# Patient Record
Sex: Female | Born: 1935 | Race: White | Hispanic: No | Smoking: Never smoker
Health system: Southern US, Community
[De-identification: ages and names within clinical notes are randomized; demographics above are authoritative.]

## PROBLEM LIST (undated history)

## (undated) DIAGNOSIS — M8580 Other specified disorders of bone density and structure, unspecified site: Secondary | ICD-10-CM

## (undated) DIAGNOSIS — R112 Nausea with vomiting, unspecified: Secondary | ICD-10-CM

## (undated) DIAGNOSIS — Z9889 Other specified postprocedural states: Secondary | ICD-10-CM

## (undated) DIAGNOSIS — E785 Hyperlipidemia, unspecified: Secondary | ICD-10-CM

## (undated) DIAGNOSIS — I10 Essential (primary) hypertension: Secondary | ICD-10-CM

## (undated) DIAGNOSIS — T8859XA Other complications of anesthesia, initial encounter: Secondary | ICD-10-CM

## (undated) DIAGNOSIS — R600 Localized edema: Secondary | ICD-10-CM

## (undated) DIAGNOSIS — M1990 Unspecified osteoarthritis, unspecified site: Secondary | ICD-10-CM

## (undated) DIAGNOSIS — R569 Unspecified convulsions: Secondary | ICD-10-CM

## (undated) DIAGNOSIS — K219 Gastro-esophageal reflux disease without esophagitis: Secondary | ICD-10-CM

## (undated) DIAGNOSIS — M722 Plantar fascial fibromatosis: Secondary | ICD-10-CM

## (undated) DIAGNOSIS — G8929 Other chronic pain: Secondary | ICD-10-CM

## (undated) DIAGNOSIS — M79673 Pain in unspecified foot: Secondary | ICD-10-CM

## (undated) DIAGNOSIS — N951 Menopausal and female climacteric states: Secondary | ICD-10-CM

## (undated) DIAGNOSIS — S82402A Unspecified fracture of shaft of left fibula, initial encounter for closed fracture: Secondary | ICD-10-CM

## (undated) DIAGNOSIS — T4145XA Adverse effect of unspecified anesthetic, initial encounter: Secondary | ICD-10-CM

## (undated) DIAGNOSIS — IMO0002 Reserved for concepts with insufficient information to code with codable children: Secondary | ICD-10-CM

## (undated) HISTORY — DX: Pain in unspecified foot: M79.673

## (undated) HISTORY — DX: Other chronic pain: G89.29

## (undated) HISTORY — PX: BACK SURGERY: SHX140

## (undated) HISTORY — DX: Other specified disorders of bone density and structure, unspecified site: M85.80

## (undated) HISTORY — DX: Hyperlipidemia, unspecified: E78.5

## (undated) HISTORY — PX: OTHER SURGICAL HISTORY: SHX169

## (undated) HISTORY — DX: Essential (primary) hypertension: I10

## (undated) HISTORY — PX: BLADDER SUSPENSION: SHX72

## (undated) HISTORY — DX: Reserved for concepts with insufficient information to code with codable children: IMO0002

## (undated) HISTORY — DX: Localized edema: R60.0

## (undated) HISTORY — DX: Unspecified convulsions: R56.9

## (undated) HISTORY — DX: Plantar fascial fibromatosis: M72.2

## (undated) HISTORY — DX: Unspecified fracture of shaft of left fibula, initial encounter for closed fracture: S82.402A

## (undated) HISTORY — DX: Menopausal and female climacteric states: N95.1

## (undated) HISTORY — DX: Unspecified osteoarthritis, unspecified site: M19.90

---

## 1968-05-31 HISTORY — PX: PARTIAL HYSTERECTOMY: SHX80

## 1992-05-31 DIAGNOSIS — E785 Hyperlipidemia, unspecified: Secondary | ICD-10-CM

## 1992-05-31 HISTORY — DX: Hyperlipidemia, unspecified: E78.5

## 1998-03-31 LAB — HM COLONOSCOPY: HM Colonoscopy: NEGATIVE

## 1999-02-10 ENCOUNTER — Other Ambulatory Visit: Admission: RE | Admit: 1999-02-10 | Discharge: 1999-02-10 | Payer: Self-pay | Admitting: Family Medicine

## 1999-06-01 DIAGNOSIS — IMO0002 Reserved for concepts with insufficient information to code with codable children: Secondary | ICD-10-CM

## 1999-06-01 HISTORY — DX: Reserved for concepts with insufficient information to code with codable children: IMO0002

## 1999-06-26 ENCOUNTER — Encounter: Payer: Self-pay | Admitting: Urology

## 1999-06-30 HISTORY — PX: OTHER SURGICAL HISTORY: SHX169

## 1999-07-01 ENCOUNTER — Inpatient Hospital Stay (HOSPITAL_COMMUNITY): Admission: RE | Admit: 1999-07-01 | Discharge: 1999-07-04 | Payer: Self-pay | Admitting: Urology

## 1999-07-01 ENCOUNTER — Encounter (INDEPENDENT_AMBULATORY_CARE_PROVIDER_SITE_OTHER): Payer: Self-pay | Admitting: Specialist

## 1999-08-06 ENCOUNTER — Encounter: Admission: RE | Admit: 1999-08-06 | Discharge: 1999-08-06 | Payer: Self-pay | Admitting: Family Medicine

## 1999-08-06 ENCOUNTER — Encounter: Payer: Self-pay | Admitting: Family Medicine

## 1999-09-20 ENCOUNTER — Emergency Department (HOSPITAL_COMMUNITY): Admission: EM | Admit: 1999-09-20 | Discharge: 1999-09-21 | Payer: Self-pay

## 1999-11-29 DIAGNOSIS — M899 Disorder of bone, unspecified: Secondary | ICD-10-CM | POA: Insufficient documentation

## 1999-11-29 DIAGNOSIS — M949 Disorder of cartilage, unspecified: Secondary | ICD-10-CM

## 1999-11-29 DIAGNOSIS — M8580 Other specified disorders of bone density and structure, unspecified site: Secondary | ICD-10-CM

## 1999-11-29 HISTORY — DX: Other specified disorders of bone density and structure, unspecified site: M85.80

## 2000-05-31 DIAGNOSIS — I10 Essential (primary) hypertension: Secondary | ICD-10-CM

## 2000-05-31 HISTORY — DX: Essential (primary) hypertension: I10

## 2000-08-09 ENCOUNTER — Encounter: Payer: Self-pay | Admitting: Family Medicine

## 2000-08-09 ENCOUNTER — Encounter: Admission: RE | Admit: 2000-08-09 | Discharge: 2000-08-09 | Payer: Self-pay | Admitting: Family Medicine

## 2001-08-01 ENCOUNTER — Encounter: Payer: Self-pay | Admitting: Family Medicine

## 2001-08-01 ENCOUNTER — Encounter: Admission: RE | Admit: 2001-08-01 | Discharge: 2001-08-01 | Payer: Self-pay | Admitting: Family Medicine

## 2002-08-21 ENCOUNTER — Encounter: Admission: RE | Admit: 2002-08-21 | Discharge: 2002-08-21 | Payer: Self-pay | Admitting: Family Medicine

## 2002-08-21 ENCOUNTER — Encounter: Payer: Self-pay | Admitting: Family Medicine

## 2003-08-26 ENCOUNTER — Encounter: Admission: RE | Admit: 2003-08-26 | Discharge: 2003-08-26 | Payer: Self-pay | Admitting: Family Medicine

## 2004-03-31 ENCOUNTER — Encounter: Payer: Self-pay | Admitting: Family Medicine

## 2004-04-03 ENCOUNTER — Ambulatory Visit: Payer: Self-pay | Admitting: Family Medicine

## 2004-04-06 ENCOUNTER — Ambulatory Visit: Payer: Self-pay | Admitting: Family Medicine

## 2004-04-06 ENCOUNTER — Other Ambulatory Visit: Admission: RE | Admit: 2004-04-06 | Discharge: 2004-04-06 | Payer: Self-pay | Admitting: Family Medicine

## 2004-04-22 ENCOUNTER — Ambulatory Visit: Payer: Self-pay | Admitting: Family Medicine

## 2004-05-04 ENCOUNTER — Ambulatory Visit: Payer: Self-pay | Admitting: Internal Medicine

## 2004-05-13 ENCOUNTER — Ambulatory Visit: Payer: Self-pay | Admitting: Family Medicine

## 2004-05-31 LAB — HM DEXA SCAN

## 2004-08-12 ENCOUNTER — Ambulatory Visit: Payer: Self-pay | Admitting: Family Medicine

## 2004-09-07 ENCOUNTER — Encounter: Admission: RE | Admit: 2004-09-07 | Discharge: 2004-09-07 | Payer: Self-pay | Admitting: Family Medicine

## 2004-09-08 ENCOUNTER — Ambulatory Visit: Payer: Self-pay | Admitting: Family Medicine

## 2004-09-18 ENCOUNTER — Encounter: Admission: RE | Admit: 2004-09-18 | Discharge: 2004-09-18 | Payer: Self-pay | Admitting: Family Medicine

## 2005-03-31 ENCOUNTER — Ambulatory Visit: Payer: Self-pay | Admitting: Family Medicine

## 2005-06-10 ENCOUNTER — Ambulatory Visit: Payer: Self-pay | Admitting: Family Medicine

## 2005-07-20 ENCOUNTER — Ambulatory Visit: Payer: Self-pay | Admitting: Family Medicine

## 2005-09-13 ENCOUNTER — Encounter: Admission: RE | Admit: 2005-09-13 | Discharge: 2005-09-13 | Payer: Self-pay | Admitting: Family Medicine

## 2005-09-17 ENCOUNTER — Ambulatory Visit: Payer: Self-pay | Admitting: Family Medicine

## 2005-09-27 ENCOUNTER — Ambulatory Visit: Payer: Self-pay | Admitting: Family Medicine

## 2005-10-05 ENCOUNTER — Ambulatory Visit: Payer: Self-pay | Admitting: Family Medicine

## 2005-10-11 ENCOUNTER — Encounter: Admission: RE | Admit: 2005-10-11 | Discharge: 2005-10-11 | Payer: Self-pay | Admitting: Family Medicine

## 2005-11-01 ENCOUNTER — Ambulatory Visit: Payer: Self-pay | Admitting: Family Medicine

## 2006-03-01 ENCOUNTER — Ambulatory Visit: Payer: Self-pay | Admitting: Family Medicine

## 2006-06-17 ENCOUNTER — Ambulatory Visit: Payer: Self-pay | Admitting: Family Medicine

## 2006-07-11 ENCOUNTER — Ambulatory Visit: Payer: Self-pay | Admitting: Family Medicine

## 2006-07-18 ENCOUNTER — Ambulatory Visit: Payer: Self-pay | Admitting: Family Medicine

## 2006-07-19 ENCOUNTER — Ambulatory Visit: Payer: Self-pay | Admitting: Internal Medicine

## 2006-07-20 ENCOUNTER — Ambulatory Visit: Payer: Self-pay | Admitting: Cardiovascular Disease

## 2006-08-01 ENCOUNTER — Other Ambulatory Visit: Admission: RE | Admit: 2006-08-01 | Discharge: 2006-08-01 | Payer: Self-pay | Admitting: Obstetrics and Gynecology

## 2006-08-31 ENCOUNTER — Ambulatory Visit: Payer: Self-pay | Admitting: Family Medicine

## 2006-09-09 ENCOUNTER — Encounter: Payer: Self-pay | Admitting: Family Medicine

## 2006-09-12 DIAGNOSIS — K589 Irritable bowel syndrome without diarrhea: Secondary | ICD-10-CM | POA: Insufficient documentation

## 2006-09-12 DIAGNOSIS — Z9079 Acquired absence of other genital organ(s): Secondary | ICD-10-CM | POA: Insufficient documentation

## 2006-09-12 DIAGNOSIS — M1990 Unspecified osteoarthritis, unspecified site: Secondary | ICD-10-CM | POA: Insufficient documentation

## 2006-09-12 DIAGNOSIS — Z8719 Personal history of other diseases of the digestive system: Secondary | ICD-10-CM | POA: Insufficient documentation

## 2006-09-12 DIAGNOSIS — N6019 Diffuse cystic mastopathy of unspecified breast: Secondary | ICD-10-CM | POA: Insufficient documentation

## 2006-09-12 DIAGNOSIS — J301 Allergic rhinitis due to pollen: Secondary | ICD-10-CM

## 2006-09-19 ENCOUNTER — Ambulatory Visit: Payer: Self-pay | Admitting: Family Medicine

## 2006-09-29 ENCOUNTER — Encounter: Admission: RE | Admit: 2006-09-29 | Discharge: 2006-09-29 | Payer: Self-pay | Admitting: Family Medicine

## 2006-09-30 ENCOUNTER — Encounter: Payer: Self-pay | Admitting: Family Medicine

## 2006-10-05 ENCOUNTER — Encounter: Payer: Self-pay | Admitting: Family Medicine

## 2006-10-17 ENCOUNTER — Encounter: Payer: Self-pay | Admitting: Family Medicine

## 2006-12-05 ENCOUNTER — Encounter: Payer: Self-pay | Admitting: Family Medicine

## 2007-01-06 ENCOUNTER — Telehealth: Payer: Self-pay | Admitting: Family Medicine

## 2007-02-21 ENCOUNTER — Encounter: Payer: Self-pay | Admitting: Family Medicine

## 2007-03-06 ENCOUNTER — Ambulatory Visit: Payer: Self-pay | Admitting: Family Medicine

## 2007-03-06 DIAGNOSIS — R4589 Other symptoms and signs involving emotional state: Secondary | ICD-10-CM

## 2007-03-06 DIAGNOSIS — N9489 Other specified conditions associated with female genital organs and menstrual cycle: Secondary | ICD-10-CM

## 2007-03-06 DIAGNOSIS — M79609 Pain in unspecified limb: Secondary | ICD-10-CM

## 2007-03-09 ENCOUNTER — Encounter: Payer: Self-pay | Admitting: Family Medicine

## 2007-03-29 ENCOUNTER — Ambulatory Visit: Payer: Self-pay | Admitting: Obstetrics & Gynecology

## 2007-04-03 ENCOUNTER — Ambulatory Visit (HOSPITAL_COMMUNITY): Admission: RE | Admit: 2007-04-03 | Discharge: 2007-04-03 | Payer: Self-pay | Admitting: Gynecology

## 2007-04-17 ENCOUNTER — Ambulatory Visit: Payer: Self-pay | Admitting: Gynecology

## 2007-04-18 ENCOUNTER — Ambulatory Visit: Payer: Self-pay | Admitting: Family Medicine

## 2007-05-15 ENCOUNTER — Telehealth: Payer: Self-pay | Admitting: Family Medicine

## 2007-05-19 ENCOUNTER — Ambulatory Visit: Admission: RE | Admit: 2007-05-19 | Discharge: 2007-05-19 | Payer: Self-pay | Admitting: Gynecology

## 2007-05-30 ENCOUNTER — Telehealth: Payer: Self-pay | Admitting: Family Medicine

## 2007-06-06 ENCOUNTER — Ambulatory Visit: Payer: Self-pay | Admitting: Family Medicine

## 2007-06-07 ENCOUNTER — Telehealth: Payer: Self-pay | Admitting: Family Medicine

## 2007-06-09 LAB — CONVERTED CEMR LAB
BUN: 21 mg/dL (ref 6–23)
CO2: 30 meq/L (ref 19–32)
Calcium: 9.5 mg/dL (ref 8.4–10.5)
Chloride: 99 meq/L (ref 96–112)
Cholesterol: 435 mg/dL (ref 0–200)
Direct LDL: 324.8 mg/dL
Eosinophils Absolute: 0 10*3/uL (ref 0.0–0.6)
GFR calc Af Amer: 79 mL/min
GFR calc non Af Amer: 65 mL/min
Glucose, Bld: 107 mg/dL — ABNORMAL HIGH (ref 70–99)
HDL: 60.8 mg/dL (ref >39.0)
Lymphocytes Relative: 21.4 % (ref 12.0–46.0)
MCV: 94.4 fL (ref 78.0–100.0)
Monocytes Absolute: 0.7 10*3/uL (ref 0.2–0.7)
Monocytes Relative: 6.4 % (ref 3.0–11.0)
Phosphorus: 3.6 mg/dL (ref 2.3–4.6)
Platelets: 346 10*3/uL (ref 150–400)
Potassium: 3.8 meq/L (ref 3.5–5.1)
RBC: 4.36 M/uL (ref 3.87–5.11)
RDW: 12.7 % (ref 11.5–14.6)
Sodium: 137 meq/L (ref 135–145)
Triglycerides: 196 mg/dL — ABNORMAL HIGH (ref 0–149)
VLDL: 39 mg/dL (ref 0–40)

## 2007-06-18 ENCOUNTER — Emergency Department (HOSPITAL_COMMUNITY): Admission: EM | Admit: 2007-06-18 | Discharge: 2007-06-18 | Payer: Self-pay | Admitting: Emergency Medicine

## 2007-06-21 ENCOUNTER — Ambulatory Visit: Payer: Self-pay | Admitting: Family Medicine

## 2007-06-21 DIAGNOSIS — F329 Major depressive disorder, single episode, unspecified: Secondary | ICD-10-CM

## 2007-06-21 DIAGNOSIS — F419 Anxiety disorder, unspecified: Secondary | ICD-10-CM

## 2007-06-22 LAB — CONVERTED CEMR LAB
Basophils Absolute: 0 10*3/uL (ref 0.0–0.1)
Basophils Relative: 0.3 % (ref 0.0–1.0)
Calcium: 9.1 mg/dL (ref 8.4–10.5)
Eosinophils Absolute: 0.1 10*3/uL (ref 0.0–0.6)
Eosinophils Relative: 1.4 % (ref 0.0–5.0)
GFR calc non Af Amer: 58 mL/min
Glucose, Bld: 97 mg/dL (ref 70–99)
Hemoglobin: 14.7 g/dL (ref 12.0–15.0)
Lymphocytes Relative: 26.7 % (ref 12.0–46.0)
MCHC: 35.2 g/dL (ref 30.0–36.0)
Monocytes Absolute: 0.6 10*3/uL (ref 0.2–0.7)
Neutro Abs: 4.4 10*3/uL (ref 1.4–7.7)
Neutrophils Relative %: 62.4 % (ref 43.0–77.0)
Potassium: 3.2 meq/L — ABNORMAL LOW (ref 3.5–5.1)
WBC: 7 10*3/uL (ref 4.5–10.5)

## 2007-07-04 ENCOUNTER — Ambulatory Visit: Payer: Self-pay | Admitting: Family Medicine

## 2007-07-19 ENCOUNTER — Ambulatory Visit: Payer: Self-pay | Admitting: Family Medicine

## 2007-07-19 DIAGNOSIS — R609 Edema, unspecified: Secondary | ICD-10-CM

## 2007-07-20 LAB — CONVERTED CEMR LAB
ALT: 18 units/L (ref 0–35)
Albumin: 3.6 g/dL (ref 3.5–5.2)
Alkaline Phosphatase: 48 units/L (ref 39–117)
BUN: 15 mg/dL (ref 6–23)
Bilirubin, Direct: 0.1 mg/dL (ref 0.0–0.3)
Calcium: 9.5 mg/dL (ref 8.4–10.5)
GFR calc Af Amer: 70 mL/min
Phosphorus: 3.4 mg/dL (ref 2.3–4.6)
Potassium: 3.6 meq/L (ref 3.5–5.1)
Total Bilirubin: 0.5 mg/dL (ref 0.3–1.2)
Total Protein: 6.7 g/dL (ref 6.0–8.3)

## 2007-08-09 ENCOUNTER — Telehealth: Payer: Self-pay | Admitting: Family Medicine

## 2007-08-24 ENCOUNTER — Ambulatory Visit: Payer: Self-pay | Admitting: Family Medicine

## 2007-08-30 ENCOUNTER — Encounter (INDEPENDENT_AMBULATORY_CARE_PROVIDER_SITE_OTHER): Payer: Self-pay | Admitting: *Deleted

## 2007-08-30 ENCOUNTER — Encounter: Payer: Self-pay | Admitting: Family Medicine

## 2007-08-30 LAB — CONVERTED CEMR LAB
AST: 21 units/L (ref 0–37)
Cholesterol: 249 mg/dL (ref 0–200)
Direct LDL: 167.9 mg/dL
HDL: 53.6 mg/dL (ref >39.0)
Potassium: 3.2 meq/L — ABNORMAL LOW (ref 3.5–5.1)

## 2007-09-06 ENCOUNTER — Telehealth: Payer: Self-pay | Admitting: Family Medicine

## 2007-09-14 ENCOUNTER — Ambulatory Visit: Payer: Self-pay | Admitting: Family Medicine

## 2007-09-18 ENCOUNTER — Encounter: Payer: Self-pay | Admitting: Family Medicine

## 2007-09-20 ENCOUNTER — Encounter: Payer: Self-pay | Admitting: Family Medicine

## 2007-09-27 ENCOUNTER — Ambulatory Visit: Payer: Self-pay | Admitting: Family Medicine

## 2007-10-02 ENCOUNTER — Encounter: Admission: RE | Admit: 2007-10-02 | Discharge: 2007-10-02 | Payer: Self-pay | Admitting: Family Medicine

## 2007-10-04 ENCOUNTER — Encounter (INDEPENDENT_AMBULATORY_CARE_PROVIDER_SITE_OTHER): Payer: Self-pay | Admitting: *Deleted

## 2007-10-09 ENCOUNTER — Telehealth: Payer: Self-pay | Admitting: Family Medicine

## 2007-10-11 ENCOUNTER — Encounter: Admission: RE | Admit: 2007-10-11 | Discharge: 2007-10-11 | Payer: Self-pay | Admitting: Family Medicine

## 2007-10-16 ENCOUNTER — Ambulatory Visit: Payer: Self-pay | Admitting: Family Medicine

## 2007-10-19 LAB — CONVERTED CEMR LAB
AST: 26 units/L (ref 0–37)
Cholesterol: 261 mg/dL (ref 0–200)
Direct LDL: 170.6 mg/dL
HDL: 57.1 mg/dL (ref >39.0)
Potassium: 3.6 meq/L (ref 3.5–5.1)
Total CHOL/HDL Ratio: 4.6
Triglycerides: 203 mg/dL (ref 0–149)

## 2007-10-26 ENCOUNTER — Ambulatory Visit: Payer: Self-pay | Admitting: Family Medicine

## 2008-01-22 ENCOUNTER — Ambulatory Visit: Payer: Self-pay | Admitting: Family Medicine

## 2008-01-22 DIAGNOSIS — N951 Menopausal and female climacteric states: Secondary | ICD-10-CM

## 2008-03-04 ENCOUNTER — Ambulatory Visit: Payer: Self-pay | Admitting: Family Medicine

## 2008-04-09 ENCOUNTER — Telehealth (INDEPENDENT_AMBULATORY_CARE_PROVIDER_SITE_OTHER): Payer: Self-pay | Admitting: *Deleted

## 2008-04-17 ENCOUNTER — Ambulatory Visit: Payer: Self-pay | Admitting: Family Medicine

## 2008-04-18 LAB — CONVERTED CEMR LAB
AST: 31 units/L (ref 0–37)
Cholesterol: 259 mg/dL (ref 0–200)
VLDL: 26 mg/dL (ref 0–40)

## 2008-04-29 ENCOUNTER — Telehealth (INDEPENDENT_AMBULATORY_CARE_PROVIDER_SITE_OTHER): Payer: Self-pay | Admitting: *Deleted

## 2008-05-28 ENCOUNTER — Telehealth: Payer: Self-pay | Admitting: Family Medicine

## 2008-07-02 ENCOUNTER — Ambulatory Visit: Payer: Self-pay | Admitting: Family Medicine

## 2008-10-07 ENCOUNTER — Encounter: Admission: RE | Admit: 2008-10-07 | Discharge: 2008-10-07 | Payer: Self-pay | Admitting: Family Medicine

## 2008-10-08 ENCOUNTER — Ambulatory Visit: Payer: Self-pay | Admitting: Family Medicine

## 2008-10-08 DIAGNOSIS — E876 Hypokalemia: Secondary | ICD-10-CM

## 2008-10-09 ENCOUNTER — Encounter: Payer: Self-pay | Admitting: Family Medicine

## 2008-10-12 ENCOUNTER — Encounter (INDEPENDENT_AMBULATORY_CARE_PROVIDER_SITE_OTHER): Payer: Self-pay | Admitting: *Deleted

## 2008-10-14 LAB — CONVERTED CEMR LAB
Albumin: 3.5 g/dL (ref 3.5–5.2)
BUN: 19 mg/dL (ref 6–23)
Calcium: 9.5 mg/dL (ref 8.4–10.5)
Phosphorus: 4.2 mg/dL (ref 2.3–4.6)
Sodium: 143 meq/L (ref 135–145)

## 2009-02-22 ENCOUNTER — Telehealth: Payer: Self-pay | Admitting: Family Medicine

## 2009-03-04 ENCOUNTER — Ambulatory Visit: Payer: Self-pay | Admitting: Family Medicine

## 2009-03-05 ENCOUNTER — Telehealth: Payer: Self-pay | Admitting: Family Medicine

## 2009-03-12 ENCOUNTER — Ambulatory Visit: Payer: Self-pay | Admitting: Family Medicine

## 2009-03-12 DIAGNOSIS — R5381 Other malaise: Secondary | ICD-10-CM | POA: Insufficient documentation

## 2009-03-12 DIAGNOSIS — R5383 Other fatigue: Secondary | ICD-10-CM

## 2009-03-12 LAB — CONVERTED CEMR LAB
Ketones, urine, test strip: NEGATIVE
Nitrite: NEGATIVE
Specific Gravity, Urine: 1.015
Urobilinogen, UA: 0.2
pH: 6

## 2009-03-13 ENCOUNTER — Encounter: Payer: Self-pay | Admitting: Family Medicine

## 2009-03-13 LAB — CONVERTED CEMR LAB
Basophils Relative: 0.3 % (ref 0.0–3.0)
Calcium: 9.4 mg/dL (ref 8.4–10.5)
Creatinine, Ser: 0.9 mg/dL (ref 0.4–1.2)
Eosinophils Relative: 6.7 % — ABNORMAL HIGH (ref 0.0–5.0)
GFR calc non Af Amer: 64.71 mL/min (ref >60)
Glucose, Bld: 96 mg/dL (ref 70–99)
Hemoglobin: 12.3 g/dL (ref 12.0–15.0)
Lymphocytes Relative: 28.1 % (ref 12.0–46.0)
Monocytes Relative: 4 % (ref 3.0–12.0)
Neutrophils Relative %: 60.9 % (ref 43.0–77.0)
Platelets: 366 10*3/uL (ref 150.0–400.0)
RBC: 3.89 M/uL (ref 3.87–5.11)
TSH: 1.8 microintl units/mL (ref 0.35–5.50)
WBC: 8.8 10*3/uL (ref 4.5–10.5)

## 2009-04-03 ENCOUNTER — Telehealth: Payer: Self-pay | Admitting: Family Medicine

## 2009-04-21 ENCOUNTER — Ambulatory Visit: Payer: Self-pay | Admitting: Family Medicine

## 2009-05-29 ENCOUNTER — Telehealth: Payer: Self-pay | Admitting: Family Medicine

## 2009-05-29 ENCOUNTER — Ambulatory Visit: Payer: Self-pay | Admitting: Family Medicine

## 2009-06-18 ENCOUNTER — Encounter: Payer: Self-pay | Admitting: Family Medicine

## 2009-06-18 ENCOUNTER — Ambulatory Visit: Payer: Self-pay | Admitting: Family Medicine

## 2009-06-18 DIAGNOSIS — K219 Gastro-esophageal reflux disease without esophagitis: Secondary | ICD-10-CM

## 2009-06-18 DIAGNOSIS — N3941 Urge incontinence: Secondary | ICD-10-CM

## 2009-06-18 LAB — CONVERTED CEMR LAB
Bilirubin Urine: NEGATIVE
Glucose, Urine, Semiquant: NEGATIVE
Ketones, urine, test strip: NEGATIVE
Protein, U semiquant: NEGATIVE
Specific Gravity, Urine: <1.005
Urobilinogen, UA: 0.2

## 2009-06-19 LAB — CONVERTED CEMR LAB
ALT: 17 units/L (ref 0–35)
AST: 19 units/L (ref 0–37)
Alkaline Phosphatase: 53 units/L (ref 39–117)
Basophils Absolute: 0 10*3/uL (ref 0.0–0.1)
Bilirubin, Direct: 0.1 mg/dL (ref 0.0–0.3)
Chloride: 100 meq/L (ref 96–112)
Creatinine, Ser: 1.1 mg/dL (ref 0.4–1.2)
Eosinophils Absolute: 0.3 10*3/uL (ref 0.0–0.7)
Eosinophils Relative: 2.7 % (ref 0.0–5.0)
HCT: 38.2 % (ref 36.0–46.0)
Hemoglobin: 12.7 g/dL (ref 12.0–15.0)
Lymphs Abs: 0.5 10*3/uL — ABNORMAL LOW (ref 0.7–4.0)
MCHC: 33.2 g/dL (ref 30.0–36.0)
Monocytes Absolute: 0.3 10*3/uL (ref 0.1–1.0)
Monocytes Relative: 2.9 % — ABNORMAL LOW (ref 3.0–12.0)
RBC: 3.97 M/uL (ref 3.87–5.11)
RDW: 13.3 % (ref 11.5–14.6)
TSH: 4.2 microintl units/mL (ref 0.35–5.50)
Total Bilirubin: 0.8 mg/dL (ref 0.3–1.2)
WBC: 11.8 10*3/uL — ABNORMAL HIGH (ref 4.5–10.5)

## 2009-07-09 ENCOUNTER — Ambulatory Visit: Payer: Self-pay | Admitting: Family Medicine

## 2009-08-22 ENCOUNTER — Ambulatory Visit: Payer: Self-pay | Admitting: Family Medicine

## 2009-08-25 ENCOUNTER — Telehealth: Payer: Self-pay | Admitting: Family Medicine

## 2009-08-27 ENCOUNTER — Encounter: Payer: Self-pay | Admitting: Family Medicine

## 2009-09-24 ENCOUNTER — Telehealth: Payer: Self-pay | Admitting: Family Medicine

## 2009-10-02 ENCOUNTER — Ambulatory Visit: Payer: Self-pay | Admitting: Family Medicine

## 2009-10-02 DIAGNOSIS — R61 Generalized hyperhidrosis: Secondary | ICD-10-CM

## 2009-10-02 DIAGNOSIS — R319 Hematuria, unspecified: Secondary | ICD-10-CM

## 2009-10-02 LAB — CONVERTED CEMR LAB
Specific Gravity, Urine: 1.01
Urobilinogen, UA: 0.2
WBC, UA: 1 cells/hpf

## 2009-10-03 ENCOUNTER — Encounter: Payer: Self-pay | Admitting: Family Medicine

## 2009-10-06 LAB — CONVERTED CEMR LAB
Albumin: 4.1 g/dL (ref 3.5–5.2)
BUN: 22 mg/dL (ref 6–23)
CO2: 35 meq/L — ABNORMAL HIGH (ref 19–32)
Calcium: 9.7 mg/dL (ref 8.4–10.5)
Chloride: 98 meq/L (ref 96–112)
Eosinophils Absolute: 0.2 10*3/uL (ref 0.0–0.7)
Eosinophils Relative: 3.4 % (ref 0.0–5.0)
GFR calc non Af Amer: 67.19 mL/min (ref >60)
HCT: 40.9 % (ref 36.0–46.0)
Lymphs Abs: 1.9 10*3/uL (ref 0.7–4.0)
Monocytes Absolute: 0.5 10*3/uL (ref 0.1–1.0)
Neutro Abs: 3.4 10*3/uL (ref 1.4–7.7)
Neutrophils Relative %: 56.2 % (ref 43.0–77.0)
Potassium: 4 meq/L (ref 3.5–5.1)
WBC: 6.1 10*3/uL (ref 4.5–10.5)

## 2009-10-10 ENCOUNTER — Ambulatory Visit: Payer: Self-pay | Admitting: Family Medicine

## 2009-10-13 ENCOUNTER — Encounter: Payer: Self-pay | Admitting: Family Medicine

## 2009-10-13 ENCOUNTER — Encounter: Admission: RE | Admit: 2009-10-13 | Discharge: 2009-10-13 | Payer: Self-pay | Admitting: Family Medicine

## 2009-10-21 ENCOUNTER — Encounter: Payer: Self-pay | Admitting: Family Medicine

## 2009-10-21 LAB — HM MAMMOGRAPHY: HM Mammogram: NORMAL

## 2009-10-30 ENCOUNTER — Encounter (INDEPENDENT_AMBULATORY_CARE_PROVIDER_SITE_OTHER): Payer: Self-pay | Admitting: *Deleted

## 2009-10-30 ENCOUNTER — Telehealth: Payer: Self-pay | Admitting: Family Medicine

## 2010-02-25 ENCOUNTER — Ambulatory Visit: Payer: Self-pay | Admitting: Family Medicine

## 2010-03-10 ENCOUNTER — Ambulatory Visit: Payer: Self-pay | Admitting: Family Medicine

## 2010-03-14 LAB — CONVERTED CEMR LAB
AST: 24 units/L (ref 0–37)
Albumin: 3.8 g/dL (ref 3.5–5.2)
Alkaline Phosphatase: 59 units/L (ref 39–117)
BUN: 20 mg/dL (ref 6–23)
Basophils Absolute: 0 10*3/uL (ref 0.0–0.1)
CO2: 33 meq/L — ABNORMAL HIGH (ref 19–32)
Chloride: 95 meq/L — ABNORMAL LOW (ref 96–112)
Eosinophils Relative: 1.7 % (ref 0.0–5.0)
Glucose, Bld: 107 mg/dL — ABNORMAL HIGH (ref 70–99)
HCT: 39.4 % (ref 36.0–46.0)
Lymphocytes Relative: 28.9 % (ref 12.0–46.0)
Platelets: 291 10*3/uL (ref 150.0–400.0)
Potassium: 4.1 meq/L (ref 3.5–5.1)
RBC: 4.18 M/uL (ref 3.87–5.11)
RDW: 13.6 % (ref 11.5–14.6)
Sodium: 135 meq/L (ref 135–145)
TSH: 1.5 microintl units/mL (ref 0.35–5.50)
WBC: 7.1 10*3/uL (ref 4.5–10.5)

## 2010-03-16 ENCOUNTER — Ambulatory Visit: Payer: Self-pay | Admitting: Family Medicine

## 2010-05-12 ENCOUNTER — Ambulatory Visit: Payer: Self-pay | Admitting: Family Medicine

## 2010-05-13 ENCOUNTER — Ambulatory Visit: Payer: Self-pay | Admitting: Family Medicine

## 2010-05-13 DIAGNOSIS — J019 Acute sinusitis, unspecified: Secondary | ICD-10-CM

## 2010-06-30 NOTE — Miscellaneous (Signed)
   Clinical Lists Changes  Observations: Added new observation of MAMMO DUE: 10/22/2010 (10/21/2009 9:44) Added new observation of MAMMOGRAM: Normal (10/21/2009 9:44)

## 2010-06-30 NOTE — Assessment & Plan Note (Signed)
Summary: VOMITING and DIATHREA / LFW   Vital Signs:  Patient profile:   77 year old female Height:      62 inches Weight:      146.25 pounds BMI:     26.85 Temp:     98.6 degrees F oral Pulse rate:   72 / minute Pulse rhythm:   regular BP sitting:   128 / 70  (left arm) Cuff size:   regular  Vitals Entered By: Lewanda Rife LPN (March 10, 2010 12:27 PM) CC: vomiting and diarrhea on 03/06/10 and 03/07/10. No vomiting or diarrhea today.   History of Present Illness: a lot of stress this summer her best friend died suddenly, and another loss too,  and her husband was  sick (he is better)  vomiting and diarrhea on 10/7 and 10/8 -- is better now  is eating banannas and applesauce  that is better   has not felt good all summer is  ? possibly depressed  does make herself get out   is still having hot flashes  is sweating and tired in general     Allergies: 1)  ! Sulfa 2)  ! * Antiinflamatory 3)  ! Zocor 4)  ! Mevacor 5)  ! Norvasc 6)  ! Zoloft 7)  ! * Latex  Past History:  Past Medical History: Last updated: 10/10/2009 Hyperlipidemia (05/31/1992) Hypertension (05/31/2000) Osteopenia (11/29/1999) Osteoarthritis pedal edema (worsened by norvasc) chronic foot pain, mortons neuroma  plantar fasciitis  menopausal vasomotor symptoms - severe   podiatry - [facility]  ortho- Dr Cleophas Dunker  Past Surgical History: Last updated: 09/27/2007 HYST., PARTIAL (ENDOMETRIOSIS) 1970 FOOT PROBLEMS (NEUROMAS) COLONOSCOPY NEG. 11/99, ?1980 CYSTOCELE NEG. SX 1.01 ANTERIOR REPAIR W/ SURG PROC. RSO, 1.31.01 HEMORRHOIDS DEXA OSTEOPENIA, MIXED 1.06 fx in L fibula-? 2003 CT- MASS RIGHT ADNEXA, [place] by gyn-observation  Family History: Last updated: 07/02/2008 brother DM times 2 3 sisters with DM brother - died of DM 1 son - anxiety - mental health problems   mother OP, DM, pacemaker father copd  mother -HTN aunt high chol  lots of copd in family  Social  History: Last updated: 01/22/2008 note, allergic to Td married non smoker does some volunteer visits to nsg homes   Risk Factors: Smoking Status: never (09/09/2006)  Review of Systems General:  Complains of fatigue, malaise, and sweats; denies loss of appetite and sleep disorder. Eyes:  Denies blurring and eye irritation. CV:  Denies chest pain or discomfort and palpitations. Resp:  Denies cough, shortness of breath, and wheezing. GI:  Denies abdominal pain, change in bowel habits, and indigestion. GU:  Denies dysuria and urinary frequency. Derm:  Denies itching, lesion(s), poor wound healing, and rash. Neuro:  Denies numbness and tingling. Endo:  Denies excessive thirst and excessive urination. Heme:  Denies abnormal bruising and bleeding.  Physical Exam  General:  fatigued but generally well appearing  Head:  normocephalic, atraumatic, and no abnormalities observed.   Eyes:  vision grossly intact, pupils equal, pupils round, and pupils reactive to light.  no conjunctival pallor, injection or icterus  Mouth:  pharynx pink and moist.   Neck:  supple with full rom and no masses or thyromegally, no JVD or carotid bruit  Lungs:  Normal respiratory effort, chest expands symmetrically. Lungs are clear to auscultation, no crackles or wheezes. Heart:  Normal rate and regular rhythm. S1 and S2 normal without gallop, murmur, click, rub or other extra sounds. Abdomen:  Bowel sounds positive,abdomen soft and non-tender without  masses, organomegaly or hernias noted. no renal bruits Msk:  No deformity or scoliosis noted of thoracic or lumbar spine.  no acute joint changes  Extremities:  No clubbing, cyanosis, edema, or deformity noted with normal full range of motion of all joints.   Neurologic:  sensation intact to light touch, gait normal, and DTRs symmetrical and normal.   Skin:  Intact without suspicious lesions or rashes Cervical Nodes:  No lymphadenopathy noted Inguinal Nodes:  No  significant adenopathy Psych:  normal affect, talkative and pleasant    Impression & Recommendations:  Problem # 1:  FATIGUE (ICD-780.79) Assessment New with many stressors and losses and caring for her husband  ? if poss depression or other lab today and f/u Orders: Venipuncture (63875) TLB-BMP (Basic Metabolic Panel-BMET) (80048-METABOL) TLB-CBC Platelet - w/Differential (85025-CBCD) TLB-Hepatic/Liver Function Pnl (80076-HEPATIC) TLB-TSH (Thyroid Stimulating Hormone) (84443-TSH)  Problem # 2:  GASTROENTERITIS (ICD-558.9) Assessment: New suspect viral - at this time resolved disc slow / gradual rehydration  Complete Medication List: 1)  Hydrochlorothiazide 25 Mg Tabs (Hydrochlorothiazide) .... 2 tabs by mouth every am 2)  Potassium Chloride Cr 10 Meq Tbcr (Potassium chloride) .... Take one by mouth daily 3)  Centrum Cardio  .... Take 1 tablet by mouth once a day 4)  Calcium + D 1000mg   .... Take 1 tablet by mouth once a day 5)  Flonase 50 Mcg/act Susp (Fluticasone propionate) .Marland Kitchen.. 1 spray each nostril daily 6)  Astelin 137 Mcg/spray Soln (Azelastine hcl) .Marland Kitchen.. 1 spray each nostril daily 7)  Allergy Shots  .... Every other week 8)  Fish Oil 1200 Mg Caps (Omega-3 fatty acids) .... 2 caps by mouth daily 9)  Aspirin 81 Mg Coated  .Marland Kitchen.. 1 by mouth once daily with food 10)  Fexofenadine Hcl 180 Mg Tabs (Fexofenadine hcl) .... Take 1 tablet by mouth once a day as needed 11)  Paroxetine Hcl 10 Mg Tabs (Paroxetine hcl) .... One by mouth daily 12)  Omeprazole 20 Mg Cpdr (Omeprazole) .... One by mouth daily 13)  Cetaphil Cream With Steroids  .... As directed as needed  Patient Instructions: 1)  labs today for fatigue 2)  follow up with me in about a week to disc labs and symptoms  3)  if vomiting or diarrhea return please let me know   Current Allergies (reviewed today): ! SULFA ! * ANTIINFLAMATORY ! ZOCOR ! MEVACOR ! NORVASC ! ZOLOFT ! * LATEX

## 2010-06-30 NOTE — Miscellaneous (Signed)
Summary: TB Skin test reading   Clinical Lists Changes  Observations: Added new observation of TB PPDRESULT: negative (10/13/2009 15:07) Added new observation of PPD RESULT: < 5mm (10/13/2009 15:07) Added new observation of TB-PPD RDDTE: 10/13/2009 (10/13/2009 15:07)      PPD Results    Date of reading: 10/13/2009    Results: < 5mm    Interpretation: negative  Appended Document: TB Skin test reading please ask pt if the estrogen patch is helping her sweats at all yet  Appended Document: TB Skin test reading Left message for patient to call back.   Appended Document: TB Skin test reading Pt said she thought it was a bit early for the patch to help. Pt will call back in a few weeks an update Dr Milinda Antis on how she is doing.

## 2010-06-30 NOTE — Assessment & Plan Note (Signed)
Summary: Multiple complaints, fatigue, dysuria   Vital Signs:  Patient profile:   77 year old female Height:      62 inches Weight:      148.13 pounds BMI:     27.19 Temp:     98 degrees F oral Pulse rate:   84 / minute Pulse rhythm:   regular BP sitting:   110 / 60  (left arm) Cuff size:   regular  Vitals Entered By: Delilah Shan CMA Duncan Dull) (June 18, 2009 10:15 AM) CC: multiple complaints   History of Present Illness: 77 yo here with multiple complaints.  Has had increased fatigue over last several months.  Feels like anxiety much better since starting paxil.  No shortness of breath or chest pain. She is having some nausea and burning in her stomach at night, sometimes has morning cough. Never actually vomits, no diarrhea. Does feel like her food "might come up" sometimes. No fever, sweats, changes in weight. Does feel a decrease in appetite over last couple of weeks.  Dysuria- has had issues with urge incontinence for years.  Now has suprapubic pressure when she urinates for past several days, incontinence also worse.  Denies any back pain, fevers.  Has had nausea (see above).  No hematuria.    Current Medications (verified): 1)  Hydrochlorothiazide 25 Mg Tabs (Hydrochlorothiazide) .... 2 Tabs By Mouth Every Am 2)  Potassium Chloride Cr 10 Meq  Tbcr (Potassium Chloride) .... Take One By Mouth Daily 3)  Centrum Cardio .... Take 1 Tablet By Mouth Once A Day 4)  Calcium + D 1000mg  .... Take 1 Tablet By Mouth Once A Day 5)  Flonase 50 Mcg/act  Susp (Fluticasone Propionate) .Marland Kitchen.. 1 Spray Each Nostril Daily 6)  Astelin 137 Mcg/spray  Soln (Azelastine Hcl) .Marland Kitchen.. 1 Spray Each Nostril Daily 7)  Allergy Shots .... Every Other Week 8)  Fish Oil 1200 Mg Caps (Omega-3 Fatty Acids) .... 2 Caps By Mouth Daily 9)  Aspirin 81 Mg Coated .Marland Kitchen.. 1 By Mouth Once Daily With Food 10)  Fexofenadine Hcl 180 Mg Tabs (Fexofenadine Hcl) .... Take 1 Tablet By Mouth Once A Day As Needed 11)  Paroxetine  Hcl 10 Mg Tabs (Paroxetine Hcl) .... One By Mouth Daily 12)  Lidex 0.05 % Crea (Fluocinonide) .... Apply To Area 2-4 Times A Day Until Clears Up 13)  Macrobid 100 Mg Caps (Nitrofurantoin Monohyd Macro) .Marland Kitchen.. 1 Tab By Mouth Two Times A Day X 7 Days  Allergies: 1)  ! Sulfa 2)  ! * Antiinflamatory 3)  ! Zocor 4)  ! Mevacor 5)  ! Norvasc 6)  ! Zoloft 7)  ! * Latex  Review of Systems      See HPI General:  Complains of fatigue, loss of appetite, and malaise; denies chills and fever. CV:  Denies chest pain or discomfort. Resp:  Denies cough and shortness of breath. GI:  Complains of nausea; denies abdominal pain, bloody stools, change in bowel habits, diarrhea, and vomiting. GU:  Complains of dysuria, incontinence, nocturia, and urinary frequency; denies discharge and hematuria. Psych:  Denies anxiety, depression, easily angered, and easily tearful.  Physical Exam  General:  Well-developed,well-nourished,in no acute distress; alert,appropriate and cooperative throughout examination. Mouth:  pharynx pink and moist, no erythema, and no exudates.   Lungs:  normal respiratory effort, no crackles, and no wheezes.   Heart:  Normal rate and regular rhythm. S1 and S2 normal without gallop, murmur, click, rub or other extra sounds. Abdomen:  Bowel  sounds positive,abdomen soft and non-tender without masses, organomegaly or hernias noted. NO CVA tenderness Psych:  normal affect, talkative and pleasant    Impression & Recommendations:  Problem # 1:  FATIGUE (ICD-780.79) Assessment Deteriorated Ongoing problem.  Likely multifactorial, recent URI, stress of the holiday.  Will do extensive lab work up to rule out reversible causes. Orders: Venipuncture (16109) TLB-Hepatic/Liver Function Pnl (80076-HEPATIC) TLB-BMP (Basic Metabolic Panel-BMET) (80048-METABOL) TLB-CBC Platelet - w/Differential (85025-CBCD) TLB-Renal Function Panel (80069-RENAL) TLB-TSH (Thyroid Stimulating Hormone)  (84443-TSH)  Problem # 2:  DYSURIA (ICD-788.1) Assessment: New UA showing microscopic hematuria.  Since she is symptomatic with worseing dysuria and incontinence, will treat for UTI.  Send urine for culture.  If no bacteria, will come in for repeat UA to make sure hematuria was transient.  If hematuria persists, will send to urology. Her updated medication list for this problem includes:    Macrobid 100 Mg Caps (Nitrofurantoin monohyd macro) .Marland Kitchen... 1 tab by mouth two times a day x 7 days  Orders: UA Dipstick w/o Micro (manual) (60454) T-Culture, Urine (09811-91478)  Problem # 3:  GERD (ICD-530.81) Assessment: New Reporting symptoms consistent with GERD.  Given samples of Nexium.  See patient instructions for details.  Problem # 4:  INCONTINENCE, URGE (ICD-788.31) Assessment: Deteriorated Given sample of Detrol 4 mg daily.  Advised to call me in a few weeks to let me know how her symptoms are.  May need urology referral.  Complete Medication List: 1)  Hydrochlorothiazide 25 Mg Tabs (Hydrochlorothiazide) .... 2 tabs by mouth every am 2)  Potassium Chloride Cr 10 Meq Tbcr (Potassium chloride) .... Take one by mouth daily 3)  Centrum Cardio  .... Take 1 tablet by mouth once a day 4)  Calcium + D 1000mg   .... Take 1 tablet by mouth once a day 5)  Flonase 50 Mcg/act Susp (Fluticasone propionate) .Marland Kitchen.. 1 spray each nostril daily 6)  Astelin 137 Mcg/spray Soln (Azelastine hcl) .Marland Kitchen.. 1 spray each nostril daily 7)  Allergy Shots  .... Every other week 8)  Fish Oil 1200 Mg Caps (Omega-3 fatty acids) .... 2 caps by mouth daily 9)  Aspirin 81 Mg Coated  .Marland Kitchen.. 1 by mouth once daily with food 10)  Fexofenadine Hcl 180 Mg Tabs (Fexofenadine hcl) .... Take 1 tablet by mouth once a day as needed 11)  Paroxetine Hcl 10 Mg Tabs (Paroxetine hcl) .... One by mouth daily 12)  Lidex 0.05 % Crea (Fluocinonide) .... Apply to area 2-4 times a day until clears up 13)  Macrobid 100 Mg Caps (Nitrofurantoin monohyd  macro) .Marland Kitchen.. 1 tab by mouth two times a day x 7 days Prescriptions: MACROBID 100 MG CAPS (NITROFURANTOIN MONOHYD MACRO) 1 tab by mouth two times a day x 7 days  #14 x 0   Entered and Authorized by:   Ruthe Mannan MD   Signed by:   Ruthe Mannan MD on 06/18/2009   Method used:   [facility] to        [facility]  [address] (retail)       [address]       [place], [place]  [address]       Ph: [phone] or [phone]       Fax: [phone]   RxID:   [id number]   Current Allergies (reviewed today): ! SULFA ! * ANTIINFLAMATORY ! ZOCOR ! MEVACOR ! NORVASC ! ZOLOFT ! * LATEX  Laboratory Results   Urine Tests   Date/Time Reported: June 18, 2009 10:36 AM  Routine Urinalysis   Color: yellow Appearance: Clear Glucose: negative   (Normal Range: Negative) Bilirubin: negative   (Normal Range: Negative) Ketone: negative   (Normal Range: Negative) Spec. Gravity: <1.005   (Normal Range: 1.003-1.035) Blood: moderate   (Normal Range: Negative) pH: 7.0   (Normal Range: 5.0-8.0) Protein: negative   (Normal Range: Negative) Urobilinogen: 0.2   (Normal Range: 0-1) Nitrite: negative   (Normal Range: Negative) Leukocyte Esterace: negative   (Normal Range: Negative)

## 2010-06-30 NOTE — Assessment & Plan Note (Signed)
Summary: sinuses/alc   Vital Signs:  Patient profile:   77 year old female Height:      62 inches Weight:      151.13 pounds BMI:     27.74 Temp:     98.4 degrees F oral Pulse rate:   84 / minute Pulse rhythm:   regular BP sitting:   128 / 68  (left arm) Cuff size:   regular  Vitals Entered By: Delilah Shan CMA Duncan Dull) (August 22, 2009 3:20 PM) CC: sinuses   History of Present Illness: 77 yo female here for sinus problems. She at first though it was her allergies but nasal congestion and sinus pressure has been worsening over past 2 weeks. Increased malaise as well, felt she had chills last night. No fevers. No cough, no ear pain, no wheezing or SOB.  Current Medications (verified): 1)  Hydrochlorothiazide 25 Mg Tabs (Hydrochlorothiazide) .... 2 Tabs By Mouth Every Am 2)  Potassium Chloride Cr 10 Meq  Tbcr (Potassium Chloride) .... Take One By Mouth Daily 3)  Centrum Cardio .... Take 1 Tablet By Mouth Once A Day 4)  Calcium + D 1000mg  .... Take 1 Tablet By Mouth Once A Day 5)  Flonase 50 Mcg/act  Susp (Fluticasone Propionate) .Marland Kitchen.. 1 Spray Each Nostril Daily 6)  Astelin 137 Mcg/spray  Soln (Azelastine Hcl) .Marland Kitchen.. 1 Spray Each Nostril Daily 7)  Allergy Shots .... Every Other Week 8)  Fish Oil 1200 Mg Caps (Omega-3 Fatty Acids) .... 2 Caps By Mouth Daily 9)  Aspirin 81 Mg Coated .Marland Kitchen.. 1 By Mouth Once Daily With Food 10)  Fexofenadine Hcl 180 Mg Tabs (Fexofenadine Hcl) .... Take 1 Tablet By Mouth Once A Day As Needed 11)  Paroxetine Hcl 10 Mg Tabs (Paroxetine Hcl) .... One By Mouth Daily 12)  Lidex 0.05 % Crea (Fluocinonide) .... Apply To Area 2-4 Times A Day Until Clears Up 13)  Omeprazole 20 Mg Cpdr (Omeprazole) .... One By Mouth Daily 14)  Azithromycin 250 Mg  Tabs (Azithromycin) .... 2 By  Mouth Today and Then 1 Daily For 4 Days  Allergies: 1)  ! Sulfa 2)  ! * Antiinflamatory 3)  ! Zocor 4)  ! Mevacor 5)  ! Norvasc 6)  ! Zoloft 7)  ! * Latex  Review of Systems      See  HPI General:  Complains of chills; denies fever. CV:  Denies chest pain or discomfort. Resp:  Denies cough, shortness of breath, and sputum productive.  Physical Exam  General:  Well-developed,well-nourished,in no acute distress; alert,appropriate and cooperative throughout examination. VS reviewed- afebrile, normotensive Non toxic. Ears:  R ear normal and L ear normal.   Nose:  nasal dischargemucosal pallor.   Frontal sinuses TTP Mouth:  pharyngeal erythema.   Lungs:  normal respiratory effort, no crackles, and no wheezes.   Heart:  Normal rate and regular rhythm. S1 and S2 normal without gallop, murmur, click, rub or other extra sounds. Extremities:  No clubbing, cyanosis, edema, or deformity noted with normal full range of motion of all joints.   Psych:  normal affect, talkative and pleasant    Impression & Recommendations:  Problem # 1:  ACUTE FRONTAL SINUSITIS (ICD-461.1) Assessment New Given duration and progression of symptoms, will treat with abx. See pt instructions for details. Her updated medication list for this problem includes:    Flonase 50 Mcg/act Susp (Fluticasone propionate) .Marland Kitchen... 1 spray each nostril daily    Astelin 137 Mcg/spray Soln (Azelastine hcl) .Marland KitchenMarland KitchenMarland KitchenMarland Kitchen 1  spray each nostril daily    Azithromycin 250 Mg Tabs (Azithromycin) .Marland Kitchen... 2 by  mouth today and then 1 daily for 4 days  Complete Medication List: 1)  Hydrochlorothiazide 25 Mg Tabs (Hydrochlorothiazide) .... 2 tabs by mouth every am 2)  Potassium Chloride Cr 10 Meq Tbcr (Potassium chloride) .... Take one by mouth daily 3)  Centrum Cardio  .... Take 1 tablet by mouth once a day 4)  Calcium + D 1000mg   .... Take 1 tablet by mouth once a day 5)  Flonase 50 Mcg/act Susp (Fluticasone propionate) .Marland Kitchen.. 1 spray each nostril daily 6)  Astelin 137 Mcg/spray Soln (Azelastine hcl) .Marland Kitchen.. 1 spray each nostril daily 7)  Allergy Shots  .... Every other week 8)  Fish Oil 1200 Mg Caps (Omega-3 fatty acids) .... 2 caps by  mouth daily 9)  Aspirin 81 Mg Coated  .Marland Kitchen.. 1 by mouth once daily with food 10)  Fexofenadine Hcl 180 Mg Tabs (Fexofenadine hcl) .... Take 1 tablet by mouth once a day as needed 11)  Paroxetine Hcl 10 Mg Tabs (Paroxetine hcl) .... One by mouth daily 12)  Lidex 0.05 % Crea (Fluocinonide) .... Apply to area 2-4 times a day until clears up 13)  Omeprazole 20 Mg Cpdr (Omeprazole) .... One by mouth daily 14)  Azithromycin 250 Mg Tabs (Azithromycin) .... 2 by  mouth today and then 1 daily for 4 days  Patient Instructions: 1)  Take Zpack as directed.  Drink lots of fluids.  Treat sympotmatically with Mucinex, nasal saline irrigation, and Tylenol/Ibuprofen.  You can use warm compresses.  . Call if not improving as expected in 5-7 days.  Prescriptions: AZITHROMYCIN 250 MG  TABS (AZITHROMYCIN) 2 by  mouth today and then 1 daily for 4 days  #6 x 0   Entered and Authorized by:   Ruthe Mannan MD   Signed by:   Ruthe Mannan MD on 08/22/2009   Method used:   [facility]        [facility]  [address] [facility]       [address]       [place], [place]  [address]       Ph: [phone] or [phone]       Fax: [phone]   RxID:   [id number]   Current Allergies (reviewed today): ! SULFA ! * ANTIINFLAMATORY ! ZOCOR ! MEVACOR ! NORVASC ! ZOLOFT ! * LATEX

## 2010-06-30 NOTE — Progress Notes (Signed)
Summary: Night sweats  Phone Note Call from Patient Call back at [phone] cell   Caller: Patient Call For: Judith Part MD Summary of Call: Patient says that for the last few weeks she wakes up with her bed wet, head, wet, and her gown wet like she has just gotten out of the shower.  No changes in medications.  Patient doesn't know what this could be coming from.  Please advise. Initial call taken by: Linde Gillis CMA Duncan Dull),  September 24, 2009 9:32 AM  Follow-up for Phone Call        needs follow up appt for first availible for eval and poss labs to try to find the source  Follow-up by: Judith Part MD,  September 24, 2009 1:31 PM  Additional Follow-up for Phone Call Additional follow up Details #1::        Patient notified as instructed by telephone. Pt scheduled appt with Dr Milinda Antis 10/02/09 at 8:45am and pt will come fasting in case needs lab work. If pt has more problem before then she will call back.Lewanda Rife LPN  September 24, 2009 2:42 PM

## 2010-06-30 NOTE — Assessment & Plan Note (Signed)
Summary: flu shot/alc   Nurse Visit   Allergies: 1)  ! Sulfa 2)  ! * Antiinflamatory 3)  ! Zocor 4)  ! Mevacor 5)  ! Norvasc 6)  ! Zoloft 7)  ! * Latex  Immunizations Administered:  Influenza Vaccine # 1:    Vaccine Type: Fluvax MCR    Site: left deltoid    Mfr: [employer]    Dose: 0.5 ml    Route: IM    Given by: Mervin Hack CMA (AAMA)    Exp. Date: 11/28/2010    Lot #: [id number]    VIS given: 12/23/09 version given February 25, 2010.  Flu Vaccine Consent Questions:    Do you have a history of severe allergic reactions to this vaccine? no    Any prior history of allergic reactions to egg and/or gelatin? no    Do you have a sensitivity to the preservative Thimersol? no    Do you have a past history of Guillan-Barre Syndrome? no    Do you currently have an acute febrile illness? no    Have you ever had a severe reaction to latex? no    Vaccine information given and explained to patient? yes    Are you currently pregnant? no  Orders Added: 1)  Influenza Vaccine MCR [00025]

## 2010-06-30 NOTE — Assessment & Plan Note (Signed)
Summary: cough,sore throat, runny nose   Vital Signs:  Patient profile:   77 year old female Height:      62 inches Weight:      147.50 pounds BMI:     27.08 Temp:     98 degrees F oral Pulse rate:   92 / minute Pulse rhythm:   regular BP sitting:   130 / 62  (left arm) Cuff size:   regular  Vitals Entered By: Delilah Shan CMA Duncan Dull) (July 09, 2009 9:12 AM) CC: Cough, ST   History of Present Illness: 77 yo female with 1 week of sinus pressure, nasal congestion, productive cough. Had some wheezing last night. Son and husband had similar symptoms last week. Has had subjective fevers and chills this this weekend. No shortness of breath. No n/v/d, no rashes. Taking Mucinex OTC with some relief of symptoms.  Current Medications (verified): 1)  Hydrochlorothiazide 25 Mg Tabs (Hydrochlorothiazide) .... 2 Tabs By Mouth Every Am 2)  Potassium Chloride Cr 10 Meq  Tbcr (Potassium Chloride) .... Take One By Mouth Daily 3)  Centrum Cardio .... Take 1 Tablet By Mouth Once A Day 4)  Calcium + D 1000mg  .... Take 1 Tablet By Mouth Once A Day 5)  Flonase 50 Mcg/act  Susp (Fluticasone Propionate) .Marland Kitchen.. 1 Spray Each Nostril Daily 6)  Astelin 137 Mcg/spray  Soln (Azelastine Hcl) .Marland Kitchen.. 1 Spray Each Nostril Daily 7)  Allergy Shots .... Every Other Week 8)  Fish Oil 1200 Mg Caps (Omega-3 Fatty Acids) .... 2 Caps By Mouth Daily 9)  Aspirin 81 Mg Coated .Marland Kitchen.. 1 By Mouth Once Daily With Food 10)  Fexofenadine Hcl 180 Mg Tabs (Fexofenadine Hcl) .... Take 1 Tablet By Mouth Once A Day As Needed 11)  Paroxetine Hcl 10 Mg Tabs (Paroxetine Hcl) .... One By Mouth Daily 12)  Lidex 0.05 % Crea (Fluocinonide) .... Apply To Area 2-4 Times A Day Until Clears Up 13)  Azithromycin 250 Mg  Tabs (Azithromycin) .... 2 By  Mouth Today and Then 1 Daily For 4 Days 14)  Omeprazole 20 Mg Cpdr (Omeprazole) .... One By Mouth Daily  Allergies: 1)  ! Sulfa 2)  ! * Antiinflamatory 3)  ! Zocor 4)  ! Mevacor 5)  !  Norvasc 6)  ! Zoloft 7)  ! * Latex  Review of Systems      See HPI General:  Complains of chills and fever. ENT:  Complains of earache, sinus pressure, and sore throat; denies ear discharge and nasal congestion. Resp:  Complains of cough, sputum productive, and wheezing; denies shortness of breath. GI:  Denies diarrhea, nausea, and vomiting.  Physical Exam  General:  Well-developed,well-nourished,in no acute distress; alert,appropriate and cooperative throughout examination. VS reviewed- afebrile, normotensive Non toxic. Nose:  nasal dischargemucosal pallor.   Maxillary sinuses TTP Mouth:  pharyngeal erythema.   Lungs:  normal respiratory effort, no crackles, and no wheezes.   Heart:  Normal rate and regular rhythm. S1 and S2 normal without gallop, murmur, click, rub or other extra sounds. Extremities:  No clubbing, cyanosis, edema, or deformity noted with normal full range of motion of all joints.   Psych:  normal affect, talkative and pleasant    Impression & Recommendations:  Problem # 1:  ACUTE MAXILLARY SINUSITIS (ICD-461.0) Assessment New Given duration of symptoms, will treat for bacterial sinusitis. See pt instructions for details. Her updated medication list for this problem includes:    Flonase 50 Mcg/act Susp (Fluticasone propionate) .Marland Kitchen... 1 spray  each nostril daily    Astelin 137 Mcg/spray Soln (Azelastine hcl) .Marland Kitchen... 1 spray each nostril daily    Azithromycin 250 Mg Tabs (Azithromycin) .Marland Kitchen... 2 by  mouth today and then 1 daily for 4 days  Complete Medication List: 1)  Hydrochlorothiazide 25 Mg Tabs (Hydrochlorothiazide) .... 2 tabs by mouth every am 2)  Potassium Chloride Cr 10 Meq Tbcr (Potassium chloride) .... Take one by mouth daily 3)  Centrum Cardio  .... Take 1 tablet by mouth once a day 4)  Calcium + D 1000mg   .... Take 1 tablet by mouth once a day 5)  Flonase 50 Mcg/act Susp (Fluticasone propionate) .Marland Kitchen.. 1 spray each nostril daily 6)  Astelin 137 Mcg/spray  Soln (Azelastine hcl) .Marland Kitchen.. 1 spray each nostril daily 7)  Allergy Shots  .... Every other week 8)  Fish Oil 1200 Mg Caps (Omega-3 fatty acids) .... 2 caps by mouth daily 9)  Aspirin 81 Mg Coated  .Marland Kitchen.. 1 by mouth once daily with food 10)  Fexofenadine Hcl 180 Mg Tabs (Fexofenadine hcl) .... Take 1 tablet by mouth once a day as needed 11)  Paroxetine Hcl 10 Mg Tabs (Paroxetine hcl) .... One by mouth daily 12)  Lidex 0.05 % Crea (Fluocinonide) .... Apply to area 2-4 times a day until clears up 13)  Azithromycin 250 Mg Tabs (Azithromycin) .... 2 by  mouth today and then 1 daily for 4 days 14)  Omeprazole 20 Mg Cpdr (Omeprazole) .... One by mouth daily  Patient Instructions: 1)  Take antibiotic as directed.  Drink lots of fluids.  Treat sympotmatically with Mucinex, nasal saline irrigation, and Tylenol/Ibuprofen. Also try claritin D or zyrtec D over the counter- two times a day as needed ( have to sign for them at pharmacy). You can use warm compresses.  Cough suppressant at night. Call if not improving as expected in 5-7 days.  Prescriptions: OMEPRAZOLE 20 MG CPDR (OMEPRAZOLE) one by mouth daily  #90 x 3   Entered and Authorized by:   Ruthe Mannan MD   Signed by:   Ruthe Mannan MD on 07/09/2009   Method used:   [facility]  [address] [id number]       [address]       [place], [place]  [address]       Ph: [phone] or [phone]       Fax: [phone]   RxID:   [id number] AZITHROMYCIN 250 MG  TABS (AZITHROMYCIN) 2 by  mouth today and then 1 daily for 4 days  #6 x 0   Entered and Authorized by:   Ruthe Mannan MD   Signed by:   Ruthe Mannan MD on 07/09/2009   Method used:   [facility]        [facility]  [address]retail)       [address]       [place], [place]  [address]       Ph: [phone] or [phone]       Fax: [phone]   RxID:   [id number]   Current Allergies (reviewed today): ! SULFA ! *  ANTIINFLAMATORY ! ZOCOR ! MEVACOR ! NORVASC ! ZOLOFT ! * LATEX

## 2010-06-30 NOTE — Assessment & Plan Note (Signed)
Summary: FOLLOW UP PER DR Jadrian Bulman/RI   Vital Signs:  Patient profile:   77 year old female Height:      62 inches Weight:      149.75 pounds BMI:     27.49 Temp:     98.5 degrees F oral Pulse rate:   76 / minute Pulse rhythm:   regular BP sitting:   140 / 82  (left arm) Cuff size:   regular  Vitals Entered By: Lewanda Rife LPN (Oct 02, 1608 8:54 AM) CC: night sweats   History of Present Illness: here for night sweats  really bad for 3-4 weeks  wakes up every am -- soaked  this in the past -- but nothing like this   no uri or cough or urinary symptoms at all   no exp to TB that she knows of  she does some volunteer work at [employer] -- and never had TB test  no tick bites   some excema -- and using cetaphil with steroid in it -- that is helping from derm   stress level is better  needs refil on her paxil   last virus and sickness was in fall and winter   wt is down today 2 lb bp is up a bit  past - has had trouble with menopause     Allergies: 1)  ! Sulfa 2)  ! * Antiinflamatory 3)  ! Zocor 4)  ! Mevacor 5)  ! Norvasc 6)  ! Zoloft 7)  ! * Latex  Past History:  Past Medical History: Last updated: 10/08/2008 Hyperlipidemia (05/31/1992) Hypertension (05/31/2000) Osteopenia (11/29/1999) Osteoarthritis pedal edema (worsened by norvasc) chronic foot pain, mortons neuroma  plantar fasciitis   podiatry - [facility]  ortho- Dr Cleophas Dunker  Past Surgical History: Last updated: 09/27/2007 HYST., PARTIAL (ENDOMETRIOSIS) 1970 FOOT PROBLEMS (NEUROMAS) COLONOSCOPY NEG. 11/99, ?1980 CYSTOCELE NEG. SX 1.01 ANTERIOR REPAIR W/ SURG PROC. RSO, 1.31.01 HEMORRHOIDS DEXA OSTEOPENIA, MIXED 1.06 fx in L fibula-? 2003 CT- MASS RIGHT ADNEXA, [place] by gyn-observation  Family History: Last updated: 07/02/2008 brother DM times 2 3 sisters with DM brother - died of DM 1 son - anxiety - mental health problems   mother OP, DM, pacemaker father copd  mother  -HTN aunt high chol  lots of copd in family  Social History: Last updated: 01/22/2008 note, allergic to Td married non smoker does some volunteer visits to [employer]   Risk Factors: Smoking Status: never (09/09/2006)  Review of Systems General:  Complains of fatigue; denies chills, fever, and malaise. Eyes:  Denies blurring and eye pain. CV:  Denies chest pain or discomfort, lightheadness, and palpitations. Resp:  Denies cough and wheezing. GI:  Denies abdominal pain, change in bowel habits, indigestion, and nausea. GU:  Denies abnormal vaginal bleeding, discharge, dysuria, hematuria, and urinary frequency. MS:  Complains of stiffness; denies joint redness and joint swelling. Derm:  Denies itching, lesion(s), poor wound healing, and rash. Neuro:  Denies numbness, tingling, and weakness. Psych:  Denies anxiety and depression. Endo:  Complains of heat intolerance; denies cold intolerance, excessive thirst, excessive urination, polyuria, and weight change. Heme:  Denies abnormal bruising, bleeding, enlarge lymph nodes, fevers, pallor, and skin discoloration.  Physical Exam  General:  Well-developed,well-nourished,in no acute distress; alert,appropriate and cooperative throughout examination Head:  normocephalic, atraumatic, and no abnormalities observed.  no sinus tenderness Eyes:  vision grossly intact, pupils equal, pupils round, pupils reactive to light, and no injection.   Ears:  R  ear normal and L ear normal.   Nose:  no nasal discharge.   Mouth:  pharyngeal erythema.   Neck:  supple with full rom and no masses or thyromegally, no JVD or carotid bruit  Chest Wall:  No deformities, masses, or tenderness noted. Lungs:  Normal respiratory effort, chest expands symmetrically. Lungs are clear to auscultation, no crackles or wheezes. Heart:  Normal rate and regular rhythm. S1 and S2 normal without gallop, murmur, click, rub or other extra sounds. Abdomen:  Bowel sounds  positive,abdomen soft and non-tender without masses, organomegaly or hernias noted. no renal bruits Msk:  no acute joint changes  Pulses:  R and L carotid,radial,femoral,dorsalis pedis and posterior tibial pulses are full and equal bilaterally Extremities:  No clubbing, cyanosis, edema, or deformity noted with normal full range of motion of all joints.   Neurologic:  sensation intact to light touch, gait normal, and DTRs symmetrical and normal.   Skin:  mild eczema on lower legs no new rash or insect bites seen  Cervical Nodes:  No lymphadenopathy noted Axillary Nodes:  No palpable lymphadenopathy Inguinal Nodes:  No significant adenopathy Psych:  normal affect, talkative and pleasant    Impression & Recommendations:  Problem # 1:  NIGHT SWEATS (ICD-780.8) Assessment New new and possibly menopausal but need to rule out other poss etiol like infx labs and ua today if all nl will do PPD  if all nl -- disc hormonal options  Orders: Venipuncture (40102) TLB-BMP (Basic Metabolic Panel-BMET) (80048-METABOL) TLB-CBC Platelet - w/Differential (85025-CBCD) TLB-Hepatic/Liver Function Pnl (80076-HEPATIC) TLB-TSH (Thyroid Stimulating Hormone) (84443-TSH) T-Culture, Urine ([address]36-64403) Specimen Handling (47425) UA Dipstick W/ Micro (manual) (81000)  Complete Medication List: 1)  Hydrochlorothiazide 25 Mg Tabs (Hydrochlorothiazide) .... 2 tabs by mouth every am 2)  Potassium Chloride Cr 10 Meq Tbcr (Potassium chloride) .... Take one by mouth daily 3)  Centrum Cardio  .... Take 1 tablet by mouth once a day 4)  Calcium + D 1000mg   .... Take 1 tablet by mouth once a day 5)  Flonase 50 Mcg/act Susp (Fluticasone propionate) .Marland Kitchen.. 1 spray each nostril daily 6)  Astelin 137 Mcg/spray Soln (Azelastine hcl) .Marland Kitchen.. 1 spray each nostril daily 7)  Allergy Shots  .... Every other week 8)  Fish Oil 1200 Mg Caps (Omega-3 fatty acids) .... 2 caps by mouth daily 9)  Aspirin 81 Mg Coated  .Marland Kitchen.. 1 by mouth once  daily with food 10)  Fexofenadine Hcl 180 Mg Tabs (Fexofenadine hcl) .... Take 1 tablet by mouth once a day as needed 11)  Paroxetine Hcl 10 Mg Tabs (Paroxetine hcl) .... One by mouth daily 12)  Omeprazole 20 Mg Cpdr (Omeprazole) .... One by mouth daily 13)  Cetaphil Cream With Steroids  .... As directed  Other Orders: Prescription Created Electronically [id number])  Patient Instructions: 1)  get a thermometer and start checking your temp  2)  labs today 3)  urinalysis today  4)  follow up with me in about 7-10 days to discuss results 5)  if fever or other symptoms , please let me know  Prescriptions: POTASSIUM CHLORIDE CR 10 MEQ  TBCR (POTASSIUM CHLORIDE) take one by mouth daily  #30 x 11   Entered and Authorized by:   Judith Part MD   Signed by:   Judith Part MD on 10/02/2009   Method used:   [facility]        [facility]  [address] [phone][facility] (retail[facility]       [phone]       [place], [place]  [address]       Ph: [phone] or [phone]       Fax: [phone]   RxID:   [id number] HYDROCHLOROTHIAZIDE 25 MG TABS (HYDROCHLOROTHIAZIDE) 2 tabs by mouth every am  #60 x 11   Entered and Authorized by:   Judith Part MD   Signed by:   Judith Part MD on 10/02/2009   Method used:   [facility]        [facility]  [address] [id number]       [address]       [place], [place]  [address]       Ph: [phone] or [phone]       Fax: [phone]   RxID:   [id number] PAROXETINE HCL 10 MG TABS (PAROXETINE HCL) one by mouth daily  #30 x 11   Entered and Authorized by:   Judith Part MD   Signed by:   Judith Part MD on 10/02/2009   Method used:   [facility]        [facility]  [address] [id number]       [address]       [place], [place]  [address]       Ph: [phone] or [phone]       Fax: [phone]   RxID:   [id number]   Current Allergies (reviewed today): !  SULFA ! * ANTIINFLAMATORY ! ZOCOR ! MEVACOR ! NORVASC ! ZOLOFT ! * LATEX  Laboratory Results   Urine Tests  Date/Time Received: Oct 02, 2009 10:02 AM  Date/Time Reported: Oct 02, 2009 10:02 AM   Routine Urinalysis   Color: yellow Appearance: Hazy Glucose: negative   (Normal Range: Negative) Bilirubin: negative   (Normal Range: Negative) Ketone: negative   (Normal Range: Negative) Spec. Gravity: 1.010   (Normal Range: 1.003-1.035) Blood: small   (Normal Range: Negative) pH: 6.5   (Normal Range: 5.0-8.0) Protein: trace   (Normal Range: Negative) Urobilinogen: 0.2   (Normal Range: 0-1) Nitrite: negative   (Normal Range: Negative) Leukocyte Esterace: negative   (Normal Range: Negative)  Urine Microscopic WBC/HPF: 1 RBC/HPF: 3-4 Bacteria/HPF: few Mucous/HPF: few Epithelial/HPF: 1-2 Crystals/HPF: 0 Casts/LPF: 0 Yeast/HPF: 0 Other: 0

## 2010-06-30 NOTE — Progress Notes (Signed)
Summary: Vivelle  Phone Note Call from Patient Call back at cell:  [phone]   Caller: Patient Call For: Judith Part MD Summary of Call: The patient says she was placed on Vivelle 0.1 and was given samples.  She has used the last one.  Does she need to continue and if so, she needs either more samples or [facility] [address]. Initial call taken by: Delilah Shan CMA Duncan Dull),  October 30, 2009 10:19 AM  Follow-up for Phone Call        if it has significantly helped swelling - we will continue at lowest dose possible for symptoms  if it has not helped -- will stop it let me know please Follow-up by: Judith Part MD,  October 30, 2009 11:36 AM  Additional Follow-up for Phone Call Additional follow up Details #1::        Left message on voicemail  to return call.   Lugene Fuquay CMA Duncan Dull)  October 30, 2009 12:39 PM   The patient says she doesn't know what we're talking about with the swelling.  Did you mean sweating?  If so, she says it is some better but not totally taken care of and she doesn't know if it takes a while to receive maximum benefit or not. Lugene Fuquay CMA Duncan Dull)  October 30, 2009 3:08 PM     Additional Follow-up for Phone Call Additional follow up Details #2::    I did mean sweating -sorry she should have had max benefit after just a week or 2 -- so it sounds like it was not effective enough to be worth it  stay off of it for the rest of the week and let me know how she is next week  I may want to refer her to an endocrinologist for the sweating Follow-up by: Judith Part MD,  October 30, 2009 3:20 PM  Additional Follow-up for Phone Call Additional follow up Details #3:: Details for Additional Follow-up Action Taken: Left message on voicemail  to return call. Delilah Shan [facility])   October 30, 2009 3:25 PM Left message on voicemail  to return call. Lugene Fuquay CMA (AAMA)  October 31, 2009 9:19 AM   Advised pt.            Lowella Petties CMA  October 31, 2009 9:37 AM

## 2010-06-30 NOTE — Assessment & Plan Note (Signed)
Summary: 1 WEEK FOLLOW UP DISCUSS LABS/SYMPTONS/RBH   Vital Signs:  Patient profile:   77 year old female Height:      62 inches Weight:      147 pounds BMI:     26.98 Temp:     98.2 degrees F oral Pulse rate:   68 / minute Pulse rhythm:   regular BP sitting:   126 / 74  (left arm) Cuff size:   regular  Vitals Entered By: Lewanda Rife LPN (March 16, 2010 10:20 AM) CC: one week f/u to discuss labs   History of Present Illness: here for f/u of fatigue and to rev labs   lab panel came out nl incl cbc and thyroid gluc 107- but that was non fasting   is overall feeling better than she was -- does not know why  has really recovered from GI virus  able to eat a little better  stays away from rich and fried foods   does not want further eval or tx of depression good support in her sisters  has a lot of loss and responsibilities at this time   is involved in a church-- support there too   is taking good care of herself her best therapy is getting out as much as possible  visits her aunt in [facility] home and volunteers at [employer]  tries to remain upbeat       Current Medications (verified): 1)  Hydrochlorothiazide 25 Mg Tabs (Hydrochlorothiazide) .... 2 Tabs By Mouth Every Am 2)  Potassium Chloride Cr 10 Meq  Tbcr (Potassium Chloride) .... Take One By Mouth Daily 3)  Centrum Cardio .... Take 1 Tablet By Mouth Once A Day 4)  Calcium + D 1000mg  .... Take 1 Tablet By Mouth Once A Day 5)  Flonase 50 Mcg/act  Susp (Fluticasone Propionate) .Marland Kitchen.. 1 Spray Each Nostril Daily 6)  Astelin 137 Mcg/spray  Soln (Azelastine Hcl) .Marland Kitchen.. 1 Spray Each Nostril Daily 7)  Allergy Shots .... Every Other Week 8)  Fish Oil 1200 Mg Caps (Omega-3 Fatty Acids) .... 2 Caps By Mouth Daily 9)  Aspirin 81 Mg Coated .Marland Kitchen.. 1 By Mouth Once Daily With Food 10)  Fexofenadine Hcl 180 Mg Tabs (Fexofenadine Hcl) .... Take 1 Tablet By Mouth Once A Day As Needed 11)  Paroxetine Hcl 10 Mg Tabs (Paroxetine Hcl) ....  One By Mouth Daily 12)  Omeprazole 20 Mg Cpdr (Omeprazole) .... One By Mouth Daily 13)  Cetaphil Cream With Steroids .... As Directed As Needed  Allergies: 1)  ! Sulfa 2)  ! * Antiinflamatory 3)  ! Zocor 4)  ! Mevacor 5)  ! Norvasc 6)  ! Zoloft 7)  ! * Latex  Past History:  Past Medical History: Last updated: 10/10/2009 Hyperlipidemia (05/31/1992) Hypertension (05/31/2000) Osteopenia (11/29/1999) Osteoarthritis pedal edema (worsened by norvasc) chronic foot pain, mortons neuroma  plantar fasciitis  menopausal vasomotor symptoms - severe   podiatry - [facility]  ortho- Dr Cleophas Dunker  Past Surgical History: Last updated: 09/27/2007 HYST., PARTIAL (ENDOMETRIOSIS) 1970 FOOT PROBLEMS (NEUROMAS) COLONOSCOPY NEG. 11/99, ?1980 CYSTOCELE NEG. SX 1.01 ANTERIOR REPAIR W/ SURG PROC. RSO, 1.31.01 HEMORRHOIDS DEXA OSTEOPENIA, MIXED 1.06 fx in L fibula-? 2003 CT- MASS RIGHT ADNEXA, [place] by gyn-observation  Family History: Last updated: 07/02/2008 brother DM times 2 3 sisters with DM brother - died of DM 1 son - anxiety - mental health problems   mother OP, DM, pacemaker father copd  mother -HTN aunt high chol  lots  of copd in family  Social History: Last updated: 01/22/2008 note, allergic to Td married non smoker does some volunteer visits to n[facility] homes   Risk Factors: Smoking Status: never (09/09/2006)  Review of Systems General:  Denies chills, fatigue, fever, and malaise. Eyes:  Denies blurring and eye pain. CV:  Denies chest pain or discomfort and palpitations. Resp:  Denies cough, shortness of breath, and wheezing. GI:  Complains of gas; denies diarrhea, indigestion, nausea, and vomiting. GU:  Denies dysuria and urinary frequency. MS:  Denies joint redness, joint swelling, muscle aches, and cramps. Derm:  Denies lesion(s), poor wound healing, and rash. Psych:  Denies easily tearful, panic attacks, sense of great danger, and suicidal  thoughts/plans. Endo:  Denies cold intolerance, excessive thirst, excessive urination, and heat intolerance. Heme:  Denies abnormal bruising and bleeding.  Physical Exam  General:  Well-developed,well-nourished,in no acute distress; alert,appropriate and cooperative throughout examination Head:  normocephalic, atraumatic, and no abnormalities observed.   Eyes:  vision grossly intact, pupils equal, pupils round, and pupils reactive to light.  no conjunctival pallor, injection or icterus  Mouth:  pharynx pink and moist.   Neck:  supple with full rom and no masses or thyromegally, no JVD or carotid bruit  Lungs:  Normal respiratory effort, chest expands symmetrically. Lungs are clear to auscultation, no crackles or wheezes. Heart:  Normal rate and regular rhythm. S1 and S2 normal without gallop, murmur, click, rub or other extra sounds. Abdomen:  Bowel sounds positive,abdomen soft and non-tender without masses, organomegaly or hernias noted. Extremities:  No clubbing, cyanosis, edema, or deformity noted with normal full range of motion of all joints.   Neurologic:  sensation intact to light touch, gait normal, and DTRs symmetrical and normal.   Skin:  Intact without suspicious lesions or rashes Cervical Nodes:  No lymphadenopathy noted Psych:  normal affect, talkative and pleasant  much brighter affect today   Impression & Recommendations:  Problem # 1:  FATIGUE (ICD-780.79) Assessment Improved multifactorial with no organic cause evident (except recent gastroenteritis)overall better  rev her labs in detail  disc depression/loss / situational stress in detail pt declines counseling or inc in med at this time  (no anhedonia or hopelessness) she will keep me updated  has good support  Complete Medication List: 1)  Hydrochlorothiazide 25 Mg Tabs (Hydrochlorothiazide) .... 2 tabs by mouth every am 2)  Potassium Chloride Cr 10 Meq Tbcr (Potassium chloride) .... Take one by mouth daily 3)   Centrum Cardio  .... Take 1 tablet by mouth once a day 4)  Calcium + D 1000mg   .... Take 1 tablet by mouth once a day 5)  Flonase 50 Mcg/act Susp (Fluticasone propionate) .Marland Kitchen.. 1 spray each nostril daily 6)  Astelin 137 Mcg/spray Soln (Azelastine hcl) .Marland Kitchen.. 1 spray each nostril daily 7)  Allergy Shots  .... Every other week 8)  Fish Oil 1200 Mg Caps (Omega-3 fatty acids) .... 2 caps by mouth daily 9)  Aspirin 81 Mg Coated  .Marland Kitchen.. 1 by mouth once daily with food 10)  Fexofenadine Hcl 180 Mg Tabs (Fexofenadine hcl) .... Take 1 tablet by mouth once a day as needed 11)  Paroxetine Hcl 10 Mg Tabs (Paroxetine hcl) .... One by mouth daily 12)  Omeprazole 20 Mg Cpdr (Omeprazole) .... One by mouth daily 13)  Cetaphil Cream With Steroids  .... As directed as needed  Patient Instructions: 1)  let me know if you feel depressed or more tired or if you want to talk  to a counselor 2)  talk to your pastor if needed  3)  labs look ok  4)  no change in medicine    Orders Added: 1)  Est. Patient Level III [[address]]    Current Allergies (reviewed today): ! SULFA ! * ANTIINFLAMATORY ! ZOCOR ! MEVACOR ! NORVASC ! ZOLOFT ! * LATEX

## 2010-06-30 NOTE — Letter (Signed)
Summary: [facility]  [facility]   Imported By: Lanelle Bal 11/03/2009 08:19:39  _____________________________________________________________________  External Attachment:    Type:   Image     Comment:   External Document

## 2010-06-30 NOTE — Consult Note (Signed)
Summary: [facility]  [facility]   Imported By: Maryln Gottron 09/08/2009 13:04:01  _____________________________________________________________________  External Attachment:    Type:   Image     Comment:   External Document

## 2010-06-30 NOTE — Progress Notes (Signed)
Summary: rash is no better  Phone Note Call from Patient Call back at Home Phone [phone]   Caller: Patient Call For: Judith Part MD Summary of Call: Patient says that the lidex cream is not helping the rash. She says that the rash is not better, it is very itchy and wants to know if she can have something else called in to [facility] [address]  Initial call taken by: Melody Comas,  August 25, 2009 11:47 AM  Follow-up for Phone Call        I want to go ahead and ref her to derm  will do ref and route to [facility]  Follow-up by: Judith Part MD,  August 25, 2009 12:32 PM  Additional Follow-up for Phone Call Additional follow up Details #1::        patient advised and agreeable Additional Follow-up by: Benny Lennert CMA Duncan Dull),  August 25, 2009 3:48 PM

## 2010-06-30 NOTE — Assessment & Plan Note (Signed)
Summary: ROA   Vital Signs:  Patient profile:   77 year old female Height:      62 inches Weight:      151 pounds BMI:     27.72 Temp:     97.9 degrees F oral Pulse rate:   80 / minute Pulse rhythm:   regular BP sitting:   130 / 80  (left arm) Cuff size:   regular  Vitals Entered By: Lewanda Rife LPN (Oct 10, 2009 3:29 PM) CC: ten day followup   History of Present Illness: here for 10 day f/u for night sweats   labs were ok with sugar of 102 ucx was neg   not any better with sweating  has started bruising easily -- from her aspirin 81-- ? does she really need to take  eyes were itchy on wed - scratched eye and arm--   had hyst in past   her symptoms are so bad she cannot function  she would like to try hrt again- despite its risks   R foot really hurts - noticed since monday  no trauma  no new walking or exercise or new shoes  is swollen and a little red ? what she did to it no new trauma      Allergies: 1)  ! Sulfa 2)  ! * Antiinflamatory 3)  ! Zocor 4)  ! Mevacor 5)  ! Norvasc 6)  ! Zoloft 7)  ! * Latex  Past History:  Past Surgical History: Last updated: 09/27/2007 HYST., PARTIAL (ENDOMETRIOSIS) 1970 FOOT PROBLEMS (NEUROMAS) COLONOSCOPY NEG. 11/99, ?1980 CYSTOCELE NEG. SX 1.01 ANTERIOR REPAIR W/ SURG PROC. RSO, 1.31.01 HEMORRHOIDS DEXA OSTEOPENIA, MIXED 1.06 fx in L fibula-? 2003 CT- MASS RIGHT ADNEXA, [place] by gyn-observation  Family History: Last updated: 07/02/2008 brother DM times 2 3 sisters with DM brother - died of DM 1 son - anxiety - mental health problems   mother OP, DM, pacemaker father copd  mother -HTN aunt high chol  lots of copd in family  Social History: Last updated: 01/22/2008 note, allergic to Td married non smoker does some volunteer visits to nsg homes   Risk Factors: Smoking Status: never (09/09/2006)  Past Medical History: Hyperlipidemia (05/31/1992) Hypertension (05/31/2000) Osteopenia  (11/29/1999) Osteoarthritis pedal edema (worsened by norvasc) chronic foot pain, mortons neuroma  plantar fasciitis  menopausal vasomotor symptoms - severe   podiatry - [facility]  ortho- Dr Cleophas Dunker  Review of Systems General:  Complains of fatigue; denies chills, fever, loss of appetite, and malaise. Eyes:  Denies blurring and eye irritation. CV:  Denies chest pain or discomfort, lightheadness, palpitations, and shortness of breath with exertion. Resp:  Denies cough and shortness of breath. GI:  Denies abdominal pain and change in bowel habits. GU:  Denies discharge, dysuria, and hematuria. MS:  Complains of joint pain, joint redness, joint swelling, and stiffness; denies cramps and muscle weakness. Derm:  Denies lesion(s), poor wound healing, and rash. Neuro:  Denies numbness and tingling. Psych:  Denies anxiety and depression. Endo:  Complains of heat intolerance; denies cold intolerance, excessive thirst, and excessive urination. Heme:  Denies abnormal bruising and bleeding.  Physical Exam  General:  Well-developed,well-nourished,in no acute distress; alert,appropriate and cooperative throughout examination Head:  normocephalic, atraumatic, and no abnormalities observed.  Eyes:  vision grossly intact, pupils equal, pupils round, pupils reactive to light, and no injection.   Mouth:  pharynx pink and moist.   Neck:  supple with full rom and no masses or  thyromegally, no JVD or carotid bruit  Lungs:  Normal respiratory effort, chest expands symmetrically. Lungs are clear to auscultation, no crackles or wheezes. Heart:  Normal rate and regular rhythm. S1 and S2 normal without gallop, murmur, click, rub or other extra sounds. Abdomen:  Bowel sounds positive,abdomen soft and non-tender without masses, organomegaly or hernias noted. Msk:  R foot- mild erythema and warmth / swelling and pt tenderness over 4th and 5th metatarsals nl rom - pain on dorsiflex nl perf and sens   no crepitice  Pulses:  R and L carotid,radial,femoral,dorsalis pedis and posterior tibial pulses are full and equal bilaterally Extremities:  No clubbing, cyanosis, edema, or deformity noted with normal full range of motion of all joints.   Neurologic:  sensation intact to light touch, gait normal, and DTRs symmetrical and normal.   Skin:  ecchymosis under R eye and on R hand  Cervical Nodes:  No lymphadenopathy noted Psych:  normal affect, talkative and pleasant    Impression & Recommendations:  Problem # 1:  FOOT PAIN, RIGHT (ICD-729.5) Assessment New new lateral R foot pain with some focal erythema and swelling worrisome for stress fx  sent for x ray-and will adv adv ice/ elevation and tylenol prn Orders: Radiology Referral (Radiology)  Problem # 2:  ECCHYMOSES, SPONTANEOUS (ICD-782.7) Assessment: Deteriorated easy bruising is likely due to asa  nl platelets on recent labs rev her risk factors and decided to stop it for now- will monitor   Problem # 3:  NIGHT SWEATS (ICD-780.8) Assessment: Deteriorated nl labs check PPD today disc poss of hormonal -- and disc risks of HRT in detail incl breast ca and cardiovasc she wishes to try low dose patch given samples of vivelle dot .1 -- to try -- and update after the weekend -- can dec dose as tol  inst given  if not imp consider endocrine w/u  Complete Medication List: 1)  Hydrochlorothiazide 25 Mg Tabs (Hydrochlorothiazide) .... 2 tabs by mouth every am 2)  Potassium Chloride Cr 10 Meq Tbcr (Potassium chloride) .... Take one by mouth daily 3)  Centrum Cardio  .... Take 1 tablet by mouth once a day 4)  Calcium + D 1000mg   .... Take 1 tablet by mouth once a day 5)  Flonase 50 Mcg/act Susp (Fluticasone propionate) .Marland Kitchen.. 1 spray each nostril daily 6)  Astelin 137 Mcg/spray Soln (Azelastine hcl) .Marland Kitchen.. 1 spray each nostril daily 7)  Allergy Shots  .... Every other week 8)  Fish Oil 1200 Mg Caps (Omega-3 fatty acids) .... 2 caps by  mouth daily 9)  Aspirin 81 Mg Coated  .Marland Kitchen.. 1 by mouth once daily with food 10)  Fexofenadine Hcl 180 Mg Tabs (Fexofenadine hcl) .... Take 1 tablet by mouth once a day as needed 11)  Paroxetine Hcl 10 Mg Tabs (Paroxetine hcl) .... One by mouth daily 12)  Omeprazole 20 Mg Cpdr (Omeprazole) .... One by mouth daily 13)  Cetaphil Cream With Steroids  .... As directed  Other Orders: TB Skin Test [id number]) Admin 1st Vaccine (10932)  Patient Instructions: 1)  go ahead and stop aspirin for bruising  2)  PPD today ( TB skin test)  3)  schedule follow up nurse visit monday to read the PPD  4)  try the patches - one patch twice weekly  5)  I will check in with you when PPD is read to see if it is starting to help  6)  we will schedule x ray of R foot  at check out  7)  elevate foot when you can  8)  use ice when you can for 10 minute intervals  9)  ES tylenol ok 2 up to every 4 hours for pain   Current Allergies (reviewed today): ! SULFA ! * ANTIINFLAMATORY ! ZOCOR ! MEVACOR ! NORVASC ! ZOLOFT ! * LATEX  Immunizations Administered:  PPD Skin Test:    Vaccine Type: PPD    Site: left forearm    Mfr: Sanofi Pasteur    Dose: 0.1 ml    Route: ID    Given by: Lewanda Rife LPN    Exp. Date: 03/13/2011    Lot #: [id number]

## 2010-07-02 NOTE — Assessment & Plan Note (Signed)
Summary: CONGESTION/JRR   Vital Signs:  Patient profile:   77 year old female Height:      62 inches Weight:      148.50 pounds BMI:     27.26 Temp:     97.6 degrees F oral Pulse rate:   68 / minute Pulse rhythm:   regular BP sitting:   140 / 80  (right arm) Cuff size:   regular  Vitals Entered By: Linde Gillis CMA Duncan Dull) (May 13, 2010 9:50 AM) CC: congestion   History of Present Illness: 77 yo female here for sinus problems and congestion. Started with runny nose and dry cough over 1 week ago. Now has sinus pressure. Increased malaise as well, felt she had chills last night. No fevers. No cough, no ear pain, no wheezing or SOB.  Taking Mucinex OTC but cannot seem to "break it up."  Current Medications (verified): 1)  Hydrochlorothiazide 25 Mg Tabs (Hydrochlorothiazide) .... 2 Tabs By Mouth Every Am 2)  Potassium Chloride Cr 10 Meq  Tbcr (Potassium Chloride) .... Take One By Mouth Daily 3)  Centrum Cardio .... Take 1 Tablet By Mouth Once A Day 4)  Calcium + D 1000mg  .... Take 1 Tablet By Mouth Once A Day 5)  Flonase 50 Mcg/act  Susp (Fluticasone Propionate) .Marland Kitchen.. 1 Spray Each Nostril Daily 6)  Astelin 137 Mcg/spray  Soln (Azelastine Hcl) .Marland Kitchen.. 1 Spray Each Nostril Daily 7)  Allergy Shots .... Every Other Week 8)  Fish Oil 1200 Mg Caps (Omega-3 Fatty Acids) .... 2 Caps By Mouth Daily 9)  Fexofenadine Hcl 180 Mg Tabs (Fexofenadine Hcl) .... Take 1 Tablet By Mouth Once A Day As Needed 10)  Paroxetine Hcl 10 Mg Tabs (Paroxetine Hcl) .... One By Mouth Daily 11)  Omeprazole 20 Mg Cpdr (Omeprazole) .... One By Mouth Daily 12)  Azithromycin 250 Mg  Tabs (Azithromycin) .... 2 By  Mouth Today and Then 1 Daily For 4 Days  Allergies: 1)  ! Sulfa 2)  ! * Antiinflamatory 3)  ! Zocor 4)  ! Mevacor 5)  ! Norvasc 6)  ! Zoloft 7)  ! * Latex  Past History:  Past Medical History: Last updated: 10/10/2009 Hyperlipidemia (05/31/1992) Hypertension (05/31/2000) Osteopenia  (11/29/1999) Osteoarthritis pedal edema (worsened by norvasc) chronic foot pain, mortons neuroma  plantar fasciitis  menopausal vasomotor symptoms - severe   podiatry - [facility]  ortho- Dr Cleophas Dunker  Past Surgical History: Last updated: 09/27/2007 HYST., PARTIAL (ENDOMETRIOSIS) 1970 FOOT PROBLEMS (NEUROMAS) COLONOSCOPY NEG. 11/99, ?1980 CYSTOCELE NEG. SX 1.01 ANTERIOR REPAIR W/ SURG PROC. RSO, 1.31.01 HEMORRHOIDS DEXA OSTEOPENIA, MIXED 1.06 fx in L fibula-? 2003 CT- MASS RIGHT ADNEXA, [place] by gyn-observation  Family History: Last updated: 07/02/2008 brother DM times 2 3 sisters with DM brother - died of DM 1 son - anxiety - mental health problems   mother OP, DM, pacemaker father copd  mother -HTN aunt high chol  lots of copd in family  Social History: Last updated: 01/22/2008 note, allergic to Td married non smoker does some volunteer visits to nsg homes   Risk Factors: Smoking Status: never (09/09/2006)  Review of Systems      See HPI General:  Complains of chills and malaise. ENT:  Complains of nasal congestion, sinus pressure, and sore throat. Resp:  Complains of cough and sputum productive; denies shortness of breath and wheezing.  Physical Exam  General:  Well-developed,well-nourished,in no acute distress; alert,appropriate and cooperative throughout examination VSS, non toxic appearing. Ears:  R  ear normal and L ear normal.   Nose:  Boggy turbinates, sinuses +/- TTP Mouth:  pharyngeal erythema.   Lungs:  Normal respiratory effort, chest expands symmetrically. Lungs are clear to auscultation, no crackles or wheezes. Heart:  Normal rate and regular rhythm. S1 and S2 normal without gallop, murmur, click, rub or other extra sounds. Psych:  normal affect, talkative and pleasant     Impression & Recommendations:  Problem # 1:  ACUTE SINUSITIS, UNSPECIFIED (ICD-461.9) Assessment New Given duration and progression of symptoms, will treat  for bacterial sinusitis with Zpack (multiple abx allergies). Supportive care as per pt instructions. Her updated medication list for this problem includes:    Flonase 50 Mcg/act Susp (Fluticasone propionate) .Marland Kitchen... 1 spray each nostril daily    Astelin 137 Mcg/spray Soln (Azelastine hcl) .Marland Kitchen... 1 spray each nostril daily    Azithromycin 250 Mg Tabs (Azithromycin) .Marland Kitchen... 2 by  mouth today and then 1 daily for 4 days  Complete Medication List: 1)  Hydrochlorothiazide 25 Mg Tabs (Hydrochlorothiazide) .... 2 tabs by mouth every am 2)  Potassium Chloride Cr 10 Meq Tbcr (Potassium chloride) .... Take one by mouth daily 3)  Centrum Cardio  .... Take 1 tablet by mouth once a day 4)  Calcium + D 1000mg   .... Take 1 tablet by mouth once a day 5)  Flonase 50 Mcg/act Susp (Fluticasone propionate) .Marland Kitchen.. 1 spray each nostril daily 6)  Astelin 137 Mcg/spray Soln (Azelastine hcl) .Marland Kitchen.. 1 spray each nostril daily 7)  Allergy Shots  .... Every other week 8)  Fish Oil 1200 Mg Caps (Omega-3 fatty acids) .... 2 caps by mouth daily 9)  Fexofenadine Hcl 180 Mg Tabs (Fexofenadine hcl) .... Take 1 tablet by mouth once a day as needed 10)  Paroxetine Hcl 10 Mg Tabs (Paroxetine hcl) .... One by mouth daily 11)  Omeprazole 20 Mg Cpdr (Omeprazole) .... One by mouth daily 12)  Azithromycin 250 Mg Tabs (Azithromycin) .... 2 by  mouth today and then 1 daily for 4 days  Patient Instructions: 1)  Take antibiotic as directed.  Drink lots of fluids.  Treat sympotmatically with Mucinex, nasal saline irrigation, and Tylenol/Ibuprofen.You can use warm compresses.  Cough suppressant at night. Call if not improving as expected in 5-7 days.  Prescriptions: AZITHROMYCIN 250 MG  TABS (AZITHROMYCIN) 2 by  mouth today and then 1 daily for 4 days  #6 x 0   Entered and Authorized by:   Ruthe Mannan MD   Signed by:   Ruthe Mannan MD on 05/13/2010   Method used:   [facility]  [address] [id number]       [address]       [place], [place]  [address]       Ph: [phone] or [phone]       Fax: [phone]   RxID:   [id number]    Orders Added: 1)  Est. Patient Level III [42595]    Current Allergies (reviewed today): ! SULFA ! * ANTIINFLAMATORY ! ZOCOR ! MEVACOR ! NORVASC ! ZOLOFT ! * LATEX

## 2010-09-28 ENCOUNTER — Ambulatory Visit (INDEPENDENT_AMBULATORY_CARE_PROVIDER_SITE_OTHER): Payer: Medicare Other | Admitting: Family Medicine

## 2010-09-28 VITALS — BP 120/80 | HR 80 | Temp 98.4°F | Wt 148.8 lb

## 2010-09-28 DIAGNOSIS — K5289 Other specified noninfective gastroenteritis and colitis: Secondary | ICD-10-CM

## 2010-09-28 DIAGNOSIS — K529 Noninfective gastroenteritis and colitis, unspecified: Secondary | ICD-10-CM

## 2010-09-28 MED ORDER — PROMETHAZINE HCL 25 MG PO TABS: 12.5000 mg | ORAL_TABLET | Freq: Four times a day (QID) | ORAL | Status: DC | PRN

## 2010-09-28 MED ORDER — PROMETHAZINE HCL 25 MG PO TABS: 12.5000 mg | ORAL_TABLET | Freq: Four times a day (QID) | ORAL | Status: AC | PRN

## 2010-09-28 NOTE — Patient Instructions (Signed)
Gastroenteritis Gastroenteritis is an illness of the intestines. It is sometimes called "stomach flu". It causes nausea, vomiting, stomach cramps, watery poop (diarrhea) and a slight fever. This illness often clears up in 2-3 days. However, it can be serious when people who lose too much fluid from throwing up (vomiting) and/or diarrhea. When too much fluid is lost and has not been replaced, it is called dehydration.   HOME CARE   Wash your hands a lot.   Rest.   Drink fluids slowly. Drinking too much or too fast can cause you to throw up.   Drink "oral rehydration fluids" (ORS) as directed. They can be bought in a grocery store or pharmacy. Ask your doctor or pharmacist how to take the ORS if you do not understand.   Like Gatorade  Stay away from really hot or cold liquids.   Avoid fruit, milk or other dairy products.   Do not eat too much at once.   Avoid tobacco, alcohol and drugs that upset your stomach.   When diarrhea stops, start eating rice, bananas, apples with no skin and dry toast if it sits well with you.  GET HELP RIGHT AWAY IF:  You become weak, dizzy, or faint.   You cannot keep fluids down.   You have a dry mouth, no tears and pee less. These are signs of dehydration.   Belly (abdominal) pain starts, feels worse, or stays in one place.   You or your child has a fever above 100.4 for more than 1 day.   Diarrhea has blood or mucus in it.   You become confused.   After 2 days, you are still throwing up and/or having diarrhea.  MAKE SURE YOU:   Understand these instructions.   Will watch your condition.   Will get help right away if you are not doing well or get worse.  Document Released: 11/03/2007 Document Re-Released: 03/14/2009 [facility] Patient Information 2011 [place], [place].

## 2010-09-28 NOTE — Progress Notes (Signed)
  Subjective:     Amber Rollins is a 77 y.o. female who presents for evaluation of nausea and vomiting. Onset of symptoms was yesterday. Patient describes nausea as moderate. Vomiting has occurred 3 times over the past 1 day. Vomitus is described as normal gastric contents. Symptoms have been associated with body aches. Patient denies fever and hematemesis. Symptoms have stabilized. Evaluation to date has been none. Treatment to date has been none.   Has not eaten anything unusual in the last two days. Currently no diarrhea but her stomach still feels "sour." Has not vomited yet today. Only drinking liquids, has not yet tried solids.  The PMH, PSH, Social History, Family History, Medications, and allergies have been reviewed in [facility], and have been updated if relevant.   Review of Systems Pertinent items are noted in HPI.   Objective:    BP 120/80  Pulse 80  Temp 98.4 F (36.9 C)  Wt 148 lb 12.8 oz (67.495 kg)  General Appearance:    Alert, cooperative, no distress, appears stated age  Head:    Normocephalic, without obvious abnormality, atraumatic  Eyes:    PERRL, conjunctiva/corneas clear, EOM's intact, fundi    benign, both eyes  Ears:    Normal TM's and external ear canals, both ears  Nose:   Nares normal, septum midline, mucosa normal, no drainage    or sinus tenderness  Throat:   Lips, mucosa, and tongue normal; teeth and gums normal  Neck:   Supple, symmetrical, trachea midline, no adenopathy;    thyroid:  no enlargement/tenderness/nodules; no carotid   bruit or JVD  Back:     Symmetric, no curvature, ROM normal, no CVA tenderness  Lungs:     Clear to auscultation bilaterally, respirations unlabored  Chest Wall:    No tenderness or deformity   Heart:    Regular rate and rhythm, S1 and S2 normal, no murmur, rub   or gallop  Abdomen:     Soft, non-tender, bowel sounds active all four quadrants,    no masses, no organomegaly  Extremities:   Extremities normal,  atraumatic, no cyanosis or edema  Pulses:   2+ and symmetric all extremities  Skin:   Skin color, texture, turgor normal, no rashes or lesions     Assessment:    Gastroenteritis   Plan:    Antiemetics per medication orders. Agricultural engineer distributed.

## 2010-09-30 ENCOUNTER — Telehealth: Payer: Self-pay | Admitting: *Deleted

## 2010-09-30 NOTE — Telephone Encounter (Signed)
Pt was seen yesterday for nausea and vomiting- by Dr. Dayton Martes.  Her temp today is 101 and she feels terrible, weak.  Taking 2 tylenol every 4 to 6 hrs.  No diarrhea or severe abd pain.  Advised her to continue taking frequent small sips of fluids, avoid solid foods.  Please advise on what else she can do.

## 2010-09-30 NOTE — Telephone Encounter (Signed)
Patient notified as instructed by telephone. Pt said mouth is dry but she does not feel like going to ER unless she has other symptoms and then she will go. Pt will call with update in AM.

## 2010-09-30 NOTE — Telephone Encounter (Signed)
If dizzy/ dry mouth/ rapid heartbeat- go to ER  , may need fluids Continue tylenol- and if fever increases or abd pain also go to Er Otherwise call in am to update [place] with how she is feeling

## 2010-10-01 ENCOUNTER — Emergency Department (HOSPITAL_COMMUNITY)
Admission: EM | Admit: 2010-10-01 | Discharge: 2010-10-01 | Disposition: A | Discharge status: Home / Self Care | Payer: Medicare Other | Attending: Emergency Medicine | Admitting: Emergency Medicine

## 2010-10-01 ENCOUNTER — Emergency Department (HOSPITAL_COMMUNITY): Payer: Medicare Other

## 2010-10-01 ENCOUNTER — Telehealth: Payer: Self-pay | Admitting: *Deleted

## 2010-10-01 DIAGNOSIS — R112 Nausea with vomiting, unspecified: Secondary | ICD-10-CM | POA: Insufficient documentation

## 2010-10-01 DIAGNOSIS — E876 Hypokalemia: Secondary | ICD-10-CM | POA: Insufficient documentation

## 2010-10-01 DIAGNOSIS — I10 Essential (primary) hypertension: Secondary | ICD-10-CM | POA: Insufficient documentation

## 2010-10-01 DIAGNOSIS — R197 Diarrhea, unspecified: Secondary | ICD-10-CM | POA: Insufficient documentation

## 2010-10-01 DIAGNOSIS — E860 Dehydration: Secondary | ICD-10-CM | POA: Insufficient documentation

## 2010-10-01 LAB — URINALYSIS, ROUTINE W REFLEX MICROSCOPIC
Bilirubin Urine: NEGATIVE
Leukocytes, UA: NEGATIVE
Nitrite: NEGATIVE
Protein, ur: NEGATIVE mg/dL

## 2010-10-01 LAB — POCT CARDIAC MARKERS
CKMB, poc: 2.8 ng/mL (ref 1.0–8.0)
Troponin i, poc: <0.05 ng/mL (ref 0.00–0.09)

## 2010-10-01 LAB — COMPREHENSIVE METABOLIC PANEL
ALT: 26 U/L (ref 0–35)
Alkaline Phosphatase: 82 U/L (ref 39–117)
CO2: 31 mEq/L (ref 19–32)
Chloride: 95 mEq/L — ABNORMAL LOW (ref 96–112)
GFR calc non Af Amer: 57 mL/min — ABNORMAL LOW (ref >60)
Glucose, Bld: 104 mg/dL — ABNORMAL HIGH (ref 70–99)
Potassium: 2.8 mEq/L — ABNORMAL LOW (ref 3.5–5.1)
Sodium: 135 mEq/L (ref 135–145)

## 2010-10-01 LAB — DIFFERENTIAL
Basophils Absolute: 0 10*3/uL (ref 0.0–0.1)
Lymphocytes Relative: 8 % — ABNORMAL LOW (ref 12–46)
Neutro Abs: 5.2 10*3/uL (ref 1.7–7.7)

## 2010-10-01 LAB — TROPONIN I: Troponin I: <0.3 ng/mL (ref <0.30)

## 2010-10-01 LAB — GLUCOSE, CAPILLARY: Glucose-Capillary: 97 mg/dL (ref 70–99)

## 2010-10-01 LAB — LIPASE, BLOOD: Lipase: 22 U/L (ref 11–59)

## 2010-10-01 LAB — CBC
HCT: 39.2 % (ref 36.0–46.0)
Hemoglobin: 13.3 g/dL (ref 12.0–15.0)
WBC: 6.4 10*3/uL (ref 4.0–10.5)

## 2010-10-01 LAB — URINE MICROSCOPIC-ADD ON

## 2010-10-01 MED ORDER — ONDANSETRON 4 MG PO TBDP: 4.0000 mg | ORAL_TABLET | Freq: Three times a day (TID) | ORAL | Status: AC | PRN

## 2010-10-01 NOTE — Telephone Encounter (Signed)
Patient was seen on Monday by Dr. Dayton Martes for nausea and vomiting, she then called back yesterday and was told to go the the ER for fluid because she could possibly be dehydrated. She went to [facility] and was diagnosed with dehydration, hypokalemia. She is still having some nausea. She says that the Hospital recommended dissolvable zofran so that she wouldn't throw it up, but they didn't give her rx for it. She is asking if she can get some called [facility].

## 2010-10-01 NOTE — Telephone Encounter (Signed)
rx sent. Patient aware. 

## 2010-10-05 ENCOUNTER — Telehealth: Payer: Self-pay | Admitting: *Deleted

## 2010-10-05 NOTE — Telephone Encounter (Signed)
Patient went to ER last week, her potssium was low and is to have a follow up to check her potassium with her PCP. She is asking if she can be added on for tomorrow. Please advise.

## 2010-10-05 NOTE — Telephone Encounter (Signed)
Please add on tomorrow afternoon after my schedule is opened up -thanks

## 2010-10-05 NOTE — Telephone Encounter (Signed)
Patient notified as instructed by telephone. Appt scheduled to see Dr Milinda Antis tomorrow at 3:15pm/

## 2010-10-06 ENCOUNTER — Ambulatory Visit (INDEPENDENT_AMBULATORY_CARE_PROVIDER_SITE_OTHER): Payer: Medicare Other | Admitting: Family Medicine

## 2010-10-06 ENCOUNTER — Encounter: Payer: Self-pay | Admitting: Family Medicine

## 2010-10-06 DIAGNOSIS — E876 Hypokalemia: Secondary | ICD-10-CM

## 2010-10-06 DIAGNOSIS — K529 Noninfective gastroenteritis and colitis, unspecified: Secondary | ICD-10-CM | POA: Insufficient documentation

## 2010-10-06 DIAGNOSIS — K5289 Other specified noninfective gastroenteritis and colitis: Secondary | ICD-10-CM

## 2010-10-06 MED ORDER — OMEPRAZOLE 20 MG PO CPDR: 20.0000 mg | DELAYED_RELEASE_CAPSULE | Freq: Every day | ORAL | Status: DC

## 2010-10-06 NOTE — Patient Instructions (Signed)
BRAT diet -- bananas/ rice/ applesauce and toast -- advance diet slowly when your appetite returns  Drink fluids liberally  Update me if symptoms return or do not continue to improve Labs for potassium today

## 2010-10-06 NOTE — Assessment & Plan Note (Signed)
From recent dehydration/ gastroenteritis with n/v/d  r echeck today after fluid rehydration and repl of K in ER

## 2010-10-06 NOTE — Assessment & Plan Note (Signed)
Suspect viral and now much improved with some residual fatigue and weakness Rev hosp records and studies in detail  Disc slow adv of diet and need for rehydration Re check lytes today Update if symptoms return or worsen

## 2010-10-06 NOTE — Progress Notes (Signed)
  Subjective:    Patient ID: Amber Rollins, female    DOB: 04-09-1931, 77 y.o.   MRN: [id number]  HPI Was seen on 5/3 in ER after illness with n/v/d -- and dehydration  Full w/u perf with ua and labs and cxr and cardiac enzymes  K found to be low at 2.8 and pt was hydrated/ k replaced   ? Know where she caught it  No more vomiting  Now and then a little cramp and bms are lighter in color -- firmer but not back to normal  Is drinking lots of fluids - water and gatorade  Not much of an appetite  No abd pain besides the cramping   Given 2 tabs K  In ER and iv K  Needs K checked  Leg cramps are much better    Here for follow up    Review of Systems ROS Review of Systems  Constitutional: Negative for fever, appetite change,  and unexpected weight change.  Eyes: Negative for pain and visual disturbance.  Respiratory: Negative for cough and shortness of breath.   Cardiovascular: Negative.   Gastrointestinal: Negative for n/v , some mild loose stool, neg for abd pain or blood in stool Genitourinary: Negative for urgency and frequency.  Skin: Negative for pallor.  Neurological: Negative for weakness, light-headedness, numbness and headaches.  Hematological: Negative for adenopathy. Does not bruise/bleed easily.  Psychiatric/Behavioral: Negative for dysphoric mood. The patient is not nervous/anxious.           Objective:   Physical Exam  Constitutional: She appears well-developed and well-nourished. No distress.  HENT:  Head: Normocephalic and atraumatic.  Mouth/Throat: Oropharynx is clear and moist.  Eyes: Conjunctivae and EOM are normal. Pupils are equal, round, and reactive to light.  Neck: Normal range of motion. Neck supple. No thyromegaly present.  Cardiovascular: Normal rate, regular rhythm and normal heart sounds.   Pulmonary/Chest: Effort normal and breath sounds normal. No respiratory distress. She has no wheezes.  Abdominal: Soft. Bowel sounds are normal. She  exhibits no distension and no mass. There is no tenderness. There is no rebound and no guarding.  Musculoskeletal: She exhibits no edema.  Lymphadenopathy:    She has no cervical adenopathy.  Neurological: She is alert. She has normal reflexes.  Skin: Skin is warm and dry. No rash noted. She is not diaphoretic. No erythema. No pallor.  Psychiatric: She has a normal mood and affect.          Assessment & Plan:

## 2010-10-07 LAB — BASIC METABOLIC PANEL
BUN: 10 mg/dL (ref 6–23)
GFR: 71.75 mL/min (ref >60.00)
Glucose, Bld: 90 mg/dL (ref 70–99)
Potassium: 3.5 mEq/L (ref 3.5–5.1)

## 2010-10-13 NOTE — Consult Note (Signed)
NAME:  Amber Rollins, Amber Rollins             ACCOUNT NO.:  [id number]   MEDICAL RECORD NO.:  [id number]         PATIENT TYPE:  LOUT   LOCATION:                                FACILITY:  GYN   PHYSICIAN:  De Blanch, M.D.DATE OF BIRTH:  08/27/1930   DATE OF CONSULTATION:  05/19/2007  DATE OF DISCHARGE:                                 CONSULTATION   CHIEF COMPLAINT:  Ovarian cyst.   HISTORY OF PRESENT ILLNESS:  A 77 year old white married female seen in  consultation at the request of Dr. Marice Potter regarding management of an  ovarian cyst and slightly elevated CA-125.   The patient initially had the cyst identified at the time of having a CT  scan of the abdomen and pelvis performed for evaluation of back pain on  July 20, 2006.  Subsequently, she has had ultrasounds in March,  June, and November, showing stability of the cyst.  The cyst is  described as measuring 4.2 x 2.9 x 2.7 in overall dimensions with  several smooth-walled cystic lesions.  No internal nodularity or  abnormal flow is identified.  In correlation with the original CT scan,  there has been no significant change.  The patient has had serial CA-  125s, the first being in March, 2008, which is 43 U/ml.  Subsequently,  the values have measured 35, 38, and now 41 U/ml.   The patient herself is entirely asymptomatic.  She specifically denies  any pelvic pain or any other GI or GU symptoms.  She does not have any  pelvic pressure.   PAST MEDICAL HISTORY/MEDICAL ILLNESSES:  Hypertension.   PAST SURGICAL HISTORY:  1. Vaginal hysterectomy in 1971 for menorrhagia.  2. Tonsils and adenoidectomy.  3. Appendectomy.  4. Resection of Morton's neuroma from her feet x4.  5. Bladder tack.   DRUG ALLERGIES:  SULFA.  PAIN MEDICATIONS cause nausea.  LATEX.   FAMILY HISTORY:  Negative for gynecologic, breast, or colon cancer.   SOCIAL HISTORY:  Patient is married for 55 years.  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  She does not smoke.   OBSTETRICAL HISTORY:  Gravida 2.   CURRENT MEDICATIONS:  Hydrochlorothiazide.  Nasonex.   REVIEW OF SYSTEMS:  A 10-point comprehensive review of systems is  negative, except as noted above.   PHYSICAL EXAMINATION:  Height 5 foot 3.  Weight 150 pounds.  Blood  pressure 158/88, pulse 90, respiratory rate 20.  GENERAL:  Patient is a slender, healthy white female in no acute  distress.  HEENT:  Negative.  NECK:  Supple without thyromegaly.  There is no supraclavicular or  inguinal adenopathy.  ABDOMEN:  Soft and nontender.  No mass, organomegaly, ascites, or  hernias are noted.  PELVIC:  EG/BUS, vagina, bladder, and urethra are normal.  The cervix  and uterus are surgically absent.  In the adnexa, there is an  approximately 3 cm palpable, cystic structure to the left of the vaginal  cuff.  Rectovaginal confirms.  Lower extremities are without edema and varicosities.   IMPRESSION:  Multi-loculated cystic structure in the pelvis,  which may  be an ovarian cyst or hydrosalpinx.  A slightly elevated CA-125.  The  characteristics of this cyst speak to a benign ovarian neoplasm or  hydrosalpinx and the stability over at least nine months is also  reassuring.  The minimally elevated CA-125 certainly can be from benign  conditions.  I think it is unlikely this is a malignancy, given the  relatively stable CA-125s that have been recorded.  Therefore, I would  recommend the continue on followup and would suggest she have repeat  ultrasound and CA-125 in three months.  She understands that we can  never be 100% certain as to her diagnosis but that based on our best  information, this is most likely a benign condition and given that it is  asymptomatic, we will continue to follow her.  She will return to the  care of Dr. Marice Potter for monitoring, but I would be happy to see her again,  should she have any change in her CA-125 or ultrasound characteristics.       De Blanch, M.D.  Electronically Signed     DC/MEDQ  D:  05/19/2007  T:  05/19/2007  Job:  [id number]   cc:   Telford Nab, R.N.  [address]  [place], [place] [address]   Idamae Schuller A. Tower, MD  [address] [place], [place] [address]   Allie Bossier, MD

## 2010-10-13 NOTE — Assessment & Plan Note (Signed)
[facility]                                 ON-CALL NOTE   Amber Rollins, Amber Rollins                      MRN:          [id number]  DATE:06/18/2007                            DOB:          1931-02-04    TIME:  June 18, 2007 at 3:30 in the morning.   PRIMARY CARE PHYSICIAN:  I think she is a patient of Dr. Milinda Antis.   PHONE NUMBER:  [phone]   The patient is 77 years old and has had onset of intractable vomiting  over more than the last 6 hours and she cannot keep anything down  including a pill.  She does not have significant abdominal pain or  diarrhea and has never been this sick.  She has no chest pain or  dizziness.  She wonders what she could do.  I discussed options with her  and it is probably viral, but she is unable to retain fluids.  I  recommended evaluation and treatment in the emergency room.     Neta Mends. Panosh, MD  Electronically Signed    WKP/MedQ  DD: 06/18/2007  DT: 06/18/2007  Job #: [id number]

## 2010-10-16 NOTE — Assessment & Plan Note (Signed)
[facility]                         GASTROENTEROLOGY OFFICE NOTE   Amber, Rollins                 MRN:          [id number]  DATE:07/19/2006                            DOB:          [birth year]    Amber Rollins is a very nice, 77 year old, white female who comes today  with a 2-week history of rectal pain. The pain started rather suddenly  one morning after she woke up. She was not aware of straining, being  constipated or lifting any heavy weights. There was no rectal bleeding.  There was some pain with bowel movements but there has been no change in  her bowel habits. She has 2 soft bowel movements daily without the use  of laxative. Since then her pain has improved to constant pain and it is  worse when she is standing. It was also radiating to her upper legs and  involved entire buttock area. It was worse sitting down. The pain is  about 50% better. We have seen Amber Rollins previously for colonoscopy in  1980's and again in 1999 when she had a mild diverticulosis of the left  colon and first grade hemorrhoids.   MEDICATIONS:  1. HCTZ 25 mg p.o. daily.  2. Premarin 0.625 mg p.o. daily.  3. Centrum.  4. Cartia.  5. Fish oil.  6. Oyster shell.  7. Calcium.  8. Vitamin D.  9. Astelin 1 p.o. daily.  10.Nasonex.  11.Allergy shots.  12.Flexeril 10 mg p.r.n.  13.Hemorrhoidal suppository 25 mg.   PAST MEDICAL HISTORY:  Significant for high blood pressure, high  cholesterol, allergies.   OPERATIONS:  Vaginal hysterectomy, breast surgery and appendectomy.   FAMILY HISTORY:  Negative for colon cancer, diabetes in brother, mother  and sisters. Thyroid problem in sister.   SOCIAL HISTORY:  Married with 2 children. She is a retired Catering manager.  The patient does not smoke or does not drink alcohol.   REVIEW OF SYSTEMS:  Positive for allergies.   PHYSICAL EXAMINATION:  VITAL SIGNS:  Blood pressure 124/72, pulse 60,  weight 153 pounds.  GENERAL:  She was alert and oriented in no distress.  SKIN:  Warm and dry.  NECK:  Supple. Sclera is nonicteric.  LUNGS:  Clear to auscultation.  COR:  Normal S1, normal S2.  ABDOMEN:  Soft, nontender with normal active bowel sounds, no  distention. Liver edge at costal margin. Two well-healed scars in right  lower quadrant and in the midline below the umbilicus.  RECTAL:  Anoscopic exam shows normal perianal area. There is mild  tenderness over the soft tissues around the anal canal. The rectal tone  itself is normal. There was minimal tenderness on digital exam and on  anoscopic exam there is some discomfort circumferentially through the  anal canal and rectal ampulla. Palpation by my finger around the ampulla  including the coccyx show mild tenderness. There was no point tenderness  or any lesion seen through a small amount of Hemoccult negative stool.   IMPRESSION:  Amber Rollins has rectal pain that is suggestive of  coccydynia either due to rectal spasm or to some inflammation either  originating in  the tail bone or in the pelvic tissues. I do not see any  rectal lesion that would be  causing the pain. She has small internal  hemorrhoids but these are not significant and certainly not inflamed.   PLAN:  1. We will proceed with CT scan of the pelvis to rule out space      occupying lesion or some extrinsic pressure on the rectum.  2. Continue Flexeril 10 mg p.o. b.i.d.  3. Motrin 200 mg 2 tablets twice a day for several days.   As long as she continues to improve, I would not anything else. She is  due for colonoscopy in December 2009 but if there is any specific  problems with her bowel habits, rectal bleeding or the rectal pain  becomes worse, I would consider a colonoscopy earlier.     Hedwig Morton. Juanda Chance, MD  Electronically Signed    DMB/MedQ  DD: 07/19/2006  DT: 07/19/2006  Job #: [id number]   cc:   Marne A. Milinda Antis, MD

## 2010-10-19 ENCOUNTER — Other Ambulatory Visit: Payer: Self-pay

## 2010-10-19 MED ORDER — POTASSIUM CHLORIDE ER 10 MEQ PO TBCR: 10.0000 meq | EXTENDED_RELEASE_TABLET | Freq: Every day | ORAL | Status: DC

## 2010-12-01 ENCOUNTER — Other Ambulatory Visit: Payer: Self-pay | Admitting: *Deleted

## 2010-12-01 MED ORDER — HYDROCHLOROTHIAZIDE 25 MG PO TABS: ORAL_TABLET | ORAL | Status: DC

## 2011-02-10 ENCOUNTER — Ambulatory Visit (INDEPENDENT_AMBULATORY_CARE_PROVIDER_SITE_OTHER): Payer: Medicare Other | Admitting: Family Medicine

## 2011-02-10 ENCOUNTER — Encounter: Payer: Self-pay | Admitting: Family Medicine

## 2011-02-10 VITALS — BP 138/72 | HR 68 | Temp 98.2°F | Ht 63.5 in | Wt 141.2 lb

## 2011-02-10 DIAGNOSIS — J069 Acute upper respiratory infection, unspecified: Secondary | ICD-10-CM

## 2011-02-10 NOTE — Progress Notes (Signed)
Subjective:    Patient ID: Amber Rollins, female    DOB: Oct 15, 1930, 77 y.o.   MRN: [id number]  HPI Started getting sick this weekend  Started with nasal congestion  Then a cough- non productive Gets hot a night -- and achey with chills in afternoon -- thinks she may run a fever Tylenol last night and takes an allergy pill   Suspects that it is a bad cold   Has a cough met otc- cannot take codiene   Patient Active Problem List  Diagnoses  . HYPERLIPIDEMIA  . HYPOKALEMIA  . ANXIETY STATE, UNSPECIFIED  . REACTION, ACUTE STRESS W/EMOTIONAL DSTURB  . HYPERTENSION  . ALLERGIC RHINITIS, SEASONAL  . GERD  . IRRITABLE BOWEL SYNDROME  . HEMATURIA UNSPECIFIED  . FIBROCYSTIC BREAST DISEASE  . OVARIAN MASS  . MENOPAUSAL SYNDROME  . OSTEOARTHRITIS  . FOOT PAIN, CHRONIC  . OSTEOPENIA  . FATIGUE  . NIGHT SWEATS  . EDEMA  . INCONTINENCE, URGE  . HEMORRHOIDS, HX OF  . HYSTERECTOMY, HX OF  . Gastroenteritis  . Hypokalemia  . URI, acute   Past Medical History  Diagnosis Date  . Hyperlipemia 05/31/1992  . Hypertension 05/31/2000  . Osteopenia 11/29/1999  . Osteoarthritis   . Pedal edema     worsened by Norvasc  . Chronic foot pain     mortons neuroma  . Plantar fasciitis   . Menopausal symptoms     vasomotor symptoms-severe  . Cystocele 01/01    neg. sx  . Hemorrhoids   . fracture fibula ? 2003    left   Past Surgical History  Procedure Date  . Partial hysterectomy 1970    endometriosis  . Foot problems     neuromas  . Anterior repari w//surg proc 06/30/99    RSO  . History of ct     mass right adnexa, U?S by GYN-observation   History  Substance Use Topics  . Smoking status: Never Smoker   . Smokeless tobacco: Not on file  . Alcohol Use: Not on file   Family History  Problem Relation Age of Onset  . Diabetes Mother   . Heart disease Mother     pacemaker  . Hypertension Mother   . COPD Father   . Diabetes Sister   . Diabetes Brother   . Mental  illness Son     anxiety-mental health problems  . Diabetes Brother   . Diabetes Sister   . Diabetes Sister   . Hypertension Other    Allergies  Allergen Reactions  . Tdap (Adacel) Swelling  . Amlodipine Besylate     REACTION: edema  . Latex   . Lovastatin     REACTION: leg pain  . Sertraline Hcl     REACTION: caused insomnia  . Simvastatin     REACTION: non tolerant- leg pain  . Sulfonamide Derivatives    Current Outpatient Prescriptions on File Prior to Visit  Medication Sig Dispense Refill  . azelastine (ASTELIN) 137 MCG/SPRAY nasal spray 1 spray by Nasal route daily.        . Calcium Carbonate-Vitamin D (CALCIUM-VITAMIN D) 500-200 MG-UNIT per tablet Take 1 tablet by mouth 2 (two) times daily with a meal.        . Cyanocobalamin 1000 MCG CAPS Take 1 capsule by mouth daily.        . fexofenadine (ALLEGRA) 180 MG tablet Take 180 mg by mouth daily as needed.        . fluticasone (FLONASE)  50 MCG/ACT nasal spray 1-2 sprays each nostril daily.      . hydrochlorothiazide 25 MG tablet Take 2 by mouth daily  60 tablet  2  . Multiple Vitamins-Minerals (CENTRUM CARDIO PO) Take by mouth daily.        . NON FORMULARY Allergy shots, every other week       . Omega-3 Fatty Acids (FISH OIL) 1200 MG CAPS Take 2 by mouth daily       . omeprazole (PRILOSEC) 20 MG capsule Take 1 capsule (20 mg total) by mouth daily.  90 capsule  3  . Calcium Carb-Cholecalciferol (CALCIUM 1000 + D PO) Take by mouth daily.        Marland Kitchen PARoxetine (PAXIL) 10 MG tablet Take 10 mg by mouth every morning.             Review of Systems Review of Systems  Constitutional: Negative for fever, appetite change, fatigue and unexpected weight change.  Eyes: Negative for pain and visual disturbance ENT pos for nasal congestion/ ST , neg for facial pain .  Respiratory: Negative for shortness of breath.  pos for cough , no wheeze Cardiovascular: Negative for cp or palpitations    Gastrointestinal: Negative for nausea,  diarrhea and constipation.  Genitourinary: Negative for urgency and frequency.  Skin: Negative for pallor or rash   Neurological: Negative for weakness, light-headedness, numbness and headaches.  Hematological: Negative for adenopathy. Does not bruise/bleed easily.  Psychiatric/Behavioral: Negative for dysphoric mood. The patient is not nervous/anxious.          Objective:   Physical Exam  Constitutional: She appears well-developed and well-nourished. No distress.  HENT:  Head: Normocephalic and atraumatic.  Right Ear: External ear normal.  Left Ear: External ear normal.  Mouth/Throat: Oropharynx is clear and moist.       Nares congested and injected bilaterally No sinus tenderness TMs are dull   Eyes: Conjunctivae and EOM are normal. Pupils are equal, round, and reactive to light. Right eye exhibits no discharge. Left eye exhibits no discharge.  Neck: Normal range of motion. Neck supple. No JVD present. No thyromegaly present.  Cardiovascular: Normal rate, regular rhythm and normal heart sounds.   Pulmonary/Chest: Effort normal and breath sounds normal. No respiratory distress. She has no wheezes. She has no rales. She exhibits no tenderness.  Abdominal: Soft. Bowel sounds are normal.  Lymphadenopathy:    She has no cervical adenopathy.  Skin: Skin is warm and dry. No rash noted. No erythema. No pallor.  Psychiatric: She has a normal mood and affect.          Assessment & Plan:

## 2011-02-10 NOTE — Patient Instructions (Signed)
I think you have a bad cold  Drink lots of water and rest  Continue nasal saline irrigations If you get worse in 10-14 days - especially with sinus pain/ worse cough or fever - please let me know  You may want to try mucinex over the counter to loosen phlegm in head or chest

## 2011-02-10 NOTE — Assessment & Plan Note (Signed)
Viral head and chest "cold" Explained that abx will not help but rev red flags for sinusitis/ pneumonia , etc Disc  sympt care- see inst Update if worse or not imp in 7-10 days

## 2011-02-11 ENCOUNTER — Other Ambulatory Visit: Payer: Self-pay | Admitting: *Deleted

## 2011-02-11 MED ORDER — POTASSIUM CHLORIDE ER 10 MEQ PO TBCR: 10.0000 meq | EXTENDED_RELEASE_TABLET | Freq: Every day | ORAL | Status: DC

## 2011-02-17 ENCOUNTER — Other Ambulatory Visit: Payer: Self-pay | Admitting: *Deleted

## 2011-02-17 NOTE — Telephone Encounter (Signed)
Duplicate refill request.

## 2011-02-19 LAB — DIFFERENTIAL
Basophils Absolute: 0
Basophils Relative: 0
Eosinophils Absolute: 0.1
Monocytes Relative: 2 — ABNORMAL LOW
Neutro Abs: 17.1 — ABNORMAL HIGH

## 2011-02-19 LAB — URINALYSIS, ROUTINE W REFLEX MICROSCOPIC
Bilirubin Urine: NEGATIVE
Glucose, UA: NEGATIVE
Hgb urine dipstick: NEGATIVE
Specific Gravity, Urine: 1.016
Urobilinogen, UA: 0.2
pH: 8

## 2011-02-19 LAB — CBC
HCT: 40.5
Hemoglobin: 14.2
WBC: 18.6 — ABNORMAL HIGH

## 2011-02-19 LAB — BASIC METABOLIC PANEL
GFR calc non Af Amer: >60
Potassium: 3.3 — ABNORMAL LOW
Sodium: 140

## 2011-02-23 ENCOUNTER — Ambulatory Visit (INDEPENDENT_AMBULATORY_CARE_PROVIDER_SITE_OTHER): Payer: Medicare Other

## 2011-02-23 DIAGNOSIS — Z23 Encounter for immunization: Secondary | ICD-10-CM

## 2011-03-03 ENCOUNTER — Other Ambulatory Visit: Payer: Self-pay | Admitting: *Deleted

## 2011-03-03 MED ORDER — HYDROCHLOROTHIAZIDE 25 MG PO TABS: ORAL_TABLET | ORAL | Status: DC

## 2011-04-12 ENCOUNTER — Ambulatory Visit (INDEPENDENT_AMBULATORY_CARE_PROVIDER_SITE_OTHER): Payer: Medicare Other | Admitting: Family Medicine

## 2011-04-12 ENCOUNTER — Encounter: Payer: Self-pay | Admitting: Family Medicine

## 2011-04-12 VITALS — BP 126/82 | HR 80 | Temp 98.2°F | Wt 144.4 lb

## 2011-04-12 DIAGNOSIS — J329 Chronic sinusitis, unspecified: Secondary | ICD-10-CM

## 2011-04-12 MED ORDER — AMOXICILLIN-POT CLAVULANATE 875-125 MG PO TABS: 1.0000 | ORAL_TABLET | Freq: Two times a day (BID) | ORAL | Status: DC

## 2011-04-12 NOTE — Patient Instructions (Signed)
You have a sinus infection. Take medicine as prescribed: augmentin twice daily for 10 days Push fluids and plenty of rest. Nasal saline irrigation or neti pot to help drain sinuses. May use simple mucinex with plenty of fluid to help mobilize mucous. Let us know if fever >101.5, trouble opening/closing mouth, difficulty swallowing, or worsening - you may need to be seen again. 

## 2011-04-12 NOTE — Progress Notes (Signed)
  Subjective:    Patient ID: Amber Rollins, female    DOB: 1930/10/25, 77 y.o.   MRN: [id number]  HPI CC: sinusitis?  1 wk h/o sinus sxs, worse yesterday and today.  Has had sinus congestion, chills.  Teeth hurting some.  This morning feeling nauseated.  Throat feels full.  Yellow mucous out of nose.  Coughing but unable to bring anything up.  Feels from throat up congestion.  Nothing in chest.  Sinus pressure HA in nose and eyes..  Taking ES tylenol, mucinex.  Also on flonase, astelin and saline irrigation bid.  No fevers/chills, abd pain, rashes, myalgia, arthralgia  No sick contacts at home.  No smokers at home.  No h/o asthma/copd.  Review of Systems Per HPI    Objective:   Physical Exam  Nursing note and vitals reviewed. Constitutional: She appears well-developed and well-nourished. No distress.  HENT:  Head: Normocephalic and atraumatic.  Right Ear: Hearing, tympanic membrane, external ear and ear canal normal.  Left Ear: Hearing, tympanic membrane, external ear and ear canal normal.  Nose: No mucosal edema or rhinorrhea. Right sinus exhibits maxillary sinus tenderness. Right sinus exhibits no frontal sinus tenderness. Left sinus exhibits maxillary sinus tenderness. Left sinus exhibits no frontal sinus tenderness.  Mouth/Throat: Uvula is midline, oropharynx is clear and moist and mucous membranes are normal. No oropharyngeal exudate, posterior oropharyngeal edema, posterior oropharyngeal erythema or tonsillar abscesses.  Eyes: Conjunctivae and EOM are normal. Pupils are equal, round, and reactive to light. No scleral icterus.  Neck: Normal range of motion. Neck supple.  Cardiovascular: Normal rate, regular rhythm, normal heart sounds and intact distal pulses.   No murmur heard. Pulmonary/Chest: Effort normal and breath sounds normal. No respiratory distress. She has no wheezes. She has no rales.  Lymphadenopathy:    She has no cervical adenopathy.  Skin: Skin is warm  and dry. No rash noted.  Psychiatric: She has a normal mood and affect.       Assessment & Plan:

## 2011-04-12 NOTE — Assessment & Plan Note (Signed)
Going on 8 days, acutely worse last 2. Anticipate sinusitis.  Will cover with augmentin as has worsened recently. Update if not improving as expected.

## 2011-04-19 ENCOUNTER — Telehealth: Payer: Self-pay | Admitting: *Deleted

## 2011-04-19 MED ORDER — AZITHROMYCIN 250 MG PO TABS: ORAL_TABLET | ORAL | Status: AC

## 2011-04-19 NOTE — Telephone Encounter (Signed)
Message left notifying patient of new Rx.

## 2011-04-19 NOTE — Telephone Encounter (Signed)
Sent in zpack.  plz notify pt.

## 2011-04-19 NOTE — Telephone Encounter (Signed)
Pt was seen last week for a sinus infection and given augmentin.  She stopped taking this on Saturday because it was causing terrible diarrhea, she will not take anymore.  She's asking if a z-pack can be called in for her instead to [employer].  Please let her know.

## 2011-06-17 ENCOUNTER — Telehealth: Payer: Self-pay | Admitting: Family Medicine

## 2011-06-17 ENCOUNTER — Other Ambulatory Visit: Payer: Self-pay | Admitting: *Deleted

## 2011-06-17 MED ORDER — POTASSIUM CHLORIDE ER 10 MEQ PO TBCR: 10.0000 meq | EXTENDED_RELEASE_TABLET | Freq: Every day | ORAL | Status: DC

## 2011-06-17 NOTE — Telephone Encounter (Signed)
Triage Record Num: [id number] Operator: Remonia Richter Patient Name: Amber Rollins Call Date & Time: 06/17/2011 4:02:12PM Patient Phone: [phone] PCP: Audrie Gallus. Tower Patient Gender: Female PCP Fax : Patient DOB: [birth year] Practice Name: [facility] Reason for Call: Caller: Amber Rollins/Patient is calling with a question about Potassium (not Received At [facility]). She was already taken care of with RN callback Protocol(s) Used: Information Only Call; No Symptom Triage (Adult) Recommended Outcome per Protocol: Provide Information or Advice Only Reason for Outcome: Health information question; no triage required. Information provided from approved references or clinical experience. Care Advice: ~

## 2011-06-18 ENCOUNTER — Telehealth: Payer: Self-pay | Admitting: Family Medicine

## 2011-06-18 NOTE — Telephone Encounter (Signed)
I checked with [facility] [address] because we got a faxed med request and K med was ready for pt pick up.

## 2011-06-18 NOTE — Telephone Encounter (Signed)
Triage Record Num: [id number] Operator: Remonia Richter Patient Name: Kama Cammarano Call Date & Time: 06/17/2011 4:02:12PM Patient Phone: [phone] PCP: Audrie Gallus. Tower Patient Gender: Female PCP Fax : Patient DOB: [birth year] Practice Name: [facility] Reason for Call: Caller: Fidelis/Patient is calling with a question about Potassium (not Received At [facility]). She was already taken care of with RN callback Protocol(s) Used: Information Only Call; No Symptom Triage (Adult) Recommended Outcome per Protocol: Provide Information or Advice Only Reason for Outcome: Health information question; no triage required. Information provided from approved references or clinical experience. Care Advice: ~ 06/17/2011 4:34:41PM Page 1 of 1 [email or web address]

## 2011-06-18 NOTE — Telephone Encounter (Signed)
Ok- let me know if any problems with that

## 2011-06-29 ENCOUNTER — Ambulatory Visit (INDEPENDENT_AMBULATORY_CARE_PROVIDER_SITE_OTHER): Payer: MEDICARE | Admitting: Family Medicine

## 2011-06-29 ENCOUNTER — Encounter: Payer: Self-pay | Admitting: Family Medicine

## 2011-06-29 VITALS — BP 144/82 | HR 76 | Temp 98.3°F | Ht 63.5 in | Wt 146.5 lb

## 2011-06-29 DIAGNOSIS — F411 Generalized anxiety disorder: Secondary | ICD-10-CM

## 2011-06-29 MED ORDER — PAROXETINE HCL 10 MG PO TABS: 10.0000 mg | ORAL_TABLET | Freq: Every day | ORAL | Status: DC

## 2011-06-29 NOTE — Assessment & Plan Note (Signed)
Pt has hx of waxing and waning anx in past due to stressors Now exac by dealing with son who has bipolar disorder  Rev symptoms / coping tech/ support sources/ tx options/ imp of counseling and side eff of med  Will re start low dose paxil that has worked well for her in the past  Rev poss side eff incl worse depression or suicidal thoughts- will call if any problems F/u 6-8 weeks Recommend calling if interested in counseling at any time  >25 min spent with face to face with patient, >50% counseling and/or coordinating care

## 2011-06-29 NOTE — Progress Notes (Signed)
Subjective:    Patient ID: Amber Rollins, female    DOB: Feb 06, 1931, 78 y.o.   MRN: [id number]  HPI Here for chronic medical problem to discuss/ with medication   Is stressed and very anxious  Just found out her 57 year old son is bipolar -- just 2 weeks ago  Not threatened or hurt in any way  He has been very reckless and spending lots of money (she had to help him a little financially)  He is demanding of everybody -- very irritable   She herself used to take paxil for anxiety  No problems with that  She is having trouble waking up in the middle of the night -- worrying and unable to sleep  Is nervous - says she has "nerve problems"  Appetite is fair -not as good as usual / not as hungry  Is talking to her family and friends - good support to her   Not tearful but feeling a bit down also   Patient Active Problem List  Diagnoses  . HYPERLIPIDEMIA  . HYPOKALEMIA  . ANXIETY STATE, UNSPECIFIED  . REACTION, ACUTE STRESS W/EMOTIONAL DSTURB  . HYPERTENSION  . ALLERGIC RHINITIS, SEASONAL  . GERD  . IRRITABLE BOWEL SYNDROME  . HEMATURIA UNSPECIFIED  . FIBROCYSTIC BREAST DISEASE  . OVARIAN MASS  . MENOPAUSAL SYNDROME  . OSTEOARTHRITIS  . FOOT PAIN, CHRONIC  . OSTEOPENIA  . FATIGUE  . NIGHT SWEATS  . EDEMA  . INCONTINENCE, URGE  . HEMORRHOIDS, HX OF  . HYSTERECTOMY, HX OF  . Gastroenteritis  . Hypokalemia  . URI, acute  . Sinusitis   Past Medical History  Diagnosis Date  . Hyperlipemia 05/31/1992  . Hypertension 05/31/2000  . Osteopenia 11/29/1999  . Osteoarthritis   . Pedal edema     worsened by Norvasc  . Chronic foot pain     mortons neuroma  . Plantar fasciitis   . Menopausal symptoms     vasomotor symptoms-severe  . Cystocele 01/01    neg. sx  . Hemorrhoids   . fracture fibula ? 2003    left   Past Surgical History  Procedure Date  . Partial hysterectomy 1970    endometriosis  . Foot problems     neuromas  . Anterior repari w//surg proc  06/30/99    RSO  . History of ct     mass right adnexa, U?S by GYN-observation   History  Substance Use Topics  . Smoking status: Never Smoker   . Smokeless tobacco: Not on file  . Alcohol Use: Not on file   Family History  Problem Relation Age of Onset  . Diabetes Mother   . Heart disease Mother     pacemaker  . Hypertension Mother   . COPD Father   . Diabetes Sister   . Diabetes Brother   . Mental illness Son     anxiety-mental health problems  . Diabetes Brother   . Diabetes Sister   . Diabetes Sister   . Hypertension Other    Allergies  Allergen Reactions  . Tdap (Adacel) Swelling  . Amlodipine Besylate     REACTION: edema  . Codeine     headache  . Latex   . Lovastatin     REACTION: leg pain  . Sertraline Hcl     REACTION: caused insomnia  . Simvastatin     REACTION: non tolerant- leg pain  . Sulfonamide Derivatives    Current Outpatient Prescriptions on File Prior to Visit  Medication Sig Dispense Refill  . azelastine (ASTELIN) 137 MCG/SPRAY nasal spray 1 spray by Nasal route daily.        . Calcium Carb-Cholecalciferol (CALCIUM 1000 + D PO) Take 1 capsule by mouth daily.       . hydrochlorothiazide (HYDRODIURIL) 25 MG tablet Take 2 by mouth daily  60 tablet  5  . Multiple Vitamins-Minerals (CENTRUM CARDIO PO) Take by mouth daily.        . NON FORMULARY Allergy shots, every other week       . Omega-3 Fatty Acids (FISH OIL) 1200 MG CAPS Take 2 by mouth daily       . omeprazole (PRILOSEC) 20 MG capsule Take 1 capsule (20 mg total) by mouth daily.  90 capsule  3  . potassium chloride (K-DUR) 10 MEQ tablet Take 1 tablet (10 mEq total) by mouth daily.  30 tablet  3  . Cyanocobalamin 1000 MCG CAPS Take 1 capsule by mouth daily.        . fexofenadine (ALLEGRA) 180 MG tablet Take 180 mg by mouth daily as needed.        . fluticasone (FLONASE) 50 MCG/ACT nasal spray 1-2 sprays each nostril daily.            Review of Systems Review of Systems    Constitutional: Negative for fever, appetite change, fatigue and unexpected weight change.  Eyes: Negative for pain and visual disturbance.  Respiratory: Negative for cough and shortness of breath.   Cardiovascular: Negative for cp or palpitations    Gastrointestinal: Negative for nausea, diarrhea and constipation.  Genitourinary: Negative for urgency and frequency.  Skin: Negative for pallor or rash   Neurological: Negative for weakness, light-headedness, numbness and headaches.  Hematological: Negative for adenopathy. Does not bruise/bleed easily.  Psychiatric/Behavioral:pos for dysphoric mood and anxiety/ worry- no SI         Objective:   Physical Exam  Constitutional: She appears well-developed and well-nourished. No distress.  HENT:  Head: Normocephalic.  Mouth/Throat: Oropharynx is clear and moist.  Eyes: Conjunctivae and EOM are normal. Pupils are equal, round, and reactive to light.  Neck: Normal range of motion. Neck supple. No thyromegaly present.  Cardiovascular: Normal rate, regular rhythm, normal heart sounds and intact distal pulses.   Pulmonary/Chest: Effort normal and breath sounds normal. No respiratory distress. She has no wheezes.  Abdominal: Soft. Bowel sounds are normal.  Lymphadenopathy:    She has no cervical adenopathy.  Neurological: She is alert. She has normal reflexes. She displays no tremor. She exhibits normal muscle tone. Coordination normal.  Skin: Skin is warm and dry. No rash noted. No erythema. No pallor.  Psychiatric: Her speech is normal and behavior is normal. Judgment and thought content normal. Her mood appears anxious. Her affect is not angry, not blunt and not labile. Cognition and memory are normal. She exhibits a depressed mood. She expresses no suicidal plans.       Good eye contact and communication skills She is attentive.          Assessment & Plan:

## 2011-06-29 NOTE — Patient Instructions (Addendum)
For anxiety - start regular exercise indoors or out-this really helps  Keep talking to friends and family - and if you are interested in counseling in the future - please let me know and I will do a referral  Start paxil 10 mg daily -- update me if severe side effects or if you get worse  Follow up with me in 6-8 weeks

## 2011-07-26 ENCOUNTER — Ambulatory Visit (INDEPENDENT_AMBULATORY_CARE_PROVIDER_SITE_OTHER): Payer: Medicare Other | Admitting: Family Medicine

## 2011-07-26 ENCOUNTER — Encounter: Payer: Self-pay | Admitting: Family Medicine

## 2011-07-26 VITALS — BP 138/72 | HR 80 | Temp 98.3°F | Ht 63.5 in | Wt 142.2 lb

## 2011-07-26 DIAGNOSIS — B9689 Other specified bacterial agents as the cause of diseases classified elsewhere: Secondary | ICD-10-CM

## 2011-07-26 DIAGNOSIS — J329 Chronic sinusitis, unspecified: Secondary | ICD-10-CM

## 2011-07-26 DIAGNOSIS — A499 Bacterial infection, unspecified: Secondary | ICD-10-CM

## 2011-07-26 DIAGNOSIS — F411 Generalized anxiety disorder: Secondary | ICD-10-CM

## 2011-07-26 MED ORDER — ALPRAZOLAM 0.5 MG PO TABS: 0.5000 mg | ORAL_TABLET | Freq: Every day | ORAL | Status: AC | PRN

## 2011-07-26 MED ORDER — AZITHROMYCIN 250 MG PO TABS: ORAL_TABLET | ORAL | Status: AC

## 2011-07-26 NOTE — Patient Instructions (Addendum)
Take the zpak for sinus infection The cold you have will last probably a few more weeks Drink lots of fluids and rest  Use nasal saline spray if it helps Stay on your allergy medicines Update if not starting to improve in a week or if worsening   Use xanax sparingly - just when really needed

## 2011-07-26 NOTE — Assessment & Plan Note (Signed)
Pt states she is doing better and does not need daily paxil  Will refil xanax .5 for emergency use -that gives her peace of mind Disc stressors and coping tech Adv to f/u if anxiety worsens Glad she is doing better

## 2011-07-26 NOTE — Progress Notes (Signed)
Subjective:    Patient ID: Amber Rollins, female    DOB: 20-Aug-1930, 78 y.o.   MRN: [id number]  HPI Sick for over a week  Blowing out yellow mucous - and thinks she has a sinus infx  Post nasal drip creates nausea Fever for few nights with chills and aches  Taking mucinex D  Also regular allergy medicines  Some cough-is mild overall - used otc cough med   A lot of sinus pain around eyes   For anxiety pt no longer takes paxil - does not need daily med Has xanax to take prn infrequently and needs px  Overall is feeling better Glad she does not need daily med at this time Stress has settled down a bit Still a lot of demands on her   Patient Active Problem List  Diagnoses  . HYPERLIPIDEMIA  . HYPOKALEMIA  . ANXIETY STATE, UNSPECIFIED  . REACTION, ACUTE STRESS W/EMOTIONAL DSTURB  . HYPERTENSION  . ALLERGIC RHINITIS, SEASONAL  . GERD  . IRRITABLE BOWEL SYNDROME  . HEMATURIA UNSPECIFIED  . FIBROCYSTIC BREAST DISEASE  . OVARIAN MASS  . MENOPAUSAL SYNDROME  . OSTEOARTHRITIS  . FOOT PAIN, CHRONIC  . OSTEOPENIA  . FATIGUE  . NIGHT SWEATS  . EDEMA  . INCONTINENCE, URGE  . HEMORRHOIDS, HX OF  . HYSTERECTOMY, HX OF  . Gastroenteritis  . Hypokalemia  . Sinusitis, bacterial   Past Medical History  Diagnosis Date  . Hyperlipemia 05/31/1992  . Hypertension 05/31/2000  . Osteopenia 11/29/1999  . Osteoarthritis   . Pedal edema     worsened by Norvasc  . Chronic foot pain     mortons neuroma  . Plantar fasciitis   . Menopausal symptoms     vasomotor symptoms-severe  . Cystocele 01/01    neg. sx  . Hemorrhoids   . fracture fibula ? 2003    left   Past Surgical History  Procedure Date  . Partial hysterectomy 1970    endometriosis  . Foot problems     neuromas  . Anterior repari w//surg proc 06/30/99    RSO  . History of ct     mass right adnexa, U?S by GYN-observation   History  Substance Use Topics  . Smoking status: Never Smoker   . Smokeless  tobacco: Not on file  . Alcohol Use: Not on file   Family History  Problem Relation Age of Onset  . Diabetes Mother   . Heart disease Mother     pacemaker  . Hypertension Mother   . COPD Father   . Diabetes Sister   . Diabetes Brother   . Mental illness Son     anxiety-mental health problems  . Diabetes Brother   . Diabetes Sister   . Diabetes Sister   . Hypertension Other    Allergies  Allergen Reactions  . Tdap (Adacel) Swelling  . Amlodipine Besylate     REACTION: edema  . Codeine     headache  . Latex   . Lovastatin     REACTION: leg pain  . Penicillins     ? What reaction   . Sertraline Hcl     REACTION: caused insomnia  . Simvastatin     REACTION: non tolerant- leg pain  . Sulfonamide Derivatives    Current Outpatient Prescriptions on File Prior to Visit  Medication Sig Dispense Refill  . azelastine (ASTELIN) 137 MCG/SPRAY nasal spray 1 spray by Nasal route daily.        Marland Kitchen  Calcium Carb-Cholecalciferol (CALCIUM 1000 + D PO) Take 1 capsule by mouth daily.       . fexofenadine (ALLEGRA) 180 MG tablet Take 180 mg by mouth daily as needed.        . fluticasone (FLONASE) 50 MCG/ACT nasal spray 1-2 sprays each nostril daily.      . hydrochlorothiazide (HYDRODIURIL) 25 MG tablet Take 2 by mouth daily  60 tablet  5  . Multiple Vitamins-Minerals (CENTRUM CARDIO PO) Take by mouth daily.        . NON FORMULARY Allergy shots, every other week       . Omega-3 Fatty Acids (FISH OIL) 1200 MG CAPS Take 2 by mouth daily       . omeprazole (PRILOSEC) 20 MG capsule Take 1 capsule (20 mg total) by mouth daily.  90 capsule  3  . potassium chloride (K-DUR) 10 MEQ tablet Take 1 tablet (10 mEq total) by mouth daily.  30 tablet  3  . Cyanocobalamin 1000 MCG CAPS Take 1 capsule by mouth daily.             Review of Systems Review of Systems  Constitutional: Negative for , appetite change, and unexpected weight change.  Eyes: Negative for pain and visual disturbance.  ENt pos for  sinus pain and cong / neg for st or ear pain  Respiratory: Negative for sob or wheeze.   Cardiovascular: Negative for cp or palpitations    Gastrointestinal: Negative for nausea, diarrhea and constipation.  Genitourinary: Negative for urgency and frequency.  Skin: Negative for pallor or rash   Neurological: Negative for weakness, light-headedness, numbness and headaches.  Hematological: Negative for adenopathy. Does not bruise/bleed easily.  Psychiatric/Behavioral: Negative for dysphoric mood. The patient is not nervous/anxious.          Objective:   Physical Exam  Constitutional: She appears well-developed and well-nourished. No distress.       Well appearing elderly female  HENT:  Head: Normocephalic and atraumatic.  Right Ear: External ear normal.  Left Ear: External ear normal.  Mouth/Throat: Oropharynx is clear and moist. No oropharyngeal exudate.       Nares are injected and congested   Marked sinus tenderness frontal and maxillary bilateral TMs dull but clear Throat- post nasal drip  Eyes: Conjunctivae and EOM are normal. Pupils are equal, round, and reactive to light. Right eye exhibits no discharge. Left eye exhibits no discharge.  Neck: Normal range of motion. Neck supple. No JVD present. Carotid bruit is not present. No thyromegaly present.  Cardiovascular: Normal rate, regular rhythm and normal heart sounds.   No murmur heard. Pulmonary/Chest: Effort normal and breath sounds normal. No respiratory distress. She has no wheezes. She has no rales.       Good air exch  Abdominal: Soft. Bowel sounds are normal. She exhibits no distension and no mass. There is no tenderness.  Musculoskeletal: Normal range of motion. She exhibits no edema and no tenderness.  Lymphadenopathy:    She has no cervical adenopathy.  Neurological: She is alert. She has normal reflexes. No cranial nerve deficit. She exhibits normal muscle tone. Coordination normal.  Skin: Skin is warm and dry. No  rash noted. No erythema. No pallor.  Psychiatric: Her speech is normal and behavior is normal. Thought content normal. Her mood appears not anxious. Her affect is not blunt and not labile. She does not exhibit a depressed mood. She expresses no suicidal plans.       Seems fatigued/ not  anxious today Talkative           Assessment & Plan:

## 2011-07-26 NOTE — Assessment & Plan Note (Signed)
After uri with purulent nasal drainage/facial pain/ fever tx with zpak (? All to pcn) Disc symptomatic care - see instructions on AVS Update if not starting to improve in a week or if worsening

## 2011-08-17 ENCOUNTER — Ambulatory Visit: Payer: Medicare Other | Admitting: Family Medicine

## 2011-08-23 ENCOUNTER — Other Ambulatory Visit: Payer: Self-pay | Admitting: *Deleted

## 2011-08-23 MED ORDER — HYDROCHLOROTHIAZIDE 25 MG PO TABS: ORAL_TABLET | ORAL | Status: DC

## 2011-08-23 NOTE — Telephone Encounter (Signed)
Will refill electronically  

## 2011-08-23 NOTE — Telephone Encounter (Signed)
Received faxed refill request from pharmacy. See allergy/contraindication. Is it okay to refill medication? 

## 2011-09-27 ENCOUNTER — Other Ambulatory Visit: Payer: Self-pay | Admitting: *Deleted

## 2011-09-27 MED ORDER — OMEPRAZOLE 20 MG PO CPDR: 20.0000 mg | DELAYED_RELEASE_CAPSULE | Freq: Every day | ORAL | Status: DC

## 2011-10-12 ENCOUNTER — Other Ambulatory Visit: Payer: Self-pay | Admitting: Family Medicine

## 2011-11-04 ENCOUNTER — Encounter: Payer: Self-pay | Admitting: Family Medicine

## 2011-12-24 ENCOUNTER — Ambulatory Visit (INDEPENDENT_AMBULATORY_CARE_PROVIDER_SITE_OTHER): Payer: Medicare Other | Admitting: Family Medicine

## 2011-12-24 ENCOUNTER — Encounter: Payer: Self-pay | Admitting: Family Medicine

## 2011-12-24 VITALS — BP 130/82 | HR 69 | Temp 97.8°F | Ht 63.5 in | Wt 142.4 lb

## 2011-12-24 DIAGNOSIS — R319 Hematuria, unspecified: Secondary | ICD-10-CM

## 2011-12-24 DIAGNOSIS — M545 Low back pain, unspecified: Secondary | ICD-10-CM

## 2011-12-24 DIAGNOSIS — K649 Unspecified hemorrhoids: Secondary | ICD-10-CM

## 2011-12-24 DIAGNOSIS — R300 Dysuria: Secondary | ICD-10-CM

## 2011-12-24 LAB — POCT URINALYSIS DIPSTICK
Bilirubin, UA: NEGATIVE
Ketones, UA: NEGATIVE
Leukocytes, UA: NEGATIVE
Spec Grav, UA: 1.025
pH, UA: 6

## 2011-12-24 NOTE — Patient Instructions (Addendum)
Good to see you. There was some blood in your urine- we will call you next week with your urine cx.   Please go to ER if your symptoms worsen over the weekend.

## 2011-12-24 NOTE — Progress Notes (Signed)
Subjective:    Patient ID: Amber Rollins, female    DOB: 11-17-1930, 78 y.o.   MRN: [id number]  HPI 78 yo pt of Dr. Milinda Antis here ?hemorrhoid pain. Chief complaint states that it is severe pain but pt states it is "her normal hemorroid pain." No blood from stool. No pain with defecation. Using anusol. No itching.  Points to her lower back when asked where she is hurting. She does endorse some dysuria.  Has had muscle aches for  about a week.  Does have a h/o hypokalemia (during GI virus) and she is concerned that her potassium may be low again.  No h/o kidney stones.    Patient Active Problem List  Diagnosis  . HYPERLIPIDEMIA  . HYPOKALEMIA  . ANXIETY STATE, UNSPECIFIED  . REACTION, ACUTE STRESS W/EMOTIONAL DSTURB  . HYPERTENSION  . ALLERGIC RHINITIS, SEASONAL  . GERD  . IRRITABLE BOWEL SYNDROME  . HEMATURIA UNSPECIFIED  . FIBROCYSTIC BREAST DISEASE  . OVARIAN MASS  . MENOPAUSAL SYNDROME  . OSTEOARTHRITIS  . FOOT PAIN, CHRONIC  . OSTEOPENIA  . FATIGUE  . NIGHT SWEATS  . EDEMA  . INCONTINENCE, URGE  . HEMORRHOIDS, HX OF  . HYSTERECTOMY, HX OF  . Gastroenteritis  . Hypokalemia  . Sinusitis, bacterial  . Hemorrhoid   Past Medical History  Diagnosis Date  . Hyperlipemia 05/31/1992  . Hypertension 05/31/2000  . Osteopenia 11/29/1999  . Osteoarthritis   . Pedal edema     worsened by Norvasc  . Chronic foot pain     mortons neuroma  . Plantar fasciitis   . Menopausal symptoms     vasomotor symptoms-severe  . Cystocele 01/01    neg. sx  . Hemorrhoids   . fracture fibula ? 2003    left   Past Surgical History  Procedure Date  . Partial hysterectomy 1970    endometriosis  . Foot problems     neuromas  . Anterior repari w//surg proc 06/30/99    RSO  . History of ct     mass right adnexa, U?S by GYN-observation   History  Substance Use Topics  . Smoking status: Never Smoker   . Smokeless tobacco: Not on file  . Alcohol Use: Not on file    Family History  Problem Relation Age of Onset  . Diabetes Mother   . Heart disease Mother     pacemaker  . Hypertension Mother   . COPD Father   . Diabetes Sister   . Diabetes Brother   . Mental illness Son     anxiety-mental health problems  . Diabetes Brother   . Diabetes Sister   . Diabetes Sister   . Hypertension Other    Allergies  Allergen Reactions  . Tdap (Diphth-Acell Pertussis-Tetanus) Swelling  . Amlodipine Besylate     REACTION: edema  . Codeine     headache  . Latex   . Lovastatin     REACTION: leg pain  . Penicillins     ? What reaction   . Sertraline Hcl     REACTION: caused insomnia  . Simvastatin     REACTION: non tolerant- leg pain  . Sulfonamide Derivatives    Current Outpatient Prescriptions on File Prior to Visit  Medication Sig Dispense Refill  . azelastine (ASTELIN) 137 MCG/SPRAY nasal spray 1 spray by Nasal route daily.        . Calcium Carb-Cholecalciferol (CALCIUM 1000 + D PO) Take 1 capsule by mouth daily.       Marland Kitchen  Cyanocobalamin 1000 MCG CAPS Take 1 capsule by mouth daily.        . fexofenadine (ALLEGRA) 180 MG tablet Take 180 mg by mouth daily as needed.        . fluticasone (FLONASE) 50 MCG/ACT nasal spray 1-2 sprays each nostril daily.      . hydrochlorothiazide (HYDRODIURIL) 25 MG tablet Take 2 by mouth daily  60 tablet  5  . KLOR-CON M10 10 MEQ tablet TAKE 1 TABLET (10 MEQ TOTAL) BY MOUTH DAILY.  30 tablet  6  . Multiple Vitamins-Minerals (CENTRUM CARDIO PO) Take by mouth daily.        . NON FORMULARY Allergy shots, every other week       . Omega-3 Fatty Acids (FISH OIL) 1200 MG CAPS Take 2 by mouth daily       . omeprazole (PRILOSEC) 20 MG capsule Take 1 capsule (20 mg total) by mouth daily.  90 capsule  3  . pseudoephedrine-guaifenesin (MUCINEX D) 60-600 MG per tablet Take 1 tablet by mouth every 12 (twelve) hours.      Marland Kitchen DISCONTD: potassium chloride (K-DUR) 10 MEQ tablet Take 1 tablet (10 mEq total) by mouth daily.  30 tablet  3    The PMH, PSH, Social History, Family History, Medications, and allergies have been reviewed in [facility], and have been updated if relevant.    Review of Systems See HPI No fevers. No n/v/d.    Objective:   Physical Exam BP 130/82  Pulse 69  Temp 97.8 F (36.6 C) (Oral)  Ht 5' 3.5" (1.613 m)  Wt 142 lb 6.4 oz (64.592 kg)  BMI 24.83 kg/m2  SpO2 97% Gen:  Alert, pleasant, NAD Abd:  Soft, NT No CVA tenderness Ext:  No edema Psych:  Anxious but otherwise normal affect and speech. Rectal:  Small external hemorroid, non thrombosed, otherwise unremarkable. MSK:  paraspinous NTTP, SLR neg bilaterally.    Assessment & Plan:   1. Hemorrhoid  Small, non thrombosed.  Continue supportive care with sitz baths and anusol. The patient indicates understanding of these issues and agrees with the plan.   2. Dysuria  UA positive for blood only. ? Possible kidney stone- advised CT of abdomen and pelvis- pt declined. Will send urine for cx. If symptoms worsen over weekend- advised to go to ER. The patient indicates understanding of these issues and agrees with the plan.   3. Low back pain  MSK vs kidney stone.  See above.

## 2011-12-24 NOTE — Addendum Note (Signed)
Addended by: Consuello Masse on: 12/24/2011 03:32 PM   Modules accepted: Orders

## 2011-12-24 NOTE — Addendum Note (Signed)
Addended by: Alvina Chou on: 12/24/2011 03:12 PM   Modules accepted: Orders

## 2011-12-25 LAB — COMPREHENSIVE METABOLIC PANEL
ALT: 16 U/L (ref 0–35)
AST: 17 U/L (ref 0–37)
Albumin: 4 g/dL (ref 3.5–5.2)
Alkaline Phosphatase: 50 U/L (ref 39–117)
BUN: 20 mg/dL (ref 6–23)
Calcium: 9.7 mg/dL (ref 8.4–10.5)
Chloride: 98 mEq/L (ref 96–112)
Potassium: 3.5 mEq/L (ref 3.5–5.3)

## 2012-01-24 ENCOUNTER — Encounter: Payer: Self-pay | Admitting: Family Medicine

## 2012-01-24 ENCOUNTER — Ambulatory Visit (INDEPENDENT_AMBULATORY_CARE_PROVIDER_SITE_OTHER): Payer: Medicare Other | Admitting: Family Medicine

## 2012-01-24 VITALS — BP 118/62 | HR 74 | Temp 98.2°F | Ht 63.0 in | Wt 140.8 lb

## 2012-01-24 DIAGNOSIS — K219 Gastro-esophageal reflux disease without esophagitis: Secondary | ICD-10-CM

## 2012-01-24 DIAGNOSIS — R319 Hematuria, unspecified: Secondary | ICD-10-CM

## 2012-01-24 LAB — POCT URINALYSIS DIPSTICK
Bilirubin, UA: NEGATIVE
Glucose, UA: NEGATIVE
Spec Grav, UA: 1.01
pH, UA: 7

## 2012-01-24 LAB — POCT UA - MICROSCOPIC ONLY

## 2012-01-24 NOTE — Patient Instructions (Addendum)
Increase your prilosec to twice daily (am and pm) for 4-6 weeks and then follow up with me  Avoid foods that aggrivate you - spicy/ acidic/ fatty and do not eat late at night We will refer you to urology at check out for the microscopic blood in urine and bladder discomfort

## 2012-01-24 NOTE — Assessment & Plan Note (Signed)
More indigestion lately- will increase her omeprazole to bid  Will update Disc diet Pt wants to avoid another colonosc due to prep -- has last one 1999- very difficult time with that

## 2012-01-24 NOTE — Progress Notes (Signed)
Subjective:    Patient ID: Amber Rollins, female    DOB: [birth year], 78 y.o.   MRN: [id number]  HPI Here for re check of urine Saw Dr Dayton Martes last visit -primary for hemorroids Also had back pain and ua with blood- her cx came back neg She declined CT scan at that point  Still having a little suprapubic pressure Also in low back/ buttocks Indigestion and a lot of burping   Takes prilosec every day  Still gets a little heartburn  A little nausea off and on  No vomiting  No dark stool or epigastric pain   Gets hem problems when she has loose stools  occ loose stools    Patient Active Problem List  Diagnosis  . HYPERLIPIDEMIA  . HYPOKALEMIA  . ANXIETY STATE, UNSPECIFIED  . REACTION, ACUTE STRESS W/EMOTIONAL DSTURB  . HYPERTENSION  . ALLERGIC RHINITIS, SEASONAL  . GERD  . IRRITABLE BOWEL SYNDROME  . HEMATURIA UNSPECIFIED  . FIBROCYSTIC BREAST DISEASE  . OVARIAN MASS  . MENOPAUSAL SYNDROME  . OSTEOARTHRITIS  . FOOT PAIN, CHRONIC  . OSTEOPENIA  . FATIGUE  . NIGHT SWEATS  . EDEMA  . INCONTINENCE, URGE  . HEMORRHOIDS, HX OF  . HYSTERECTOMY, HX OF  . Gastroenteritis  . Hypokalemia  . Sinusitis, bacterial  . Hemorrhoid  . Dysuria  . Low back pain  . Hematuria   Past Medical History  Diagnosis Date  . Hyperlipemia 05/31/1992  . Hypertension 05/31/2000  . Osteopenia 11/29/1999  . Osteoarthritis   . Pedal edema     worsened by Norvasc  . Chronic foot pain     mortons neuroma  . Plantar fasciitis   . Menopausal symptoms     vasomotor symptoms-severe  . Cystocele 01/01    neg. sx  . Hemorrhoids   . fracture fibula ? 2003    left   Past Surgical History  Procedure Date  . Partial hysterectomy 1970    endometriosis  . Foot problems     neuromas  . Anterior repari w//surg proc 06/30/99    RSO  . History of ct     mass right adnexa, U?S by GYN-observation   History  Substance Use Topics  . Smoking status: Never Smoker   . Smokeless tobacco:  Not on file  . Alcohol Use: Not on file   Family History  Problem Relation Age of Onset  . Diabetes Mother   . Heart disease Mother     pacemaker  . Hypertension Mother   . COPD Father   . Diabetes Sister   . Diabetes Brother   . Mental illness Son     anxiety-mental health problems  . Diabetes Brother   . Diabetes Sister   . Diabetes Sister   . Hypertension Other    Allergies  Allergen Reactions  . Tdap (Diphth-Acell Pertussis-Tetanus) Swelling  . Amlodipine Besylate     REACTION: edema  . Codeine     headache  . Latex   . Lovastatin     REACTION: leg pain  . Penicillins     ? What reaction   . Sertraline Hcl     REACTION: caused insomnia  . Simvastatin     REACTION: non tolerant- leg pain  . Sulfonamide Derivatives    Current Outpatient Prescriptions on File Prior to Visit  Medication Sig Dispense Refill  . Calcium Carb-Cholecalciferol (CALCIUM 1000 + D PO) Take 1 capsule by mouth daily.       . Cyanocobalamin  1000 MCG CAPS Take 1 capsule by mouth daily.        . fexofenadine (ALLEGRA) 180 MG tablet Take 180 mg by mouth daily as needed.        . fluticasone (FLONASE) 50 MCG/ACT nasal spray 1-2 sprays each nostril daily.      . hydrochlorothiazide (HYDRODIURIL) 25 MG tablet Take 2 by mouth daily  60 tablet  5  . KLOR-CON M10 10 MEQ tablet TAKE 1 TABLET (10 MEQ TOTAL) BY MOUTH DAILY.  30 tablet  6  . Multiple Vitamins-Minerals (CENTRUM CARDIO PO) Take by mouth daily.        . NON FORMULARY Allergy shots, every other week       . Omega-3 Fatty Acids (FISH OIL) 1200 MG CAPS Take 2 by mouth daily       . omeprazole (PRILOSEC) 20 MG capsule Take 1 capsule (20 mg total) by mouth daily.  90 capsule  3  . azelastine (ASTELIN) 137 MCG/SPRAY nasal spray 1 spray by Nasal route daily.        . pseudoephedrine-guaifenesin (MUCINEX D) 60-600 MG per tablet Take 1 tablet by mouth every 12 (twelve) hours.      Marland Kitchen DISCONTD: potassium chloride (K-DUR) 10 MEQ tablet Take 1 tablet (10  mEq total) by mouth daily.  30 tablet  3        Review of Systems Review of Systems  Constitutional: Negative for fever, appetite change, fatigue and unexpected weight change.  Eyes: Negative for pain and visual disturbance.  Respiratory: Negative for cough and shortness of breath.   Cardiovascular: Negative for cp or palpitations    Gastrointestinal: Negative for nausea, diarrhea and constipation. pos for acid reflux/ heartburn Genitourinary: Negative for urgency and frequency.pos for bladder discomfort   Skin: Negative for pallor or rash   Neurological: Negative for weakness, light-headedness, numbness and headaches.  Hematological: Negative for adenopathy. Does not bruise/bleed easily.  Psychiatric/Behavioral: Negative for dysphoric mood. The patient is not nervous/anxious.         Objective:   Physical Exam  Constitutional: She appears well-developed and well-nourished. No distress.  HENT:  Head: Normocephalic and atraumatic.  Mouth/Throat: Oropharynx is clear and moist.  Eyes: Conjunctivae and EOM are normal. Pupils are equal, round, and reactive to light. No scleral icterus.  Neck: Normal range of motion. Neck supple. No JVD present. Carotid bruit is not present. No thyromegaly present.  Cardiovascular: Normal rate, regular rhythm, normal heart sounds and intact distal pulses.  Exam reveals no gallop.   Pulmonary/Chest: Effort normal and breath sounds normal. No respiratory distress. She has no wheezes.  Abdominal: Soft. Bowel sounds are normal. She exhibits no distension and no mass. There is no tenderness. There is no rebound and no guarding.  Musculoskeletal: She exhibits no edema.  Lymphadenopathy:    She has no cervical adenopathy.  Neurological: She is alert. She has normal reflexes. No cranial nerve deficit. She exhibits normal muscle tone. Coordination normal.  Skin: Skin is warm and dry. No erythema. No pallor.  Psychiatric: She has a normal mood and affect.           Assessment & Plan:

## 2012-01-24 NOTE — Assessment & Plan Note (Addendum)
Discovered at last visit - rev that note/ labs with pt  Her ucx was neg Still has microscopic blood in urine  Still has bladder discomfort Neg ua today other than blood Ref to urology

## 2012-02-02 ENCOUNTER — Telehealth: Payer: Self-pay

## 2012-02-02 NOTE — Telephone Encounter (Signed)
Pt wants to know what will be done at urology appt 02/03/12; advised pt usually does exam and then discusses options of how to proceed. Pt will call urologist to see if needs to take someone with her to drive.

## 2012-02-22 ENCOUNTER — Ambulatory Visit (INDEPENDENT_AMBULATORY_CARE_PROVIDER_SITE_OTHER): Payer: Medicare Other

## 2012-02-22 ENCOUNTER — Other Ambulatory Visit: Payer: Self-pay | Admitting: Family Medicine

## 2012-02-22 DIAGNOSIS — Z23 Encounter for immunization: Secondary | ICD-10-CM

## 2012-03-08 ENCOUNTER — Other Ambulatory Visit: Payer: Self-pay | Admitting: *Deleted

## 2012-03-08 MED ORDER — OMEPRAZOLE 20 MG PO CPDR: 20.0000 mg | DELAYED_RELEASE_CAPSULE | Freq: Two times a day (BID) | ORAL | Status: DC

## 2012-04-24 ENCOUNTER — Ambulatory Visit (INDEPENDENT_AMBULATORY_CARE_PROVIDER_SITE_OTHER): Payer: Medicare Other | Admitting: Family Medicine

## 2012-04-24 ENCOUNTER — Encounter: Payer: Self-pay | Admitting: Family Medicine

## 2012-04-24 VITALS — BP 152/74 | HR 65 | Temp 98.0°F | Ht 63.0 in | Wt 144.0 lb

## 2012-04-24 DIAGNOSIS — B9689 Other specified bacterial agents as the cause of diseases classified elsewhere: Secondary | ICD-10-CM

## 2012-04-24 DIAGNOSIS — J329 Chronic sinusitis, unspecified: Secondary | ICD-10-CM

## 2012-04-24 DIAGNOSIS — A499 Bacterial infection, unspecified: Secondary | ICD-10-CM

## 2012-04-24 MED ORDER — AZITHROMYCIN 250 MG PO TABS: ORAL_TABLET | ORAL | Status: DC

## 2012-04-24 NOTE — Progress Notes (Signed)
Subjective:    Patient ID: Amber Rollins, female    DOB: 03/25/1931, 78 y.o.   MRN: [id number]  HPI Here with uri symptoms Very congested since last week  Taking delsym cough syrup and mucinex  Is nauseated from post nasal drip   ? If more than a cold - thinks this is much more than a cold Some sweats and chills- thinks fever  Face is hurting - over and under eyes - like a sinus infection Is coughing some - is dry    Husband is sick- chemo and radiation This is difficult on her - caring for him  Does not want him to get sick She had her flu shot  Patient Active Problem List  Diagnosis  . HYPERLIPIDEMIA  . HYPOKALEMIA  . ANXIETY STATE, UNSPECIFIED  . REACTION, ACUTE STRESS W/EMOTIONAL DSTURB  . HYPERTENSION  . ALLERGIC RHINITIS, SEASONAL  . GERD  . IRRITABLE BOWEL SYNDROME  . HEMATURIA UNSPECIFIED  . FIBROCYSTIC BREAST DISEASE  . OVARIAN MASS  . MENOPAUSAL SYNDROME  . OSTEOARTHRITIS  . FOOT PAIN, CHRONIC  . OSTEOPENIA  . FATIGUE  . NIGHT SWEATS  . EDEMA  . INCONTINENCE, URGE  . HEMORRHOIDS, HX OF  . HYSTERECTOMY, HX OF  . Gastroenteritis  . Hypokalemia  . Sinusitis, bacterial  . Hemorrhoid  . Dysuria  . Low back pain  . Hematuria   Past Medical History  Diagnosis Date  . Hyperlipemia 05/31/1992  . Hypertension 05/31/2000  . Osteopenia 11/29/1999  . Osteoarthritis   . Pedal edema     worsened by Norvasc  . Chronic foot pain     mortons neuroma  . Plantar fasciitis   . Menopausal symptoms     vasomotor symptoms-severe  . Cystocele 01/01    neg. sx  . Hemorrhoids   . fracture fibula ? 2003    left   Past Surgical History  Procedure Date  . Partial hysterectomy 1970    endometriosis  . Foot problems     neuromas  . Anterior repari w//surg proc 06/30/99    RSO  . History of ct     mass right adnexa, U?S by GYN-observation   History  Substance Use Topics  . Smoking status: Never Smoker   . Smokeless tobacco: Not on file  . Alcohol  Use: No   Family History  Problem Relation Age of Onset  . Diabetes Mother   . Heart disease Mother     pacemaker  . Hypertension Mother   . COPD Father   . Diabetes Sister   . Diabetes Brother   . Mental illness Son     anxiety-mental health problems  . Diabetes Brother   . Diabetes Sister   . Diabetes Sister   . Hypertension Other    Allergies  Allergen Reactions  . Tdap (Diphth-Acell Pertussis-Tetanus) Swelling  . Amlodipine Besylate     REACTION: edema  . Codeine     headache  . Latex   . Lovastatin     REACTION: leg pain  . Penicillins     ? What reaction   . Sertraline Hcl     REACTION: caused insomnia  . Simvastatin     REACTION: non tolerant- leg pain  . Sulfonamide Derivatives    Current Outpatient Prescriptions on File Prior to Visit  Medication Sig Dispense Refill  . Calcium Carb-Cholecalciferol (CALCIUM 1000 + D PO) Take 1 capsule by mouth daily.       . fexofenadine (ALLEGRA) 180  MG tablet Take 180 mg by mouth daily as needed.        . fluticasone (FLONASE) 50 MCG/ACT nasal spray 1-2 sprays each nostril daily.      . hydrochlorothiazide (HYDRODIURIL) 25 MG tablet TAKE 2 TABLETS EVERY DAY  60 tablet  3  . KLOR-CON M10 10 MEQ tablet TAKE 1 TABLET (10 MEQ TOTAL) BY MOUTH DAILY.  30 tablet  6  . Multiple Vitamins-Minerals (CENTRUM CARDIO PO) Take by mouth daily.        . NON FORMULARY Allergy shots, every other week       . Omega-3 Fatty Acids (FISH OIL) 1200 MG CAPS Take 2 by mouth daily       . omeprazole (PRILOSEC) 20 MG capsule Take 1 capsule (20 mg total) by mouth 2 (two) times daily.  180 capsule  3  . PARoxetine (PAXIL) 10 MG tablet Take 10 mg by mouth daily.      . pseudoephedrine-guaifenesin (MUCINEX D) 60-600 MG per tablet Take 1 tablet by mouth every 12 (twelve) hours.      . [DISCONTINUED] potassium chloride (K-DUR) 10 MEQ tablet Take 1 tablet (10 mEq total) by mouth daily.  30 tablet  3         Review of Systems Review of Systems    Constitutional: Negative for fever, appetite change,  and unexpected weight change.  Eyes: Negative for pain and visual disturbance.  ENt pos for congestion and facial pain and purulent nasal drainage Respiratory: Negative for wheeze and shortness of breath.   Cardiovascular: Negative for cp or palpitations    Gastrointestinal: Negative for nausea, diarrhea and constipation.  Genitourinary: Negative for urgency and frequency.  Skin: Negative for pallor or rash   Neurological: Negative for weakness, light-headedness, numbness and headaches.  Hematological: Negative for adenopathy. Does not bruise/bleed easily.  Psychiatric/Behavioral: Negative for dysphoric mood. The patient is not nervous/anxious.         Objective:   Physical Exam  Constitutional: She appears well-developed and well-nourished. No distress.  HENT:  Head: Normocephalic and atraumatic.  Right Ear: External ear normal.  Left Ear: External ear normal.  Mouth/Throat: Oropharynx is clear and moist. No oropharyngeal exudate.       Nares are injected and congested  bilat maxillary and frontal sinus tenderness   Eyes: Conjunctivae normal and EOM are normal. Pupils are equal, round, and reactive to light. Right eye exhibits no discharge. Left eye exhibits no discharge.  Neck: Normal range of motion. Neck supple. No thyromegaly present.  Cardiovascular: Normal rate, regular rhythm, normal heart sounds and intact distal pulses.  Exam reveals no gallop.   Pulmonary/Chest: Effort normal and breath sounds normal. No respiratory distress. She has no wheezes. She has no rales. She exhibits no tenderness.  Lymphadenopathy:    She has no cervical adenopathy.  Neurological: She is alert. No cranial nerve deficit.  Skin: Skin is warm and dry. No rash noted. No erythema. No pallor.  Psychiatric: She has a normal mood and affect.          Assessment & Plan:

## 2012-04-24 NOTE — Assessment & Plan Note (Signed)
After a uri viral  tx with zithromax (pt is pcn allergic) - and this usually works well for her Disc symptomatic care - see instructions on AVS  Pt has immunocomp husband - disc ways to prevent spread of infx Update if not starting to improve in a week or if worsening

## 2012-04-24 NOTE — Patient Instructions (Addendum)
I think you have a cold that is turning into a sinus infection  Take the zpak as directed  mucinex for congestion Drink lots of fluids Wash hands/ wipe down surfaces and wear a mask to protect your husband from catching this

## 2012-05-12 ENCOUNTER — Other Ambulatory Visit: Payer: Self-pay | Admitting: Family Medicine

## 2012-06-12 ENCOUNTER — Other Ambulatory Visit: Payer: Self-pay | Admitting: Family Medicine

## 2012-07-07 ENCOUNTER — Other Ambulatory Visit: Payer: Self-pay | Admitting: Family Medicine

## 2012-07-10 NOTE — Telephone Encounter (Signed)
done

## 2012-07-10 NOTE — Telephone Encounter (Signed)
Ok to refill 

## 2012-07-10 NOTE — Telephone Encounter (Signed)
She can have 1 year of refils, thanks

## 2012-08-08 ENCOUNTER — Emergency Department (HOSPITAL_COMMUNITY): Payer: Medicare Other

## 2012-08-08 ENCOUNTER — Encounter (HOSPITAL_COMMUNITY): Payer: Self-pay | Admitting: Emergency Medicine

## 2012-08-08 ENCOUNTER — Inpatient Hospital Stay (HOSPITAL_COMMUNITY)
Admission: EM | Admit: 2012-08-08 | Discharge: 2012-08-14 | DRG: 184 | Disposition: A | Discharge status: Skilled Nursing Facility | Payer: Medicare Other | Attending: General Surgery | Admitting: General Surgery

## 2012-08-08 DIAGNOSIS — S2239XA Fracture of one rib, unspecified side, initial encounter for closed fracture: Secondary | ICD-10-CM

## 2012-08-08 DIAGNOSIS — K219 Gastro-esophageal reflux disease without esophagitis: Secondary | ICD-10-CM | POA: Diagnosis present

## 2012-08-08 DIAGNOSIS — S2220XA Unspecified fracture of sternum, initial encounter for closed fracture: Principal | ICD-10-CM | POA: Diagnosis present

## 2012-08-08 DIAGNOSIS — Z836 Family history of other diseases of the respiratory system: Secondary | ICD-10-CM

## 2012-08-08 DIAGNOSIS — S22009A Unspecified fracture of unspecified thoracic vertebra, initial encounter for closed fracture: Secondary | ICD-10-CM

## 2012-08-08 DIAGNOSIS — S91009A Unspecified open wound, unspecified ankle, initial encounter: Secondary | ICD-10-CM | POA: Diagnosis present

## 2012-08-08 DIAGNOSIS — S81009A Unspecified open wound, unspecified knee, initial encounter: Secondary | ICD-10-CM | POA: Diagnosis present

## 2012-08-08 DIAGNOSIS — Z818 Family history of other mental and behavioral disorders: Secondary | ICD-10-CM

## 2012-08-08 DIAGNOSIS — K589 Irritable bowel syndrome without diarrhea: Secondary | ICD-10-CM | POA: Diagnosis present

## 2012-08-08 DIAGNOSIS — Y93K9 Activity, other involving animal care: Secondary | ICD-10-CM

## 2012-08-08 DIAGNOSIS — Z8249 Family history of ischemic heart disease and other diseases of the circulatory system: Secondary | ICD-10-CM

## 2012-08-08 DIAGNOSIS — Z91040 Latex allergy status: Secondary | ICD-10-CM

## 2012-08-08 DIAGNOSIS — Z888 Allergy status to other drugs, medicaments and biological substances status: Secondary | ICD-10-CM

## 2012-08-08 DIAGNOSIS — Z79899 Other long term (current) drug therapy: Secondary | ICD-10-CM

## 2012-08-08 DIAGNOSIS — Z833 Family history of diabetes mellitus: Secondary | ICD-10-CM

## 2012-08-08 DIAGNOSIS — Z887 Allergy status to serum and vaccine status: Secondary | ICD-10-CM

## 2012-08-08 DIAGNOSIS — J309 Allergic rhinitis, unspecified: Secondary | ICD-10-CM | POA: Diagnosis present

## 2012-08-08 DIAGNOSIS — Z880 Allergy status to penicillin: Secondary | ICD-10-CM

## 2012-08-08 DIAGNOSIS — F411 Generalized anxiety disorder: Secondary | ICD-10-CM | POA: Diagnosis present

## 2012-08-08 DIAGNOSIS — E785 Hyperlipidemia, unspecified: Secondary | ICD-10-CM | POA: Diagnosis present

## 2012-08-08 DIAGNOSIS — D62 Acute posthemorrhagic anemia: Secondary | ICD-10-CM | POA: Diagnosis present

## 2012-08-08 DIAGNOSIS — Z882 Allergy status to sulfonamides status: Secondary | ICD-10-CM

## 2012-08-08 DIAGNOSIS — N6019 Diffuse cystic mastopathy of unspecified breast: Secondary | ICD-10-CM | POA: Diagnosis present

## 2012-08-08 DIAGNOSIS — I10 Essential (primary) hypertension: Secondary | ICD-10-CM

## 2012-08-08 DIAGNOSIS — R109 Unspecified abdominal pain: Secondary | ICD-10-CM

## 2012-08-08 DIAGNOSIS — Y92009 Unspecified place in unspecified non-institutional (private) residence as the place of occurrence of the external cause: Secondary | ICD-10-CM

## 2012-08-08 DIAGNOSIS — W1789XA Other fall from one level to another, initial encounter: Secondary | ICD-10-CM | POA: Diagnosis present

## 2012-08-08 DIAGNOSIS — N390 Urinary tract infection, site not specified: Secondary | ICD-10-CM | POA: Diagnosis present

## 2012-08-08 DIAGNOSIS — S81819A Laceration without foreign body, unspecified lower leg, initial encounter: Secondary | ICD-10-CM

## 2012-08-08 LAB — BASIC METABOLIC PANEL
BUN: 21 mg/dL (ref 6–23)
CO2: 27 mEq/L (ref 19–32)
Calcium: 9.3 mg/dL (ref 8.4–10.5)
Creatinine, Ser: 0.86 mg/dL (ref 0.50–1.10)
Glucose, Bld: 197 mg/dL — ABNORMAL HIGH (ref 70–99)
Sodium: 135 mEq/L (ref 135–145)

## 2012-08-08 LAB — CBC WITH DIFFERENTIAL/PLATELET
Basophils Relative: 0 % (ref 0–1)
Eosinophils Relative: 0 % (ref 0–5)
HCT: 38.6 % (ref 36.0–46.0)
Hemoglobin: 13.2 g/dL (ref 12.0–15.0)
Lymphs Abs: 2.3 10*3/uL (ref 0.7–4.0)
MCH: 31 pg (ref 26.0–34.0)
MCV: 90.6 fL (ref 78.0–100.0)
Monocytes Absolute: 1.3 10*3/uL — ABNORMAL HIGH (ref 0.1–1.0)
Neutro Abs: 22.3 10*3/uL — ABNORMAL HIGH (ref 1.7–7.7)
RBC: 4.26 MIL/uL (ref 3.87–5.11)

## 2012-08-08 MED ORDER — FENTANYL CITRATE 0.05 MG/ML IJ SOLN
50.0000 ug | Freq: Once | INTRAMUSCULAR | Status: AC
  Administered 2012-08-08: 50 ug via INTRAVENOUS
  Filled 2012-08-08: qty 2

## 2012-08-08 MED ORDER — ONDANSETRON HCL 4 MG/2ML IJ SOLN: 4.0000 mg | Freq: Four times a day (QID) | INTRAMUSCULAR | Status: DC | PRN

## 2012-08-08 MED ORDER — HEPARIN SODIUM (PORCINE) 5000 UNIT/ML IJ SOLN
5000.0000 [IU] | Freq: Three times a day (TID) | INTRAMUSCULAR | Status: DC
  Administered 2012-08-09 – 2012-08-10 (×3): 5000 [IU] via SUBCUTANEOUS
  Filled 2012-08-08 (×10): qty 1

## 2012-08-08 MED ORDER — ONDANSETRON HCL 4 MG/2ML IJ SOLN
4.0000 mg | Freq: Once | INTRAMUSCULAR | Status: AC
  Administered 2012-08-08: 4 mg via INTRAVENOUS
  Filled 2012-08-08: qty 2

## 2012-08-08 MED ORDER — IOHEXOL 350 MG/ML SOLN
100.0000 mL | Freq: Once | INTRAVENOUS | Status: AC | PRN
  Administered 2012-08-08: 100 mL via INTRAVENOUS

## 2012-08-08 MED ORDER — FENTANYL CITRATE 0.05 MG/ML IJ SOLN: 50.0000 ug | Freq: Once | INTRAMUSCULAR | Status: DC

## 2012-08-08 MED ORDER — HYDROCHLOROTHIAZIDE 25 MG PO TABS
25.0000 mg | ORAL_TABLET | Freq: Every day | ORAL | Status: DC
  Administered 2012-08-09 – 2012-08-14 (×6): 25 mg via ORAL
  Filled 2012-08-08 (×7): qty 1

## 2012-08-08 MED ORDER — FLUTICASONE PROPIONATE 50 MCG/ACT NA SUSP
2.0000 | Freq: Every day | NASAL | Status: DC
  Administered 2012-08-09 – 2012-08-14 (×6): 2 via NASAL
  Filled 2012-08-08 (×2): qty 16

## 2012-08-08 MED ORDER — LABETALOL HCL 5 MG/ML IV SOLN
10.0000 mg | INTRAVENOUS | Status: DC | PRN
  Administered 2012-08-09: 10 mg via INTRAVENOUS
  Filled 2012-08-08 (×2): qty 4

## 2012-08-08 MED ORDER — POTASSIUM CHLORIDE CRYS ER 20 MEQ PO TBCR
20.0000 meq | EXTENDED_RELEASE_TABLET | Freq: Every day | ORAL | Status: DC
  Filled 2012-08-08: qty 1

## 2012-08-08 MED ORDER — POTASSIUM CHLORIDE IN NACL 20-0.9 MEQ/L-% IV SOLN
INTRAVENOUS | Status: DC
  Administered 2012-08-09 (×3): via INTRAVENOUS
  Filled 2012-08-08 (×7): qty 1000

## 2012-08-08 MED ORDER — FENTANYL CITRATE 0.05 MG/ML IJ SOLN
25.0000 ug | INTRAMUSCULAR | Status: DC | PRN
  Administered 2012-08-09 – 2012-08-10 (×11): 25 ug via INTRAVENOUS
  Filled 2012-08-08 (×13): qty 2

## 2012-08-08 MED ORDER — PANTOPRAZOLE SODIUM 40 MG PO TBEC
40.0000 mg | DELAYED_RELEASE_TABLET | Freq: Every day | ORAL | Status: DC
  Administered 2012-08-09 – 2012-08-14 (×6): 40 mg via ORAL
  Filled 2012-08-08 (×6): qty 1

## 2012-08-08 MED ORDER — SODIUM CHLORIDE 0.9 % IV SOLN
INTRAVENOUS | Status: DC
  Administered 2012-08-08: 19:00:00 via INTRAVENOUS

## 2012-08-08 NOTE — ED Notes (Signed)
Pt is allergic to latex. Waiting on latex free foley to arrive.

## 2012-08-08 NOTE — ED Provider Notes (Signed)
History     CSN: [id number]  Arrival date & time 08/08/12  1736   First MD Initiated Contact with Patient 08/08/12 1818      Chief Complaint  Patient presents with  . Fall    (Consider location/radiation/quality/duration/timing/severity/associated sxs/prior treatment) Patient is a 79 y.o. female presenting with fall. The history is provided by the patient and a relative.  Fall   patient fell through a ceiling at home today prior to arrival. She was walking in her attic and then her foot came through the ceiling and she slowly fell through. According to her son, patient's head flexed forward and she fell onto the floor which is carpeted with her fist striking her abdomen. No loss of consciousness. Complains of severe diffuse abdominal pain. Some nausea without vomiting. Denies any hip or back pain. Some right-sided rib pain without a pleuritic component to it. No numbness or tingling in her arms or legs. Some neck soreness without headache. Family called EMS and patient transported here  Past Medical History  Diagnosis Date  . Hyperlipemia 05/31/1992  . Hypertension 05/31/2000  . Osteopenia 11/29/1999  . Osteoarthritis   . Pedal edema     worsened by Norvasc  . Chronic foot pain     mortons neuroma  . Plantar fasciitis   . Menopausal symptoms     vasomotor symptoms-severe  . Cystocele 01/01    neg. sx  . Hemorrhoids   . fracture fibula ? 2003    left    Past Surgical History  Procedure Laterality Date  . Partial hysterectomy  1970    endometriosis  . Foot problems      neuromas  . Anterior repari w//surg proc  06/30/99    RSO  . History of ct      mass right adnexa, U?S by GYN-observation    Family History  Problem Relation Age of Onset  . Diabetes Mother   . Heart disease Mother     pacemaker  . Hypertension Mother   . COPD Father   . Diabetes Sister   . Diabetes Brother   . Mental illness Son     anxiety-mental health problems  . Diabetes Brother   .  Diabetes Sister   . Diabetes Sister   . Hypertension Other     History  Substance Use Topics  . Smoking status: Never Smoker   . Smokeless tobacco: Not on file  . Alcohol Use: No    OB History   Grav Para Term Preterm Abortions TAB SAB Ect Mult Living                  Review of Systems  All other systems reviewed and are negative.    Allergies  Tdap; Amlodipine besylate; Codeine; Latex; Lovastatin; Morphine and related; Penicillins; Sertraline hcl; Simvastatin; and Sulfonamide derivatives  Home Medications   Current Outpatient Rx  Name  Route  Sig  Dispense  Refill  . fluticasone (FLONASE) 50 MCG/ACT nasal spray      1-2 sprays each nostril daily.         . hydrochlorothiazide (HYDRODIURIL) 25 MG tablet      TAKE 2 TABLETS EVERY DAY   60 tablet   5   . KLOR-CON M10 10 MEQ tablet      TAKE 1 TABLET (10 MEQ TOTAL) BY MOUTH DAILY.   30 tablet   3   . omeprazole (PRILOSEC) 20 MG capsule   Oral   Take 1 capsule (20 mg  total) by mouth 2 (two) times daily.   180 capsule   3   . Calcium Carb-Cholecalciferol (CALCIUM 1000 + D PO)   Oral   Take 1 capsule by mouth daily.          . fexofenadine (ALLEGRA) 180 MG tablet   Oral   Take 180 mg by mouth daily as needed.           . Multiple Vitamins-Minerals (CENTRUM CARDIO PO)   Oral   Take by mouth daily.           . NON FORMULARY      Allergy shots, every other week          . Omega-3 Fatty Acids (FISH OIL) 1200 MG CAPS      Take 2 by mouth daily          . PARoxetine (PAXIL) 10 MG tablet      TAKE 1 TABLET (10 MG TOTAL) BY MOUTH DAILY.   30 tablet   11   . pseudoephedrine-guaifenesin (MUCINEX D) 60-600 MG per tablet   Oral   Take 1 tablet by mouth every 12 (twelve) hours.           BP 178/76  Pulse 68  Resp 24  SpO2 95%  Physical Exam  Nursing note and vitals reviewed. Constitutional: She is oriented to person, place, and time. She appears well-developed and well-nourished.   Non-toxic appearance. No distress.  HENT:  Head: Normocephalic and atraumatic.  Eyes: Conjunctivae, EOM and lids are normal. Pupils are equal, round, and reactive to light.  Neck: Normal range of motion. Neck supple. No tracheal deviation present. No mass present.  Cardiovascular: Normal rate, regular rhythm and normal heart sounds.  Exam reveals no gallop.   No murmur heard. Pulmonary/Chest: Effort normal and breath sounds normal. No stridor. No respiratory distress. She has no decreased breath sounds. She has no wheezes. She has no rhonchi. She has no rales. She exhibits tenderness. She exhibits no crepitus.    Abdominal: Soft. Normal appearance and bowel sounds are normal. She exhibits no distension. There is generalized tenderness. There is no rebound and no CVA tenderness.  Musculoskeletal: Normal range of motion. She exhibits no edema and no tenderness.       Legs: Neurological: She is alert and oriented to person, place, and time. She has normal strength. No cranial nerve deficit or sensory deficit. GCS eye subscore is 4. GCS verbal subscore is 5. GCS motor subscore is 6.  Skin: Skin is warm and dry. No abrasion and no rash noted.  Psychiatric: She has a normal mood and affect. Her speech is normal and behavior is normal.    ED Course  Procedures (including critical care time)  Labs Reviewed  CBC WITH DIFFERENTIAL  BASIC METABOLIC PANEL   No results found.   No diagnosis found.    MDM  Patient given fentanyl x2 for pain. C-spine x-rays are negative. Thoracic spine showed a T8 fracture. Spoke with general surgery here they will see the patient in transfer her to [facility]. Spoke with the neurosurgeon and they will see her in consultation at [facility]        [name], MD 08/08/12 2131

## 2012-08-08 NOTE — ED Notes (Signed)
Pt transferred to CT.

## 2012-08-08 NOTE — H&P (Signed)
Reason for Consult:trauma Referring Physician: Dr. Thea Silversmith [place] Amber Rollins is an 79 y.o. female.  HPI: I was asked to see this patient prior to transfer to [place]  For admission to the trauma service. Shewas entered at earlier today is that when she fell through the roof landing on her head causing flexion injury of of the neck and spine. She did not lose any consciousness and was brought to [facility] long hospital for evaluation. She was stable on arrival and by the time  I have seen her, her C. Collar had been removed. Her only complaint is left-sided abdominal pain.  Past Medical History  Diagnosis Date  . Hyperlipemia 05/31/1992  . Hypertension 05/31/2000  . Osteopenia 11/29/1999  . Osteoarthritis   . Pedal edema     worsened by Norvasc  . Chronic foot pain     mortons neuroma  . Plantar fasciitis   . Menopausal symptoms     vasomotor symptoms-severe  . Cystocele 01/01    neg. sx  . Hemorrhoids   . fracture fibula ? 2003    left    Past Surgical History  Procedure Laterality Date  . Partial hysterectomy  1970    endometriosis  . Foot problems      neuromas  . Anterior repari w//surg proc  06/30/99    RSO  . History of ct      mass right adnexa, U?S by GYN-observation    Family History  Problem Relation Age of Onset  . Diabetes Mother   . Heart disease Mother     pacemaker  . Hypertension Mother   . COPD Father   . Diabetes Sister   . Diabetes Brother   . Mental illness Son     anxiety-mental health problems  . Diabetes Brother   . Diabetes Sister   . Diabetes Sister   . Hypertension Other     Social History:  reports that she has never smoked. She does not have any smokeless tobacco history on file. She reports that she does not drink alcohol or use illicit drugs.  Allergies:  Allergies  Allergen Reactions  . Tdap (Diphth-Acell Pertussis-Tetanus) Swelling  . Amlodipine Besylate     REACTION: edema  . Codeine     headache  . Latex   .  Lovastatin     REACTION: leg pain  . Morphine And Related   . Penicillins     ? What reaction   . Sertraline Hcl     REACTION: caused insomnia  . Simvastatin     REACTION: non tolerant- leg pain  . Sulfonamide Derivatives     Medications: I have reviewed the patient's current medications.  Results for orders placed during the hospital encounter of 08/08/12 (from the past 48 hour(s))  CBC WITH DIFFERENTIAL     Status: Abnormal   Collection Time    08/08/12  6:50 PM      Result Value Range   WBC 25.9 (*) 4.0 - 10.5 K/uL   RBC 4.26  3.87 - 5.11 MIL/uL   Hemoglobin 13.2  12.0 - 15.0 g/dL   HCT 16.1  09.6 - 04.5 %   MCV 90.6  78.0 - 100.0 fL   MCH 31.0  26.0 - 34.0 pg   MCHC 34.2  30.0 - 36.0 g/dL   RDW 40.9  81.1 - 91.4 %   Platelets 238  150 - 400 K/uL   Neutrophils Relative 86 (*) 43 - 77 %   Lymphocytes  Relative 9 (*) 12 - 46 %   Monocytes Relative 5  3 - 12 %   Eosinophils Relative 0  0 - 5 %   Basophils Relative 0  0 - 1 %   Neutro Abs 22.3 (*) 1.7 - 7.7 K/uL   Lymphs Abs 2.3  0.7 - 4.0 K/uL   Monocytes Absolute 1.3 (*) 0.1 - 1.0 K/uL   Eosinophils Absolute 0.0  0.0 - 0.7 K/uL   Basophils Absolute 0.0  0.0 - 0.1 K/uL   WBC Morphology WHITE COUNT CONFIRMED ON SMEAR     Comment: MILD LEFT SHIFT (1-5% METAS, OCC MYELO, OCC BANDS)  BASIC METABOLIC PANEL     Status: Abnormal   Collection Time    08/08/12  6:50 PM      Result Value Range   Sodium 135  135 - 145 mEq/L   Potassium 2.9 (*) 3.5 - 5.1 mEq/L   Chloride 95 (*) 96 - 112 mEq/L   CO2 27  19 - 32 mEq/L   Glucose, Bld 197 (*) 70 - 99 mg/dL   BUN 21  6 - 23 mg/dL   Creatinine, Ser 1.47  0.50 - 1.10 mg/dL   Calcium 9.3  8.4 - 82.9 mg/dL   GFR calc non Af Amer 63 (*) >90 mL/min   GFR calc Af Amer 73 (*) >90 mL/min   Comment:            The eGFR has been calculated     using the CKD EPI equation.     This calculation has not been     validated in all clinical     situations.     eGFR's persistently     <90  mL/min signify     possible Chronic Kidney Disease.    Dg Chest 2 View  08/08/2012  *RADIOLOGY REPORT*  Clinical Data: Fall, chest pain  CHEST - 2 VIEW  Comparison: 10/01/2010  Findings: Lung volumes are low with crowding of the bronchovascular markings.  Cardiac leads obscure detail.  Heart size is normal. Linear right greater than left lower lobe scarring or atelectasis. Heart size is normal. Aorta is ectatic and unfolded.  There is apparent prominence of the superior vascular pedicle.  Interval development of approximately 10-20% wedge compression deformity at a lower thoracic vertebral body is noted.  IMPRESSION: Apparent prominence of the superior vascular pedicle, more than expected the given the degree of hypoaeration and technique.  Given the history of hypertension, this raises the question of aortic aneurysm or other abnormality.  Consider chest CT for further evaluation, with contrast, if symptoms could be referable to this etiology.  Interval development of a lower thoracic vertebral body 10-20% compression fracture deformity.   Original Report Authenticated By: Christiana Pellant, M.D.    Ct Angio Chest W/cm &/or Wo Cm  08/08/2012  *RADIOLOGY REPORT*  Clinical Data:  Fall  CT ANGIOGRAPHY CHEST WITH CONTRAST  Technique:  Multidetector CT imaging of the chest was performed using the standard protocol during bolus administration of intravenous contrast.  Multiplanar CT image reconstructions including MIPs were obtained to evaluate the vascular anatomy.  Contrast: , OMNIPAQUE IOHEXOL 350 MG/ML SOLN  Comparison:   None.  Findings:  There are extensive atherosclerotic changes involving the aortic arch with plaque and calcification.  No evidence of pseudoaneurysm or intimal injury.  No abrupt caliber change.  Mediastinal hemorrhage is present.  There is a small amount of hemorrhage just posterior to the sternum associated with a minimally  displaced fracture through the manubrium.  There is also  hemorrhage in the posterior mediastinum surrounding the distal aorta but primarily associated with the vertebral body fracture at T8.  The fracture extends primarily horizontally through T8.  The fracture line traverses into the right pedicle and transverse process as well as the spinous process.  The left pedicle as preserved.  There is slight retropulsion of the left superior endplate.  The heart is enlarged.  Coronary artery calcifications involves the left main, left anterior descending.  Aortic valve calcifications are noted.  No evidence of pneumothorax. Consolidation is present in the dependent portions of both lower lobes.  Displaced lateral left sixth rib fracture is present.  Small bilateral pleural effusions.  Visualized shoulder regions are intact.  Review of the MIP images confirms the above findings.  IMPRESSION: Manubrial fracture as described.  There is some associated mediastinal hemorrhage.  Significant fracture of T8 extending into the right pedicle and transverse process as well as the spinous process.    This may represent an unstable injury.  Bilateral pleural effusions.  Displaced left sixth rib fracture without pneumothorax.  No evidence of aortic injury.  Extensive atherosclerotic changes are noted.  Bibasilar airspace disease.  CT ABDOMEN AND PELVIS WITH CONTRAST  Technique:  Multidetector CT imaging of the abdomen and pelvis was performed using the standard protocol following bolus administration of intravenous contrast.  Findings:  Solitary small gallstone without wall thickening.  Diffuse hepatic steatosis without evidence of liver injury.  Spleen, adrenal glands are within normal limits.  Pancreas is atrophic without evidence of injury.  Moderate cortical atrophy of the kidneys without evidence of injury to the kidneys.  T12 compression fracture is present with proximally 10% loss of height anteriorly.  The fracture line is primarily horizontal.  5 mm retropulsion of the superior end  plate is present.  Lumbar vertebral bodies are intact.  No free fluid.  No hemoperitoneum.  Cystic lesion in the right hemi pelvis is stable compared with 2008.  Unremarkable bladder.  Moderate stool in the rectosigmoid.  Atherosclerotic changes of the visceral vasculature and aorta are noted.  IMPRESSION: T12 compression fracture as described.  There is some retropulsion of the superior endplate.  Cholelithiasis.  No evidence of organ injury.   Original Report Authenticated By: Jolaine Click, M.D.    Ct Cervical Spine Wo Contrast  08/08/2012  *RADIOLOGY REPORT*  Clinical Data: Larey Seat through attic onto floor prone on neck and face, abdominal pain, nausea  CT CERVICAL SPINE WITHOUT CONTRAST  Technique:  Multidetector CT imaging of the cervical spine was performed. Multiplanar CT image reconstructions were also generated.  Comparison: None  Findings: Visualized skull base intact. Mild osseous demineralization. Minimal biapical lung scarring. Minimal scattered facet degenerative changes. Disc space narrowing with minimal endplate spur formation C4-C5 through C6-C7. Questionable tiny superior endplate compression fracture at anterior T1 versus artifact, seen only on coronal images. No additional fracture, dislocation or bone destruction. Visualized intracranial contents unremarkable. Scattered atherosclerotic calcifications.  IMPRESSION: Questionable tiny superior endplate compression fracture anteriorly at T1 versus artifact as discussed above. Mild scattered degenerative disc and facet disease changes of the cervical spine.  Findings called to Dr. Freida Busman on 08/08/2012 at 2008 hours.   Original Report Authenticated By: Ulyses Southward, M.D.    Ct Abdomen Pelvis W Contrast  08/08/2012  *RADIOLOGY REPORT*  Clinical Data:  Fall  CT ANGIOGRAPHY CHEST WITH CONTRAST  Technique:  Multidetector CT imaging of the chest was performed using the standard protocol  during bolus administration of intravenous contrast.  Multiplanar CT  image reconstructions including MIPs were obtained to evaluate the vascular anatomy.  Contrast: , OMNIPAQUE IOHEXOL 350 MG/ML SOLN  Comparison:   None.  Findings:  There are extensive atherosclerotic changes involving the aortic arch with plaque and calcification.  No evidence of pseudoaneurysm or intimal injury.  No abrupt caliber change.  Mediastinal hemorrhage is present.  There is a small amount of hemorrhage just posterior to the sternum associated with a minimally displaced fracture through the manubrium.  There is also hemorrhage in the posterior mediastinum surrounding the distal aorta but primarily associated with the vertebral body fracture at T8.  The fracture extends primarily horizontally through T8.  The fracture line traverses into the right pedicle and transverse process as well as the spinous process.  The left pedicle as preserved.  There is slight retropulsion of the left superior endplate.  The heart is enlarged.  Coronary artery calcifications involves the left main, left anterior descending.  Aortic valve calcifications are noted.  No evidence of pneumothorax. Consolidation is present in the dependent portions of both lower lobes.  Displaced lateral left sixth rib fracture is present.  Small bilateral pleural effusions.  Visualized shoulder regions are intact.  Review of the MIP images confirms the above findings.  IMPRESSION: Manubrial fracture as described.  There is some associated mediastinal hemorrhage.  Significant fracture of T8 extending into the right pedicle and transverse process as well as the spinous process.    This may represent an unstable injury.  Bilateral pleural effusions.  Displaced left sixth rib fracture without pneumothorax.  No evidence of aortic injury.  Extensive atherosclerotic changes are noted.  Bibasilar airspace disease.  CT ABDOMEN AND PELVIS WITH CONTRAST  Technique:  Multidetector CT imaging of the abdomen and pelvis was performed using the standard  protocol following bolus administration of intravenous contrast.  Findings:  Solitary small gallstone without wall thickening.  Diffuse hepatic steatosis without evidence of liver injury.  Spleen, adrenal glands are within normal limits.  Pancreas is atrophic without evidence of injury.  Moderate cortical atrophy of the kidneys without evidence of injury to the kidneys.  T12 compression fracture is present with proximally 10% loss of height anteriorly.  The fracture line is primarily horizontal.  5 mm retropulsion of the superior end plate is present.  Lumbar vertebral bodies are intact.  No free fluid.  No hemoperitoneum.  Cystic lesion in the right hemi pelvis is stable compared with 2008.  Unremarkable bladder.  Moderate stool in the rectosigmoid.  Atherosclerotic changes of the visceral vasculature and aorta are noted.  IMPRESSION: T12 compression fracture as described.  There is some retropulsion of the superior endplate.  Cholelithiasis.  No evidence of organ injury.   Original Report Authenticated By: Jolaine Click, M.D.     All other review of systems negative or noncontributory except as stated in the HPI   Blood pressure 160/63, pulse 68, resp. rate 20, SpO2 96.00%. General appearance: alert, cooperative and no distress Head: Normocephalic, without obvious abnormality, atraumatic Eyes: negative, conjunctivae/corneas clear. PERRL, EOM's intact. Fundi benign. Ears: normal TM's and external ear canals both ears Nose: Nares normal. Septum midline. Mucosa normal. No drainage or sinus tenderness. Throat: lips, mucosa, and tongue normal; teeth and gums normal Neck: no JVD, supple, symmetrical, trachea midline and no boney tenderness, c collar off Back: some scoliosis, nontender to exam, no step offs, no obvious fx on exam Resp: clear to auscultation bilaterally Chest wall: no  tenderness, some bruising at the sternum Cardio: normal rate, regular GI: soft, mild left sided tenderness, ND, no  peritoneal signs Pelvic: pelvis is stable Extremities: small 3cm V shaped laceration on medial left ankle region, she has some bruising around her knees but nontender, no evidence of any fractures Pulses: 2+ and symmetric Skin: Skin color, texture, turgor normal. No rashes or lesions or bruising and lacerations as above Neurologic: Grossly normal, no evidence of any neurologic compromise  Assessment/Plan: Polytrauma including manubrial fracture, T8 fracture, left sixth rib fracture, T12 compression fracture, abdominal pain without any obvious intra-abdominal injury, skin laceration She has injuries as described above. Most of these are spinal fractures and Dr. Phoebe Perch is aware the patient per his discussion with Dr. Freida Busman the ER provider.  He has agreed to consult on the patient upon arrival to [facility] hospital. We will plan on admission to to the trauma service for pain control and blood pressure control and for repeat abdominal exams to rule out intra-abdominal injury. Her disposition will likely depend upon neurosurgery recommendations and this will likely be her limiting factor. Dr. Luisa Hart will accept the patient to the trauma service at [facility] in transfer after her laceration is repaired.   Lodema Pilot DAVID 08/08/2012, 10:33 PM

## 2012-08-08 NOTE — ED Notes (Signed)
[id number]<GN> Expected date:<BR> Expected time:<BR> Means of arrival:<BR> Comments:<BR> 79 y/o F, fall through roof

## 2012-08-08 NOTE — ED Notes (Signed)
Pt fell though sheet rock of attic onto floor. Pt fell on neck and face, prone.  Pt complaining of abd pain and nausea.  Pt has 1" bruise to sternum.  EMS reports o2 sat 93% RA.   Lungs clear, pedal and radial pulse strong.  Sensation intact, pt moves all extremities.  Pt has R hip bruising.  Lower back pain. Denies neck pain, LOC.  L ankle laceration, bleeding control, upper lip bleeding.

## 2012-08-09 ENCOUNTER — Encounter (HOSPITAL_COMMUNITY): Payer: Self-pay | Admitting: Orthopedic Surgery

## 2012-08-09 DIAGNOSIS — W19XXXA Unspecified fall, initial encounter: Secondary | ICD-10-CM

## 2012-08-09 DIAGNOSIS — S2232XA Fracture of one rib, left side, initial encounter for closed fracture: Secondary | ICD-10-CM

## 2012-08-09 DIAGNOSIS — S81819A Laceration without foreign body, unspecified lower leg, initial encounter: Secondary | ICD-10-CM

## 2012-08-09 DIAGNOSIS — S2220XA Unspecified fracture of sternum, initial encounter for closed fracture: Secondary | ICD-10-CM

## 2012-08-09 DIAGNOSIS — S22080A Wedge compression fracture of T11-T12 vertebra, initial encounter for closed fracture: Secondary | ICD-10-CM

## 2012-08-09 DIAGNOSIS — S22069A Unspecified fracture of T7-T8 vertebra, initial encounter for closed fracture: Secondary | ICD-10-CM

## 2012-08-09 MED ORDER — FENTANYL CITRATE 0.05 MG/ML IJ SOLN: 25.0000 ug | INTRAMUSCULAR | Status: DC | PRN

## 2012-08-09 MED ORDER — WHITE PETROLATUM GEL
Status: AC
  Filled 2012-08-09: qty 5

## 2012-08-09 MED ORDER — NALOXONE HCL 0.4 MG/ML IJ SOLN: 0.4000 mg | INTRAMUSCULAR | Status: DC | PRN

## 2012-08-09 MED ORDER — DIPHENHYDRAMINE HCL 12.5 MG/5ML PO ELIX: 12.5000 mg | ORAL_SOLUTION | Freq: Four times a day (QID) | ORAL | Status: DC | PRN

## 2012-08-09 MED ORDER — METHOCARBAMOL 100 MG/ML IJ SOLN
500.0000 mg | Freq: Four times a day (QID) | INTRAMUSCULAR | Status: DC | PRN
  Administered 2012-08-09 – 2012-08-13 (×5): 500 mg via INTRAVENOUS
  Filled 2012-08-09 (×5): qty 5

## 2012-08-09 MED ORDER — POTASSIUM CHLORIDE 20 MEQ/15ML (10%) PO LIQD
20.0000 meq | Freq: Every day | ORAL | Status: DC
  Administered 2012-08-09 – 2012-08-14 (×6): 20 meq via ORAL
  Filled 2012-08-09 (×6): qty 15

## 2012-08-09 MED ORDER — DIPHENHYDRAMINE HCL 50 MG/ML IJ SOLN: 12.5000 mg | Freq: Four times a day (QID) | INTRAMUSCULAR | Status: DC | PRN

## 2012-08-09 MED ORDER — ONDANSETRON HCL 4 MG/2ML IJ SOLN: 4.0000 mg | Freq: Four times a day (QID) | INTRAMUSCULAR | Status: DC | PRN

## 2012-08-09 MED ORDER — FENTANYL CITRATE 0.05 MG/ML IJ SOLN
25.0000 ug | Freq: Once | INTRAMUSCULAR | Status: AC
  Administered 2012-08-09: 25 ug via INTRAVENOUS

## 2012-08-09 MED ORDER — FENTANYL 10 MCG/ML IV SOLN
INTRAVENOUS | Status: DC
  Administered 2012-08-09: 16:00:00 via INTRAVENOUS
  Filled 2012-08-09: qty 50

## 2012-08-09 MED ORDER — SODIUM CHLORIDE 0.9 % IJ SOLN: 9.0000 mL | INTRAMUSCULAR | Status: DC | PRN

## 2012-08-09 NOTE — Progress Notes (Signed)
Patient 02 sats were staying between 87-89% while using Fentanyl PCA causing it to alarm constantly. Patient could no longer tolerate alarms so Dr. Gwinda Passe was notified. Orders were given to discontinue PCA and re order Fentanyl q1 hr prn as needed for pain. Nsg will continue to monitor.

## 2012-08-09 NOTE — ED Provider Notes (Signed)
LACERATION REPAIR Performed by: Raymon Mutton Authorized by: Raymon Mutton Consent: Verbal consent obtained. Risks and benefits: risks, benefits and alternatives were discussed Consent given by: patient Patient identity confirmed: provided demographic data Prepped and Draped in normal sterile fashion Wound explored  Laceration Location: left lower leg, just above left ankle   Laceration Length: 1.5-2cm  No Foreign Bodies seen or palpated  Anesthesia: local infiltration  Local anesthetic: lidocaine 2% without epinephrine  Anesthetic total: 3 ml  Irrigation method: syringe Amount of cleaning: standard  Skin closure: 3-0 Prolene  Number of sutures: 4 superficial sutures using 3-0 Prolene  Technique: single interrupted   Patient tolerance: Patient tolerated the procedure well with no immediate complications.  Patient identified verbally and verifying armband. Discussed with patient and relatives the procedure and all parties agreed, verbal consent. Clean wound with irrigation. Wound was approximately 1.5-2cm. Placed four superficial sutures using 3-0 Prolene to lower portion of left leg, just above left ankle. Sterile technique performed, performed with Latex-free gloves. Placed bacitracin ointment and steristrips onto site. Covered with guaze.   Raymon Mutton, PA-C 08/09/12 0020

## 2012-08-09 NOTE — Progress Notes (Signed)
Orthopedic Tech Progress Note Patient Details:  Amber Rollins October 29, 1930 [id number]  Patient ID: Amber Rollins, female   DOB: Dec 06, 1930, 79 y.o.   MRN: [id number]   Amber Rollins 08/09/2012, 8:43 AM CALLED [employer] FOR TLSO BRACE.

## 2012-08-09 NOTE — Progress Notes (Signed)
Orthopedic Tech Progress Note Patient Details:  Amber Rollins January 31, 1931 [id number]  Patient ID: [name], female   DOB: [birth year], 79 y.o.   MRN: [id number]   [name] 08/09/2012, 2:28 PM  TLSO COMPLETED BY [employer].

## 2012-08-09 NOTE — Progress Notes (Signed)
Patient ID: Amber Rollins, female   DOB: 04/24/1931, 79 y.o.   MRN: [id number]  LOS: 1 day   Subjective: Patient complaining of lots of abdominal pain.  No peritonitis on examination.  Says it feels like spasm  Objective: Vital signs in last 24 hours: Temp:  [98.4 F (36.9 C)-98.7 F (37.1 C)] 98.4 F (36.9 C) (03/12 0949) Pulse Rate:  [68-90] 80 (03/12 0949) Resp:  [18-24] 18 (03/12 0700) BP: (121-191)/(50-102) 121/50 mmHg (03/12 0949) SpO2:  [90 %-100 %] 100 % (03/12 0949) Weight:  [148 lb 8 oz (67.359 kg)] 148 lb 8 oz (67.359 kg) (03/12 0555) Last BM Date: 08/08/12   Physical Exam General appearance: alert, cooperative, appears stated age and moderate distress Resp: clear to auscultation bilaterally and shallow respirations Chest wall: left sided chest wall tenderness, No crepitus GI: abnormal findings:  guarding and normal bowel sounds, no peritonitis.  Soft, most of her tenderness appears to be in the wall. Extremities: extremities normal, atraumatic, no cyanosis or edema and Homans sign is negative, no sign of DVT Neurologic: Alert and oriented X 3, normal strength and tone. Normal symmetric reflexes. Normal coordination and gait   Assessment/Plan: Fall Sternal fx Left rib fx T8/T12 fxs -- TLSO LLE laceration -- Local care Multiple medical problems -- Home meds  Patient may need some anti-spasmodic for her abdominal pain.  Still too sore to get OOB with brace.  Does not need brace while in bed. Pain control    08/09/2012

## 2012-08-09 NOTE — Clinical Social Work Note (Signed)
Clinical Social Work Department BRIEF PSYCHOSOCIAL ASSESSMENT 08/09/2012  Patient:  Amber Rollins, Amber Rollins     Account Number:  [id number]     Admit date:  08/08/2012  Clinical Social Worker:  Verl Blalock  Date/Time:  08/09/2012 02:30 PM  Referred by:  Physician  Date Referred:  08/09/2012 Referred for  Psychosocial assessment   Other Referral:   Interview type:  Patient Other interview type:   Patient daughter and niece at bedside    PSYCHOSOCIAL DATA Living Status:  HUSBAND Admitted from facility:   Level of care:   Primary support name:  Amber Rollins   [phone] Primary support relationship to patient:  CHILD, ADULT Degree of support available:   Strong    CURRENT CONCERNS Current Concerns  Post-Acute Placement   Other Concerns:   Patient husband needs    SOCIAL WORK ASSESSMENT / PLAN Clinical Social Worker met with patient and patient family at bedside to offer support and discuss possible discharge needs.  Patient must lay flat in bed and slowly progress to getting up with TLSO brace.  Patient is in a great deal of pain with limited engagement in conversation.  Patient states that she is the primary caregiver for her husband who has cancer.  Patient daughter states that there is plenty of family in the area to assist with patient husband while patient is hospitalized.  Patient unable to participate with therapies yet - CSW to discuss possible rehab needs once patient is able to work with therapies. CSW remains available for emotional support and to facilitate appropriate discharge needs once patient is medically stable.   Assessment/plan status:  Psychosocial Support/Ongoing Assessment of Needs Other assessment/ plan:   Information/referral to community resources:   Patient is in a great deal of pain but aware of social work role and will request resources as needed.  Patient daughter provided with CSW contact information in the event of questions.     PATIENT'S/FAMILY'S RESPONSE TO PLAN OF CARE: Patient alert and oriented x3 laying flat in bed.  Patient states that she is in so much pain and the current medication regimin is not working - CSW notified PA who will follow up with patient.  Patient hopeful to return home but due to pain and the inability to work with therapies we will determine discharge plans at a later time.  Patient with good family support and assistance as needed.    Macario Golds, [place] [phone]

## 2012-08-09 NOTE — ED Notes (Signed)
Pt log rolled, bed linens changed, neuro remains intact

## 2012-08-09 NOTE — Progress Notes (Addendum)
Spoke with patient, pt's sister at bedside, daughter had just left room. Pt was living at home taking care of her spouse who is immobile.  She has been his primary caregiver during his cancer treatments.  Await end of bedrest, TLSO brace and PT/OT to determine what needs pt will have at d/c.    UR of chart completed.

## 2012-08-09 NOTE — Consult Note (Signed)
Reason for Consult:T8 and T12 fx Referring Physician:Anthony Deanna Artis, MD   Amber Rollins is an 79 y.o. female.  HPI: patient fell through a ceiling at home today prior to arrival. She was walking in her attic and then her foot came through the ceiling and she slowly fell through. According to her son, patient's head flexed forward and she fell onto the floor which is carpeted with her first striking her abdomen. No loss of consciousness. Complained of severe diffuse abdominal pain on arrival to ER.  Pt found to have T8 and T12 fx  - was transferred to [facility] and admitted to trauma service and we were consulted.  Pt with mild abdominal pain now  - no numbness, tingling, weakness   PMH:  Past Medical History  Diagnosis Date  . Hyperlipemia 05/31/1992  . Hypertension 05/31/2000  . Osteopenia 11/29/1999  . Osteoarthritis   . Pedal edema     worsened by Norvasc  . Chronic foot pain     mortons neuroma  . Plantar fasciitis   . Menopausal symptoms     vasomotor symptoms-severe  . Cystocele 01/01    neg. sx  . Hemorrhoids   . fracture fibula ? 2003    left    Past Surgical History  Procedure Laterality Date  . Partial hysterectomy  1970    endometriosis  . Foot problems      neuromas  . Anterior repari w//surg proc  06/30/99    RSO  . History of ct      mass right adnexa, U?S by GYN-observation    Family History:  Family History  Problem Relation Age of Onset  . Diabetes Mother   . Heart disease Mother     pacemaker  . Hypertension Mother   . COPD Father   . Diabetes Sister   . Diabetes Brother   . Mental illness Son     anxiety-mental health problems  . Diabetes Brother   . Diabetes Sister   . Diabetes Sister   . Hypertension Other     Social History:  reports that she has never smoked. She does not have any smokeless tobacco history on file. She reports that she does not drink alcohol or use illicit drugs.  Allergies:  Allergies  Allergen Reactions  .  Tdap (Diphth-Acell Pertussis-Tetanus) Swelling  . Amlodipine Besylate     REACTION: edema  . Codeine     headache  . Latex   . Lovastatin     REACTION: leg pain  . Morphine And Related   . Penicillins     ? What reaction   . Sertraline Hcl     REACTION: caused insomnia  . Simvastatin     REACTION: non tolerant- leg pain  . Sulfonamide Derivatives     Medications:  Prior to Admission medications   Medication Sig Start Date End Date Taking? Authorizing Provider  Calcium Carb-Cholecalciferol (CALCIUM 1000 + D PO) Take 1 capsule by mouth daily.    Yes Historical Provider, MD  fexofenadine (ALLEGRA) 180 MG tablet Take 180 mg by mouth daily as needed.     Yes Historical Provider, MD  fluticasone (FLONASE) 50 MCG/ACT nasal spray 1-2 sprays each nostril daily.   Yes Historical Provider, MD  hydrochlorothiazide (HYDRODIURIL) 25 MG tablet TAKE 2 TABLETS EVERY DAY 06/12/12  Yes Marne A Tower, MD  KLOR-CON M10 10 MEQ tablet TAKE 1 TABLET (10 MEQ TOTAL) BY MOUTH DAILY. 05/12/12  Yes [name], MD  Multiple  Vitamins-Minerals (CENTRUM CARDIO PO) Take by mouth daily.     Yes Historical Provider, MD  NON FORMULARY Allergy shots, every other week    Yes Historical Provider, MD  Omega-3 Fatty Acids (FISH OIL) 1200 MG CAPS Take 2 by mouth daily    Yes Historical Provider, MD  omeprazole (PRILOSEC) 20 MG capsule Take 1 capsule (20 mg total) by mouth 2 (two) times daily. 03/08/12  Yes Marne A Tower, MD  PARoxetine (PAXIL) 10 MG tablet TAKE 1 TABLET (10 MG TOTAL) BY MOUTH DAILY. 07/07/12  Yes [name], MD    Results for orders placed during the hospital encounter of 08/08/12 (from the past 48 hour(s))  CBC WITH DIFFERENTIAL     Status: Abnormal   Collection Time    08/08/12  6:50 PM      Result Value Range   WBC 25.9 (*) 4.0 - 10.5 K/uL   RBC 4.26  3.87 - 5.11 MIL/uL   Hemoglobin 13.2  12.0 - 15.0 g/dL   HCT 16.1  09.6 - 04.5 %   MCV 90.6  78.0 - 100.0 fL   MCH 31.0  26.0 - 34.0 pg   MCHC  34.2  30.0 - 36.0 g/dL   RDW 40.9  81.1 - 91.4 %   Platelets 238  150 - 400 K/uL   Neutrophils Relative 86 (*) 43 - 77 %   Lymphocytes Relative 9 (*) 12 - 46 %   Monocytes Relative 5  3 - 12 %   Eosinophils Relative 0  0 - 5 %   Basophils Relative 0  0 - 1 %   Neutro Abs 22.3 (*) 1.7 - 7.7 K/uL   Lymphs Abs 2.3  0.7 - 4.0 K/uL   Monocytes Absolute 1.3 (*) 0.1 - 1.0 K/uL   Eosinophils Absolute 0.0  0.0 - 0.7 K/uL   Basophils Absolute 0.0  0.0 - 0.1 K/uL   WBC Morphology WHITE COUNT CONFIRMED ON SMEAR     Comment: MILD LEFT SHIFT (1-5% METAS, OCC MYELO, OCC BANDS)  BASIC METABOLIC PANEL     Status: Abnormal   Collection Time    08/08/12  6:50 PM      Result Value Range   Sodium 135  135 - 145 mEq/L   Potassium 2.9 (*) 3.5 - 5.1 mEq/L   Chloride 95 (*) 96 - 112 mEq/L   CO2 27  19 - 32 mEq/L   Glucose, Bld 197 (*) 70 - 99 mg/dL   BUN 21  6 - 23 mg/dL   Creatinine, Ser 7.82  0.50 - 1.10 mg/dL   Calcium 9.3  8.4 - 95.6 mg/dL   GFR calc non Af Amer 63 (*) >90 mL/min   GFR calc Af Amer 73 (*) >90 mL/min   Comment:            The eGFR has been calculated     using the CKD EPI equation.     This calculation has not been     validated in all clinical     situations.     eGFR's persistently     <90 mL/min signify     possible Chronic Kidney Disease.    Dg Chest 2 View  08/08/2012  *RADIOLOGY REPORT*  Clinical Data: Fall, chest pain  CHEST - 2 VIEW  Comparison: 10/01/2010  Findings: Lung volumes are low with crowding of the bronchovascular markings.  Cardiac leads obscure detail.  Heart size is normal. Linear right greater than left lower  lobe scarring or atelectasis. Heart size is normal. Aorta is ectatic and unfolded.  There is apparent prominence of the superior vascular pedicle.  Interval development of approximately 10-20% wedge compression deformity at a lower thoracic vertebral body is noted.  IMPRESSION: Apparent prominence of the superior vascular pedicle, more than expected the  given the degree of hypoaeration and technique.  Given the history of hypertension, this raises the question of aortic aneurysm or other abnormality.  Consider chest CT for further evaluation, with contrast, if symptoms could be referable to this etiology.  Interval development of a lower thoracic vertebral body 10-20% compression fracture deformity.   Original Report Authenticated By: Christiana Pellant, M.D.    Ct Angio Chest W/cm &/or Wo Cm  08/08/2012  *RADIOLOGY REPORT*  Clinical Data:  Fall  CT ANGIOGRAPHY CHEST WITH CONTRAST  Technique:  Multidetector CT imaging of the chest was performed using the standard protocol during bolus administration of intravenous contrast.  Multiplanar CT image reconstructions including MIPs were obtained to evaluate the vascular anatomy.  Contrast: , OMNIPAQUE IOHEXOL 350 MG/ML SOLN  Comparison:   None.  Findings:  There are extensive atherosclerotic changes involving the aortic arch with plaque and calcification.  No evidence of pseudoaneurysm or intimal injury.  No abrupt caliber change.  Mediastinal hemorrhage is present.  There is a small amount of hemorrhage just posterior to the sternum associated with a minimally displaced fracture through the manubrium.  There is also hemorrhage in the posterior mediastinum surrounding the distal aorta but primarily associated with the vertebral body fracture at T8.  The fracture extends primarily horizontally through T8.  The fracture line traverses into the right pedicle and transverse process as well as the spinous process.  The left pedicle as preserved.  There is slight retropulsion of the left superior endplate.  The heart is enlarged.  Coronary artery calcifications involves the left main, left anterior descending.  Aortic valve calcifications are noted.  No evidence of pneumothorax. Consolidation is present in the dependent portions of both lower lobes.  Displaced lateral left sixth rib fracture is present.  Small bilateral  pleural effusions.  Visualized shoulder regions are intact.  Review of the MIP images confirms the above findings.  IMPRESSION: Manubrial fracture as described.  There is some associated mediastinal hemorrhage.  Significant fracture of T8 extending into the right pedicle and transverse process as well as the spinous process.    This may represent an unstable injury.  Bilateral pleural effusions.  Displaced left sixth rib fracture without pneumothorax.  No evidence of aortic injury.  Extensive atherosclerotic changes are noted.  Bibasilar airspace disease.  CT ABDOMEN AND PELVIS WITH CONTRAST  Technique:  Multidetector CT imaging of the abdomen and pelvis was performed using the standard protocol following bolus administration of intravenous contrast.  Findings:  Solitary small gallstone without wall thickening.  Diffuse hepatic steatosis without evidence of liver injury.  Spleen, adrenal glands are within normal limits.  Pancreas is atrophic without evidence of injury.  Moderate cortical atrophy of the kidneys without evidence of injury to the kidneys.  T12 compression fracture is present with proximally 10% loss of height anteriorly.  The fracture line is primarily horizontal.  5 mm retropulsion of the superior end plate is present.  Lumbar vertebral bodies are intact.  No free fluid.  No hemoperitoneum.  Cystic lesion in the right hemi pelvis is stable compared with 2008.  Unremarkable bladder.  Moderate stool in the rectosigmoid.  Atherosclerotic changes of the visceral vasculature  and aorta are noted.  IMPRESSION: T12 compression fracture as described.  There is some retropulsion of the superior endplate.  Cholelithiasis.  No evidence of organ injury.   Original Report Authenticated By: Jolaine Click, M.D.    Ct Cervical Spine Wo Contrast  08/08/2012  *RADIOLOGY REPORT*  Clinical Data: Larey Seat through attic onto floor prone on neck and face, abdominal pain, nausea  CT CERVICAL SPINE WITHOUT CONTRAST  Technique:   Multidetector CT imaging of the cervical spine was performed. Multiplanar CT image reconstructions were also generated.  Comparison: None  Findings: Visualized skull base intact. Mild osseous demineralization. Minimal biapical lung scarring. Minimal scattered facet degenerative changes. Disc space narrowing with minimal endplate spur formation C4-C5 through C6-C7. Questionable tiny superior endplate compression fracture at anterior T1 versus artifact, seen only on coronal images. No additional fracture, dislocation or bone destruction. Visualized intracranial contents unremarkable. Scattered atherosclerotic calcifications.  IMPRESSION: Questionable tiny superior endplate compression fracture anteriorly at T1 versus artifact as discussed above. Mild scattered degenerative disc and facet disease changes of the cervical spine.  Findings called to Dr. Freida Busman on 08/08/2012 at 2008 hours.   Original Report Authenticated By: Ulyses Southward, M.D.    Ct Abdomen Pelvis W Contrast  08/08/2012  *RADIOLOGY REPORT*  Clinical Data:  Fall  CT ANGIOGRAPHY CHEST WITH CONTRAST  Technique:  Multidetector CT imaging of the chest was performed using the standard protocol during bolus administration of intravenous contrast.  Multiplanar CT image reconstructions including MIPs were obtained to evaluate the vascular anatomy.  Contrast: , OMNIPAQUE IOHEXOL 350 MG/ML SOLN  Comparison:   None.  Findings:  There are extensive atherosclerotic changes involving the aortic arch with plaque and calcification.  No evidence of pseudoaneurysm or intimal injury.  No abrupt caliber change.  Mediastinal hemorrhage is present.  There is a small amount of hemorrhage just posterior to the sternum associated with a minimally displaced fracture through the manubrium.  There is also hemorrhage in the posterior mediastinum surrounding the distal aorta but primarily associated with the vertebral body fracture at T8.  The fracture extends primarily  horizontally through T8.  The fracture line traverses into the right pedicle and transverse process as well as the spinous process.  The left pedicle as preserved.  There is slight retropulsion of the left superior endplate.  The heart is enlarged.  Coronary artery calcifications involves the left main, left anterior descending.  Aortic valve calcifications are noted.  No evidence of pneumothorax. Consolidation is present in the dependent portions of both lower lobes.  Displaced lateral left sixth rib fracture is present.  Small bilateral pleural effusions.  Visualized shoulder regions are intact.  Review of the MIP images confirms the above findings.  IMPRESSION: Manubrial fracture as described.  There is some associated mediastinal hemorrhage.  Significant fracture of T8 extending into the right pedicle and transverse process as well as the spinous process.    This may represent an unstable injury.  Bilateral pleural effusions.  Displaced left sixth rib fracture without pneumothorax.  No evidence of aortic injury.  Extensive atherosclerotic changes are noted.  Bibasilar airspace disease.  CT ABDOMEN AND PELVIS WITH CONTRAST  Technique:  Multidetector CT imaging of the abdomen and pelvis was performed using the standard protocol following bolus administration of intravenous contrast.  Findings:  Solitary small gallstone without wall thickening.  Diffuse hepatic steatosis without evidence of liver injury.  Spleen, adrenal glands are within normal limits.  Pancreas is atrophic without evidence of injury.  Moderate cortical  atrophy of the kidneys without evidence of injury to the kidneys.  T12 compression fracture is present with proximally 10% loss of height anteriorly.  The fracture line is primarily horizontal.  5 mm retropulsion of the superior end plate is present.  Lumbar vertebral bodies are intact.  No free fluid.  No hemoperitoneum.  Cystic lesion in the right hemi pelvis is stable compared with 2008.   Unremarkable bladder.  Moderate stool in the rectosigmoid.  Atherosclerotic changes of the visceral vasculature and aorta are noted.  IMPRESSION: T12 compression fracture as described.  There is some retropulsion of the superior endplate.  Cholelithiasis.  No evidence of organ injury.   Original Report Authenticated By: Jolaine Click, M.D.     ROS o/w -  PE Blood pressure 150/102, pulse 90, temperature 98.7 F (37.1 C), resp. rate 18, height 5' 3.5" (1.613 m), weight 67.359 kg (148 lb 8 oz), SpO2 98.00%. AAOx3 - CN II-XII intact -  Neck supple NT ,  T/Lspine NT ,   Motor - 5/5 all motor groups, sensation  Intact - no clonus , DTR1/4 all , no babinski  Assessment/Plan: T8 burst Fx and T12 compression Fx -  Neuro intact -  Treat with flat bedrest  ,  Once pain subsides , will need vertalign TLSO brace  - and then can star tincreasing HOB and activity with brace on.   Will follow  Clydene Fake, MD 08/09/2012, 7:40 AM

## 2012-08-10 ENCOUNTER — Inpatient Hospital Stay (HOSPITAL_COMMUNITY): Payer: Medicare Other

## 2012-08-10 DIAGNOSIS — D62 Acute posthemorrhagic anemia: Secondary | ICD-10-CM | POA: Diagnosis not present

## 2012-08-10 DIAGNOSIS — E876 Hypokalemia: Secondary | ICD-10-CM

## 2012-08-10 LAB — BASIC METABOLIC PANEL
CO2: 30 mEq/L (ref 19–32)
GFR calc non Af Amer: 81 mL/min — ABNORMAL LOW (ref >90)
Glucose, Bld: 115 mg/dL — ABNORMAL HIGH (ref 70–99)
Potassium: 3.5 mEq/L (ref 3.5–5.1)
Sodium: 137 mEq/L (ref 135–145)

## 2012-08-10 LAB — CBC
Hemoglobin: 11 g/dL — ABNORMAL LOW (ref 12.0–15.0)
RBC: 3.56 MIL/uL — ABNORMAL LOW (ref 3.87–5.11)
WBC: 11.8 10*3/uL — ABNORMAL HIGH (ref 4.0–10.5)

## 2012-08-10 MED ORDER — HYDROCODONE-ACETAMINOPHEN 10-325 MG PO TABS
0.5000 | ORAL_TABLET | ORAL | Status: DC | PRN
  Administered 2012-08-10 (×3): 1 via ORAL
  Administered 2012-08-11: 2 via ORAL
  Administered 2012-08-11 – 2012-08-12 (×5): 1 via ORAL
  Filled 2012-08-10 (×3): qty 1
  Filled 2012-08-10: qty 2
  Filled 2012-08-10 (×5): qty 1

## 2012-08-10 MED ORDER — ENOXAPARIN SODIUM 40 MG/0.4ML ~~LOC~~ SOLN
40.0000 mg | Freq: Every day | SUBCUTANEOUS | Status: DC
  Administered 2012-08-10 – 2012-08-14 (×5): 40 mg via SUBCUTANEOUS
  Filled 2012-08-10 (×5): qty 0.4

## 2012-08-10 MED ORDER — BACITRACIN ZINC 500 UNIT/GM EX OINT
TOPICAL_OINTMENT | Freq: Two times a day (BID) | CUTANEOUS | Status: DC
  Administered 2012-08-10 – 2012-08-14 (×8): via TOPICAL
  Filled 2012-08-10: qty 15

## 2012-08-10 MED ORDER — LORATADINE 10 MG PO TABS
10.0000 mg | ORAL_TABLET | Freq: Every day | ORAL | Status: DC
  Administered 2012-08-10 – 2012-08-14 (×5): 10 mg via ORAL
  Filled 2012-08-10 (×5): qty 1

## 2012-08-10 MED ORDER — ONDANSETRON HCL 4 MG/2ML IJ SOLN
4.0000 mg | INTRAMUSCULAR | Status: DC | PRN
  Administered 2012-08-10 – 2012-08-11 (×2): 4 mg via INTRAVENOUS
  Filled 2012-08-10 (×2): qty 2

## 2012-08-10 MED ORDER — PAROXETINE HCL 10 MG PO TABS
10.0000 mg | ORAL_TABLET | Freq: Every day | ORAL | Status: DC
  Administered 2012-08-10 – 2012-08-14 (×5): 10 mg via ORAL
  Filled 2012-08-10 (×5): qty 1

## 2012-08-10 MED ORDER — TRAMADOL HCL 50 MG PO TABS
100.0000 mg | ORAL_TABLET | Freq: Four times a day (QID) | ORAL | Status: DC
  Administered 2012-08-10 – 2012-08-14 (×15): 100 mg via ORAL
  Filled 2012-08-10 (×16): qty 2

## 2012-08-10 MED ORDER — NAPROXEN 500 MG PO TABS
500.0000 mg | ORAL_TABLET | Freq: Two times a day (BID) | ORAL | Status: DC
  Administered 2012-08-10 – 2012-08-14 (×8): 500 mg via ORAL
  Filled 2012-08-10 (×11): qty 1

## 2012-08-10 NOTE — Progress Notes (Signed)
Patient ID: Amber Rollins, female   DOB: 02-Sep-1930, 79 y.o.   MRN: [id number]   LOS: 2 days   Subjective: Noted events of last night, too much respiratory depression with fentanyl PCA. Pain is tolerable this morning as long as she's still. Denies N/V.  Objective: Vital signs in last 24 hours: Temp:  [97.7 F (36.5 C)-98.6 F (37 C)] 98.6 F (37 C) (03/12 2317) Pulse Rate:  [74-78] 74 (03/12 2317) Resp:  [18-20] 18 (03/12 2317) BP: (121-124)/(55-56) 124/56 mmHg (03/12 2317) SpO2:  [90 %-97 %] 90 % (03/12 2317) Last BM Date: 08/08/12   Physical Exam General appearance: alert and no distress Resp: clear to auscultation bilaterally, suspect SQE on left Cardio: regular rate and rhythm GI: normal findings: bowel sounds normal and soft, non-tender Extremities: NVI   Assessment/Plan: Fall Sternal fx -- Pain control Left rib fx -- Needs IS, check CXR with SQE T8/T12 fxs -- TLSO Leg lac -- Local care Multiple medical problems -- Home meds FEN -- Will try to get pain controlled with oral medications, try tramadol + NSAID scheduled with narc prn given age and respiratory depression. Continue foley for now until pain with log-rolling improved. Recheck BMET for renal fxn, hypokalemia. Recheck CBC for anemia. VTE -- SCD's, Lovenox Dispo -- Will order PT/OT   Freeman Caldron, PA-C Pager: [phone] General Trauma PA Pager: [phone]   08/10/2012

## 2012-08-10 NOTE — Progress Notes (Signed)
PT Cancellation Note  Patient Details Name: Amber Rollins MRN: [id number] DOB: 1931/03/14   Cancelled Treatment:    Reason Eval/Treat Not Completed: Pain limiting ability to participate (pt states pain 10/10 and that Dr.Hirsch instructed her not to get up today but wait til tomorrow. Pt aware of need to have TLSO on in supine prior to progressing mobility and denied attempting rolling to don. Will attempt next date ) Trauma has cleared mobility. Neurosurgery please advise if further restrictions against mobility with brace on.   Delaney Meigs, PT [phone]

## 2012-08-10 NOTE — Progress Notes (Signed)
Mild nausea, no flatus.  She is at risk for ileus with spine FXs. Pain seems better controlled.  Therapies P. Patient examined and I agree with the assessment and plan  Violeta Gelinas, MD, MPH, FACS Pager: [phone]  08/10/2012 1:01 PM

## 2012-08-10 NOTE — Progress Notes (Signed)
C/o abdominal pain with movement  Temp:  [98.4 F (36.9 C)-98.6 F (37 C)] 98.4 F (36.9 C) (03/13 1400) Pulse Rate:  [74-88] 88 (03/13 1400) Resp:  [16-18] 16 (03/13 1400) BP: (124-148)/(56-63) 148/63 mmHg (03/13 1400) SpO2:  [90 %-91 %] 91 % (03/13 1400) Good strength and sensation  Plan: CPM

## 2012-08-11 NOTE — Evaluation (Signed)
Occupational Therapy Evaluation Patient Details Name: Amber Rollins MRN: [id number] DOB: [birth year] Today's Date: 08/11/2012 Time: 1020-1103 OT Time Calculation (min): 43 min  OT Assessment / Plan / Recommendation Clinical Impression  Pt is a 79 yr old female admitted after fall resulting in L rib fxs, T8 burst fx, T12 comp fx, and Sternal fx.  Currently needs total assist +2 (pt 30%) for mobility, LB selfcare, and transfers.  Will benefit from OT in acute care setting to help increase overall independence.  Feel pt will likely need SNF for follow-up secondary to not having 24 hour physical assist at discharge.    OT Assessment  Patient needs continued OT Services    Follow Up Recommendations  SNF    Barriers to Discharge Decreased caregiver support    Equipment Recommendations  None recommended by OT       Frequency  Min 2X/week    Precautions / Restrictions Precautions Precautions: Back Precaution Comments: pt with Sternal fx.  No Precautions ordered.   Required Braces or Orthoses: Spinal Brace Spinal Brace: Thoracolumbosacral orthotic;Applied in supine position Restrictions Weight Bearing Restrictions: No   Pertinent Vitals/Pain Pain 4/10 on faces scale in her ribs and back     ADL  Eating/Feeding: Simulated;Independent Where Assessed - Eating/Feeding: Chair Grooming: Performed;Wash/dry face;Set up Where Assessed - Grooming: Supported sitting Upper Body Bathing: Simulated;Set up Where Assessed - Upper Body Bathing: Supine, head of bed flat Lower Body Bathing: Simulated;+2 Total assistance Lower Body Bathing: Patient Percentage: 40% Where Assessed - Lower Body Bathing: Supported sit to stand Upper Body Dressing: Performed;+2 Total assistance;Other (comment) (To donn TLSO in supine) Upper Body Dressing: Patient Percentage: 20% Where Assessed - Upper Body Dressing: Rolling right and/or left;Supine, head of bed flat Lower Body Dressing: Simulated;+2 Total  assistance Lower Body Dressing: Patient Percentage: 30% Where Assessed - Lower Body Dressing: Supported sit to stand Toilet Transfer: Simulated;+2 Total assistance Toilet Transfer: Patient Percentage: 30% Toilet Transfer Method: Surveyor, minerals: Bedside commode;Other (comment) (simulated to bedside chair) Toileting - Clothing Manipulation and Hygiene: Simulated;+2 Total assistance Toileting - Clothing Manipulation and Hygiene: Patient Percentage: 30% Where Assessed - Toileting Clothing Manipulation and Hygiene: Sit to stand from 3-in-1 or toilet Tub/Shower Transfer Method: Not assessed Equipment Used: Back brace;Rolling walker Transfers/Ambulation Related to ADLs: Pt required total assist +2 (pt 30%) for stand pivot transfer to bedside chair. ADL Comments: Pt with decreased ability to maintain sitting EOB without therapist support.  Frequent lean and LOB posteriorly.    OT Diagnosis: Generalized weakness;Acute pain  OT Problem List: Decreased strength;Decreased activity tolerance;Impaired balance (sitting and/or standing);Decreased knowledge of use of DME or AE;Decreased knowledge of precautions;Pain OT Treatment Interventions: Self-care/ADL training;Therapeutic activities;Patient/family education;DME and/or AE instruction;Balance training   OT Goals Acute Rehab OT Goals OT Goal Formulation: With patient Time For Goal Achievement: 08/25/12 ADL Goals Pt Will Perform Grooming: with min assist;Standing at sink ADL Goal: Grooming - Progress: Goal set today Pt Will Perform Lower Body Bathing: with min assist;with adaptive equipment;Sit to stand from bed ADL Goal: Lower Body Bathing - Progress: Goal set today Pt Will Perform Lower Body Dressing: with min assist;Sit to stand from bed;with adaptive equipment ADL Goal: Lower Body Dressing - Progress: Goal set today Pt Will Transfer to Toilet: with min assist;with DME;3-in-1;Maintaining back safety precautions ADL Goal:  Toilet Transfer - Progress: Goal set today Pt Will Perform Toileting - Clothing Manipulation: with min assist;Standing ADL Goal: Toileting - Clothing Manipulation - Progress: Goal set today Pt Will  Perform Toileting - Hygiene: with min assist;Sit to stand from 3-in-1/toilet ADL Goal: Toileting - Hygiene - Progress: Goal set today  Visit Information  Last OT Received On: 08/11/12 Assistance Needed: +2    Subjective Data  Subjective: I was trying to get my cat down from the attic, he climbs up there. Patient Stated Goal: Pt did not state.   Prior Functioning     Home Living Lives With: Spouse (spouse has cancer and cannot physically assist.) Available Help at Discharge: Family;Available PRN/intermittently Type of Home: House Home Access: Ramped entrance Home Layout: One level (one step up to den and to kitchen inside of the house) Bathroom Shower/Tub: Health visitor: Standard Bathroom Accessibility: Yes Home Adaptive Equipment: Bedside commode/3-in-1;Shower chair with back;Walker - rolling Prior Function Level of Independence: Independent Able to Take Stairs?: Yes Driving: Yes Vocation: Retired Musician: No difficulties Dominant Hand: Right         Vision/Perception Vision - History Baseline Vision: No visual deficits Patient Visual Report: No change from baseline Vision - Assessment Vision Assessment: Vision not tested Perception Perception: Within Functional Limits Praxis Praxis: Intact   Cognition  Cognition Overall Cognitive Status: Appears within functional limits for tasks assessed/performed Arousal/Alertness: Awake/alert Orientation Level: Appears intact for tasks assessed Behavior During Session: [facility] for tasks performed    Extremity/Trunk Assessment Right Upper Extremity Assessment RUE ROM/Strength/Tone: WFL for tasks assessed RUE Sensation: WFL - Light Touch RUE Coordination: WFL - gross motor Left Upper  Extremity Assessment LUE ROM/Strength/Tone: WFL for tasks assessed LUE Sensation: WFL - Light Touch LUE Coordination: WFL - gross/fine motor Right Lower Extremity Assessment RLE ROM/Strength/Tone: WFL for tasks assessed RLE Sensation: WFL - Light Touch Left Lower Extremity Assessment LLE ROM/Strength/Tone: WFL for tasks assessed LLE Sensation: WFL - Light Touch Trunk Assessment Trunk Assessment: Normal     Mobility Bed Mobility Bed Mobility: Rolling Right;Rolling Left;Right Sidelying to Sit;Sitting - Scoot to [place] of Bed Rolling Right: 3: Mod assist;With rail Rolling Left: 3: Mod assist;With rail Right Sidelying to Sit: 1: +2 Total assist;With rails Right Sidelying to Sit: Patient Percentage: 40% Sitting - Scoot to Edge of Bed: 2: Max assist Details for Bed Mobility Assistance: cues for sequencing, log roll, safe technique.   Transfers Sit to Stand: 1: +2 Total assist;With upper extremity assist;From bed Sit to Stand: Patient Percentage: 30% Stand to Sit: 1: +2 Total assist;With upper extremity assist;To chair/3-in-1;With armrests Stand to Sit: Patient Percentage: 30% Details for Transfer Assistance: cues for UEs to knees to minimize strain to sternum, step-by-step through SPT.          Balance Balance Balance Assessed: Yes Static Sitting Balance Static Sitting - Balance Support: Right upper extremity supported;Left upper extremity supported;Feet supported Static Sitting - Level of Assistance: 3: Mod assist   End of Session OT - End of Session Equipment Utilized During Treatment: Gait belt;Back brace Activity Tolerance: Patient limited by pain Patient left: in chair;with call bell/phone within reach Nurse Communication: Mobility status      MCGUIRE,JAMES OTR/L Pager number [phone] 08/11/2012, 1:43 PM

## 2012-08-11 NOTE — Progress Notes (Signed)
Needs aggressive PT and then possibly CIR.  PT has not put recommendation into the chart yet.  Patient slow to go right now.  This patient has been seen and I agree with the findings and treatment plan.  Marta Lamas. Gae Bon, MD, FACS [phone] (pager) [phone] (direct pager) Trauma Surgeon

## 2012-08-11 NOTE — Evaluation (Signed)
Physical Therapy Evaluation Patient Details Name: Amber Rollins MRN: [id number] DOB: 1931-02-27 Today's Date: 08/11/2012 Time: 7829-5621 PT Time Calculation (min): 45 min  PT Assessment / Plan / Recommendation Clinical Impression  pt presents after falling through ceiling and sustaining L rib fxs, T8 burst fx, T12 comp fx, and Sternal fx.  pt very painful, but motivated.  pt's O2 sats 88-92% on 3L O2 during mobility.  pt will need SNF at D/C to maximize independence prior to return to home with husband who is ill.      PT Assessment  Patient needs continued PT services    Follow Up Recommendations  SNF    Does the patient have the potential to tolerate intense rehabilitation      Barriers to Discharge None      Equipment Recommendations  None recommended by PT    Recommendations for Other Services     Frequency Min 5X/week    Precautions / Restrictions Precautions Precautions: Back Precaution Comments: pt with Sternal fx.  No Precautions ordered.   Required Braces or Orthoses: Spinal Brace Spinal Brace: Thoracolumbosacral orthotic;Applied in supine position Restrictions Weight Bearing Restrictions: No   Pertinent Vitals/Pain Pt did not rate, but indicates most pain in sternum.  Premedicated prior to mobility.        Mobility  Bed Mobility Bed Mobility: Rolling Right;Rolling Left;Right Sidelying to Sit;Sitting - Scoot to [place] of Bed Rolling Right: 3: Mod assist;With rail Rolling Left: 3: Mod assist;With rail Right Sidelying to Sit: 1: +2 Total assist;With rails Right Sidelying to Sit: Patient Percentage: 40% Sitting - Scoot to Edge of Bed: 2: Max assist Details for Bed Mobility Assistance: cues for sequencing, log roll, safe technique.   Transfers Transfers: Sit to Stand;Stand to Sit;Stand Pivot Transfers Sit to Stand: 1: +2 Total assist;With upper extremity assist;From bed Sit to Stand: Patient Percentage: 30% Stand to Sit: 1: +2 Total assist;With upper  extremity assist;To chair/3-in-1;With armrests Stand to Sit: Patient Percentage: 40% Stand Pivot Transfers: 1: +2 Total assist Stand Pivot Transfers: Patient Percentage: 40% Details for Transfer Assistance: cues for UEs to knees to minimize strain to sternum, step-by-step through SPT.   Ambulation/Gait Ambulation/Gait Assistance: Not tested (comment) Stairs: No Wheelchair Mobility Wheelchair Mobility: No    Exercises     PT Diagnosis: Difficulty walking;Acute pain  PT Problem List: Decreased strength;Decreased activity tolerance;Decreased balance;Decreased mobility;Decreased knowledge of use of DME;Decreased knowledge of precautions;Other (comment);Cardiopulmonary status limiting activity PT Treatment Interventions: DME instruction;Gait training;Functional mobility training;Therapeutic activities;Therapeutic exercise;Balance training;Patient/family education   PT Goals Acute Rehab PT Goals PT Goal Formulation: With patient Time For Goal Achievement: 08/25/12 Potential to Achieve Goals: Good Pt will Roll Supine to Right Side: with min assist PT Goal: Rolling Supine to Right Side - Progress: Goal set today Pt will Roll Supine to Left Side: with min assist PT Goal: Rolling Supine to Left Side - Progress: Goal set today Pt will go Supine/Side to Sit: with min assist PT Goal: Supine/Side to Sit - Progress: Goal set today Pt will go Sit to Supine/Side: with min assist PT Goal: Sit to Supine/Side - Progress: Goal set today Pt will go Sit to Stand: with min assist PT Goal: Sit to Stand - Progress: Goal set today Pt will go Stand to Sit: with min assist PT Goal: Stand to Sit - Progress: Goal set today Pt will Ambulate: 51 - 150 feet;with min assist;with rolling walker PT Goal: Ambulate - Progress: Goal set today  Visit Information  Last PT Received  On: 08/11/12 Assistance Needed: +2 PT/OT Co-Evaluation/Treatment: Yes    Subjective Data  Subjective: I just can't believe I did this.    Patient Stated Goal: Home with Husband   Prior Functioning  Home Living Lives With: Spouse (spouse has cancer and cannot physically assist.) Available Help at Discharge: Family;Available PRN/intermittently Type of Home: House Home Access: Ramped entrance Home Layout: One level (one step up to den and to kitchen inside of the house) Bathroom Shower/Tub: Health visitor: Standard Bathroom Accessibility: Yes Home Adaptive Equipment: Bedside commode/3-in-1;Shower chair with back;Walker - rolling Prior Function Level of Independence: Independent Able to Take Stairs?: Yes Driving: Yes Vocation: Retired Musician: No difficulties Dominant Hand: Right    Cognition  Cognition Overall Cognitive Status: Appears within functional limits for tasks assessed/performed Arousal/Alertness: Awake/alert Orientation Level: Appears intact for tasks assessed Behavior During Session: [facility] for tasks performed    Extremity/Trunk Assessment Right Lower Extremity Assessment RLE ROM/Strength/Tone: WFL for tasks assessed RLE Sensation: WFL - Light Touch Left Lower Extremity Assessment LLE ROM/Strength/Tone: WFL for tasks assessed LLE Sensation: WFL - Light Touch Trunk Assessment Trunk Assessment: Normal   Balance Balance Balance Assessed: No  End of Session PT - End of Session Equipment Utilized During Treatment: Gait belt;Back brace Activity Tolerance: Patient limited by pain Patient left: in chair;with call bell/phone within reach Nurse Communication: Mobility status  GP     RitenourAlison Rollins, [place] [phone] 08/11/2012, 1:26 PM

## 2012-08-11 NOTE — Progress Notes (Signed)
Doing well this am , less pain   -has not been up yet  Temp:  [97.3 F (36.3 C)-98.4 F (36.9 C)] 97.3 F (36.3 C) (03/14 0502) Pulse Rate:  [74-88] 77 (03/14 0502) Resp:  [16] 16 (03/14 0502) BP: (114-148)/(52-63) 115/52 mmHg (03/14 0502) SpO2:  [90 %-93 %] 93 % (03/14 0502) Good strength and sensation   Plan: Flat bedrest, log roll only without brace  -     With brace may increase HOB and sit and get OOB as tolerates

## 2012-08-11 NOTE — Progress Notes (Signed)
Patient evaluated for long-term disease management services with [facility] as a benefit of her [employer]. Appears therapy recommendation is SNF. Patient states she lives with husband who has cancer. Reports she is normally her husband's caregiver. If patient should discharge home she  will receive a post discharge transition of care call. Will continue to follow for disposition. Left [facility] brochure and contact information at bedside.   Hal Morales, [facility], [phone]

## 2012-08-11 NOTE — Clinical Social Work Note (Signed)
Clinical Social Worker continuing to follow patient for support and discharge planning needs.  Per PT/OT, patient recommendations are for SNF.  CSW spoke with patient at bedside who states she is agreeable with SNF search in [facility] with preference to [employer] and [employer].  CSW will initiate fax out and follow up with patient regarding bed offers.  Patient clearly states no current alcohol use.  SBIRT complete and no resources needed.  Macario Golds, [place] [phone]

## 2012-08-11 NOTE — Progress Notes (Signed)
LOS: 3 days   Subjective: No new complaints. Patient denies N/V. Pain is better controlled since yesterday.   Objective: Vital signs in last 24 hours: Temp:  [97.3 F (36.3 C)-98.4 F (36.9 C)] 97.3 F (36.3 C) (03/14 0502) Pulse Rate:  [74-88] 77 (03/14 0502) Resp:  [16] 16 (03/14 0502) BP: (114-148)/(52-63) 115/52 mmHg (03/14 0502) SpO2:  [90 %-93 %] 93 % (03/14 0502) Last BM Date: 08/11/12   Laboratory  CBC  Recent Labs  08/08/12 1850 08/10/12 1114  WBC 25.9* 11.8*  HGB 13.2 11.0*  HCT 38.6 32.9*  PLT 238 165   BMET  Recent Labs  08/08/12 1850 08/10/12 1114  NA 135 137  K 2.9* 3.5  CL 95* 99  CO2 27 30  GLUCOSE 197* 115*  BUN 21 14  CREATININE 0.86 0.68  CALCIUM 9.3 8.5     Physical Exam General appearance: alert, cooperative and no distress Resp: clear to auscultation bilaterally Cardio: regular rate and rhythm, S1, S2 normal, no murmur, click, rub or gallop GI: soft, non-tender; bowel sounds normal; no masses,  no organomegaly   Assessment/Plan: Fall  Sternal fx -- Pain control  Left rib fx -- Needs IS, check CXR with SQE  T8/T12 fxs -- TLSO  Leg lac -- Local care  Multiple medical problems -- Home meds  FEN -- D/C foley VTE -- SCD's, Lovenox  Dispo -- awaiting PT/OT evals   Ralene Muskrat, PA-S   08/11/2012

## 2012-08-12 DIAGNOSIS — N390 Urinary tract infection, site not specified: Secondary | ICD-10-CM | POA: Diagnosis not present

## 2012-08-12 LAB — URINALYSIS, ROUTINE W REFLEX MICROSCOPIC
Glucose, UA: NEGATIVE mg/dL
Specific Gravity, Urine: 1.021 (ref 1.005–1.030)
Urobilinogen, UA: 1 mg/dL (ref 0.0–1.0)

## 2012-08-12 LAB — URINE MICROSCOPIC-ADD ON

## 2012-08-12 MED ORDER — CIPROFLOXACIN HCL 500 MG PO TABS
500.0000 mg | ORAL_TABLET | Freq: Two times a day (BID) | ORAL | Status: DC
  Administered 2012-08-12 – 2012-08-14 (×4): 500 mg via ORAL
  Filled 2012-08-12 (×6): qty 1

## 2012-08-12 NOTE — Progress Notes (Signed)
Patient ID: Amber Rollins, female   DOB: 16-Jun-1930, 79 y.o.   MRN: [id number]   LOS: 4 days   Subjective: C/o urinary frequency, odor. Pain controlled.  Objective: Vital signs in last 24 hours: Temp:  [97.8 F (36.6 C)-98 F (36.7 C)] 97.8 F (36.6 C) (03/15 0537) Pulse Rate:  [76-99] 99 (03/15 0537) Resp:  [18] 18 (03/15 0537) BP: (116-128)/(55-57) 116/56 mmHg (03/15 0537) SpO2:  [72 %-97 %] 93 % (03/15 0900) Last BM Date: 08/11/12   IS:   Physical Exam General appearance: alert and no distress Resp: clear to auscultation bilaterally Cardio: Mild tachycardia   Assessment/Plan: Fall  Sternal fx -- Pain control  Left rib fx -- Encouraged pulmonary toilet T8/T12 fxs -- TLSO  Leg lac -- Local care  Multiple medical problems -- Home meds  FEN -- Check urine with frequency, odor VTE -- SCD's, Lovenox  Dispo -- SNF this week    Freeman Caldron, PA-C Pager: [phone] General Trauma PA Pager: [phone]   08/12/2012

## 2012-08-12 NOTE — ED Provider Notes (Signed)
Medical screening examination/treatment/procedure(s) were conducted as a shared visit with non-physician practitioner(s) and myself.  I personally evaluated the patient during the encounter  [name], MD 08/12/12 [phone]

## 2012-08-12 NOTE — Progress Notes (Signed)
Pt OOB in brace x 2 today for a total of 5 hours.  Pt tolerated OOB fairly well.  Pain increases with increased time in chair.  At the request of pt and daughter, she would like to attempt to manage symptoms without narcotics.  Pt feel as though they are making her "act differently." Will continue to monitor and offer non pharmacological pain management interventions.  Rusell Meneely, [facility]

## 2012-08-12 NOTE — Progress Notes (Signed)
Physical Therapy Treatment Patient Details Name: Amber Rollins MRN: [id number] DOB: [birth year] Today's Date: 08/12/2012 Time: 4098-1191 PT Time Calculation (min): 24 min  PT Assessment / Plan / Recommendation Comments on Treatment Session  Pt states she is discouraged.  Required motivational techniques to get pt to progress mobility.  02 sats 94% after ambulating on 4 liters.    Follow Up Recommendations        Does the patient have the potential to tolerate intense rehabilitation     Barriers to Discharge        Equipment Recommendations  None recommended by PT    Recommendations for Other Services    Frequency Min 5X/week   Plan Discharge plan remains appropriate    Precautions / Restrictions Precautions Precautions: Back Precaution Comments: pt with Sternal fx.  No Precautions ordered.   Required Braces or Orthoses: Spinal Brace Spinal Brace: Thoracolumbosacral orthotic;Applied in supine position Restrictions Weight Bearing Restrictions: No Other Position/Activity Restrictions:  (HOB 10 degrees or less without brace on)   Pertinent Vitals/Pain 8/10 in back and ribs.  Had pain meds about 1 hour prior to session.      Mobility  Bed Mobility Bed Mobility: Not assessed Transfers Transfers: Sit to Stand;Stand to Sit Sit to Stand: 3: Mod assist;With upper extremity assist;With armrests;From chair/3-in-1 Stand to Sit: 3: Mod assist;With upper extremity assist;To chair/3-in-1 Details for Transfer Assistance:  (cues for safest and easiest technique.  Pt practiced twice) Ambulation/Gait Ambulation/Gait Assistance: 4: Min assist Ambulation Distance (Feet): 12 Feet Assistive device: Rolling walker Ambulation/Gait Assistance Details: assist to push RW forward Gait Pattern: Step-to pattern Gait velocity: decreased Stairs: No    Exercises General Exercises - Lower Extremity Ankle Circles/Pumps: AROM;Both;5 reps;Seated Long Arc Quad: AROM;Both;5 reps;Seated   PT  Diagnosis:    PT Problem List:   PT Treatment Interventions:     PT Goals Acute Rehab PT Goals Time For Goal Achievement: 08/25/12 Pt will Roll Supine to Right Side: with min assist PT Goal: Rolling Supine to Right Side - Progress: Other (comment) (NT) Pt will Roll Supine to Left Side: with min assist PT Goal: Rolling Supine to Left Side - Progress: Other (comment) (NT) Pt will go Supine/Side to Sit: with min assist PT Goal: Supine/Side to Sit - Progress: Other (comment) (NT) Pt will go Sit to Supine/Side: with min assist PT Goal: Sit to Supine/Side - Progress: Other (comment) (NT) Pt will go Sit to Stand: with min assist PT Goal: Sit to Stand - Progress: Progressing toward goal Pt will go Stand to Sit: with min assist PT Goal: Stand to Sit - Progress: Progressing toward goal Pt will Ambulate: 51 - 150 feet;with min assist;with rolling walker PT Goal: Ambulate - Progress: Progressing toward goal  Visit Information  Last PT Received On: 08/12/12 Assistance Needed: +2    Subjective Data  Subjective: I'm useless   Cognition  Cognition Overall Cognitive Status: Appears within functional limits for tasks assessed/performed Arousal/Alertness: Awake/alert Orientation Level: Appears intact for tasks assessed Behavior During Session: [facility] for tasks performed    Balance     End of Session PT - End of Session Equipment Utilized During Treatment: Gait belt;Back brace Activity Tolerance: Patient limited by fatigue;Patient limited by pain Patient left: in chair;with call bell/phone within reach Nurse Communication: Mobility status   GP     Donnella Sham 08/12/2012, 11:05 AM Lavona Mound, PT  [phone] 08/12/2012

## 2012-08-12 NOTE — Progress Notes (Signed)
Patient ID: Amber Rollins, female   DOB: 02/09/1931, 79 y.o.   MRN: [id number] oob with brace. Stable.

## 2012-08-12 NOTE — Progress Notes (Signed)
I have seen and examined the pt and agree with PA-Jeffery's note. 

## 2012-08-13 MED ORDER — HYDROCODONE-ACETAMINOPHEN 5-325 MG PO TABS
0.5000 | ORAL_TABLET | ORAL | Status: DC | PRN
  Administered 2012-08-13 – 2012-08-14 (×5): 1 via ORAL
  Filled 2012-08-13: qty 1
  Filled 2012-08-13: qty 2
  Filled 2012-08-13 (×3): qty 1

## 2012-08-13 NOTE — Progress Notes (Signed)
This patient has been seen and I agree with the findings and treatment plan.  Liyah Higham O. Stillman Buenger, III, MD, FACS [phone] (pager) [phone] (direct pager) Trauma Surgeon  

## 2012-08-13 NOTE — Progress Notes (Signed)
Physical Therapy Treatment Patient Details Name: Amber Rollins MRN: [id number] DOB: [birth year] Today's Date: 08/13/2012 Time: 1122-1201 PT Time Calculation (min): 39 min  PT Assessment / Plan / Recommendation Comments on Treatment Session  Required encouragement to participate with therapy today. Pt with 3 episodes of urine incontinence with session today. RN aware. Increased time for pericare/cleaning. Pt requires cues for safety as she can demo impulsivity at times with mobiltiy.    Follow Up Recommendations  SNF           Equipment Recommendations  None recommended by PT       Frequency Min 5X/week   Plan Discharge plan remains appropriate;Frequency remains appropriate    Precautions / Restrictions Precautions Precautions: Back Precaution Comments: pt with Sternal fx.  No Precautions ordered.   Required Braces or Orthoses: Spinal Brace Spinal Brace: Thoracolumbosacral orthotic;Applied in supine position Restrictions Weight Bearing Restrictions: No       Mobility  Bed Mobility Rolling Right: 4: Min assist;With rail Rolling Left: 4: Min assist;With rail Right Sidelying to Sit: 3: Mod assist;With rails;HOB flat Right Sidelying to Sit: Patient Percentage: 60% Sitting - Scoot to Edge of Bed: 4: Min assist Details for Bed Mobility Assistance: cues for sequencing, log roll, safe technique.  pad under pt used to facilitate full roll onto side. rolled to left x2 and right x3 for pericare/bedpan use and to don brace. Transfers Sit to Stand: 3: Mod assist;With upper extremity assist;From bed;From chair/3-in-1;With armrests Sit to Stand: Patient Percentage: 60% Stand to Sit: 3: Mod assist;To chair/3-in-1;With upper extremity assist;With armrests Stand to Sit: Patient Percentage: 60% Stand Pivot Transfers: 3: Mod assist Details for Transfer Assistance: cues for hand placement with each sit<>stand transfer, for safety with stand<>pivot transfer (pt reaching for BSC vs  holding on to walker for transfer). cues to back/pivot all the way to the surface before sitting down, total assist to ensure safe sit into recliner due to pt attempting to sit prematureley. Ambulation/Gait Ambulation/Gait Assistance: Not tested (comment) (pt too tired from multiple bathroom trips today )     PT Goals Acute Rehab PT Goals Pt will Roll Supine to Right Side: with min assist PT Goal: Rolling Supine to Right Side - Progress: Met Pt will Roll Supine to Left Side: with min assist PT Goal: Rolling Supine to Left Side - Progress: Met Pt will go Supine/Side to Sit: with min assist PT Goal: Supine/Side to Sit - Progress: Progressing toward goal Pt will go Sit to Stand: with min assist PT Goal: Sit to Stand - Progress: Progressing toward goal Pt will go Stand to Sit: with min assist PT Goal: Stand to Sit - Progress: Progressing toward goal  Visit Information  Last PT Received On: 08/13/12 Assistance Needed: +2    Subjective Data  Subjective: "Oh no, I got to pee now!".   Cognition  Cognition Arousal/Alertness: Awake/alert Orientation Level: Appears intact for tasks assessed Behavior During Session: [facility] for tasks performed       End of Session PT - End of Session Equipment Utilized During Treatment: Gait belt;Back brace Activity Tolerance: Patient limited by fatigue;Patient limited by pain Patient left: in chair;with call bell/phone within reach;with family/visitor present Nurse Communication: Mobility status;Patient requests pain meds   GP     Sallyanne Kuster 08/13/2012, 12:14 PM  Sallyanne Kuster, PTA Office- [phone]

## 2012-08-13 NOTE — Progress Notes (Signed)
Patient ID: Amber Rollins, female   DOB: Sep 22, 1930, 79 y.o.   MRN: [id number] Afeb. VSS No new neuro issues Increasing activity with brace Continue present rx.

## 2012-08-13 NOTE — Progress Notes (Signed)
Patient ID: Amber Rollins, female   DOB: Dec 24, 1930, 79 y.o.   MRN: [id number]   LOS: 5 days   Subjective: No new c/o, urinary symptoms are a little better.  Objective: Vital signs in last 24 hours: Temp:  [97.8 F (36.6 C)-98.6 F (37 C)] 98.6 F (37 C) (03/16 0619) Pulse Rate:  [71-84] 71 (03/16 0619) Resp:  [18] 18 (03/16 0619) BP: (119-127)/(54-69) 127/54 mmHg (03/16 0619) SpO2:  [86 %-96 %] 86 % (03/16 0619) Last BM Date: 08/11/12   IS: (+28ml)   Physical Exam General appearance: alert and no distress Resp: clear to auscultation bilaterally Cardio: regular rate and rhythm GI: normal findings: bowel sounds normal and soft, non-tender Wound: LLE laceration clean   Assessment/Plan: Fall  Sternal fx -- Pain control  Left rib fx -- Encouraged pulmonary toilet  T8/T12 fxs -- TLSO  Left leg lac -- Local care  UTI -- Cipro D2/3 Multiple medical problems -- Home meds  FEN -- RN notes pt wants to try to avoid narcotics secondary to the way they make her feel. I already have her on adjunctive meds so will lower Norco scale.  VTE -- SCD's, Lovenox  Dispo -- SNF this week    Freeman Caldron, PA-C Pager: [phone] General Trauma PA Pager: [phone]   08/13/2012

## 2012-08-14 LAB — URINE CULTURE: Colony Count: >=100000

## 2012-08-14 MED ORDER — NAPROXEN 500 MG PO TABS: 500.0000 mg | ORAL_TABLET | Freq: Two times a day (BID) | ORAL | Status: DC

## 2012-08-14 MED ORDER — CIPROFLOXACIN HCL 500 MG PO TABS: 500.0000 mg | ORAL_TABLET | Freq: Two times a day (BID) | ORAL | Status: AC

## 2012-08-14 MED ORDER — BISACODYL 10 MG RE SUPP
10.0000 mg | Freq: Once | RECTAL | Status: AC
  Administered 2012-08-14: 10 mg via RECTAL
  Filled 2012-08-14: qty 1

## 2012-08-14 MED ORDER — TRAMADOL HCL 50 MG PO TABS: 100.0000 mg | ORAL_TABLET | Freq: Four times a day (QID) | ORAL | Status: DC

## 2012-08-14 MED ORDER — POLYETHYLENE GLYCOL 3350 17 G PO PACK
17.0000 g | PACK | Freq: Every day | ORAL | Status: DC
  Administered 2012-08-14: 17 g via ORAL
  Filled 2012-08-14: qty 1

## 2012-08-14 MED ORDER — HYDROCODONE-ACETAMINOPHEN 5-325 MG PO TABS: 0.5000 | ORAL_TABLET | ORAL | Status: DC | PRN

## 2012-08-14 NOTE — Progress Notes (Signed)
Seen, agree with above.   SNF today.

## 2012-08-14 NOTE — Discharge Summary (Signed)
Seen, agree with above.   To SNF today.

## 2012-08-14 NOTE — Progress Notes (Signed)
Physical Therapy Treatment Patient Details Name: Amber Rollins MRN: [id number] DOB: 10-12-1930 Today's Date: 08/14/2012 Time: 6578-4696 PT Time Calculation (min): 28 min  PT Assessment / Plan / Recommendation Comments on Treatment Session  Patient progressing with mobility. Fatique still limiting increase acticity and ambulation.     Follow Up Recommendations  SNF     Does the patient have the potential to tolerate intense rehabilitation     Barriers to Discharge        Equipment Recommendations  None recommended by PT    Recommendations for Other Services    Frequency Min 5X/week   Plan Discharge plan remains appropriate;Frequency remains appropriate    Precautions / Restrictions Precautions Precautions: Back Precaution Comments: pt with Sternal fx.  No Precautions ordered.   Required Braces or Orthoses: Spinal Brace Spinal Brace: Thoracolumbosacral orthotic;Applied in supine position   Pertinent Vitals/Pain     Mobility  Bed Mobility Rolling Right: 4: Min assist;With rail Right Sidelying to Sit: With rails;HOB flat;4: Min assist Details for Bed Mobility Assistance: cues for sequencing, log roll, safe technique.  A to roll fully to side in order to don brace. A to lift shoulder up off of bed Transfers Sit to Stand: 3: Mod assist;With upper extremity assist;From bed;From chair/3-in-1;With armrests Stand to Sit: To chair/3-in-1;With upper extremity assist;With armrests;4: Min assist Stand Pivot Transfers: 4: Min assist Details for Transfer Assistance: cues for hand placement with each sit<>stand transfer. A to initate stand and to ensure balance and stability. Cues to sit slowly and not to arch back with sitting Ambulation/Gait Ambulation/Gait Assistance: Not tested (comment)    Exercises     PT Diagnosis:    PT Problem List:   PT Treatment Interventions:     PT Goals Acute Rehab PT Goals PT Goal: Rolling Supine to Right Side - Progress: Progressing toward  goal PT Goal: Rolling Supine to Left Side - Progress: Progressing toward goal PT Goal: Sit to Supine/Side - Progress: Progressing toward goal PT Goal: Sit to Stand - Progress: Progressing toward goal PT Goal: Stand to Sit - Progress: Progressing toward goal PT Goal: Ambulate - Progress: Progressing toward goal  Visit Information  Assistance Needed: +1    Subjective Data      Cognition  Cognition Overall Cognitive Status: Appears within functional limits for tasks assessed/performed Arousal/Alertness: Awake/alert Orientation Level: Appears intact for tasks assessed Behavior During Session: [facility] for tasks performed    Balance     End of Session PT - End of Session Equipment Utilized During Treatment: Gait belt;Back brace Activity Tolerance: Patient limited by fatigue;Patient limited by pain Patient left: in chair;with call bell/phone within reach;with family/visitor present Nurse Communication: Mobility status;Patient requests pain meds   GP     Salih Williamson, Adline Potter 08/14/2012, 1:18 PM 08/14/2012 Fredrich Birks PTA [phone] pager [phone] office

## 2012-08-14 NOTE — Clinical Social Work Note (Signed)
Clinical Social Worker facilitated patient discharge including contacting patient daughter Jamesetta So) and facility to confirm discharge plans.  CSW arranged ambulance transportation via [facility] to [facility].  RN to call report prior to discharge.  CSW received insurance authorization from [facility] for placement and transportation.  Clinical Social Worker will sign off for now as social work intervention is no longer needed. Please consult [place] again if new need arises.  Macario Golds, [place] [phone]

## 2012-08-14 NOTE — Progress Notes (Signed)
Patient ID: Amber Rollins, female   DOB: [birth year], 79 y.o.   MRN: [id number]    Subjective: Pt feels ok.  No new complaints.  Objective: Vital signs in last 24 hours: Temp:  [98 F (36.7 C)-99.7 F (37.6 C)] 98.1 F (36.7 C) (03/17 0610) Pulse Rate:  [68-83] 78 (03/17 0610) Resp:  [17-19] 18 (03/17 0610) BP: (115-169)/(58-70) 169/63 mmHg (03/17 0610) SpO2:  [86 %-100 %] 100 % (03/17 0610) Weight:  [148 lb 8 oz (67.359 kg)] 148 lb 8 oz (67.359 kg) (03/16 1622) Last BM Date: 08/11/12  Intake/Output from previous day: 03/16 0701 - 03/17 0700 In: 850 [P.O.:850] Out: 200 [Urine:200] Intake/Output this shift:    PE: Heart: regular Lungs: CTAB, O2 in place Abd: TLSO in place  Lab Results:  No results found for this basename: WBC, HGB, HCT, PLT,  in the last 72 hours BMET No results found for this basename: NA, K, CL, CO2, GLUCOSE, BUN, CREATININE, CALCIUM,  in the last 72 hours PT/INR No results found for this basename: LABPROT, INR,  in the last 72 hours CMP     Component Value Date/Time   NA 137 08/10/2012 1114   K 3.5 08/10/2012 1114   CL 99 08/10/2012 1114   CO2 30 08/10/2012 1114   GLUCOSE 115* 08/10/2012 1114   BUN 14 08/10/2012 1114   CREATININE 0.68 08/10/2012 1114   CREATININE 1.03 12/24/2011 1514   CALCIUM 8.5 08/10/2012 1114   PROT 6.3 12/24/2011 1514   ALBUMIN 4.0 12/24/2011 1514   AST 17 12/24/2011 1514   ALT 16 12/24/2011 1514   ALKPHOS 50 12/24/2011 1514   BILITOT 0.3 12/24/2011 1514   GFRNONAA 81* 08/10/2012 1114   GFRAA >90 08/10/2012 1114   Lipase     Component Value Date/Time   LIPASE 22 10/01/2010 1030       Studies/Results: No results found.  Anti-infectives: Anti-infectives   Start     Dose/Rate Route Frequency Ordered Stop   08/12/12 2000  ciprofloxacin (CIPRO) tablet 500 mg     500 mg Oral 2 times daily 08/12/12 1450 08/15/12 1959       Assessment/Plan   Fall  Sternal fx -- Pain control  Left rib fx -- Encouraged pulmonary toilet   T8/T12 fxs -- TLSO  Left leg lac -- Local care  UTI -- Cipro D3/3  Multiple medical problems -- Home meds  FEN -- RN notes pt wants to try to avoid narcotics secondary to the way they make her feel. I already have her on adjunctive meds so will lower Norco scale.  VTE -- SCD's, Lovenox  Dispo -- SNF likely today   LOS: 6 days    Meilyn Heindl E 08/14/2012, 11:41 AM Pager: [phone]

## 2012-08-14 NOTE — Progress Notes (Signed)
Patient ID: Amber Rollins, female   DOB: [birth year], 79 y.o.   MRN: [id number] Vital signs stable. Patient transferring with assistance. Able to bear weight and lower extremities.  Patient to be transferred to [facility] today.  Followup will be as outpatient with Dr. Phoebe Perch.

## 2012-08-14 NOTE — Discharge Summary (Signed)
Physician Discharge Summary  Patient ID: Amber Rollins MRN: [id number] DOB/AGE: [birth year] 79 y.o.  Admit date: 08/08/2012 Discharge date: 08/14/2012  Discharge Diagnoses Patient Active Problem List   Diagnosis Date Noted  . UTI (urinary tract infection) 08/12/2012  . Acute blood loss anemia 08/10/2012  . Fall 08/09/2012  . Sternal fracture 08/09/2012  . Left rib fracture 08/09/2012  . T8 vertebral fracture 08/09/2012  . T12 compression fracture 08/09/2012  . Laceration of lower leg 08/09/2012  . Hemorrhoid 12/24/2011  . Dysuria 12/24/2011  . Low back pain 12/24/2011  . Hematuria 12/24/2011  . Sinusitis, bacterial 07/26/2011  . Gastroenteritis 10/06/2010  . Hypokalemia 10/06/2010  . HEMATURIA UNSPECIFIED 10/02/2009  . NIGHT SWEATS 10/02/2009  . GERD 06/18/2009  . INCONTINENCE, URGE 06/18/2009  . FATIGUE 03/12/2009  . HYPOKALEMIA 10/08/2008  . MENOPAUSAL SYNDROME 01/22/2008  . EDEMA 07/19/2007  . ANXIETY STATE, UNSPECIFIED 06/21/2007  . REACTION, ACUTE STRESS W/EMOTIONAL DSTURB 03/06/2007  . OVARIAN MASS 03/06/2007  . FOOT PAIN, CHRONIC 03/06/2007  . ALLERGIC RHINITIS, SEASONAL 09/12/2006  . IRRITABLE BOWEL SYNDROME 09/12/2006  . FIBROCYSTIC BREAST DISEASE 09/12/2006  . OSTEOARTHRITIS 09/12/2006  . HEMORRHOIDS, HX OF 09/12/2006  . HYSTERECTOMY, HX OF 09/12/2006  . HYPERTENSION 05/31/2000  . OSTEOPENIA 11/29/1999  . HYPERLIPIDEMIA 05/31/1992    Consultants Dr. Colon Branch for neurosurgery   Procedures Repair of left lower leg laceration by Raymon Mutton, PA-C   HPI: Jaysie was in her attic looking for her cat when she fell through the ceiling landing on her head and causing a flexion injury of of the neck and spine. She did not lose consciousness and was brought to [facility] for evaluation. Her workup showed a single rib fracture as well as significant T8 and T12 fractures. She had a laceration on her leg that was closed by the emergency  department physician assistant. Transfer was arranged for admission to the trauma service at [facility] and neurosurgery was consulted.   Hospital Course: Neurosurgery recommended mobilization in a TLSO brace. This was ordered and once the patient's pain but brought under reasonable control she was mobilized with physical and occupational therapy. Because of the need to don and doff the brace while supine and to wear the brace any time she is sitting or standing they recommended skilled nursing facility placement for further care. After her foley catheter was removed she complained of urinary frequency and a foul odor to her urine. A urinalysis was suggestive of an infection and she was begun on empiric ciprofloxacin. She will need to continue that through the end of 3/18.  Her medical problems remained stable while she was in the hospital. Her lower leg sutures should be removed on or about 3/21 and it would be appreciated if someone at the skilled nursing facility would do that. She was discharged there in stable condition.      Medication List    TAKE these medications       CALCIUM 1000 + D PO  Take 1 capsule by mouth daily.     CENTRUM CARDIO PO  Take by mouth daily.     ciprofloxacin 500 MG tablet  Commonly known as:  CIPRO  Take 1 tablet (500 mg total) by mouth 2 (two) times daily.     fexofenadine 180 MG tablet  Commonly known as:  ALLEGRA  Take 180 mg by mouth daily as needed.     Fish Oil 1200 MG Caps  Take 2 by mouth daily  FLONASE 50 MCG/ACT nasal spray  Generic drug:  fluticasone  1-2 sprays each nostril daily.     hydrochlorothiazide 25 MG tablet  Commonly known as:  HYDRODIURIL  TAKE 2 TABLETS EVERY DAY     HYDROcodone-acetaminophen 5-325 MG per tablet  Commonly known as:  NORCO/VICODIN  Take 0.5-2 tablets by mouth every 4 (four) hours as needed for pain.     KLOR-CON M10 10 MEQ tablet  Generic drug:  potassium chloride  TAKE 1 TABLET (10 MEQ TOTAL) BY  MOUTH DAILY.     naproxen 500 MG tablet  Commonly known as:  NAPROSYN  Take 1 tablet (500 mg total) by mouth 2 (two) times daily with a meal.     NON FORMULARY  Allergy shots, every other week     omeprazole 20 MG capsule  Commonly known as:  PRILOSEC  Take 1 capsule (20 mg total) by mouth 2 (two) times daily.     PARoxetine 10 MG tablet  Commonly known as:  PAXIL  TAKE 1 TABLET (10 MG TOTAL) BY MOUTH DAILY.     traMADol 50 MG tablet  Commonly known as:  ULTRAM  Take 2 tablets (100 mg total) by mouth every 6 (six) hours.             Follow-up Information   Schedule an appointment as soon as possible for a visit with HIRSCH,JAMES R, MD.   Contact information:   [address] [place] [address] [phone]       Call [facility]. (As needed)    Contact information:   [address] [place] [address] [phone]      Discharge planning took greater than 30 minutes.   Signed: Freeman Caldron, PA-C Pager: [phone] General Trauma PA Pager: [phone]  08/14/2012, 2:56 PM

## 2012-08-15 ENCOUNTER — Non-Acute Institutional Stay (SKILLED_NURSING_FACILITY): Payer: Medicare Other | Admitting: Internal Medicine

## 2012-08-15 ENCOUNTER — Telehealth: Payer: Self-pay | Admitting: *Deleted

## 2012-08-15 DIAGNOSIS — D62 Acute posthemorrhagic anemia: Secondary | ICD-10-CM

## 2012-08-15 NOTE — Telephone Encounter (Signed)
Pt's daughter wanted to let you know that pt fell from her attic down through to her den. She was just d/c from hospital yesterday to [facility]. Daughter needs to know if she can have the pneumonia vaccine.

## 2012-08-15 NOTE — Telephone Encounter (Signed)
I'm glad she is recovering - she can have pneumovax -- I have in chart a documented allergy to tetanus (tdap) but not pneumovax

## 2012-08-15 NOTE — Telephone Encounter (Signed)
Pt's daughter notified of Dr. Royden Purl recommendations

## 2012-08-16 NOTE — Progress Notes (Signed)
Patient ID: Amber Rollins, female   DOB: [birth year], 79 y.o.   MRN: [id number]  [facility]  ALLERGIES/INTOLERANCES:   TDAP.  Amlodipine.   Codeine.   Latex.   Lovastatin.   Morphine and related substances.   Penicillins.   Sertraline.   Simvastatin.   Sulfonamide derivatives.   CHIEF COMPLAINT:  Manage T8 and T12 fractures, acute blood loss anemia and hypertension.   HISTORY OF PRESENT ILLNESS:  79 year old, Caucasian female fell, pulled towards the ceiling and suffered a flexion injury of the leg and spine.  In the emergency room, she was diagnosed with a rib fracture, significant T8 and T12 fractures.  Neurosurgery recommended mobilization in a TLSO brace.  She is admitted to this facility for short-term rehabilitation.  Patient denies any pain currently.      Acute blood loss anemia.  The patient also had a laceration of her leg that was repaired in the emergency room.  She suffered acute blood loss anemia postoperatively.  She denies fatigue, melena or hematochezia.  Last hemoglobin level was 13.2.    Hypertension.  The patient is tolerating her antihypertensives without any problems.  Last blood pressure was 139/69.  She denies headaches, dizziness or visual disturbances.    PAST MEDICAL HISTORY:   Hyperlipidemia.   Hypertension.   Osteoarthritis.   Chronic pain secondary to Morton's neuroma.    Plantar fasciitis.   Severe vasomotor symptoms.   Cystocele.   Hemorrhoids.   PAST SURGICAL HISTORY:  Partial hysterectomy secondary to endometriosis.  Neuromas in the foot.  Anterior repair with RSO. Mass of right adnexa removal.   FAMILY HISTORY:  MOTHER:   Mother had diabetes, coronary artery disease and hypertension.   FATHER:  Father had COPD.  SIBLINGS:  Brother had diabetes mellitus.  Sister has diabetes mellitus.   SOCIAL HISTORY:   TOBACCO USE:  Denies smoking.   ALCOHOL:  Denies alcohol use.  ILLICIT DRUGS:  Denies illicit drug use.    CURRENT MEDICATIONS:  Per MAR.   REVIEW OF SYSTEMS:   GI:  Complains of abdominal soreness and nausea, but denies vomiting, diarrhea or constipation.   See HPI.  Otherwise, 14-point review of systems is negative.   PHYSICAL EXAMINATION:  VITAL SIGNS:  PULSE:  87.  BLOOD PRESSURE:  139/69.  TEMPERATURE: 98.2.  RESPIRATIONS:  18.  O2 SATURATIONS:  Pulse ox 90% room air.  GENERAL APPEARANCE:  No acute distress.  Well-nourished.  Normal body habitus.   EYES:   CONJUNCTIVAE/LIDS:   Conjunctivae appear normal.  Sclerae appear normal.  Eyelids normal bilaterally.   PUPILS:  Pupils equal and reactive.   MOUTH/THROAT:  Lips without lesions.  No lesions noted in mouth.  Tongue is without lesions.  Oropharynx without redness or lesions.  Uvula elevates midline.  Teeth are in good repair.   NECK/THYROID:   Supple.  No elevation of the jugular venous pulsation.  Trachea midline.  No neck masses.  No thyroid tenderness.  No thyroid nodule.  No thyroid enlargement.   CHEST/RESPIRATORY:  Breathing is even and unlabored.  Breath sounds are clear to auscultation bilaterally.  Chest is nontender and normal in contour.   CARDIOVASCULAR:   CARDIAC:  Rhythm regular.  No murmur.  No extra heart sounds.   ARTERIAL:  Normal pedal pulses.   EDEMA/VARICOSITIES:  No edema.   GASTROINTESTINAL:   ABDOMEN:  Diffusely tender to palpation.  No bowel sounds, but no rebound or guarding.   LIVER/SPLEEN/KIDNEY:  No hepatomegaly or  tenderness is noted.  No splenomegaly.   HERNIA:  No abdominal wall hernia.   MUSCULOSKELETAL:    HEAD:  Normal to inspection and palpation.   BACK:  No kyphosis.  No scoliosis.  No spinous process tenderness.   EXTREMITIES:   LEFT UPPER EXTREMITY:  Full range of motion.  Normal strength and tone.   RIGHT UPPER EXTREMITY:  Full range of motion.  Normal strength and tone.   LEFT LOWER EXTREMITY:  Full range of motion.  Normal strength and tone.   RIGHT LOWER EXTREMITY:  Full range of  motion.  Normal strength and tone.   PSYCHIATRIC:  The patient is alert and oriented to self and place.  Affect and behavior are appropriate.    LABORATORY DATA: White count 25.9, otherwise CBC normal.  Potassium 2.9, glucose 197, otherwise BMP normal.   CT of the chest:  T12 compression fracture.    Chest x-ray:  No acute disease.    CT of the cervical spine:  No acute findings.    CT of the abdomen and pelvis:  T12 compression fracture, cholelithiasis.   ASSESSMENT/PLAN:   T12 compression fracture (            ).  Continue TLSO brace and rehabilitation.    Hypertension 401.1.  Well controlled.    Acute blood loss anemia 285.1.  Reassess hemoglobin level.   Nausea (            ).  New problem.  Start phenergan 25 mg q.6 p.r.n.  CT of the abdomen negative for acute findings.    UTI 599.0.  Completed Cipro.   Hypokalemia 276.8.  On supplementation.  Recheck.    GERD 530.81.  Well controlled.   Allergic rhinitis 477.0.  Well controlled.   V58.69.  Check CBC and BMP.   I have reviewed patient's medical records from hospitalization.   CPT CODE:  16109.

## 2012-08-28 ENCOUNTER — Encounter: Payer: Self-pay | Admitting: Adult Health

## 2012-08-30 ENCOUNTER — Inpatient Hospital Stay (HOSPITAL_COMMUNITY)
Admission: AD | Admit: 2012-08-30 | Discharge: 2012-09-14 | DRG: 516 | Disposition: A | Discharge status: Skilled Nursing Facility | Payer: Medicare Other | Source: Ambulatory Visit | Attending: Neurosurgery | Admitting: Neurosurgery

## 2012-08-30 ENCOUNTER — Inpatient Hospital Stay (HOSPITAL_COMMUNITY): Payer: Medicare Other

## 2012-08-30 ENCOUNTER — Other Ambulatory Visit: Payer: Self-pay | Admitting: Neurosurgery

## 2012-08-30 ENCOUNTER — Encounter (HOSPITAL_COMMUNITY): Payer: Self-pay | Admitting: General Practice

## 2012-08-30 DIAGNOSIS — K219 Gastro-esophageal reflux disease without esophagitis: Secondary | ICD-10-CM | POA: Diagnosis present

## 2012-08-30 DIAGNOSIS — E785 Hyperlipidemia, unspecified: Secondary | ICD-10-CM | POA: Diagnosis present

## 2012-08-30 DIAGNOSIS — N390 Urinary tract infection, site not specified: Secondary | ICD-10-CM | POA: Diagnosis present

## 2012-08-30 DIAGNOSIS — S22009A Unspecified fracture of unspecified thoracic vertebra, initial encounter for closed fracture: Principal | ICD-10-CM | POA: Diagnosis present

## 2012-08-30 DIAGNOSIS — I10 Essential (primary) hypertension: Secondary | ICD-10-CM | POA: Diagnosis present

## 2012-08-30 DIAGNOSIS — A498 Other bacterial infections of unspecified site: Secondary | ICD-10-CM | POA: Diagnosis present

## 2012-08-30 DIAGNOSIS — M40299 Other kyphosis, site unspecified: Secondary | ICD-10-CM | POA: Diagnosis present

## 2012-08-30 DIAGNOSIS — A0472 Enterocolitis due to Clostridium difficile, not specified as recurrent: Secondary | ICD-10-CM | POA: Diagnosis present

## 2012-08-30 DIAGNOSIS — W1789XA Other fall from one level to another, initial encounter: Secondary | ICD-10-CM | POA: Diagnosis present

## 2012-08-30 DIAGNOSIS — Y92009 Unspecified place in unspecified non-institutional (private) residence as the place of occurrence of the external cause: Secondary | ICD-10-CM

## 2012-08-30 DIAGNOSIS — M1990 Unspecified osteoarthritis, unspecified site: Secondary | ICD-10-CM | POA: Diagnosis present

## 2012-08-30 DIAGNOSIS — F411 Generalized anxiety disorder: Secondary | ICD-10-CM | POA: Diagnosis present

## 2012-08-30 HISTORY — DX: Other specified postprocedural states: Z98.890

## 2012-08-30 HISTORY — DX: Adverse effect of unspecified anesthetic, initial encounter: T41.45XA

## 2012-08-30 HISTORY — DX: Other specified postprocedural states: R11.2

## 2012-08-30 HISTORY — DX: Gastro-esophageal reflux disease without esophagitis: K21.9

## 2012-08-30 HISTORY — DX: Other complications of anesthesia, initial encounter: T88.59XA

## 2012-08-30 LAB — CBC WITH DIFFERENTIAL/PLATELET
Basophils Absolute: 0 10*3/uL (ref 0.0–0.1)
Eosinophils Relative: 12 % — ABNORMAL HIGH (ref 0–5)
Lymphocytes Relative: 26 % (ref 12–46)
Lymphs Abs: 2 10*3/uL (ref 0.7–4.0)
MCV: 90.2 fL (ref 78.0–100.0)
Neutro Abs: 4.2 10*3/uL (ref 1.7–7.7)
Neutrophils Relative %: 54 % (ref 43–77)
Platelets: 365 10*3/uL (ref 150–400)
RBC: 4.2 MIL/uL (ref 3.87–5.11)
RDW: 14.5 % (ref 11.5–15.5)
WBC: 7.8 10*3/uL (ref 4.0–10.5)

## 2012-08-30 LAB — BASIC METABOLIC PANEL
CO2: 27 mEq/L (ref 19–32)
Calcium: 9.5 mg/dL (ref 8.4–10.5)
Creatinine, Ser: 1.11 mg/dL — ABNORMAL HIGH (ref 0.50–1.10)
GFR calc Af Amer: 53 mL/min — ABNORMAL LOW (ref >90)
GFR calc non Af Amer: 46 mL/min — ABNORMAL LOW (ref >90)
Sodium: 133 mEq/L — ABNORMAL LOW (ref 135–145)

## 2012-08-30 LAB — URINALYSIS, ROUTINE W REFLEX MICROSCOPIC
Glucose, UA: NEGATIVE mg/dL
Ketones, ur: NEGATIVE mg/dL
Protein, ur: NEGATIVE mg/dL
Urobilinogen, UA: 0.2 mg/dL (ref 0.0–1.0)

## 2012-08-30 LAB — PROTIME-INR
INR: 1.01 (ref 0.00–1.49)
Prothrombin Time: 13.2 seconds (ref 11.6–15.2)

## 2012-08-30 LAB — APTT: aPTT: 30 seconds (ref 24–37)

## 2012-08-30 MED ORDER — ACETAMINOPHEN 325 MG PO TABS: 650.0000 mg | ORAL_TABLET | Freq: Four times a day (QID) | ORAL | Status: DC | PRN

## 2012-08-30 MED ORDER — ADULT MULTIVITAMIN W/MINERALS CH
1.0000 | ORAL_TABLET | Freq: Every day | ORAL | Status: DC
  Administered 2012-08-30 – 2012-09-14 (×15): 1 via ORAL
  Filled 2012-08-30 (×16): qty 1

## 2012-08-30 MED ORDER — TRAMADOL HCL 50 MG PO TABS
50.0000 mg | ORAL_TABLET | Freq: Four times a day (QID) | ORAL | Status: DC | PRN
  Administered 2012-08-30 – 2012-09-11 (×4): 50 mg via ORAL
  Filled 2012-08-30 (×5): qty 1

## 2012-08-30 MED ORDER — POTASSIUM CHLORIDE CRYS ER 10 MEQ PO TBCR
10.0000 meq | EXTENDED_RELEASE_TABLET | Freq: Every day | ORAL | Status: DC
  Administered 2012-08-30 – 2012-09-14 (×16): 10 meq via ORAL
  Filled 2012-08-30 (×16): qty 1

## 2012-08-30 MED ORDER — MAGNESIUM HYDROXIDE 400 MG/5ML PO SUSP: 30.0000 mL | Freq: Every day | ORAL | Status: DC | PRN

## 2012-08-30 MED ORDER — HYDROCHLOROTHIAZIDE 50 MG PO TABS
50.0000 mg | ORAL_TABLET | Freq: Every day | ORAL | Status: DC
  Administered 2012-08-30 – 2012-09-14 (×16): 50 mg via ORAL
  Filled 2012-08-30 (×16): qty 1

## 2012-08-30 MED ORDER — NAPROXEN 500 MG PO TABS
500.0000 mg | ORAL_TABLET | Freq: Two times a day (BID) | ORAL | Status: DC
  Administered 2012-08-30 – 2012-09-11 (×21): 500 mg via ORAL
  Administered 2012-09-11 (×2): 250 mg via ORAL
  Administered 2012-09-12 – 2012-09-14 (×6): 500 mg via ORAL
  Filled 2012-08-30 (×34): qty 1

## 2012-08-30 MED ORDER — BISACODYL 10 MG RE SUPP
10.0000 mg | Freq: Every day | RECTAL | Status: DC | PRN
  Administered 2012-08-31: 10 mg via RECTAL
  Filled 2012-08-30 (×2): qty 1

## 2012-08-30 MED ORDER — ACETAMINOPHEN 650 MG RE SUPP: 650.0000 mg | Freq: Four times a day (QID) | RECTAL | Status: DC | PRN

## 2012-08-30 MED ORDER — TRAMADOL HCL 50 MG PO TABS: 100.0000 mg | ORAL_TABLET | Freq: Four times a day (QID) | ORAL | Status: DC

## 2012-08-30 MED ORDER — DOCUSATE SODIUM 100 MG PO CAPS
100.0000 mg | ORAL_CAPSULE | Freq: Two times a day (BID) | ORAL | Status: DC
  Administered 2012-08-30 – 2012-09-08 (×10): 100 mg via ORAL
  Filled 2012-08-30 (×10): qty 1

## 2012-08-30 MED ORDER — LORATADINE 10 MG PO TABS
10.0000 mg | ORAL_TABLET | Freq: Every day | ORAL | Status: DC
  Administered 2012-08-31 – 2012-09-14 (×15): 10 mg via ORAL
  Filled 2012-08-30 (×16): qty 1

## 2012-08-30 MED ORDER — FLUTICASONE PROPIONATE 50 MCG/ACT NA SUSP
1.0000 | Freq: Every day | NASAL | Status: DC
  Administered 2012-08-31 – 2012-09-01 (×2): 2 via NASAL
  Administered 2012-09-05 – 2012-09-07 (×2): 1 via NASAL
  Administered 2012-09-08: 2 via NASAL
  Administered 2012-09-09: 1 via NASAL
  Administered 2012-09-10 – 2012-09-12 (×3): 2 via NASAL
  Administered 2012-09-13 – 2012-09-14 (×2): 1 via NASAL
  Filled 2012-08-30 (×2): qty 16

## 2012-08-30 MED ORDER — HYDROCODONE-ACETAMINOPHEN 5-325 MG PO TABS
1.0000 | ORAL_TABLET | ORAL | Status: DC | PRN
  Administered 2012-08-30: 2 via ORAL
  Administered 2012-08-31 – 2012-09-01 (×3): 1 via ORAL
  Administered 2012-09-01: 2 via ORAL
  Administered 2012-09-02 – 2012-09-11 (×15): 1 via ORAL
  Administered 2012-09-12 – 2012-09-13 (×2): 2 via ORAL
  Filled 2012-08-30: qty 2
  Filled 2012-08-30 (×2): qty 1
  Filled 2012-08-30: qty 2
  Filled 2012-08-30 (×2): qty 1
  Filled 2012-08-30: qty 2
  Filled 2012-08-30: qty 1
  Filled 2012-08-30: qty 2
  Filled 2012-08-30 (×8): qty 1
  Filled 2012-08-30: qty 2
  Filled 2012-08-30 (×4): qty 1
  Filled 2012-08-30: qty 2
  Filled 2012-08-30 (×2): qty 1

## 2012-08-30 MED ORDER — PAROXETINE HCL 10 MG PO TABS
10.0000 mg | ORAL_TABLET | Freq: Every day | ORAL | Status: DC
  Administered 2012-08-30 – 2012-09-14 (×16): 10 mg via ORAL
  Filled 2012-08-30 (×16): qty 1

## 2012-08-30 MED ORDER — PANTOPRAZOLE SODIUM 40 MG PO TBEC
40.0000 mg | DELAYED_RELEASE_TABLET | Freq: Every day | ORAL | Status: DC
  Administered 2012-08-31 – 2012-09-14 (×15): 40 mg via ORAL
  Filled 2012-08-30 (×15): qty 1

## 2012-08-30 NOTE — Progress Notes (Signed)
Patient arrived to unit via medical transport.  Patient's daughter at the bedside.  Patient oriented to the room and [facility] policies.  Charge RN paged Dr. Phoebe Perch and I am awaiting orders.  Lance Bosch, RN

## 2012-08-30 NOTE — H&P (Signed)
Subjective: 79 yo F fell from attic 3/17  Sustained multiple injuries including a T8 burst  fx and T12 comp fx - seen in F/U and pt has worsening kyphosis at T8 - admitted for w/u and probable  Surgery -   Patient Active Problem List   Diagnosis Date Noted  . UTI (urinary tract infection) 08/12/2012  . Acute blood loss anemia 08/10/2012  . Fall 08/09/2012  . Sternal fracture 08/09/2012  . Left rib fracture 08/09/2012  . T8 vertebral fracture 08/09/2012  . T12 compression fracture 08/09/2012  . Laceration of lower leg 08/09/2012  . Hemorrhoid 12/24/2011  . Dysuria 12/24/2011  . Low back pain 12/24/2011  . Hematuria 12/24/2011  . Sinusitis, bacterial 07/26/2011  . Gastroenteritis 10/06/2010  . Hypokalemia 10/06/2010  . HEMATURIA UNSPECIFIED 10/02/2009  . NIGHT SWEATS 10/02/2009  . GERD 06/18/2009  . INCONTINENCE, URGE 06/18/2009  . FATIGUE 03/12/2009  . HYPOKALEMIA 10/08/2008  . MENOPAUSAL SYNDROME 01/22/2008  . EDEMA 07/19/2007  . ANXIETY STATE, UNSPECIFIED 06/21/2007  . REACTION, ACUTE STRESS W/EMOTIONAL DSTURB 03/06/2007  . OVARIAN MASS 03/06/2007  . FOOT PAIN, CHRONIC 03/06/2007  . ALLERGIC RHINITIS, SEASONAL 09/12/2006  . IRRITABLE BOWEL SYNDROME 09/12/2006  . FIBROCYSTIC BREAST DISEASE 09/12/2006  . OSTEOARTHRITIS 09/12/2006  . HEMORRHOIDS, HX OF 09/12/2006  . HYSTERECTOMY, HX OF 09/12/2006  . HYPERTENSION 05/31/2000  . OSTEOPENIA 11/29/1999  . HYPERLIPIDEMIA 05/31/1992   Past Medical History  Diagnosis Date  . Hyperlipemia 05/31/1992  . Hypertension 05/31/2000  . Osteopenia 11/29/1999  . Osteoarthritis   . Pedal edema     worsened by Norvasc  . Chronic foot pain     mortons neuroma  . Plantar fasciitis   . Menopausal symptoms     vasomotor symptoms-severe  . Cystocele 01/01    neg. sx  . Hemorrhoids   . fracture fibula ? 2003    left  . Complication of anesthesia   . PONV (postoperative nausea and vomiting)   . GERD (gastroesophageal reflux disease)      Past Surgical History  Procedure Laterality Date  . Partial hysterectomy  1970    endometriosis  . Foot problems      neuromas  . Anterior repari w//surg proc  06/30/99    RSO  . History of ct      mass right adnexa, U?S by GYN-observation  . Bladder tack    . Bladder suspension    . Cataracts      Prescriptions prior to admission  Medication Sig Dispense Refill  . Calcium Carb-Cholecalciferol (CALCIUM 1000 + D PO) Take 1 capsule by mouth daily.       . fexofenadine (ALLEGRA) 180 MG tablet Take 180 mg by mouth daily as needed.        . fluticasone (FLONASE) 50 MCG/ACT nasal spray 1-2 sprays each nostril daily.      . hydrochlorothiazide (HYDRODIURIL) 25 MG tablet Take 50 mg by mouth every morning.      Marland Kitchen HYDROcodone-acetaminophen (NORCO/VICODIN) 5-325 MG per tablet Take 0.5-2 tablets by mouth every 4 (four) hours as needed for pain.  36 tablet  0  . KLOR-CON M10 10 MEQ tablet TAKE 1 TABLET (10 MEQ TOTAL) BY MOUTH DAILY.  30 tablet  3  . Multiple Vitamins-Minerals (CENTRUM CARDIO PO) Take by mouth daily.        . naproxen (NAPROSYN) 500 MG tablet Take 1 tablet (500 mg total) by mouth 2 (two) times daily with a meal.      .  NON FORMULARY Allergy shots, every other week       . Omega-3 Fatty Acids (FISH OIL) 1200 MG CAPS Take 2 by mouth daily       . omeprazole (PRILOSEC) 20 MG capsule Take 1 capsule (20 mg total) by mouth 2 (two) times daily.  180 capsule  3  . PARoxetine (PAXIL) 10 MG tablet TAKE 1 TABLET (10 MG TOTAL) BY MOUTH DAILY.  30 tablet  11  . traMADol (ULTRAM) 50 MG tablet Take 2 tablets (100 mg total) by mouth every 6 (six) hours.       Allergies  Allergen Reactions  . Tdap (Diphth-Acell Pertussis-Tetanus) Swelling  . Amlodipine Besylate     REACTION: edema  . Codeine     headache  . Lovastatin     REACTION: leg pain  . Penicillins     Unknown reaction  . Sertraline Hcl     REACTION: caused insomnia  . Simvastatin     REACTION: non tolerant- leg pain  .  Latex Rash  . Sulfonamide Derivatives Rash    History  Substance Use Topics  . Smoking status: Never Smoker   . Smokeless tobacco: Never Used  . Alcohol Use: No    Family History  Problem Relation Age of Onset  . Diabetes Mother   . Heart disease Mother     pacemaker  . Hypertension Mother   . COPD Father   . Diabetes Sister   . Diabetes Brother   . Mental illness Son     anxiety-mental health problems  . Diabetes Brother   . Diabetes Sister   . Diabetes Sister   . Hypertension Other     Review of Systems C/o pain in multiple areas , recent UTI - o/w neagative  Objective: Vital signs in last 24 hours:  AAOx3 ,   Moves all 4 well, can ambulate - in TLSO brace but on too loose - tightened   Sensory intact    Data Review T spine x-rays - worsening kyphosis centered at T8 fx  Assessment/Plan: Admit - bedrest  - CT T spine - ? Surgical stabilization    Clydene Fake, MD 08/30/2012 3:02 PM

## 2012-08-31 ENCOUNTER — Other Ambulatory Visit: Payer: Self-pay | Admitting: Neurosurgery

## 2012-08-31 ENCOUNTER — Encounter (HOSPITAL_COMMUNITY): Payer: Self-pay | Admitting: Pharmacy Technician

## 2012-08-31 NOTE — Progress Notes (Signed)
Subjective: Patient reports no c/o  Objective: Vital signs in last 24 hours: Temp:  [97.6 F (36.4 C)-98 F (36.7 C)] 97.8 F (36.6 C) (04/03 0600) Pulse Rate:  [72-81] 81 (04/03 0600) Resp:  [18] 18 (04/03 0600) BP: (97-146)/(51-66) 146/61 mmHg (04/03 0600) SpO2:  [91 %-95 %] 92 % (04/03 0600) Weight:  [59.421 kg (131 lb)] 59.421 kg (131 lb) (04/02 1500)  Intake/Output from previous day:   Intake/Output this shift:    moves all 4 well  Lab Results:  Recent Labs  08/30/12 1508  WBC 7.8  HGB 12.8  HCT 37.9  PLT 365   BMET  Recent Labs  08/30/12 1508  NA 133*  K 4.1  CL 98  CO2 27  GLUCOSE 106*  BUN 24*  CREATININE 1.11*  CALCIUM 9.5    Studies/Results: Ct Thoracic Spine Wo Contrast  08/31/2012  *RADIOLOGY REPORT*  Clinical Data: 79 year old female with back pain.  Fall in March with a T8 fracture.  CT THORACIC SPINE WITHOUT CONTRAST  Technique:  Multidetector CT imaging of the thoracic spine was performed without intravenous contrast administration. Multiplanar CT image reconstructions were also generated  Comparison: 08/08/2012.  Findings: Both the T8 and T12 fractures demonstrated in March show additional loss of height. Both fractures show moderate to severe comminution of the vertebral body.  Loss of height is severe at T8 and mild bony retropulsion has increased.  Spinal canal narrowing at T8 results when combined with posterior element facet and ligament flavum hypertrophy; AP spinal canal measures 7-9 mm.  A mildly displaced fracture of the right T8 transverse process marginally involves the pedicle (series 4 for image 19). There is a nondisplaced fracture of the right eighth rib costovertebral junction.  The There is a minimally-displaced T8 spinous process fracture re-identified.  At T12 overall loss of vertebral body height is moderate. Retropulsion of the posterior-superior endplate narrows the spinal canal to 9-10 mm AP dimension.  The left T12 pedicle is  fractured. They may also be a nondisplaced fracture of the left T12 lamina (series 4 of five image 44).  The right T12 pedicle remains intact as does the spinous process.  Additionally, there is a compression fracture of T7 primarily affecting the inferior endplate and with retropulsion of the posterior inferior endplate fragment (sagittal image 810).  This was subtle on the comparison.  Loss of T7 vertebral body height now is moderate.  Retropulsion results in narrowing of the AP spinal canal at this level to 810 9 mm.  The T7 posterior elements are intact except for possible nondisplaced spinous process fracture.  Minimally-displaced fracture of the right T9 transverse process is re-identified. There are there are non displaced or mildly displaced fractures of the right posterior or lateral 4th, 7th, and ninth ribs.  Likewise there nondisplaced mildly displaced are left rib fractures at the third, fifth, sixth, seventh, and tenth ribs.  Elsewhere thoracic vertebral height and alignment are stable. Visualized upper lumbar levels appear intact.  Comminuted fracture of the manubrium re-identified and now mildly angulated.  Continued confluent dependent pulmonary opacity appears represent a combination of atelectasis and consolidation.  No pneumothorax. Only trace bilateral subpulmonic pleural fluid is present.  Major airways remain patent.  No pericardial effusion.  Stable small mediastinal lymph nodes.  Cough stable visualized upper abdominal viscera.  IMPRESSION: 1.  T8 and T12 severely comminuted compression versus burst fractures again noted.  Interval loss of height (severe and moderate, respectively).  Retropulsion of bone at both levels  result in a minimum thoracic spinal canal AP dimension of 7 mm (at T8).  Posterior element fractures at both levels including left T12 pedicle fracture and possible nondisplaced right T8 pedicle fracture. 2.  T7 inferior endplate compression fracture with interval moderate loss  of height and retropulsion of the posterior inferior endplate also with mild spinal canal narrowing.  Right T9 transverse process fracture. 3. Comminuted manubrium fracture.  Numerous bilateral rib fractures (right ribs 4, 7, 9 and left ribs 3, 5, 6, 7, and 10). 4.  Bilateral dependent pulmonary atelectasis and consolidation. Only trace pleural effusions.  No pneumothorax.   Original Report Authenticated By: Erskine Speed, M.D.     Assessment/Plan: T7, &T8, T12  fx's ,  Continue bedrest -  Will need surgery -  After discussion with pt  - will proceed with T7, T8, T12 kyphoplasty on monday    LOS: 1 day     Keyosha Tiedt R, MD 08/31/2012, 2:08 PM

## 2012-08-31 NOTE — Progress Notes (Signed)
Patient evaluated for long-term disease management services with [facility] Care Management Program as a benefit of her [employer]. Noted patient was recently at [facility]. Explained [facility] Care Care Management services at bedside to patient.States she does not want to return to [facility] at discharge. Interested in services and explained to patient that [facility] Care Management will follow if and when she returns home. If discharges to SNF, advised patient to call once she returns home eventually. Voiced understanding and signed consents at bedside. Appreciative of visit and [facility] Care Management packet left at bedside.  Raiford Noble, MSN-Ed, RN,BSN, [facility], [phone]

## 2012-09-01 NOTE — Progress Notes (Signed)
Doing well. +BM  ,  Tired of bedrest  Temp:  [97.4 F (36.3 C)-98.9 F (37.2 C)] 97.4 F (36.3 C) (04/04 0600) Pulse Rate:  [74-87] 74 (04/04 0600) Resp:  [17-18] 18 (04/04 0600) BP: (119-169)/(58-81) 126/67 mmHg (04/04 0600) SpO2:  [94 %-96 %] 95 % (04/04 0600) Good strength and sensation   Plan: To OR monday

## 2012-09-02 LAB — URINE CULTURE: Colony Count: >=100000

## 2012-09-02 MED ORDER — ONDANSETRON HCL 4 MG PO TABS
4.0000 mg | ORAL_TABLET | Freq: Three times a day (TID) | ORAL | Status: DC | PRN
  Administered 2012-09-02 – 2012-09-12 (×3): 4 mg via ORAL
  Filled 2012-09-02 (×4): qty 1

## 2012-09-02 NOTE — Progress Notes (Signed)
No new issues or problems. Denies lower extremity pain paresthesias numbness or weakness. Back pain controlled.  Afebrile with stable vitals. Neuro exam intact.  Plan for percutaneous vertebroplasty on Monday per Dr. Charmayne Sheer. Continue bedrest.

## 2012-09-03 ENCOUNTER — Inpatient Hospital Stay (HOSPITAL_COMMUNITY): Payer: Medicare Other

## 2012-09-03 LAB — CBC WITH DIFFERENTIAL/PLATELET
Eosinophils Absolute: 1.3 10*3/uL — ABNORMAL HIGH (ref 0.0–0.7)
Eosinophils Relative: 18 % — ABNORMAL HIGH (ref 0–5)
Hemoglobin: 13.7 g/dL (ref 12.0–15.0)
Lymphs Abs: 1.7 10*3/uL (ref 0.7–4.0)
MCH: 30.9 pg (ref 26.0–34.0)
MCV: 89.4 fL (ref 78.0–100.0)
Monocytes Relative: 7 % (ref 3–12)
RBC: 4.43 MIL/uL (ref 3.87–5.11)

## 2012-09-03 LAB — SURGICAL PCR SCREEN: Staphylococcus aureus: NEGATIVE

## 2012-09-03 LAB — BASIC METABOLIC PANEL
CO2: 30 mEq/L (ref 19–32)
Glucose, Bld: 118 mg/dL — ABNORMAL HIGH (ref 70–99)
Potassium: 3.9 mEq/L (ref 3.5–5.1)
Sodium: 135 mEq/L (ref 135–145)

## 2012-09-03 MED ORDER — VANCOMYCIN HCL IN DEXTROSE 1-5 GM/200ML-% IV SOLN
1000.0000 mg | INTRAVENOUS | Status: AC
  Administered 2012-09-04: 1000 mg via INTRAVENOUS
  Filled 2012-09-03: qty 200

## 2012-09-03 MED ORDER — DEXTROSE 5 % IV SOLN
1.0000 g | INTRAVENOUS | Status: AC
  Administered 2012-09-03 – 2012-09-09 (×7): 1 g via INTRAVENOUS
  Filled 2012-09-03 (×9): qty 10

## 2012-09-03 NOTE — Progress Notes (Signed)
MEDICATION RELATED CONSULT NOTE - INITIAL   Pharmacy Consult for Abx management  Indication: UTI  Allergies  Allergen Reactions  . Tdap (Diphth-Acell Pertussis-Tetanus) Swelling  . Amlodipine Besylate     REACTION: edema  . Codeine     headache  . Lovastatin     REACTION: leg pain  . Penicillins     Unknown reaction  . Sertraline Hcl     REACTION: caused insomnia  . Simvastatin     REACTION: non tolerant- leg pain  . Latex Rash  . Sulfonamide Derivatives Rash    Patient Measurements: Height: 5\' 3"  (160 cm) Weight: 131 lb (59.421 kg) IBW/kg (Calculated) : 52.4  Vital Signs: Temp: 98.2 F (36.8 C) (04/06 0556) Temp src: Oral (04/06 0556) BP: 149/72 mmHg (04/06 0556) Pulse Rate: 87 (04/06 0556) Intake/Output from previous day: 04/05 0701 - 04/06 0700 In: 280 [P.O.:280] Out: -  Intake/Output from this shift:    Labs:  Recent Labs  09/03/12 0810  WBC 7.0  HGB 13.7  HCT 39.6  PLT 308  CREATININE 0.91   Estimated Creatinine Clearance: 41.5 ml/min (by C-G formula based on Cr of 0.91).   Microbiology: Recent Results (from the past 720 hour(s))  URINE CULTURE     Status: None   Collection Time    08/12/12  1:56 PM      Result Value Range Status   Specimen Description URINE, CLEAN CATCH   Final   Special Requests NONE   Final   Culture  Setup Time 08/12/2012 21:34   Final   Colony Count >=100,000 COLONIES/ML   Final   Culture     Final   Value: Multiple bacterial morphotypes present, none predominant. Suggest appropriate recollection if clinically indicated.   Report Status 08/14/2012 FINAL   Final  URINE CULTURE     Status: None   Collection Time    08/30/12 10:59 PM      Result Value Range Status   Specimen Description URINE, CLEAN CATCH   Final   Special Requests NONE   Final   Culture  Setup Time 08/31/2012 09:23   Final   Colony Count >=100,000 COLONIES/ML   Final   Culture ESCHERICHIA COLI   Final   Report Status 09/02/2012 FINAL   Final   Organism ID, Bacteria ESCHERICHIA COLI   Final    Medical History: Past Medical History  Diagnosis Date  . Hyperlipemia 05/31/1992  . Hypertension 05/31/2000  . Osteopenia 11/29/1999  . Osteoarthritis   . Pedal edema     worsened by Norvasc  . Chronic foot pain     mortons neuroma  . Plantar fasciitis   . Menopausal symptoms     vasomotor symptoms-severe  . Cystocele 01/01    neg. sx  . Hemorrhoids   . fracture fibula ? 2003    left  . Complication of anesthesia   . PONV (postoperative nausea and vomiting)   . GERD (gastroesophageal reflux disease)     Assessment: 79 y/o female patient to undergo vertebroplasty in am. Urine cx grew Ecoli with sensitivities to zosyn, cephs, AG and Macrobid. Given patients drug allergies and current crcl, Rocephin would be best choice.   Plan:  Begin Rocephin 1g IV q24h x 7 days. If patient to be discharged, could switch to ceftin 250mg  po bid to complete 7 days.  Verlene Mayer, PharmD, BCPS Pager [phone] 09/03/2012,10:49 AM

## 2012-09-03 NOTE — Progress Notes (Signed)
Patient ID: Amber Rollins, female   DOB: [birth year], 79 y.o.   MRN: [id number] Urine positive for uti on 08/31/02 lab results. For vertebroplasty in am . Pharmacy to help with uti therapy

## 2012-09-04 ENCOUNTER — Inpatient Hospital Stay (HOSPITAL_COMMUNITY): Payer: Medicare Other | Admitting: Anesthesiology

## 2012-09-04 ENCOUNTER — Encounter (HOSPITAL_COMMUNITY): Payer: Self-pay | Admitting: Anesthesiology

## 2012-09-04 ENCOUNTER — Ambulatory Visit (HOSPITAL_COMMUNITY): Admission: RE | Admit: 2012-09-04 | Payer: Medicare Other | Source: Ambulatory Visit | Admitting: Neurosurgery

## 2012-09-04 ENCOUNTER — Inpatient Hospital Stay (HOSPITAL_COMMUNITY): Payer: Medicare Other

## 2012-09-04 ENCOUNTER — Encounter (HOSPITAL_COMMUNITY)
Admission: AD | Disposition: A | Discharge status: Skilled Nursing Facility | Payer: Self-pay | Source: Ambulatory Visit | Attending: Neurosurgery

## 2012-09-04 HISTORY — PX: KYPHOPLASTY: SHX5884

## 2012-09-04 SURGERY — KYPHOPLASTY
Anesthesia: General | Wound class: Clean

## 2012-09-04 MED ORDER — NEOSTIGMINE METHYLSULFATE 1 MG/ML IJ SOLN
INTRAMUSCULAR | Status: DC | PRN
  Administered 2012-09-04: 3 mg via INTRAVENOUS

## 2012-09-04 MED ORDER — DEXAMETHASONE SODIUM PHOSPHATE 4 MG/ML IJ SOLN
INTRAMUSCULAR | Status: DC | PRN
  Administered 2012-09-04: 8 mg via INTRAVENOUS

## 2012-09-04 MED ORDER — LACTATED RINGERS IV SOLN
INTRAVENOUS | Status: DC | PRN
  Administered 2012-09-04 (×2): via INTRAVENOUS

## 2012-09-04 MED ORDER — ARTIFICIAL TEARS OP OINT
TOPICAL_OINTMENT | OPHTHALMIC | Status: DC | PRN
  Administered 2012-09-04: 1 via OPHTHALMIC

## 2012-09-04 MED ORDER — ROCURONIUM BROMIDE 100 MG/10ML IV SOLN
INTRAVENOUS | Status: DC | PRN
  Administered 2012-09-04: 20 mg via INTRAVENOUS

## 2012-09-04 MED ORDER — GLYCOPYRROLATE 0.2 MG/ML IJ SOLN
INTRAMUSCULAR | Status: DC | PRN
  Administered 2012-09-04: 0.4 mg via INTRAVENOUS

## 2012-09-04 MED ORDER — DEXTROSE 5 % IV SOLN
INTRAVENOUS | Status: DC | PRN
  Administered 2012-09-04 (×2): via INTRAVENOUS

## 2012-09-04 MED ORDER — VANCOMYCIN HCL IN DEXTROSE 1-5 GM/200ML-% IV SOLN: 1000.0000 mg | Freq: Once | INTRAVENOUS | Status: DC

## 2012-09-04 MED ORDER — PHENYLEPHRINE HCL 10 MG/ML IJ SOLN
20.0000 mg | INTRAVENOUS | Status: DC | PRN
  Administered 2012-09-04: 50 ug/min via INTRAVENOUS

## 2012-09-04 MED ORDER — LORATADINE 10 MG PO TABS: 10.0000 mg | ORAL_TABLET | Freq: Every day | ORAL | Status: DC

## 2012-09-04 MED ORDER — LIDOCAINE HCL (CARDIAC) 20 MG/ML IV SOLN
INTRAVENOUS | Status: DC | PRN
  Administered 2012-09-04: 50 mg via INTRAVENOUS

## 2012-09-04 MED ORDER — FENTANYL CITRATE 0.05 MG/ML IJ SOLN
INTRAMUSCULAR | Status: DC | PRN
  Administered 2012-09-04: 75 ug via INTRAVENOUS
  Administered 2012-09-04: 50 ug via INTRAVENOUS

## 2012-09-04 MED ORDER — ONDANSETRON HCL 4 MG/2ML IJ SOLN
INTRAMUSCULAR | Status: DC | PRN
  Administered 2012-09-04: 4 mg via INTRAVENOUS

## 2012-09-04 MED ORDER — EPHEDRINE SULFATE 50 MG/ML IJ SOLN
INTRAMUSCULAR | Status: DC | PRN
  Administered 2012-09-04: 10 mg via INTRAVENOUS
  Administered 2012-09-04: 15 mg via INTRAVENOUS

## 2012-09-04 MED ORDER — ONDANSETRON HCL 4 MG/2ML IJ SOLN: 4.0000 mg | Freq: Once | INTRAMUSCULAR | Status: DC | PRN

## 2012-09-04 MED ORDER — VANCOMYCIN HCL IN DEXTROSE 1-5 GM/200ML-% IV SOLN
1000.0000 mg | Freq: Once | INTRAVENOUS | Status: AC
  Administered 2012-09-05: 1000 mg via INTRAVENOUS
  Filled 2012-09-04: qty 200

## 2012-09-04 MED ORDER — HYDROMORPHONE HCL PF 1 MG/ML IJ SOLN: 0.2500 mg | INTRAMUSCULAR | Status: DC | PRN

## 2012-09-04 MED ORDER — CALCIUM CARBONATE-VITAMIN D 500-200 MG-UNIT PO TABS
1.0000 | ORAL_TABLET | Freq: Every day | ORAL | Status: DC
  Administered 2012-09-04 – 2012-09-14 (×11): 1 via ORAL
  Filled 2012-09-04 (×11): qty 1

## 2012-09-04 MED ORDER — IOHEXOL 300 MG/ML  SOLN
INTRAMUSCULAR | Status: DC | PRN
  Administered 2012-09-04: 300 mg

## 2012-09-04 MED ORDER — PROPOFOL 10 MG/ML IV BOLUS
INTRAVENOUS | Status: DC | PRN
  Administered 2012-09-04: 110 mg via INTRAVENOUS

## 2012-09-04 MED ORDER — LIDOCAINE-EPINEPHRINE 1 %-1:100000 IJ SOLN
INTRAMUSCULAR | Status: DC | PRN
  Administered 2012-09-04: 18 mL

## 2012-09-04 MED ORDER — 0.9 % SODIUM CHLORIDE (POUR BTL) OPTIME
TOPICAL | Status: DC | PRN
  Administered 2012-09-04: 1000 mL

## 2012-09-04 SURGICAL SUPPLY — 43 items
BANDAGE ADHESIVE 1X3 (GAUZE/BANDAGES/DRESSINGS) ×2 IMPLANT
BLADE SURG 15 STRL LF DISP TIS (BLADE) ×1 IMPLANT
BLADE SURG 15 STRL SS (BLADE) ×1
BLADE SURG ROTATE 9660 (MISCELLANEOUS) IMPLANT
CEMENT BONE KYPHX HV R (Orthopedic Implant) ×4 IMPLANT
CEMENT KYPHON C01A KIT/MIXER (Cement) ×4 IMPLANT
CLOTH BEACON ORANGE TIMEOUT ST (SAFETY) ×2 IMPLANT
CONT SPEC 4OZ CLIKSEAL STRL BL (MISCELLANEOUS) ×4 IMPLANT
DERMABOND ADVANCED (GAUZE/BANDAGES/DRESSINGS) ×1
DERMABOND ADVANCED .7 DNX12 (GAUZE/BANDAGES/DRESSINGS) ×1 IMPLANT
DRAPE C-ARM 42X72 X-RAY (DRAPES) ×2 IMPLANT
DRAPE LAPAROTOMY 100X72X124 (DRAPES) ×2 IMPLANT
DRAPE PROXIMA HALF (DRAPES) IMPLANT
DURAPREP 26ML APPLICATOR (WOUND CARE) ×2 IMPLANT
GAUZE SPONGE 4X4 16PLY XRAY LF (GAUZE/BANDAGES/DRESSINGS) ×2 IMPLANT
GLOVE BIOGEL PI IND STRL 7.5 (GLOVE) ×1 IMPLANT
GLOVE BIOGEL PI IND STRL 8 (GLOVE) ×2 IMPLANT
GLOVE BIOGEL PI INDICATOR 7.5 (GLOVE) ×1
GLOVE BIOGEL PI INDICATOR 8 (GLOVE) ×2
GLOVE ECLIPSE 7.5 STRL STRAW (GLOVE) ×2 IMPLANT
GLOVE EXAM NITRILE LRG STRL (GLOVE) IMPLANT
GLOVE EXAM NITRILE MD LF STRL (GLOVE) ×2 IMPLANT
GLOVE EXAM NITRILE XL STR (GLOVE) IMPLANT
GLOVE EXAM NITRILE XS STR PU (GLOVE) IMPLANT
GLOVE INDICATOR 8.0 STRL GRN (GLOVE) ×2 IMPLANT
GLOVE INDICATOR 8.5 STRL (GLOVE) ×4 IMPLANT
GOWN BRE IMP SLV AUR LG STRL (GOWN DISPOSABLE) ×2 IMPLANT
GOWN STRL REIN 2XL LVL4 (GOWN DISPOSABLE) IMPLANT
KIT BASIN OR (CUSTOM PROCEDURE TRAY) ×2 IMPLANT
KIT ROOM TURNOVER OR (KITS) ×2 IMPLANT
MIXER KYPHON (MISCELLANEOUS) ×2 IMPLANT
NEEDLE HYPO 25X1 1.5 SAFETY (NEEDLE) ×2 IMPLANT
NS IRRIG 1000ML POUR BTL (IV SOLUTION) ×2 IMPLANT
PACK SURGICAL SETUP 50X90 (CUSTOM PROCEDURE TRAY) ×2 IMPLANT
PAD ARMBOARD 7.5X6 YLW CONV (MISCELLANEOUS) ×6 IMPLANT
SUT VIC AB 3-0 SH 8-18 (SUTURE) ×2 IMPLANT
SYR 30ML BONE CEMENT XPNDR (MISCELLANEOUS) ×4
SYR CONTROL 10ML LL (SYRINGE) ×2 IMPLANT
SYRINGE 30ML BONE CEMENT XPNDR (MISCELLANEOUS) ×2 IMPLANT
TOWEL OR 17X24 6PK STRL BLUE (TOWEL DISPOSABLE) ×2 IMPLANT
TOWEL OR 17X26 10 PK STRL BLUE (TOWEL DISPOSABLE) ×2 IMPLANT
TRAY KYPHOPAK 15/3 ONESTEP 1ST (MISCELLANEOUS) ×6 IMPLANT
WATER STERILE IRR 1000ML POUR (IV SOLUTION) ×2 IMPLANT

## 2012-09-04 NOTE — Anesthesia Postprocedure Evaluation (Signed)
  Anesthesia Post-op Note  Patient: Amber Rollins  Procedure(s) Performed: Procedure(s) with comments: T7, T8,T12 Kyphoplasty  (N/A) - thoracic seven,eight, and twelve   Patient Location: PACU  Anesthesia Type:General  Level of Consciousness: awake, oriented, sedated and patient cooperative  Airway and Oxygen Therapy: Patient Spontanous Breathing  Post-op Pain: none  Post-op Assessment: Post-op Vital signs reviewed, Patient's Cardiovascular Status Stable, Respiratory Function Stable, Patent Airway, No signs of Nausea or vomiting and Pain level controlled  Post-op Vital Signs: stable  Complications: No apparent anesthesia complications

## 2012-09-04 NOTE — Evaluation (Signed)
Occupational Therapy Evaluation Patient Details Name: Amber Rollins MRN: [id number] DOB: 1931-05-02 Today's Date: 09/04/2012 Time: 7829-5621 OT Time Calculation (min): 48 min  OT Assessment / Plan / Recommendation Clinical Impression  Pt demos decline in function with ADLs, strength, balance, safety and activity tolerance following T 7 - 8, T 12 kyphoplasty. Pt previously hospitalized due to L rib fxs, T 12 compression fx and sternal fx due to falling through attic at home. Pt would benefit from OT services to address these impairments to help retsore PLOF    OT Assessment  Patient needs continued OT Services    Follow Up Recommendations  SNF ( pt at SNF for short term rehab prior to kyphoplasty )   Barriers to Discharge Decreased caregiver support pt was caring for husband at home who has cancer prior to fall  Equipment Recommendations  None recommended by OT    Recommendations for Other Services    Frequency  Min 2X/week    Precautions / Restrictions Precautions Precautions: Back Precaution Comments: Pt and family eduacted on back precautionsand provided with handout Spinal Brace: Thoracolumbosacral orthotic;Applied in supine position;Other (comment) (HOB at 20 degrees) Restrictions Weight Bearing Restrictions: No   Pertinent Vitals/Pain     ADL  Grooming: Performed;Wash/dry hands;Wash/dry face;Set up Where Assessed - Grooming: Supported sitting Upper Body Bathing: Simulated;Supervision/safety;Set up Where Assessed - Upper Body Bathing: Supported sitting Lower Body Bathing: +1 Total assistance;Simulated Upper Body Dressing: Performed;Set up;Supervision/safety Where Assessed - Upper Body Dressing: Unsupported sitting Lower Body Dressing: +1 Total assistance Toilet Transfer: Performed;Moderate assistance    OT Diagnosis: Generalized weakness;Acute pain  OT Problem List: Decreased strength;Decreased knowledge of precautions;Decreased activity tolerance;Decreased  safety awareness;Pain;Impaired balance (sitting and/or standing) OT Treatment Interventions: Self-care/ADL training;Therapeutic activities;Patient/family education;DME and/or AE instruction;Balance training;Therapeutic exercise;Neuromuscular education   OT Goals Acute Rehab OT Goals OT Goal Formulation: With patient/family Time For Goal Achievement: 09/11/12 Potential to Achieve Goals: Good ADL Goals Pt Will Perform Grooming: with set-up;with supervision;with min assist;Standing at sink ADL Goal: Grooming - Progress: Goal set today Pt Will Perform Lower Body Bathing: with mod assist;with adaptive equipment ADL Goal: Lower Body Bathing - Progress: Goal set today Pt Will Perform Lower Body Dressing: with mod assist;with adaptive equipment ADL Goal: Lower Body Dressing - Progress: Goal set today Pt Will Transfer to Toilet: with min assist;with DME;Grab bars;Maintaining back safety precautions ADL Goal: Toilet Transfer - Progress: Goal set today Pt Will Perform Toileting - Clothing Manipulation: with min assist;Standing ADL Goal: Toileting - Clothing Manipulation - Progress: Goal set today Pt Will Perform Toileting - Hygiene: with min assist;Sit to stand from 3-in-1/toilet ADL Goal: Toileting - Hygiene - Progress: Goal set today  Visit Information  Last OT Received On: 09/04/12 Assistance Needed: +1 PT/OT Co-Evaluation/Treatment: Yes    Subjective Data  Subjective: " I have been dizzy and feel groggy ' Patient Stated Goal: To return home after SNF rehab   Prior Functioning     Home Living Lives With: Spouse Available Help at Discharge: Family;Available PRN/intermittently Type of Home: House Home Access: Ramped entrance Home Layout: One level Bathroom Shower/Tub: Health visitor: Standard Bathroom Accessibility: Yes Home Adaptive Equipment: Bedside commode/3-in-1;Shower chair with back;Walker - rolling Prior Function Level of Independence: Independent Able to  Take Stairs?: Yes Driving: Yes Vocation: Retired Musician: No difficulties Dominant Hand: Right         Vision/Perception Vision - History Baseline Vision: Wears glasses only for reading Patient Visual Report: No change from baseline Perception Perception: Within  Functional Limits   Cognition  Cognition Overall Cognitive Status: Appears within functional limits for tasks assessed/performed Arousal/Alertness: Awake/alert Orientation Level: Appears intact for tasks assessed Behavior During Session: [facility] for tasks performed    Extremity/Trunk Assessment Right Upper Extremity Assessment RUE ROM/Strength/Tone: [facility] for tasks assessed Left Upper Extremity Assessment LUE ROM/Strength/Tone: [facility] for tasks assessed     Mobility Bed Mobility Bed Mobility: Rolling Right;Right Sidelying to Sit;Sitting - Scoot to [place] of Bed Rolling Right: 4: Min guard Right Sidelying to Sit: 4: Min assist Sitting - Scoot to [place] of Bed: 4: Min guard Details for Bed Mobility Assistance: cues for sequencing, log roll, safe technique Transfers Sit to Stand: 3: Mod assist;With upper extremity assist;From bed;From chair/3-in-1;With armrests Stand to Sit: To chair/3-in-1;With upper extremity assist;With armrests;4: Min assist     Exercise     Balance Balance Balance Assessed: Yes   End of Session OT - End of Session Equipment Utilized During Treatment: Gait belt;Back brace;Other (comment) (BSC) Activity Tolerance: Patient limited by fatigue Patient left: in bed;with call bell/phone within reach;with family/visitor present  GO     Galen Manila 09/04/2012, 3:47 PM

## 2012-09-04 NOTE — Transfer of Care (Signed)
Immediate Anesthesia Transfer of Care Note  Patient: Amber Rollins  Procedure(s) Performed: Procedure(s) with comments: T7, T8,T12 Kyphoplasty  (N/A) - thoracic seven,eight, and twelve   Patient Location: PACU  Anesthesia Type:General  Level of Consciousness: awake, alert , oriented and patient cooperative  Airway & Oxygen Therapy: Patient Spontanous Breathing and Patient connected to nasal cannula oxygen  Post-op Assessment: Report given to PACU RN and Post -op Vital signs reviewed and stable  Post vital signs: Reviewed and stable  Complications: No apparent anesthesia complications

## 2012-09-04 NOTE — Evaluation (Signed)
Physical Therapy Evaluation Patient Details Name: Amber Rollins MRN: [id number] DOB: 1930/08/10 Today's Date: 09/04/2012 Time: 1457-1530 PT Time Calculation (min): 33 min  PT Assessment / Plan / Recommendation Clinical Impression  Pt demos decline in functional mobility secondary to decreased strength, balance, safety and activity tolerance following T 7 - 8, T 12 kyphoplasty. Pt has a history of hospitalization  due to L rib fxs, T 12 compression fx and sternal fx due to falling through attic at home. Pt will benefit from continued skilled PT to address deficits; rec SNF upon discharge.    PT Assessment  Patient needs continued PT services    Follow Up Recommendations  SNF    Does the patient have the potential to tolerate intense rehabilitation      Barriers to Discharge None      Equipment Recommendations  None recommended by PT    Recommendations for Other Services     Frequency Min 3X/week    Precautions / Restrictions Precautions Precautions: Back Precaution Comments: Pt and family eduacted on back precautionsand provided with handout Spinal Brace: Thoracolumbosacral orthotic;Applied in supine position;Other (comment) (HOB at 20 degrees) Restrictions Weight Bearing Restrictions: No   Pertinent Vitals/Pain 6/10      Mobility  Bed Mobility Bed Mobility: Rolling Right;Right Sidelying to Sit;Sitting - Scoot to [place] of Bed Rolling Right: 4: Min guard Right Sidelying to Sit: 4: Min assist Sitting - Scoot to Edge of Bed: 4: Min guard Details for Bed Mobility Assistance: cues for sequencing, log roll, safe technique Transfers Transfers: Sit to Stand;Stand to Sit;Stand Pivot Transfers Sit to Stand: 3: Mod assist;With upper extremity assist;From bed;From chair/3-in-1;With armrests Stand to Sit: To chair/3-in-1;With upper extremity assist;With armrests;4: Min assist Stand Pivot Transfers: 3: Mod assist Details for Transfer Assistance: cues for hand placement with  each sit<>stand transfer. A to initate stand and to ensure balance and stability. Cues to sit slowly and not to arch back with sitting Ambulation/Gait Ambulation/Gait Assistance: Not tested (comment)    Exercises     PT Diagnosis: Difficulty walking;Acute pain  PT Problem List: Decreased strength;Decreased activity tolerance;Decreased balance;Decreased mobility;Decreased knowledge of use of DME;Decreased knowledge of precautions;Other (comment);Cardiopulmonary status limiting activity PT Treatment Interventions: DME instruction;Gait training;Functional mobility training;Therapeutic activities;Therapeutic exercise;Balance training;Patient/family education   PT Goals Acute Rehab PT Goals PT Goal Formulation: With patient Time For Goal Achievement: 08/25/12 Potential to Achieve Goals: Good Pt will Roll Supine to Right Side: with min assist PT Goal: Rolling Supine to Right Side - Progress: Goal set today Pt will Roll Supine to Left Side: with min assist PT Goal: Rolling Supine to Left Side - Progress: Goal set today Pt will go Supine/Side to Sit: with min assist PT Goal: Supine/Side to Sit - Progress: Goal set today Pt will go Sit to Supine/Side: with min assist PT Goal: Sit to Supine/Side - Progress: Goal set today Pt will go Sit to Stand: with min assist PT Goal: Sit to Stand - Progress: Goal set today Pt will go Stand to Sit: with min assist PT Goal: Stand to Sit - Progress: Goal set today Pt will Ambulate: 51 - 150 feet;with min assist;with rolling walker PT Goal: Ambulate - Progress: Goal set today  Visit Information  Last PT Received On: 09/04/12 Assistance Needed: +1    Subjective Data  Subjective: Pt required max encouragement to participate   Prior Functioning  Home Living Lives With: Spouse Available Help at Discharge: Family;Available PRN/intermittently Type of Home: House Home Access: Ramped entrance Home  Layout: One level Bathroom Shower/Tub: Architectural technologist: Standard Bathroom Accessibility: Yes Home Adaptive Equipment: Bedside commode/3-in-1;Shower chair with back;Walker - rolling Prior Function Level of Independence: Independent Able to Take Stairs?: Yes Driving: Yes Vocation: Retired Musician: No difficulties Dominant Hand: Right    Cognition  Cognition Overall Cognitive Status: Appears within functional limits for tasks assessed/performed Arousal/Alertness: Awake/alert Orientation Level: Appears intact for tasks assessed Behavior During Session: [facility] for tasks performed    Extremity/Trunk Assessment Right Upper Extremity Assessment RUE ROM/Strength/Tone: [facility] for tasks assessed Left Upper Extremity Assessment LUE ROM/Strength/Tone: [facility] for tasks assessed Right Lower Extremity Assessment RLE ROM/Strength/Tone: [facility] for tasks assessed Left Lower Extremity Assessment LLE ROM/Strength/Tone: WFL for tasks assessed   Balance Balance Balance Assessed: Yes Static Sitting Balance Static Sitting - Balance Support: Right upper extremity supported;Left upper extremity supported;Feet supported Static Sitting - Level of Assistance: 5: Stand by assistance Static Standing Balance Static Standing - Balance Support: During functional activity Static Standing - Level of Assistance: 4: Min assist Static Standing - Comment/# of Minutes: 6 minutes  End of Session PT - End of Session Equipment Utilized During Treatment: Gait belt;Back brace Activity Tolerance: Patient limited by fatigue;Patient limited by pain Patient left: in bed;with call bell/phone within reach;with family/visitor present Nurse Communication: Mobility status;Patient requests pain meds  GP     Fabio Asa 09/04/2012, 4:10 PM Charlotte Crumb, PT DPT  [phone]

## 2012-09-04 NOTE — Progress Notes (Signed)
Pt not available at this time, had gone for surgery before incoming RN took over.

## 2012-09-04 NOTE — Op Note (Signed)
08/30/2012 - 09/04/2012  9:53 AM  PATIENT:  Amber Rollins  79 y.o. female  PRE-OPERATIVE DIAGNOSIS:  T7,8,12 compression Fracture  POST-OPERATIVE DIAGNOSIS:  T7,8,12 compression Fracture  PROCEDURE:  Procedure(s): T7, T8,T12 balloon Kyphoplasty  , with methylmethacrylate and  flouroscopy  SURGEON:  Surgeon(s): Clydene Fake, MD    ANESTHESIA:   general  EBL:  Total I/O In: 1550 [I.V.:1550] Out: 5 [Blood:5]  BLOOD ADMINISTERED:none  DRAINS: none   SPECIMEN:  No Specimen  DICTATION: Patient is a 79 year old woman who fell  From her and extending T8 and T12 fractures. She was treated in a TLSO and transferred to a skilled nursing facility but she had progressive kyphosis and patient brought in for kyphoplasty of fractures she developed T7 fracture subsequently.  Patient brought into operating room general anesthesia induced patient placed in a prone position and all pressure points were padded. Patient prepped and draped in a sterile fashion. AP and lateral fluoroscopic imaging was used to find the entry point the left T8 pedicle and vertebral body. A stab incision was made after injecting the area with 7 cc 1% lidocaine with epinephrine. Needle was then placed down to the lateral pedicle of T8 and placed down into the vertebral body using fluoroscopic imaging. Used a drill to drill the vertebral body placed kyphoplasty balloon and slightly inflated the balloon.  Attention was then to the right side at C7 same technique was used to placed a probe into the the pedicle vertebral body of T7 then used to drill to drill the vertebral and we placed kyphoplasty balloon. the balloons were inflated during under fluoroscopy and then with methylmethacrylate was injected in the vertebral bodies were watching the fluoroscopy. We did get some reduction of the fracture T7-T8 also. Remove the working port cystic final AP and lateral fluoroscopic imaging showed good kyphoplasty at these 2  levels.  Within the center the fluoroscopy over T12 on the left side placed a small stab incision placed over probe into the vertebral body any subdural fluid as mentioned at the other levels same technique and injected methylmethacrylate into the T12 vertebral body remove the bony working port and a final AP lateral fluoroscopic images showing good kyphoplasty T12. 3 stab incisions were then closed with a 3o Vicryl subcuticular suture and the skin was closed with glue and a strap placed over the incisions patient was woken from anesthesia transferred recovery   PLAN OF CARE: Admit to inpatient   PATIENT DISPOSITION:  PACU - hemodynamically stable.

## 2012-09-04 NOTE — Anesthesia Procedure Notes (Signed)
Procedure Name: Intubation Date/Time: 09/04/2012 7:52 AM Performed by: Tyrone Nine Pre-anesthesia Checklist: Patient identified, Timeout performed, Emergency Drugs available, Suction available and Patient being monitored Patient Re-evaluated:Patient Re-evaluated prior to inductionOxygen Delivery Method: Circle system utilized Preoxygenation: Pre-oxygenation with 100% oxygen Intubation Type: IV induction Ventilation: Mask ventilation without difficulty Laryngoscope Size: Mac and 3 Grade View: Grade IV Tube type: Oral Tube size: 7.5 mm Number of attempts: 2 Airway Equipment and Method: Bougie stylet Placement Confirmation: ETT inserted through vocal cords under direct vision,  positive ETCO2 and breath sounds checked- equal and bilateral Secured at: 21 cm Tube secured with: Tape Dental Injury: Teeth and Oropharynx as per pre-operative assessment and Injury to lip  Difficulty Due To: Difficult Airway- due to anterior larynx Future Recommendations: Recommend- induction with short-acting agent, and alternative techniques readily available Comments: Grade lV view w/ cricoid manipulation.  2nd look w/ small foam @ occiput Grade lll view; Bougie easily placed thru v.cords.  VSS throughout  SaO2 99-100%.  Arcelia Jew CRNA

## 2012-09-04 NOTE — Interval H&P Note (Signed)
History and Physical Interval Note:  09/04/2012 7:34 AM  Amber Rollins  has presented today for surgery, with the diagnosis of T8 Burst Fracture  The various methods of treatment have been discussed with the patient and family. After consideration of risks, benefits and other options for treatment, the patient has consented to  Procedure(s): T7, T8,T12 Kyphoplasty  (N/A) as a surgical intervention .  The patient's history has been reviewed, patient examined, no change in status, stable for surgery.  I have reviewed the patient's chart and labs.  Questions were answered to the patient's satisfaction.     [name]

## 2012-09-04 NOTE — Plan of Care (Signed)
Problem: Phase II Progression Outcomes Goal: Discharge plan established Recommend SNF for further therapy needs after acute care d/c     

## 2012-09-04 NOTE — Clinical Social Work Note (Signed)
Clinical Social Work   CSW met with pt's daughter to address SNF placement, as pt has been to SNF in the past. Pt's daughter would prefer to have to pt go to CIR. CSW provided a list of SNFs to pt's daughter. CSW also explained process of SNF placement with [facility] insurance.   CSW reviewed chart and discussed pt during progression. Per chart review, pt underwent surgery today. Awaiting PT/OT evals for discharge recommendations. PT/OT evals are needed for prior authorization for SNF placement. CSW will continue to follow for discharge planning needs.   Dede Query, MSW, LCSW [phone]

## 2012-09-04 NOTE — Preoperative (Signed)
Beta Blockers   Reason not to administer Beta Blockers:Not Applicable 

## 2012-09-04 NOTE — Progress Notes (Signed)
Thank you for consult on Amber Rollins. Chart reviewed and note that patient underwent kyphoplasty of T7. She has been at [facility] for the past few weeks. Will follow up to complete formal evaluation once therapies initiated.

## 2012-09-04 NOTE — Anesthesia Preprocedure Evaluation (Addendum)
Anesthesia Evaluation  Patient identified by MRN, date of birth, ID band Patient awake    Reviewed: Allergy & Precautions, H&P , NPO status , Patient's Chart, lab work & pertinent test results  History of Anesthesia Complications (+) PONV  Airway Mallampati: II TM Distance: >3 FB Neck ROM: full    Dental  (+) Teeth Intact and Dental Advisory Given,    Pulmonary neg pulmonary ROS,  09-03-12 Chest X-Ray Cardiomegaly.   Calcified tortuous aorta.   IMPRESSION: Subsegmental atelectatic changes.   No segmental consolidation   Pulmonary exam normal       Cardiovascular Exercise Tolerance: Poor hypertension, Pt. on medications Rhythm:regular Rate:Normal  10-02-10 EKG  NSR cannot R/O Anterior MI   Neuro/Psych PSYCHIATRIC DISORDERS Anxiety negative neurological ROS     GI/Hepatic Neg liver ROS, GERD-  Medicated and Controlled,  Endo/Other  negative endocrine ROS  Renal/GU Renal InsufficiencyRenal disease     Musculoskeletal  (+) Arthritis -, Osteoarthritis,    Abdominal Normal abdominal exam  (+)   Peds  Hematology negative hematology ROS (+)   Anesthesia Other Findings Bridge upper dentition  Pt sustained loss of balance in her attic 5 days ago, and fell thru ceiling below.  No LOC.  Reproductive/Obstetrics                      Anesthesia Physical Anesthesia Plan  ASA: II  Anesthesia Plan: General   Post-op Pain Management:    Induction: Intravenous  Airway Management Planned: Oral ETT  Additional Equipment:   Intra-op Plan:   Post-operative Plan: Extubation in OR  Informed Consent: I have reviewed the patients History and Physical, chart, labs and discussed the procedure including the risks, benefits and alternatives for the proposed anesthesia with the patient or authorized representative who has indicated his/her understanding and acceptance.   Dental advisory given  Plan  Discussed with: CRNA, Anesthesiologist and Surgeon  Anesthesia Plan Comments:        Anesthesia Quick Evaluation

## 2012-09-05 ENCOUNTER — Inpatient Hospital Stay (HOSPITAL_COMMUNITY): Payer: Medicare Other

## 2012-09-05 DIAGNOSIS — W19XXXA Unspecified fall, initial encounter: Secondary | ICD-10-CM

## 2012-09-05 DIAGNOSIS — S22009A Unspecified fracture of unspecified thoracic vertebra, initial encounter for closed fracture: Secondary | ICD-10-CM

## 2012-09-05 MED ORDER — MENTHOL 3 MG MT LOZG
1.0000 | LOZENGE | OROMUCOSAL | Status: DC | PRN
  Administered 2012-09-05: 3 mg via ORAL
  Filled 2012-09-05: qty 9

## 2012-09-05 NOTE — Progress Notes (Signed)
Patient brace applied while lying, then patient abulated with walker and 1 asst to chair. Sat in chair for approx 45 min, then back to bed with RN help. Tolerated very well.  Minor, Yvette Rack

## 2012-09-05 NOTE — Clinical Social Work Placement (Addendum)
Clinical Social Work Department CLINICAL SOCIAL WORK PLACEMENT NOTE 09/05/2012  Patient:  Amber Rollins, Amber Rollins  Account Number:  [id number] Admit date:  08/30/2012  Clinical Social Worker:  Peggyann Shoals  Date/time:  09/05/2012 01:53 PM  Clinical Social Work is seeking post-discharge placement for this patient at the following level of care:   SKILLED NURSING   (*CSW will update this form in Epic as items are completed)   09/05/2012  Patient/family provided with [facility] Department of Clinical Social Work's list of facilities offering this level of care within the geographic area requested by the patient (or if unable, by the patient's family).  09/05/2012  Patient/family informed of their freedom to choose among providers that offer the needed level of care, that participate in Medicare, Medicaid or managed care program needed by the patient, have an available bed and are willing to accept the patient.  09/05/2012  Patient/family informed of [facility]' ownership interest in [facility], as well as of the fact that they are under no obligation to receive care at this facility.  PASARR submitted to [employer] on Existing PASARR# PASARR number received from [employer] on   FL2 transmitted to all facilities in geographic area requested by pt/family on  09/05/2012 FL2 transmitted to all facilities within larger geographic area on 09/06/2012  Patient informed that his/her managed care company has contracts with or will negotiate with  certain facilities, including the following:     Patient/family informed of bed offers received:  09/06/2012 Patient chooses bed at [facility] Physician recommends and patient chooses bed at  [facility]  Patient to be transferred to [facility] on 09/14/2012 Patient to be transferred to facility by [facility]  The following physician request were entered in Epic:   Additional Comments: 09/14/2012:  [employer] Medicare Berkley Harvey has been obtained.   Dede Query, MSW,  LCSW [phone]

## 2012-09-05 NOTE — Progress Notes (Signed)
Doing well. C/o appropriate incisional soreness. No back pain No Numbness, tingling, weakness No Nausea /vomiting Amb/ voiding well  Temp:  [97.9 F (36.6 C)-98.6 F (37 C)] 98 F (36.7 C) (04/08 0513) Pulse Rate:  [72-92] 92 (04/08 0513) Resp:  [16-18] 16 (04/08 0513) BP: (121-138)/(47-63) 135/59 mmHg (04/08 0513) SpO2:  [92 %-99 %] 93 % (04/08 0513) Good strength and sensation Incision CDI  Plan: CPM  , placement

## 2012-09-05 NOTE — Progress Notes (Signed)
Physical Therapy Treatment Patient Details Name: Amber Rollins MRN: [id number] DOB: January 08, 1931 Today's Date: 09/05/2012 Time: 4098-1191 PT Time Calculation (min): 26 min  PT Assessment / Plan / Recommendation Comments on Treatment Session  pt s/p kyphoplasty s/p fall from attic with multiple thoracic fractures; pt reported she had pain medicine about 45 minutes prior to session (?if meds impacting her memory); slowly progressing with therapy    Follow Up Recommendations  SNF     Does the patient have the potential to tolerate intense rehabilitation     Barriers to Discharge        Equipment Recommendations  None recommended by PT    Recommendations for Other Services    Frequency Min 3X/week   Plan Discharge plan remains appropriate;Frequency remains appropriate    Precautions / Restrictions Precautions Precautions: Back Precaution Comments: Pt educated on back precautions, "i've never heard that before" Required Braces or Orthoses: Spinal Brace Spinal Brace: Thoracolumbosacral orthotic;Applied in supine position;Other (comment) (HOB at 20 degrees)   Pertinent Vitals/Pain Back pain 5-6/10 in bed, "better now" when walking    Mobility  Bed Mobility Bed Mobility: Rolling Right;Right Sidelying to Sit;Sitting - Scoot to Edge of Bed Rolling Right: 4: Min guard Rolling Left: 4: Min guard;With rail Right Sidelying to Sit: 4: Min assist;HOB flat;With rails Sitting - Scoot to Edge of Bed: 4: Min guard Details for Bed Mobility Assistance: cues for sequencing, log roll, safe technique Transfers Transfers: Sit to Stand;Stand to Sit;Stand Pivot Transfers Sit to Stand: With upper extremity assist;From bed;With armrests;4: Min assist;From toilet Stand to Sit: To chair/3-in-1;With upper extremity assist;With armrests;4: Min assist;To toilet Details for Transfer Assistance: cues for hand placement with each sit<>stand transfer. A to initate stand and to ensure balance and  stability. Cues to sit slowly and not to arch back with sitting Ambulation/Gait Ambulation/Gait Assistance: 4: Min assist Ambulation Distance (Feet): 130 Feet Assistive device: Rolling walker Ambulation/Gait Assistance Details: assist to maneuver RW with turns (pt tends to push too far ahead and make very wide turns) Gait Pattern: Step-through pattern;Decreased stride length;Trunk flexed Gait velocity: decreased    Exercises     PT Diagnosis:    PT Problem List:   PT Treatment Interventions:     PT Goals Acute Rehab PT Goals PT Goal Formulation: With patient Time For Goal Achievement: 08/25/12 Potential to Achieve Goals: Good Pt will Roll Supine to Right Side: with min assist PT Goal: Rolling Supine to Right Side - Progress: Progressing toward goal Pt will Roll Supine to Left Side: with min assist PT Goal: Rolling Supine to Left Side - Progress: Progressing toward goal Pt will go Supine/Side to Sit: with min assist PT Goal: Supine/Side to Sit - Progress: Progressing toward goal Pt will go Sit to Supine/Side: with min assist Pt will go Sit to Stand: with min assist PT Goal: Sit to Stand - Progress: Progressing toward goal Pt will go Stand to Sit: with min assist PT Goal: Stand to Sit - Progress: Progressing toward goal Pt will Ambulate: 51 - 150 feet;with min assist;with rolling walker PT Goal: Ambulate - Progress: Progressing toward goal Additional Goals Additional Goal #1: Pt will verbalize and adhere to back precautions when mobilizing PT Goal: Additional Goal #1 - Progress: Goal set today  Visit Information  Last PT Received On: 09/05/12 Assistance Needed: +1    Subjective Data      Cognition  Cognition Overall Cognitive Status: Impaired Area of Impairment: Memory Arousal/Alertness: Awake/alert Orientation Level: Appears intact for  tasks assessed Behavior During Session: [facility] for tasks performed Memory: Decreased recall of precautions Memory Deficits: did not  recall being taught back precautions    Balance     End of Session PT - End of Session Equipment Utilized During Treatment: Gait belt;Back brace Activity Tolerance: Patient tolerated treatment well Patient left: with call bell/phone within reach;in chair   GP     Vita Currin 09/05/2012, 5:03 PM  09/05/2012 Veda Canning, PT Pager: [phone]

## 2012-09-05 NOTE — Clinical Social Work Psychosocial (Signed)
Clinical Social Work Department BRIEF PSYCHOSOCIAL ASSESSMENT 09/05/2012  Patient:  Amber Rollins, Amber Rollins     Account Number:  [id number]     Admit date:  08/30/2012  Clinical Social Worker:  Peggyann Shoals  Date/Time:  09/05/2012 01:39 PM  Referred by:  RN  Date Referred:  09/05/2012 Referred for  SNF Placement   Other Referral:   Interview type:  Patient Other interview type:    PSYCHOSOCIAL DATA Living Status:  HUSBAND Admitted from facility:   Level of care:   Primary support name:  Lorriane Shire Primary support relationship to patient:  CHILD, ADULT Degree of support available:   very supportive.    CURRENT CONCERNS Current Concerns  Post-Acute Placement   Other Concerns:    SOCIAL WORK ASSESSMENT / PLAN CSW met with pt and daughter to address consult. CSW introduced herself and explained role of social work. CSW also explained the process of discharging to SNF with [facility] as primary insurance.    Pt lives with husband and was recently discharged from [facility]. Pt and daughter do not want to pt to return to [facility] and would like another facility. Pt expressed an interest in CIR, however PT/OT are recommending SNF.    CSW will follow up with CIR regarding possible admission, per pt request. CSW will initiate SNF search for selected facilities and will follow up with bed offers. CSW will continue to follow.   Assessment/plan status:  Psychosocial Support/Ongoing Assessment of Needs Other assessment/ plan:   Information/referral to community resources:   SNF list    PATIENT'S/FAMILY'S RESPONSE TO PLAN OF CARE: Pt is alert and oriented. Pt is agreeable to SNF, if CIR is not an option.   Dede Query, MSW, LCSW [phone]

## 2012-09-05 NOTE — Consult Note (Signed)
Physical Medicine and Rehabilitation Consult  Reason for Consult: Polytrauma Referring Physician:  Dr. Phoebe Perch   HPI: Amber Rollins is a 79 y.o. female with history of HTN, osteopenia, recent fall 08/08/12 with rib fractures as well as T8 burst fracture and T 12 compression fracture treated with bracing. She was discharged to SNF due to decrease caregiver support and challenges handling TLSO. She had worsening of kyphosis T8 and was admitted 08/30/12 for bed rest and surgical intervention. E. Coli UTI treated with rocephin. On 04/07, patient underwent T7, T8, T12 kyphoplasty by Dr. Phoebe Perch. PT/OT evaluations done yesterday and patient requiring max encouragement for participation. Family requesting CIR.    Review of Systems  Respiratory: Negative for cough and shortness of breath.   Cardiovascular: Negative for chest pain and palpitations.  Gastrointestinal: Negative for heartburn and nausea.  Genitourinary: Negative for urgency and frequency.  Musculoskeletal: Positive for back pain.   Past Medical History  Diagnosis Date  . Hyperlipemia 05/31/1992  . Hypertension 05/31/2000  . Osteopenia 11/29/1999  . Osteoarthritis   . Pedal edema     worsened by Norvasc  . Chronic foot pain     mortons neuroma  . Plantar fasciitis   . Menopausal symptoms     vasomotor symptoms-severe  . Cystocele 01/01    neg. sx  . Hemorrhoids   . fracture fibula ? 2003    left  . Complication of anesthesia   . PONV (postoperative nausea and vomiting)   . GERD (gastroesophageal reflux disease)    Past Surgical History  Procedure Laterality Date  . Partial hysterectomy  1970    endometriosis  . Foot problems      neuromas  . Anterior repari w//surg proc  06/30/99    RSO  . History of ct      mass right adnexa, U?S by GYN-observation  . Bladder tack    . Bladder suspension    . Cataracts     Family History  Problem Relation Age of Onset  . Diabetes Mother   . Heart disease Mother      pacemaker  . Hypertension Mother   . COPD Father   . Diabetes Sister   . Diabetes Brother   . Mental illness Son     anxiety-mental health problems  . Diabetes Brother   . Diabetes Sister   . Diabetes Sister   . Hypertension Other    Social History:  Married. Independent prior to 3/11 and was taking care of husband with cancer.  Per reports that she has never smoked. She has never used smokeless tobacco. She reports that she does not drink alcohol or use illicit drugs.   Allergies  Allergen Reactions  . Tdap (Diphth-Acell Pertussis-Tetanus) Swelling  . Amlodipine Besylate     REACTION: edema  . Codeine     headache  . Lovastatin     REACTION: leg pain  . Penicillins     Unknown reaction  . Sertraline Hcl     REACTION: caused insomnia  . Simvastatin     REACTION: non tolerant- leg pain  . Latex Rash  . Sulfonamide Derivatives Rash   Medications Prior to Admission  Medication Sig Dispense Refill  . calcium-vitamin D (OSCAL WITH D) 500-200 MG-UNIT per tablet Take 1 tablet by mouth daily.      . fexofenadine (ALLEGRA) 180 MG tablet Take 180 mg by mouth daily as needed (allergies).      . fluticasone (FLONASE) 50 MCG/ACT nasal spray 1-2 sprays  each nostril daily.      . hydrochlorothiazide (HYDRODIURIL) 25 MG tablet Take 50 mg by mouth every morning.      Marland Kitchen HYDROcodone-acetaminophen (NORCO/VICODIN) 5-325 MG per tablet Take 1-2 tablets by mouth every 4 (four) hours as needed for pain (pt can take up to 2 tabs for moderate to severe pain).      . Multiple Vitamins-Minerals (CENTRUM CARDIO) TABS Take 1 tablet by mouth daily.      . naproxen (NAPROSYN) 500 MG tablet Take 1 tablet (500 mg total) by mouth 2 (two) times daily with a meal.      . Omega-3 Fatty Acids (FISH OIL) 1200 MG CAPS Take 2 by mouth daily       . omeprazole (PRILOSEC) 20 MG capsule Take 1 capsule (20 mg total) by mouth 2 (two) times daily.  180 capsule  3  . PARoxetine (PAXIL) 10 MG tablet Take 10 mg by mouth  every morning.      . potassium chloride (K-DUR,KLOR-CON) 10 MEQ tablet Take 10 mEq by mouth daily.      . traMADol (ULTRAM) 50 MG tablet Take 2 tablets (100 mg total) by mouth every 6 (six) hours.        Home: Home Living Lives With: Spouse Available Help at Discharge: Family;Available PRN/intermittently Type of Home: House Home Access: Ramped entrance Home Layout: One level Bathroom Shower/Tub: Health visitor: Standard Bathroom Accessibility: Yes Home Adaptive Equipment: Bedside commode/3-in-1;Shower chair with back;Walker - rolling  Functional History: Prior Function Able to Take Stairs?: Yes Driving: Yes Vocation: Retired Functional Status:  Mobility: Bed Mobility Bed Mobility: Rolling Right;Right Sidelying to Sit;Sitting - Scoot to Edge of Bed Rolling Right: 4: Min guard Right Sidelying to Sit: 4: Min assist Sitting - Scoot to Edge of Bed: 4: Min guard Transfers Transfers: Sit to Stand;Stand to [employer] Transfers Sit to Stand: 3: Mod assist;With upper extremity assist;From bed;From chair/3-in-1;With armrests Stand to Sit: To chair/3-in-1;With upper extremity assist;With armrests;4: Min assist Stand Pivot Transfers: 3: Mod assist Ambulation/Gait Ambulation/Gait Assistance: Not tested (comment)    ADL: ADL Grooming: Performed;Wash/dry hands;Wash/dry face;Set up Where Assessed - Grooming: Supported sitting Upper Body Bathing: Simulated;Supervision/safety;Set up Where Assessed - Upper Body Bathing: Supported sitting Lower Body Bathing: +1 Total assistance;Simulated Upper Body Dressing: Performed;Set up;Supervision/safety Where Assessed - Upper Body Dressing: Unsupported sitting Lower Body Dressing: +1 Total assistance Toilet Transfer: Performed;Moderate assistance  Cognition: Cognition Arousal/Alertness: Awake/alert Orientation Level: Oriented X4 Cognition Overall Cognitive Status: Appears within functional limits for tasks  assessed/performed Arousal/Alertness: Awake/alert Orientation Level: Appears intact for tasks assessed Behavior During Session: [facility] for tasks performed  Blood pressure 135/59, pulse 92, temperature 98 F (36.7 C), temperature source Oral, resp. rate 16, height 5\' 3"  (1.6 m), weight 59.421 kg (131 lb), SpO2 93.00%.  Physical Exam  Nursing note and vitals reviewed. Constitutional: She is oriented to person, place, and time. She appears well-developed and well-nourished.  TLSO in place.   HENT:  Head: Normocephalic and atraumatic.  Eyes: Pupils are equal, round, and reactive to light.  Neck: Normal range of motion.  Cardiovascular: Normal rate and regular rhythm.   Pulmonary/Chest: Effort normal and breath sounds normal.  Abdominal: Soft. Bowel sounds are normal.  Musculoskeletal: She exhibits no edema and no tenderness.  Neurological: She is alert and oriented to person, place, and time. She has normal reflexes. She displays normal reflexes. No cranial nerve deficit. Coordination normal.  Pt appropriate. UE grossly 4+/5. LE 4- with HF and 4+  to 5/5 distally. No sensory deficits.  Psychiatric: She has a normal mood and affect. Her behavior is normal. Judgment and thought content normal.   No results found for this or any previous visit (from the past 24 hour(s)). Dg Thoracic Spine 2 View  09/04/2012  *RADIOLOGY REPORT*  Clinical Data: T7, T8 and T12 kyphoplasty  THORACIC SPINE - 2 VIEW  Comparison: 09/03/2012  Findings: Multiple C-arm images show three level vertebral augmentation, reportedly T7, T8 and T12.  Venous intravasation is noted at the T7 level.  IMPRESSION: Vertebral augmentation at T7, T8 and T12.   Original Report Authenticated By: Paulina Fusi, M.D.    Dg Chest Port 1 View  09/03/2012  *RADIOLOGY REPORT*  Clinical Data: Preop back surgery.  PORTABLE CHEST - 1 VIEW  Comparison: 08/10/2012.  Findings: Thoracic compression deformities with kyphosis better delineated on prior CT.   Elevated hemidiaphragms with subsegmental atelectatic changes right perihilar region and left base.  No gross pneumothorax or segmental consolidation.  Evaluation of the lung bases slightly limited by the elevated hemidiaphragms.  Cardiomegaly.  Calcified tortuous aorta.  IMPRESSION: Subsegmental atelectatic changes.  No segmental consolidation, pulmonary edema or gross pneumothorax.  Cardiomegaly.  Calcified tortuous aorta.   Original Report Authenticated By: Lacy Duverney, M.D.     Assessment/Plan: Diagnosis: T8 and T12 compression fx's after fall 1. Does the need for close, 24 hr/day medical supervision in concert with the patient's rehab needs make it unreasonable for this patient to be served in a less intensive setting? No 2. Co-Morbidities requiring supervision/potential complications: see above 3. Due to bladder management, bowel management, safety, skin/wound care, disease management, medication administration, pain management and patient education, does the patient require 24 hr/day rehab nursing? No 4. Does the patient require coordinated care of a physician, rehab nurse, PT,OT to address physical and functional deficits in the context of the above medical diagnosis(es)? No Addressing deficits in the following areas: balance, endurance, locomotion, strength, transferring, bowel/bladder control, bathing and dressing 5. Can the patient actively participate in an intensive therapy program of at least 3 hrs of therapy per day at least 5 days per week? Potentially 6. The potential for patient to make measurable gains while on inpatient rehab is fair 7. Anticipated functional outcomes upon discharge from inpatient rehab are n/a with PT, n/a with OT, n/a with SLP. 8. Estimated rehab length of stay to reach the above functional goals is: n/a 9. Does the patient have adequate social supports to accommodate these discharge functional goals? Yes 10. Anticipated D/C setting: Home  ultimately 11. Anticipated post D/C treatments: HH therapy 12. Overall Rehab/Functional Prognosis: excellent  RECOMMENDATIONS: This patient's condition is appropriate for continued rehabilitative care in the following setting: SNF Patient has agreed to participate in recommended program. No Note that insurance prior authorization may be required for reimbursement for recommended care.  Comment: Pt is already at a min assist level after 4/7 kyphoplasties. Lacks medical need for CIR.   Ranelle Oyster, MD, [place]     09/05/2012

## 2012-09-05 NOTE — Progress Notes (Signed)
Patient not a candidate to admit to inpt rehab. SNF recommended. [phone]

## 2012-09-06 ENCOUNTER — Encounter (HOSPITAL_COMMUNITY): Payer: Self-pay | Admitting: Neurosurgery

## 2012-09-06 NOTE — Progress Notes (Signed)
Physical Therapy Treatment Patient Details Name: Amber Rollins MRN: [id number] DOB: 1930-09-22 Today's Date: 09/06/2012 Time: 8469-6295 PT Time Calculation (min): 24 min  PT Assessment / Plan / Recommendation Comments on Treatment Session  Pt progressing with mobility but continues to fatigue quickly. Appetite has been poor and therapist wonders if this is factoring in to her lack of energy and quick fatigue. Continue to recommend SNF, PT to follow.    Follow Up Recommendations  SNF     Does the patient have the potential to tolerate intense rehabilitation     Barriers to Discharge        Equipment Recommendations  None recommended by PT    Recommendations for Other Services    Frequency Min 3X/week   Plan Discharge plan remains appropriate;Frequency remains appropriate    Precautions / Restrictions Precautions Precautions: Back Precaution Comments: discussed use of brace Required Braces or Orthoses: Spinal Brace Spinal Brace: Applied in supine position;Thoracolumbosacral orthotic (HOB at 20 degrees) Restrictions Weight Bearing Restrictions: No   Pertinent Vitals/Pain She says no real pain, premedicated    Mobility  Bed Mobility Bed Mobility: Not assessed Transfers Transfers: Sit to Stand;Stand to Sit Sit to Stand: 4: Min guard;4: Min assist;From chair/3-in-1;With upper extremity assist Stand to Sit: 4: Min guard;To chair/3-in-1;With upper extremity assist Details for Transfer Assistance: pt needed min A for first stand but was able to stand without physical assist (min-guard) the second time Ambulation/Gait Ambulation/Gait Assistance: 4: Min guard Ambulation Distance (Feet): 135 Feet Assistive device: Rolling walker Ambulation/Gait Assistance Details: pt remembered to stay close to RW, vc's to look ahead and not down at floor. Pt begins to feel weak and becomes slightly unsteady with increased distance Gait Pattern: Step-through pattern;Decreased stride  length;Trunk flexed Gait velocity: decreased Stairs: No Wheelchair Mobility Wheelchair Mobility: No    Exercises     PT Diagnosis:    PT Problem List:   PT Treatment Interventions:     PT Goals Acute Rehab PT Goals PT Goal Formulation: With patient Time For Goal Achievement: 08/25/12 Potential to Achieve Goals: Good Pt will Roll Supine to Right Side: with min assist PT Goal: Rolling Supine to Right Side - Progress: Progressing toward goal Pt will Roll Supine to Left Side: with min assist PT Goal: Rolling Supine to Left Side - Progress: Progressing toward goal Pt will go Supine/Side to Sit: with min assist PT Goal: Supine/Side to Sit - Progress: Progressing toward goal Pt will go Sit to Supine/Side: with min assist PT Goal: Sit to Supine/Side - Progress: Progressing toward goal Pt will go Sit to Stand: with supervision PT Goal: Sit to Stand - Progress: Updated due to goal met Pt will go Stand to Sit: with supervision PT Goal: Stand to Sit - Progress: Updated due to goals met Pt will Ambulate: >150 feet;with supervision;with rolling walker PT Goal: Ambulate - Progress: Updated due to goal met Additional Goals Additional Goal #1: Pt will verbalize and adhere to back precautions when mobilizing PT Goal: Additional Goal #1 - Progress: Progressing toward goal  Visit Information  Last PT Received On: 09/06/12 Assistance Needed: +1    Subjective Data  Subjective: pt reports she has had no appetite Patient Stated Goal: Home with Husband   Cognition  Cognition Overall Cognitive Status: Impaired Area of Impairment: Memory Arousal/Alertness: Awake/alert Orientation Level: Appears intact for tasks assessed Behavior During Session: [facility] for tasks performed Memory Deficits: pt could not tell me where she had gone for rehab last time  Balance     End of Session PT - End of Session Equipment Utilized During Treatment: Gait belt;Back brace Activity Tolerance: Patient tolerated  treatment well Patient left: with call bell/phone within reach;in chair Nurse Communication: Mobility status   GP   Lyanne Co, PT  Acute Rehab Services  [phone]   Pecola Leisure, [place] 09/06/2012, 1:40 PM

## 2012-09-06 NOTE — Progress Notes (Signed)
Occupational Therapy Treatment Patient Details Name: Amber Rollins MRN: [id number] DOB: [birth year] Today's Date: 09/06/2012 Time: 4098-1191 OT Time Calculation (min): 32 min  OT Assessment / Plan / Recommendation Comments on Treatment Session Pt making progress and should continue with OT services to maximize level of function and safety with ADLs and functional mobility    Follow Up Recommendations  SNF    Barriers to Discharge   pt's husband unable to provide adequate assist and care at home, pt caring for husband    Equipment Recommendations  None recommended by OT    Recommendations for Other Services    Frequency Min 2X/week   Plan Discharge plan remains appropriate    Precautions / Restrictions Precautions Precautions: Back Precaution Comments: pt able to recall 3/3 back precautions Required Braces or Orthoses: Spinal Brace Spinal Brace: Applied in supine position;Thoracolumbosacral orthotic;Other (comment) (HOB at 20 degrees) Restrictions Weight Bearing Restrictions: No   Pertinent Vitals/Pain 4/10    ADL  Grooming: Performed;Wash/dry hands;Wash/dry face;Min guard Where Assessed - Grooming: Supported standing Lower Body Bathing: Maximal assistance Where Assessed - Lower Body Bathing: Supported sitting Toilet Transfer: Performed;Minimal Dentist Method: Sit to Barista: Raised toilet seat with arms (or 3-in-1 over toilet) Toileting - Clothing Manipulation and Hygiene: Minimal assistance Where Assessed - Glass blower/designer Manipulation and Hygiene: Standing Transfers/Ambulation Related to ADLs: Pt require verbal cues for correct hand placement using RW and 3 in 1 over toilet, pt had LOB x 1 due to turning too quickly from sink to go back to toilet ADL Comments: pt required increased time to complete toileting due to having to have a BM after allready urinating at toilet then going to sink to wash hands    OT Diagnosis:     OT Problem List:   OT Treatment Interventions:     OT Goals ADL Goals ADL Goal: Grooming - Progress: Progressing toward goals ADL Goal: Lower Body Bathing - Progress: Progressing toward goals ADL Goal: Toilet Transfer - Progress: Progressing toward goals ADL Goal: Toileting - Clothing Manipulation - Progress: Progressing toward goals ADL Goal: Toileting - Hygiene - Progress: Progressing toward goals  Visit Information  Last OT Received On: 09/06/12 Assistance Needed: +1    Subjective Data  Subjective: " I may need to go to the bathroom since I've had this stool softner " Patient Stated Goal: To return home after rehab at SNF   Prior Functioning       Cognition  Cognition Overall Cognitive Status: Impaired Area of Impairment: Memory Arousal/Alertness: Awake/alert Orientation Level: Appears intact for tasks assessed Behavior During Session: [facility] for tasks performed Memory Deficits: pt could not tell me where she had gone for rehab last time    Mobility  Bed Mobility Bed Mobility: Not assessed Rolling Right: 4: Min guard Right Sidelying to Sit: 4: Min assist;HOB flat;With rails Sitting - Scoot to Edge of Bed: 4: Min guard Details for Bed Mobility Assistance: cues for sequencing, log roll, safe technique Transfers Transfers: Sit to Stand;Stand to Sit Sit to Stand: 4: Min guard;With upper extremity assist;From bed;From chair/3-in-1;From toilet Stand to Sit: 4: Min guard;To chair/3-in-1;With upper extremity assist;To bed;To toilet    Exercises      Balance  LOB x 1 due to turning too quickly with RW from sink back to toilet   End of Session OT - End of Session Equipment Utilized During Treatment: Gait belt;Back brace;Other (comment) (3 in 1, RW) Patient left: in bed;with call bell/phone within reach;with  family/visitor present  GO     Galen Manila 09/06/2012, 3:18 PM

## 2012-09-06 NOTE — Progress Notes (Signed)
Doing well. No back pain No Numbness, tingling, weakness No Nausea /vomiting Starting to Amb  Temp:  [97.6 F (36.4 C)-98.3 F (36.8 C)] 98.1 F (36.7 C) (04/09 0522) Pulse Rate:  [68-74] 74 (04/09 0522) Resp:  [16-18] 16 (04/09 0522) BP: (135-165)/(51-68) 136/68 mmHg (04/09 0522) SpO2:  [92 %-99 %] 94 % (04/09 0522) Good strength and sensation Incision CDI  Plan: Increase activity  - SNF  placement

## 2012-09-06 NOTE — Clinical Social Work Note (Signed)
Clinical Social Work   CSW met with pt and family to provide bed offers. Pt is thinking about which bed she would like. Pt expressed an interest in [facility], but it is a semi-private room. With pt's permission, CSW will expand SNF search to inquire about bed availability at [facility]. CSW encouraged pt to make a choice of facility, as pt is nearing discharge. CSW will follow up with pt on bed choice.   Dede Query, MSW, LCSW [phone]

## 2012-09-07 NOTE — Progress Notes (Signed)
Pt complaining of diarrhea since 4/9. Per protocol pt placed on enteric contact precautions. MD notified.

## 2012-09-07 NOTE — Progress Notes (Signed)
Occupational Therapy Treatment Patient Details Name: Amber Rollins MRN: [id number] DOB: 10-03-1930 Today's Date: 09/07/2012 Time: 2952-8413 OT Time Calculation (min): 25 min  OT Assessment / Plan / Recommendation Comments on Treatment Session Pt progressing steadily in all mobility and recall and generalization of back precautions.    Follow Up Recommendations  SNF    Barriers to Discharge       Equipment Recommendations  None recommended by OT    Recommendations for Other Services    Frequency Min 2X/week   Plan Discharge plan remains appropriate    Precautions / Restrictions Precautions Precautions: Back;Fall Precaution Comments: pt able to recall 3/3 back precautions Required Braces or Orthoses: Spinal Brace Spinal Brace: Applied in supine position;Thoracolumbosacral orthotic;Other (comment)   Pertinent Vitals/Pain No c/o pain.    ADL  Grooming: Wash/dry hands;Set up Lower Body Dressing: +1 Total assistance Where Assessed - Lower Body Dressing: Rolling right and/or left Equipment Used: Back brace;Rolling walker Transfers/Ambulation Related to ADLs: Pt  verbally cuing herself hand placement and sequence with RW. ADL Comments: Changed adult diaper rolling prior to donning back brace.    OT Diagnosis:    OT Problem List:   OT Treatment Interventions:     OT Goals ADL Goals Pt Will Perform Grooming: with set-up;with supervision;with min assist;Standing at sink ADL Goal: Grooming - Progress: Progressing toward goals Pt Will Perform Lower Body Bathing: with mod assist;with adaptive equipment Pt Will Perform Lower Body Dressing: with mod assist;with adaptive equipment Pt Will Transfer to Toilet: with min assist;with DME;Grab bars;Maintaining back safety precautions ADL Goal: Toilet Transfer - Progress: Progressing toward goals Pt Will Perform Toileting - Clothing Manipulation: with min assist;Standing ADL Goal: Toileting - Clothing Manipulation - Progress: Not  met Pt Will Perform Toileting - Hygiene: with min assist;Sit to stand from 3-in-1/toilet  Visit Information  Last OT Received On: 09/07/12 Assistance Needed: +1    Subjective Data      Prior Functioning       Cognition  Cognition Overall Cognitive Status: Impaired Area of Impairment: Memory Arousal/Alertness: Awake/alert Behavior During Session: [facility] for tasks performed    Mobility  Bed Mobility Bed Mobility: Rolling Right;Rolling Left;Left Sidelying to Sit;Sitting - Scoot to Edge of Bed Rolling Right: 4: Min guard;With rail Rolling Left: 4: Min guard;With rail Left Sidelying to Sit: 4: Min guard Sitting - Scoot to Edge of Bed: 5: Supervision Details for Bed Mobility Assistance: cues for sequencing, log roll, safe technique Transfers Transfers: Sit to Stand;Stand to Sit Sit to Stand: 4: Min guard;With upper extremity assist;From bed Stand to Sit: 4: Min guard;To chair/3-in-1;With upper extremity assist;To bed;To toilet    Exercises      Balance     End of Session OT - End of Session Activity Tolerance: Patient tolerated treatment well Patient left: in chair;with call bell/phone within reach  GO     Evern Bio 09/07/2012, 12:29 PM [phone]

## 2012-09-07 NOTE — Clinical Social Work Note (Signed)
Clinical Social Work   CSW met with pt to address discharge plan. [facility] was unable to make a bed offer. Pt appears to be indescicive as her choice facilities do not have private rooms. Per RN, awaiting stool sample to test for C.Diff. Discharge plan can be finalized after results. [employer] [facility] has been initiated. CSW will continue to follow.   Dede Query, MSW, LCSW [phone]

## 2012-09-07 NOTE — Progress Notes (Signed)
Doing well.  Temp:  [97.8 F (36.6 C)-98.4 F (36.9 C)] 98.1 F (36.7 C) (04/10 0609) Pulse Rate:  [72-79] 76 (04/10 0609) Resp:  [16-18] 18 (04/10 0609) BP: (136-171)/(45-60) 171/60 mmHg (04/10 0609) SpO2:  [95 %-99 %] 95 % (04/10 0609) Good strength and sensation Incision CDI  Plan: Await placement

## 2012-09-07 NOTE — Discharge Summary (Addendum)
Physician Discharge Summary  Patient ID: Amber Rollins MRN: [id number] DOB/AGE: December 01, 1930 79 y.o.  Admit date: 08/30/2012 Discharge date: 09/07/2012  Admission Diagnoses:T7,8,12 compression Fracture  Discharge Diagnoses: T7,8,12 compression Fracture  Active Problems:   * No active hospital problems. *   Discharged Condition: fair  Hospital Course: 79 yo F fell from attic 3/17 Sustained multiple injuries including a T8 burst fx and T12 comp fx - seen in F/U and pt has worsening kyphosis at T8 - admitted for CT andSurgery -  Pt underwent procedure below  - then started increasing activity with Tx's  - rehab consulted, but felt pt did now qualify  - so OPt will be transferred to SNF for continued rehab.  She has no back pain , ambulating  - needs brace when OOB   Consults: None  Significant Diagnostic Studies: ct  Treatments: surgery: T7, T8,T12 balloon Kyphoplasty , with methylmethacrylate and flouroscopy   Discharge Exam: Blood pressure 171/60, pulse 76, temperature 98.1 F (36.7 C), temperature source Oral, resp. rate 18, height 5\' 3"  (1.6 m), weight 59.421 kg (131 lb), SpO2 95.00%. Wound:c/d/i  Disposition: SNF     Medication List    TAKE these medications       calcium-vitamin D 500-200 MG-UNIT per tablet  Commonly known as:  OSCAL WITH D  Take 1 tablet by mouth daily.     CENTRUM CARDIO Tabs  Take 1 tablet by mouth daily.     fexofenadine 180 MG tablet  Commonly known as:  ALLEGRA  Take 180 mg by mouth daily as needed (allergies).     Fish Oil 1200 MG Caps  Take 2 by mouth daily     FLONASE 50 MCG/ACT nasal spray  Generic drug:  fluticasone  1-2 sprays each nostril daily.     hydrochlorothiazide 25 MG tablet  Commonly known as:  HYDRODIURIL  Take 50 mg by mouth every morning.     HYDROcodone-acetaminophen 5-325 MG per tablet  Commonly known as:  NORCO/VICODIN  Take 1-2 tablets by mouth every 4 (four) hours as needed for pain (pt can take up to  2 tabs for moderate to severe pain).     naproxen 500 MG tablet  Commonly known as:  NAPROSYN  Take 1 tablet (500 mg total) by mouth 2 (two) times daily with a meal.     omeprazole 20 MG capsule  Commonly known as:  PRILOSEC  Take 1 capsule (20 mg total) by mouth 2 (two) times daily.     PARoxetine 10 MG tablet  Commonly known as:  PAXIL  Take 10 mg by mouth every morning.     potassium chloride 10 MEQ tablet  Commonly known as:  K-DUR,KLOR-CON  Take 10 mEq by mouth daily.     traMADol 50 MG tablet  Commonly known as:  ULTRAM  Take 2 tablets (100 mg total) by mouth every 6 (six) hours.       activity  - Needs to don/doff brace while laying down - never have HOB greater than 25 degrees ( and never OOB) without brace on!!!!  F/U in office in 3 weeks  09/12/2012 - Stable very mild C. difficile on Flagyl 500 mg by mouth twice a day - to SNF  Signed: Clydene Fake, MD 09/07/2012, 9:43 AM   09/14/2012 - no change - to SNF

## 2012-09-08 NOTE — Progress Notes (Signed)
Physical Therapy Treatment Patient Details Name: Amber Rollins MRN: [id number] DOB: 12-May-1931 Today's Date: 09/08/2012 Time: 4098-1191 PT Time Calculation (min): 18 min  PT Assessment / Plan / Recommendation Comments on Treatment Session  Pt progressing with mobility.  However, slight posterior lean.  Patient recalls back precautions.  Patient highly motivated.  Patient looking forward to making it to PLOF. Continue to recommend SNF, PT to follow.    Follow Up Recommendations  SNF     Does the patient have the potential to tolerate intense rehabilitation     Barriers to Discharge        Equipment Recommendations       Recommendations for Other Services    Frequency Min 3X/week   Plan Discharge plan remains appropriate;Frequency remains appropriate    Precautions / Restrictions Precautions Precautions: Back;Fall Precaution Comments: pt able to recall 3/3 back precautions Required Braces or Orthoses: Spinal Brace Spinal Brace: Applied in supine position;Thoracolumbosacral orthotic;Other (comment) Restrictions Weight Bearing Restrictions: No   Pertinent Vitals/Pain no apparent distress     Mobility  Bed Mobility Bed Mobility: Rolling Right;Rolling Left;Left Sidelying to Sit;Sitting - Scoot to Edge of Bed Rolling Right: 4: Min guard;With rail Rolling Left: 4: Min guard;With rail Right Sidelying to Sit: 4: Min guard Left Sidelying to Sit: 4: Min guard Sitting - Scoot to Edge of Bed: 5: Supervision Details for Bed Mobility Assistance: Patient log rolled with excellent technique. Transfers Transfers: Sit to Stand;Stand to Sit Sit to Stand: 4: Min guard;With upper extremity assist;From bed Stand to Sit: To chair/3-in-1;With upper extremity assist;With armrests;4: Min guard Details for Transfer Assistance: Patient transferred x 2 for safe technique.  Ambulation/Gait Ambulation/Gait Assistance: 4: Min assist Ambulation Distance (Feet): 145 Feet Assistive device:  Rolling walker Ambulation/Gait Assistance Details: Patient slightly unsteady.  Min A for posterior lean.  Patient motivated to walk.  Cues to keep walker closer. Gait Pattern: Step-through pattern;Decreased stride length;Trunk flexed Gait velocity: Decreased.    Exercises     PT Diagnosis:    PT Problem List:   PT Treatment Interventions:     PT Goals Acute Rehab PT Goals PT Goal: Rolling Supine to Right Side - Progress: Met PT Goal: Rolling Supine to Left Side - Progress: Met PT Goal: Supine/Side to Sit - Progress: Met PT Goal: Sit to Stand - Progress: Progressing toward goal PT Goal: Stand to Sit - Progress: Progressing toward goal PT Goal: Ambulate - Progress: Progressing toward goal Additional Goals PT Goal: Additional Goal #1 - Progress: Progressing toward goal  Visit Information  Last PT Received On: 09/08/12 Assistance Needed: +1    Subjective Data      Cognition  Cognition Overall Cognitive Status: Appears within functional limits for tasks assessed/performed Arousal/Alertness: Awake/alert Orientation Level: Appears intact for tasks assessed Behavior During Session: [facility] for tasks performed    Balance     End of Session PT - End of Session Equipment Utilized During Treatment: Gait belt;Back brace Activity Tolerance: Patient tolerated treatment well Patient left: in chair;with call bell/phone within reach   GP     [facility], Keyton Bhat JEAN SPTA 09/08/2012, 3:21 PM

## 2012-09-08 NOTE — Progress Notes (Signed)
Per Nursing- patient had loose stools for the past 2 days and orders to test for possible C-Diff were placed for patient.  A stool culture was not sent to lab during period of loose stools; per nursing- she has not had any stools today and they are unable to obtain a culture.  Thus- will continue to await either stool or to have formed stools for 48 hours prior to d/c to SNF.  Bed is available at [facility];  Notified facility of d/c delay and CSW will need to follow up with SNF on Monday.  Lorri Frederick. [place] Pugh  [phone]

## 2012-09-08 NOTE — Progress Notes (Signed)
Doing well.   Temp:  [97.8 F (36.6 C)-98.2 F (36.8 C)] 98.2 F (36.8 C) (04/11 1610) Pulse Rate:  [74-87] 83 (04/11 0632) Resp:  [16-19] 16 (04/11 0632) BP: (144-190)/(63-80) 153/73 mmHg (04/11 0632) SpO2:  [94 %-99 %] 97 % (04/11 9604)  PE - stable  Plan: Await placement

## 2012-09-09 LAB — CLOSTRIDIUM DIFFICILE BY PCR: Toxigenic C. Difficile by PCR: POSITIVE — AB

## 2012-09-09 MED ORDER — BACID PO TABS
2.0000 | ORAL_TABLET | Freq: Three times a day (TID) | ORAL | Status: DC
  Filled 2012-09-09 (×2): qty 2

## 2012-09-09 MED ORDER — RISAQUAD PO CAPS
2.0000 | ORAL_CAPSULE | Freq: Three times a day (TID) | ORAL | Status: DC
  Administered 2012-09-09 – 2012-09-14 (×16): 2 via ORAL
  Filled 2012-09-09 (×17): qty 2

## 2012-09-09 MED ORDER — LACTOBACILLUS PO CAPS: 2.0000 | ORAL_CAPSULE | Freq: Three times a day (TID) | ORAL | Status: DC

## 2012-09-09 NOTE — Progress Notes (Signed)
Dr. Danielle Dess (on call) notified of positive C-diff result for patient. Probiotics ordered. Will continue to monitor.

## 2012-09-09 NOTE — Progress Notes (Signed)
Filed Vitals:   09/08/12 1801 09/08/12 2058 09/09/12 0223 09/09/12 0645  BP: 156/78 148/78 160/76 138/72  Pulse: 80 78 78 72  Temp: 98.3 F (36.8 C) 98.1 F (36.7 C) 97.6 F (36.4 C) 97.8 F (36.6 C)  TempSrc:  Oral Oral Oral  Resp: 20 18 15 18   Height:      Weight:      SpO2:  96% 96% 95%    Patient sitting up in chair, in TLSO. Reports that she walked with a staff in the hallway yesterday. Reports that she is in much less pain following multilevel kyphoplasty, as compared to prior surgery.  Nursing staff finally obtained stool sample this morning for C. difficile evaluation. Result pending. Nursing staff reports that the stool was performed, rather than liquid. Notably he took 2 days to obtained stool sample.  Plan: For transfer to SNF once determination is made with the patient has C. difficile or not (tend to doubt).  Hewitt Shorts, MD 09/09/2012, 8:52 AM

## 2012-09-10 MED ORDER — METRONIDAZOLE 500 MG PO TABS
500.0000 mg | ORAL_TABLET | Freq: Two times a day (BID) | ORAL | Status: DC
  Administered 2012-09-10 – 2012-09-14 (×9): 500 mg via ORAL
  Filled 2012-09-10 (×10): qty 1

## 2012-09-10 NOTE — Progress Notes (Signed)
Subjective: Patient reports + BM and Vital signs stable patient notes bowel movement was loose but not particularly diarrhea. Note positive C. difficile screen  Objective: Vital signs in last 24 hours: Temp:  [97.6 F (36.4 C)-98.1 F (36.7 C)] 97.9 F (36.6 C) (04/13 0510) Pulse Rate:  [71-84] 81 (04/13 0510) Resp:  [20] 20 (04/13 0510) BP: (142-158)/(64-74) 146/65 mmHg (04/13 0510) SpO2:  [94 %-98 %] 94 % (04/13 0510)  Intake/Output from previous day: 04/12 0701 - 04/13 0700 In: 480 [P.O.:480] Out: -  Intake/Output this shift: Total I/O In: 240 [P.O.:240] Out: -   No significant distress patient feels reasonably well brace fit is poor according to her.  Lab Results: No results found for this basename: WBC, HGB, HCT, PLT,  in the last 72 hours BMET No results found for this basename: NA, K, CL, CO2, GLUCOSE, BUN, CREATININE, CALCIUM,  in the last 72 hours  Studies/Results: No results found.  Assessment/Plan: Stable very mild C. difficile  LOS: 11 days  Start Flagyl 500 mg by mouth twice a day   Nyree Yonker J 09/10/2012, 12:06 PM

## 2012-09-11 NOTE — Progress Notes (Signed)
Seen and agreed 09/11/2012 Robinette, Julia Elizabeth PTA [phone] pager [phone] office    

## 2012-09-11 NOTE — Progress Notes (Signed)
Physical Therapy Treatment Patient Details Name: Amber Rollins MRN: [id number] DOB: 08/10/1930 Today's Date: 09/11/2012 Time: 1432-1500 PT Time Calculation (min): 28 min  PT Assessment / Plan / Recommendation Comments on Treatment Session  Pt progressing with mobility.  However, slight posterior lean.  Patient recalls back precautions.  Patient highly motivated.  Patient looking forward to making it to PLOF. Continue to recommend SNF, PT to follow.    Follow Up Recommendations  SNF     Does the patient have the potential to tolerate intense rehabilitation     Barriers to Discharge        Equipment Recommendations  None recommended by PT    Recommendations for Other Services    Frequency Min 3X/week   Plan Discharge plan remains appropriate;Frequency remains appropriate    Precautions / Restrictions Precautions Precautions: Back;Fall Precaution Comments: pt able to recall 3/3 back precautions Required Braces or Orthoses: Spinal Brace Spinal Brace: Applied in supine position;Thoracolumbosacral orthotic;Other (comment) Restrictions Weight Bearing Restrictions: No   Pertinent Vitals/Pain no apparent distress     Mobility  Bed Mobility Bed Mobility: Rolling Right;Rolling Left;Left Sidelying to Sit;Sitting - Scoot to Edge of Bed Rolling Right: 5: Supervision;With rail Rolling Left: 5: Supervision Right Sidelying to Sit: 4: Min guard Sitting - Scoot to Edge of Bed: 5: Supervision Details for Bed Mobility Assistance: Patient log rolled with excellent technique. Transfers Transfers: Stand to Sit;Sit to Stand Sit to Stand: From bed;With upper extremity assist;5: Supervision Stand to Sit: To bed;With upper extremity assist;5: Supervision Details for Transfer Assistance: Patient transferred with safe technique.  Ambulation/Gait Ambulation/Gait Assistance: 4: Min guard Ambulation Distance (Feet): 160 Feet Assistive device: Rolling walker Ambulation/Gait Assistance  Details: Patient slightly unsteady.  Min A for posterior lean.  Patient motivated to walk. Gait Pattern: Step-through pattern;Decreased stride length;Trunk flexed Gait velocity: Decreased. Stairs: No    Exercises     PT Diagnosis:    PT Problem List:   PT Treatment Interventions:     PT Goals Acute Rehab PT Goals PT Goal: Rolling Supine to Right Side - Progress: Met PT Goal: Rolling Supine to Left Side - Progress: Met PT Goal: Sit to Supine/Side - Progress: Met PT Goal: Sit to Stand - Progress: Met PT Goal: Stand to Sit - Progress: Met PT Goal: Ambulate - Progress: Progressing toward goal Additional Goals PT Goal: Additional Goal #1 - Progress: Progressing toward goal  Visit Information  Last PT Received On: 09/11/12 Assistance Needed: +1    Subjective Data      Cognition  Cognition Overall Cognitive Status: Appears within functional limits for tasks assessed/performed Arousal/Alertness: Awake/alert Orientation Level: Appears intact for tasks assessed Behavior During Session: [facility] for tasks performed    Balance     End of Session PT - End of Session Equipment Utilized During Treatment: Back brace Activity Tolerance: Patient tolerated treatment well Patient left: in bed;with call bell/phone within reach   GP     [facility], Runette Scifres JEAN SPTA 09/11/2012, 3:19 PM

## 2012-09-11 NOTE — Progress Notes (Signed)
Doing well.    Temp:  [97.3 F (36.3 C)-98 F (36.7 C)] 97.6 F (36.4 C) (04/14 0541) Pulse Rate:  [73-85] 73 (04/14 0541) Resp:  [16-20] 16 (04/14 0541) BP: (133-149)/(62-81) 136/62 mmHg (04/14 0541) SpO2:  [93 %-97 %] 93 % (04/14 0541) Good strength and sensation Incision CDI  Plan: Await SNF placement

## 2012-09-12 NOTE — Progress Notes (Signed)
Physical Therapy Treatment Patient Details Name: Amber Rollins MRN: [id number] DOB: [birth year] Today's Date: 09/12/2012 Time: 1355-1430 PT Time Calculation (min): 35 min  PT Assessment / Plan / Recommendation Comments on Treatment Session  Pt progressing well with mobility, significantly increased ambulation distance and practiced steps today. Pt demonstrated posterior lean mostly when doing steps, needs verbal and tactile cues to correct. Overall making great progress, continue to recommend SNF to promote return to prior level of function and decrease risk for falls.     Follow Up Recommendations  SNF           Equipment Recommendations  None recommended by PT       Frequency Min 3X/week   Plan Discharge plan remains appropriate;Frequency remains appropriate    Precautions / Restrictions Precautions Precautions: Back;Fall Precaution Comments: pt able to recall 3/3 back precautions however needs mod verbal cues during activity to adhere to precautions Required Braces or Orthoses: Spinal Brace Spinal Brace: Applied in supine position;Thoracolumbosacral orthotic;Other (comment) Restrictions Weight Bearing Restrictions: No   Pertinent Vitals/Pain No c/o pain, C/o nausea at end of session (SpO2 99% on room air, HR 95 bpm) RN aware    Mobility  Bed Mobility Rolling Right: 5: Supervision Rolling Left: 5: Supervision Right Sidelying to Sit: 5: Supervision Details for Bed Mobility Assistance: Pt demonstrates good log roll technique Transfers Transfers: Sit to Stand;Stand to Sit Sit to Stand: 5: Supervision;From bed;Other (comment) (mat) Stand to Sit: 5: Supervision;To chair/3-in-1;Other (comment) (mat) Ambulation/Gait Ambulation/Gait Assistance: 4: Min guard Ambulation Distance (Feet): 300 Feet (+100) Assistive device: Rolling walker Ambulation/Gait Assistance Details: Cues for upright posture and RW proximity. Occasional posterior lean and instability particularly with  turns.  Gait Pattern: Step-through pattern;Trunk flexed;Decreased stride length Stairs: Yes Stairs Assistance: 3: Mod assist;4: Min assist Stairs Assistance Details (indicate cue type and reason): Mostly required min assist however required up to mod assist for posterior lean with descending steps Stair Management Technique: Two rails Number of Stairs: 2 Wheelchair Mobility Wheelchair Mobility: No     PT Goals Acute Rehab PT Goals PT Goal Formulation: With patient Time For Goal Achievement: 08/25/12 Potential to Achieve Goals: Good Pt will Roll Supine to Right Side: with min assist PT Goal: Rolling Supine to Right Side - Progress: Met Pt will go Supine/Side to Sit: with min assist PT Goal: Supine/Side to Sit - Progress: Met Pt will go Sit to Stand: with supervision PT Goal: Sit to Stand - Progress: Met Pt will go Stand to Sit: with supervision PT Goal: Stand to Sit - Progress: Met Pt will Ambulate: >150 feet;with supervision;with rolling walker PT Goal: Ambulate - Progress: Progressing toward goal Additional Goals Additional Goal #1: Pt will verbalize and adhere to back precautions when mobilizing PT Goal: Additional Goal #1 - Progress: Progressing toward goal  Visit Information  Last PT Received On: 09/12/12 Assistance Needed: +1    Subjective Data  Subjective: I'm ready to get out of bed and move around Patient Stated Goal: Return home   Cognition  Cognition Overall Cognitive Status: Appears within functional limits for tasks assessed/performed Arousal/Alertness: Awake/alert Orientation Level: Appears intact for tasks assessed Behavior During Session: [facility] for tasks performed Memory Deficits: Min memory deficits noted during session       End of Session PT - End of Session Equipment Utilized During Treatment: Gait belt;Back brace Activity Tolerance: Patient tolerated treatment well Patient left: in chair;with call bell/phone within reach;with family/visitor present  (OT) Nurse Communication: Mobility status (Nausea)   [name] 09/12/2012, 3:07 PM

## 2012-09-12 NOTE — Progress Notes (Signed)
Doing well.  Temp:  [97.6 F (36.4 C)-98.6 F (37 C)] 98 F (36.7 C) (04/15 0600) Pulse Rate:  [70-73] 72 (04/15 0600) Resp:  [18] 18 (04/15 0600) BP: (109-140)/(55-78) 112/60 mmHg (04/15 0600) SpO2:  [97 %-98 %] 97 % (04/15 0600) stable Plan: SNF

## 2012-09-12 NOTE — Progress Notes (Signed)
Seen and agreed 09/12/2012 Robinette, Julia Elizabeth PTA [phone] pager [phone] office    

## 2012-09-12 NOTE — Clinical Social Work Note (Signed)
CSW talked with patient and family member in room about SNF availability. Patient chose [facility], [address] and [facility] in that order. CSW also talked with patient regarding expanding SNF search if her preferences do not have a private room and she was indecisive. Patient advised of all bed offers, however she declined [facility] and [address] CSW made contact with [facility] and [facility] and they do not have a private room. Call made to [facility] and message left for admissions director.   Genelle Bal, MSW, LCSW [phone]

## 2012-09-12 NOTE — Progress Notes (Signed)
Occupational Therapy Treatment Patient Details Name: Amber Rollins MRN: [id number] DOB: 10-06-1930 Today's Date: 09/12/2012 Time: 4098-1191 OT Time Calculation (min): 24 min  OT Assessment / Plan / Recommendation Comments on Treatment Session Pt making good progress with therapy and adhering to back precautions. Pt able to recall 3/3 precautions     Follow Up Recommendations  Supervision/Assistance - 24 hour    Barriers to Discharge       Equipment Recommendations    defer to next venue   Recommendations for Other Services    Frequency Min 2X/week   Plan Discharge plan remains appropriate    Precautions / Restrictions Precautions Precautions: Back;Fall Precaution Comments: pt able to recall 3/3 back precautions however needs mod verbal cues during activity to adhere to precautions Required Braces or Orthoses: Spinal Brace Spinal Brace: Applied in supine position;Thoracolumbosacral orthotic;Other (comment) Restrictions Weight Bearing Restrictions: No   Pertinent Vitals/Pain No c/o pain     ADL  Lower Body Bathing: Simulated Lower Body Dressing: Simulated;Minimal assistance Transfers/Ambulation Related to ADLs: Pt had just returned from walking with Pt when c/o nausea. Educated pt and pt's family member on AE for LB bathing and dressing. Pt with increased indepedence with AE.  ADL Comments: Pt plans to purchase AE kit. Discussed showering with brace on since brace has to be donned in supine. Pt also c/o about fitting of brace - "cutting into my throat" Called [employer] for someone to come and adjust her brace for a better fit and request another set of pads for pt to shower with.       OT Goals ADL Goals ADL Goal: Lower Body Bathing - Progress: Progressing toward goals ADL Goal: Lower Body Dressing - Progress: Progressing toward goals  Visit Information  Last OT Received On: 09/07/12 Assistance Needed: +1 Reason Eval/Treat Not Completed: Fatigue/lethargy limiting  ability to participate (pt feeling nausea)    Subjective Data  Subjective: I feel nauseous this afternoon.  Patient Stated Goal: Eager to get home    Prior Functioning       Cognition  Cognition Overall Cognitive Status: Appears within functional limits for tasks assessed/performed Arousal/Alertness: Awake/alert Orientation Level: Appears intact for tasks assessed Behavior During Session: [facility] for tasks performed Memory Deficits: Min memory deficits noted during session    Mobility       Exercises      Balance     End of Session OT - End of Session Equipment Utilized During Treatment: Back brace Activity Tolerance: Patient tolerated treatment well;Patient limited by fatigue Patient left: in chair;with call bell/phone within reach;with family/visitor present  GO     Adan Sis 09/12/2012, 2:57 PM

## 2012-09-13 NOTE — Clinical Social Work Note (Signed)
Clinical Social Work   CSW met with pt to address discharge plan. CSW explained that it is questionable that insurance will cover the rest of pt's stay. Pt has expressed that she is agreeable to taking that chance, as pt declined only facility that confirmed private room availability for today. [facility] is able to accept pt on 4/17. Pt was then agreeable to expanding search to [facility]. CSW was able to secure a private room for 4/17 at [facility], which pt is agreeable to. CSW left a message with [facility] with update to discharge plan. CSW will continue to follow and facilitate discharge to [facility] on 09/14/2012.   Dede Query, MSW, LCSW [phone]

## 2012-09-13 NOTE — Progress Notes (Signed)
Occupational Therapy Treatment Patient Details Name: Aliscia Clayton MRN: [id number] DOB: [birth year] Today's Date: 09/13/2012 Time: 4098-1191 OT Time Calculation (min): 25 min  OT Assessment / Plan / Recommendation Comments on Treatment Session Pt progressing toward goals.  Planning to d/c to SNF.    Follow Up Recommendations  SNF    Barriers to Discharge       Equipment Recommendations       Recommendations for Other Services    Frequency Min 2X/week   Plan Discharge plan remains appropriate    Precautions / Restrictions Precautions Precautions: Back;Fall Precaution Comments: Able to independently verbalize 3/3 back precautions.  Min cueing to maintain precautions during mobility. Required Braces or Orthoses: Spinal Brace Spinal Brace: Applied in supine position;Thoracolumbosacral orthotic;Other (comment)   Pertinent Vitals/Pain See vitals    ADL  Eating/Feeding: Performed;Independent Where Assessed - Eating/Feeding: Chair Grooming: Performed;Wash/dry face;Supervision/safety Where Assessed - Grooming: Unsupported standing Upper Body Dressing: Performed;Set up Where Assessed - Upper Body Dressing: Unsupported sitting Toilet Transfer: Simulated;Min guard Toilet Transfer Method: Sit to Barista:  (bed) Equipment Used: Back brace;Rolling walker Transfers/Ambulation Related to ADLs: Ambulated in hallway with RW at sup level. ADL Comments: Educated pt on directing caregiver in assisting with donning back brace.  Pt did better today in maintaining back precautions with only 2 cues to avoid twisting when reaching for items.  Discussed in depth application of precuations during ADLs (such as arching occuring when reaching above head, twisting when flushing toilet).      OT Diagnosis:    OT Problem List:   OT Treatment Interventions:     OT Goals Acute Rehab OT Goals OT Goal Formulation: With patient/family Time For Goal Achievement:  09/27/12 Potential to Achieve Goals: Good ADL Goals Pt Will Perform Grooming: with set-up;with supervision;with min assist;Standing at sink ADL Goal: Grooming - Progress: Met Pt Will Perform Lower Body Bathing: with mod assist;with adaptive equipment ADL Goal: Lower Body Bathing - Progress:  (continue with goal) Pt Will Perform Lower Body Dressing: with mod assist;with adaptive equipment ADL Goal: Lower Body Dressing - Progress:  (continue with goal) Pt Will Transfer to Toilet: with supervision;Ambulation;with DME;Comfort height toilet;Maintaining back safety precautions ADL Goal: Toilet Transfer - Progress: Goal set today Pt Will Perform Toileting - Clothing Manipulation: with min assist;Standing ADL Goal: Toileting - Clothing Manipulation - Progress:  (continue with goal) Pt Will Perform Toileting - Hygiene: with min assist;Sit to stand from 3-in-1/toilet ADL Goal: Toileting - Hygiene - Progress:  (continue with goal)  Visit Information  Last OT Received On: 09/13/12 Assistance Needed: +1    Subjective Data      Prior Functioning       Cognition  Cognition Arousal/Alertness: Awake/alert Behavior During Therapy: WFL for tasks assessed/performed Overall Cognitive Status: Within Functional Limits for tasks assessed    Mobility  Bed Mobility Bed Mobility: Rolling Right;Rolling Left;Right Sidelying to Sit;Sitting - Scoot to Edge of Bed Rolling Right: 5: Supervision;With rail Rolling Left: 5: Supervision;With rail Right Sidelying to Sit: 5: Supervision Sitting - Scoot to Edge of Bed: 5: Supervision Details for Bed Mobility Assistance: Demonstrating good log roll technique. Transfers Transfers: Sit to Stand;Stand to Sit Sit to Stand: 4: Min guard;From bed Stand to Sit: 5: Supervision;To chair/3-in-1 Details for Transfer Assistance: Min guard due to increased time and effort to complete sit<>stand from bed.     Exercises      Balance     End of Session OT - End of  Session Equipment  Utilized During Treatment: Back brace Activity Tolerance: Patient tolerated treatment well;Patient limited by fatigue Patient left: in chair;with call bell/phone within reach;with family/visitor present Nurse Communication: Mobility status  GO    09/13/2012 Cipriano Mile OTR/L Pager [phone] Office [phone]  Cipriano Mile 09/13/2012, 11:53 AM

## 2012-09-13 NOTE — Progress Notes (Signed)
Doing well. Diarrhea resolved  Temp:  [97.6 F (36.4 C)-98 F (36.7 C)] 98 F (36.7 C) (04/16 0600) Pulse Rate:  [72-81] 72 (04/16 0600) Resp:  [18] 18 (04/16 0600) BP: (114-144)/(49-65) 114/49 mmHg (04/16 0600) SpO2:  [95 %-98 %] 97 % (04/16 0600) PE - stable Plan: Await SNF -

## 2012-09-14 MED ORDER — METRONIDAZOLE 500 MG PO TABS: 500.0000 mg | ORAL_TABLET | Freq: Two times a day (BID) | ORAL | Status: AC

## 2012-09-14 NOTE — Clinical Social Work Note (Signed)
Clinical Social Worker facilitated patient discharge including contacting patient family (patient daughter at facility completing paperwork) and facility to confirm patient discharge.  CSW arranged ambulance transport to [facility] via [facility].  RN to call report prior to patient discharge.  Clinical Social Worker will sign off for now as social work intervention is no longer needed. Please consult [place] again if new need arises.  Macario Golds, [place] [phone]

## 2012-09-14 NOTE — Progress Notes (Signed)
Doing well.  Temp:  [98 F (36.7 C)-98.5 F (36.9 C)] 98.1 F (36.7 C) (04/17 0616) Pulse Rate:  [68-76] 68 (04/17 0616) Resp:  [17-18] 17 (04/17 0616) BP: (118-143)/(57-58) 143/58 mmHg (04/17 0616) SpO2:  [94 %-100 %] 94 % (04/17 0616) stable Plan: SNF

## 2012-09-14 NOTE — Progress Notes (Signed)
Physical Therapy Treatment Patient Details Name: Amber Rollins MRN: [id number] DOB: 1930/08/08 Today's Date: 09/14/2012 Time: 4098-1191 PT Time Calculation (min): 23 min  PT Assessment / Plan / Recommendation Comments on Treatment Session  Progressing well, should start balance work less restrictive device.    Follow Up Recommendations  SNF     Does the patient have the potential to tolerate intense rehabilitation     Barriers to Discharge        Equipment Recommendations  None recommended by PT    Recommendations for Other Services    Frequency Min 3X/week   Plan Discharge plan remains appropriate;Frequency remains appropriate    Precautions / Restrictions Precautions Precautions: Back;Fall Precaution Comments: Able to independently verbalize 3/3 back precautions.  Min cueing to maintain precautions during mobility. Required Braces or Orthoses: Spinal Brace Spinal Brace: Applied in supine position;Thoracolumbosacral orthotic;Other (comment)   Pertinent Vitals/Pain     Mobility  Bed Mobility Bed Mobility: Not assessed Transfers Transfers: Sit to Stand;Stand to Sit Sit to Stand: 4: Min guard;From chair/3-in-1;With upper extremity assist Stand to Sit: 5: Supervision;With upper extremity assist;To chair/3-in-1 Details for Transfer Assistance: reinforced hand placement for safety Ambulation/Gait Ambulation/Gait Assistance: 5: Supervision Ambulation Distance (Feet): 450 Feet Assistive device: Rolling walker Ambulation/Gait Assistance Details: pt is steady enough that it is time to work on amb. without RW. Gait Pattern: Step-through pattern;Decreased stride length Gait velocity: slower, but now able to noticeably increase speed.    Exercises     PT Diagnosis:    PT Problem List:   PT Treatment Interventions:     PT Goals Acute Rehab PT Goals Time For Goal Achievement: 08/25/12 Potential to Achieve Goals: Good Pt will go Sit to Stand: with supervision PT  Goal: Sit to Stand - Progress: Partly met Pt will go Stand to Sit: with supervision PT Goal: Stand to Sit - Progress: Partly met Pt will Ambulate: >150 feet;with supervision;with rolling walker PT Goal: Ambulate - Progress: Met Additional Goals Additional Goal #1: Pt will verbalize and adhere to back precautions when mobilizing PT Goal: Additional Goal #1 - Progress: Progressing toward goal  Visit Information  Last PT Received On: 09/14/12 Assistance Needed: +1    Subjective Data  Subjective: Not bad huh?   Cognition  Cognition Arousal/Alertness: Awake/alert Behavior During Therapy: WFL for tasks assessed/performed Overall Cognitive Status: Within Functional Limits for tasks assessed    Balance  Balance Balance Assessed: Yes Static Standing Balance Static Standing - Balance Support: Bilateral upper extremity supported Static Standing - Level of Assistance: 5: Stand by assistance  End of Session PT - End of Session Equipment Utilized During Treatment: Back brace Activity Tolerance: Patient tolerated treatment well Patient left: in chair;with call bell/phone within reach;with family/visitor present Nurse Communication: Mobility status   GP     Amber Rollins, [name] 09/14/2012, 11:41 AM 09/14/2012  [name], PT [phone] (pager)

## 2012-09-18 ENCOUNTER — Non-Acute Institutional Stay (SKILLED_NURSING_FACILITY): Payer: Medicare Other | Admitting: Internal Medicine

## 2012-09-18 DIAGNOSIS — S22080D Wedge compression fracture of T11-T12 vertebra, subsequent encounter for fracture with routine healing: Secondary | ICD-10-CM

## 2012-09-18 DIAGNOSIS — M8448XD Pathological fracture, other site, subsequent encounter for fracture with routine healing: Secondary | ICD-10-CM

## 2012-09-18 DIAGNOSIS — K219 Gastro-esophageal reflux disease without esophagitis: Secondary | ICD-10-CM

## 2012-09-18 DIAGNOSIS — F329 Major depressive disorder, single episode, unspecified: Secondary | ICD-10-CM

## 2012-09-18 DIAGNOSIS — M4000 Postural kyphosis, site unspecified: Secondary | ICD-10-CM

## 2012-09-18 DIAGNOSIS — M40204 Unspecified kyphosis, thoracic region: Secondary | ICD-10-CM

## 2012-09-18 DIAGNOSIS — I10 Essential (primary) hypertension: Secondary | ICD-10-CM

## 2012-09-18 NOTE — Progress Notes (Signed)
Patient ID: Amber Rollins, female   DOB: Oct 30, 1930, 79 y.o.   MRN: [id number]    PCP: Roxy Manns, MD  Allergies  Allergen Reactions  . Tdap (Diphth-Acell Pertussis-Tetanus) Swelling  . Amlodipine Besylate     REACTION: edema  . Codeine     headache  . Lovastatin     REACTION: leg pain  . Penicillins     Unknown reaction  . Sertraline Hcl     REACTION: caused insomnia  . Simvastatin     REACTION: non tolerant- leg pain  . Latex Rash  . Sulfonamide Derivatives Rash    Chief Complaint: new admit post hospitalization 08/30/12- 09/15/12  HPI:  79 y/o female was admitted to the hospital post fall from attic 08/14/12 and worsening back pain. She was found to have multiple injuries including T8 burst fracture and t12 compression fracture. She was treated in a TLSO and transferred to [facility] SN[facility] for STR. She had worsening of her pain and kyphosis and was readmitted to hospital on above date  She underwent kyphoplasty T7, T8 and T12. She developed c.diff colitis while in hospital and was started on flagyl. She is here to undergo STR. She was seen in the room today. She has had 3 loose stools this am. She was given stool softener yesterday. Denies any abdominal pain, nausea or vomiting. Is wearing her brace and sitting on a wheelchair. Her daughter is with her this visit. She is on contact precautions with her c.diff infection. She denies any complaints. She has not started working with PT/OT yet. Se will assessed by the team today. Her pain is under control and has not been requiring current pain regimen.  Review of Systems: Review of Systems  Constitutional: Negative for fever and chills.  HENT: Negative for congestion.   Eyes: Negative for blurred vision.  Respiratory: Negative for cough and shortness of breath.   Cardiovascular: Negative for chest pain and palpitations.  Gastrointestinal: Positive for diarrhea. Negative for heartburn, nausea, vomiting and abdominal pain.   Genitourinary: Negative for dysuria.  Skin: Negative for itching and rash.  Neurological: Negative for dizziness, weakness and headaches.  Psychiatric/Behavioral: Negative for depression. The patient is not nervous/anxious and does not have insomnia.     Past Medical History  Diagnosis Date  . Hyperlipemia 05/31/1992  . Hypertension 05/31/2000  . Osteopenia 11/29/1999  . Osteoarthritis   . Pedal edema     worsened by Norvasc  . Chronic foot pain     mortons neuroma  . Plantar fasciitis   . Menopausal symptoms     vasomotor symptoms-severe  . Cystocele 01/01    neg. sx  . Hemorrhoids   . fracture fibula ? 2003    left  . Complication of anesthesia   . PONV (postoperative nausea and vomiting)   . GERD (gastroesophageal reflux disease)    Past Surgical History  Procedure Laterality Date  . Partial hysterectomy  1970    endometriosis  . Foot problems      neuromas  . Anterior repari w//surg proc  06/30/99    RSO  . History of ct      mass right adnexa, U?S by GYN-observation  . Bladder tack    . Bladder suspension    . Cataracts    . Kyphoplasty N/A 09/04/2012    Procedure: T7, T8,T12 Kyphoplasty ;  Surgeon: Clydene Fake, MD;  Location: [facility];  Service: Neurosurgery;  Laterality: N/A;  thoracic seven,eight, and twelve  Social History:   reports that she has never smoked. She has never used smokeless tobacco. She reports that she does not drink alcohol or use illicit drugs.  Family History  Problem Relation Age of Onset  . Diabetes Mother   . Heart disease Mother     pacemaker  . Hypertension Mother   . COPD Father   . Diabetes Sister   . Diabetes Brother   . Mental illness Son     anxiety-mental health problems  . Diabetes Brother   . Diabetes Sister   . Diabetes Sister   . Hypertension Other     Medications: Patient's Medications  New Prescriptions   No medications on file  Previous Medications   CALCIUM-VITAMIN D (OSCAL WITH D) 500-200  MG-UNIT PER TABLET    Take 1 tablet by mouth daily.   FEXOFENADINE (ALLEGRA) 180 MG TABLET    Take 180 mg by mouth daily as needed (allergies).   FLUTICASONE (FLONASE) 50 MCG/ACT NASAL SPRAY    1-2 sprays each nostril daily.   HYDROCHLOROTHIAZIDE (HYDRODIURIL) 25 MG TABLET    Take 50 mg by mouth every morning.   HYDROCODONE-ACETAMINOPHEN (NORCO/VICODIN) 5-325 MG PER TABLET    Take 1-2 tablets by mouth every 4 (four) hours as needed for pain (pt can take up to 2 tabs for moderate to severe pain).   METRONIDAZOLE (FLAGYL) 500 MG TABLET    Take 1 tablet (500 mg total) by mouth every 12 (twelve) hours.   MULTIPLE VITAMINS-MINERALS (CENTRUM CARDIO) TABS    Take 1 tablet by mouth daily.   NAPROXEN (NAPROSYN) 500 MG TABLET    Take 1 tablet (500 mg total) by mouth 2 (two) times daily with a meal.   OMEGA-3 FATTY ACIDS (FISH OIL) 1200 MG CAPS    Take 2 by mouth daily    OMEPRAZOLE (PRILOSEC) 20 MG CAPSULE    Take 1 capsule (20 mg total) by mouth 2 (two) times daily.   PAROXETINE (PAXIL) 10 MG TABLET    Take 10 mg by mouth every morning.   POTASSIUM CHLORIDE (K-DUR,KLOR-CON) 10 MEQ TABLET    Take 10 mEq by mouth daily.   TRAMADOL (ULTRAM) 50 MG TABLET    Take 2 tablets (100 mg total) by mouth every 6 (six) hours.  Modified Medications   No medications on file  Discontinued Medications   No medications on file   Physical Exam:  Filed Vitals:   09/18/12 1829  BP: 140/80  Pulse: 74  Temp: 97.1 F (36.2 C)  Resp: 16  SpO2: 96%   Physical Exam  Constitutional: She is oriented to person, place, and time.  Frail, elderly female patient in no acute distress  HENT:  Head: Normocephalic and atraumatic.  Eyes: Pupils are equal, round, and reactive to light.  Neck: Normal range of motion. Neck supple. No JVD present.  Cardiovascular: Normal rate and regular rhythm.   Pulmonary/Chest: Effort normal and breath sounds normal. No respiratory distress.  Abdominal: Soft. Bowel sounds are normal. She  exhibits no distension.  Musculoskeletal: Normal range of motion. She exhibits no edema.  For upper and lower extremities, back mobility restricted  Lymphadenopathy:    She has no cervical adenopathy.  Neurological: She is alert and oriented to person, place, and time.  Skin: Skin is warm and dry.  Has brace present, incision site has been healing well    Labs reviewed: Basic Metabolic Panel:  Recent Labs  29/52/84 1114 08/30/12 1508 09/03/12 0810  NA 137 133*  135  K 3.5 4.1 3.9  CL 99 98 98  CO2 30 27 30   GLUCOSE 115* 106* 118*  BUN 14 24* 16  CREATININE 0.68 1.11* 0.91  CALCIUM 8.5 9.5 9.6   Liver Function Tests:  Recent Labs  12/24/11 1514  AST 17  ALT 16  ALKPHOS 50  BILITOT 0.3  PROT 6.3  ALBUMIN 4.0  CBC:  Recent Labs  08/08/12 1850 08/10/12 1114 08/30/12 1508 09/03/12 0810  WBC 25.9* 11.8* 7.8 7.0  NEUTROABS 22.3*  --  4.2 3.4  HGB 13.2 11.0* 12.8 13.7  HCT 38.6 32.9* 37.9 39.6  MCV 90.6 92.4 90.2 89.4  PLT 238 165 365 308    Assessment/Plan  C.diff colitis- Will continue flagyl until 09/20/12. Loose stool today is likely from her stool softener. Will change this to only as needed for now  Thoracic fracture- s/p kyphoplasty and has a brace. Pain currently under control. To follow with orhtopedic. Change ultram to 50 mg every 8 hours for now and decrease vicodin 5/325 to 1 tab every 8 hr prn for severe pain. Will discontinue naprosyn. Brace care and wound care with dressing change. Will have her work with PT/OT, to complete rehab. Fall precautions. Continue ca-vit d supplement  HTN- bp stable at present. Continue hctz and kcl supplement. Monitor bmp  Depression- stable with current dose of paroxetine, monitor mood  GERD- on omeprazole and symptoms under control. Monitor for now  Family/ staff Communication: reviewed plan of care with patient, her daughter and nursing supervisor   Goals of care: gait stability, fall preventions and follow up on  kyphoplasty, keep chronic medical problems under control   Labs/tests ordered- cbc, cmp

## 2012-09-24 DIAGNOSIS — M40204 Unspecified kyphosis, thoracic region: Secondary | ICD-10-CM | POA: Insufficient documentation

## 2012-10-12 ENCOUNTER — Other Ambulatory Visit: Payer: Self-pay | Admitting: Geriatric Medicine

## 2012-10-12 ENCOUNTER — Encounter: Payer: Self-pay | Admitting: Nurse Practitioner

## 2012-10-12 DIAGNOSIS — N390 Urinary tract infection, site not specified: Secondary | ICD-10-CM

## 2012-10-12 DIAGNOSIS — R634 Abnormal weight loss: Secondary | ICD-10-CM

## 2012-10-12 MED ORDER — ZOLPIDEM TARTRATE 5 MG PO TABS: 5.0000 mg | ORAL_TABLET | Freq: Every evening | ORAL | Status: DC | PRN

## 2012-10-12 NOTE — Progress Notes (Signed)
  Subjective:    Patient ID: Amber Rollins, female    DOB: 09-23-1930, 79 y.o.   MRN: [id number]  HPI    Review of Systems  LABS:  10/12/12 - urine culture > 100,000  colonies of Klebsiella Pneumoniae. Urinalysis - nitrites positive, large leuk esterase, many bacteria, >50 wbc. Objective:   Physical Exam        Assessment & Plan:  1) Cipro 500mg  po q 12 hrs x 10 days. 2) Florastor 250mg  one po bid x 12 days.  This encounter was created in error - please disregard.

## 2012-10-13 ENCOUNTER — Encounter: Payer: Self-pay | Admitting: Nurse Practitioner

## 2012-11-15 ENCOUNTER — Other Ambulatory Visit: Payer: Self-pay | Admitting: Geriatric Medicine

## 2012-11-15 MED ORDER — ZOLPIDEM TARTRATE 5 MG PO TABS: 5.0000 mg | ORAL_TABLET | Freq: Every evening | ORAL | Status: DC | PRN

## 2012-12-21 ENCOUNTER — Non-Acute Institutional Stay (SKILLED_NURSING_FACILITY): Payer: Medicare Other | Admitting: Adult Health

## 2012-12-21 ENCOUNTER — Encounter: Payer: Self-pay | Admitting: Adult Health

## 2012-12-21 DIAGNOSIS — IMO0002 Reserved for concepts with insufficient information to code with codable children: Secondary | ICD-10-CM

## 2012-12-21 DIAGNOSIS — M40204 Unspecified kyphosis, thoracic region: Secondary | ICD-10-CM

## 2012-12-21 DIAGNOSIS — M4000 Postural kyphosis, site unspecified: Secondary | ICD-10-CM

## 2012-12-21 DIAGNOSIS — S22080S Wedge compression fracture of T11-T12 vertebra, sequela: Secondary | ICD-10-CM

## 2012-12-21 NOTE — Progress Notes (Signed)
Patient ID: Amber Rollins, female   DOB: 06/08/1930, 79 y.o.   MRN: [id number] [address]  Allergies  Allergen Reactions  . Tdap (Diphth-Acell Pertussis-Tetanus) Swelling  . Amlodipine Besylate     REACTION: edema  . Codeine     headache  . Lovastatin     REACTION: leg pain  . Penicillins     Unknown reaction  . Sertraline Hcl     REACTION: caused insomnia  . Simvastatin     REACTION: non tolerant- leg pain  . Latex Rash  . Sulfonamide Derivatives Rash     Chief Complaint  Patient presents with  . Discharge Note    HPI: she is being discharged to home after being hospitalized for a kyphoplasty procedure in April of this year. She was admitted to this faciltiy for short term rehab. She will need home health for pt/ot/cna; will need a rollator in order for her to maintain independence with her adl's such as toileting and grooming; will need a 3:1 commode. Will need prescriptions to be written.   Past Medical History  Diagnosis Date  . Hyperlipemia 05/31/1992  . Hypertension 05/31/2000  . Osteopenia 11/29/1999  . Osteoarthritis   . Pedal edema     worsened by Norvasc  . Chronic foot pain     mortons neuroma  . Plantar fasciitis   . Menopausal symptoms     vasomotor symptoms-severe  . Cystocele 01/01    neg. sx  . Hemorrhoids   . fracture fibula ? 2003    left  . Complication of anesthesia   . PONV (postoperative nausea and vomiting)   . GERD (gastroesophageal reflux disease)     Past Surgical History  Procedure Laterality Date  . Partial hysterectomy  1970    endometriosis  . Foot problems      neuromas  . Anterior repari w//surg proc  06/30/99    RSO  . History of ct      mass right adnexa, U?S by GYN-observation  . Bladder tack    . Bladder suspension    . Cataracts    . Kyphoplasty N/A 09/04/2012    Procedure: T7, T8,T12 Kyphoplasty ;  Surgeon: Clydene Fake, MD;  Location: [facility];  Service: Neurosurgery;  Laterality: N/A;  thoracic  seven,eight, and twelve     VITAL SIGNS BP 148/72  Pulse 70  Ht 5\' 3"  (1.6 m)  Wt 124 lb 12.8 oz (56.609 kg)  BMI 22.11 kg/m2   Patient's Medications  New Prescriptions   No medications on file  Previous Medications   CALCIUM-VITAMIN D (OSCAL WITH D) 500-200 MG-UNIT PER TABLET    Take 1 tablet by mouth daily.   DOCUSATE SODIUM (COLACE) 100 MG CAPSULE    Take 100 mg by mouth 2 (two) times daily.   FEXOFENADINE (ALLEGRA) 180 MG TABLET    Take 180 mg by mouth daily as needed (allergies).   FLUTICASONE (FLONASE) 50 MCG/ACT NASAL SPRAY    Place 2 sprays into the nose daily. 1-2 sprays each nostril daily.   HYDROCHLOROTHIAZIDE (HYDRODIURIL) 25 MG TABLET    Take 50 mg by mouth every morning.   MULTIPLE VITAMINS-MINERALS (CENTRUM CARDIO) TABS    Take 1 tablet by mouth daily.   OMEGA-3 FATTY ACIDS (FISH OIL) 1200 MG CAPS    Take 2 by mouth daily    OMEPRAZOLE (PRILOSEC) 20 MG CAPSULE    Take 1 capsule (20 mg total) by mouth 2 (two) times daily.  PAROXETINE (PAXIL) 10 MG TABLET    Take 10 mg by mouth every morning.   POTASSIUM CHLORIDE (K-DUR,KLOR-CON) 10 MEQ TABLET    Take 20 mEq by mouth daily.   Modified Medications   Modified Medication Previous Medication   TRAMADOL (ULTRAM) 50 MG TABLET traMADol (ULTRAM) 50 MG tablet      Take 100 mg by mouth every 8 (eight) hours as needed.    Take 2 tablets (100 mg total) by mouth every 6 (six) hours.   ZOLPIDEM (AMBIEN) 5 MG TABLET zolpidem (AMBIEN) 5 MG tablet      Take 5 mg by mouth at bedtime.    Take 1 tablet (5 mg total) by mouth at bedtime as needed for sleep.  Discontinued Medications   HYDROCODONE-ACETAMINOPHEN (NORCO/VICODIN) 5-325 MG PER TABLET    Take 1-2 tablets by mouth every 4 (four) hours as needed for pain (pt can take up to 2 tabs for moderate to severe pain).   NAPROXEN (NAPROSYN) 500 MG TABLET    Take 1 tablet (500 mg total) by mouth 2 (two) times daily with a meal.    SIGNIFICANT DIAGNOSTIC EXAMS  10-19-12:wbc 6.0; hgb 13.5;  hct 40.2; mcv 92.0; plt 317; glucose 109; bun 12;creat 0.82;k+3.4na++135  Chol 332; ldl 252; trig 216 10-26-12: k+ 3.9 11-03-12: urine culture: e-coli: macrobid   Review of Systems  Constitutional: Negative for malaise/fatigue.  Respiratory: Negative for cough and shortness of breath.   Cardiovascular: Negative for chest pain and palpitations.  Genitourinary: Negative for dysuria.  Musculoskeletal: Negative for myalgias, back pain and joint pain.  Skin: Negative.   Neurological: Negative for dizziness and headaches.  Psychiatric/Behavioral: Negative for depression. The patient does not have insomnia.    Physical Exam  Constitutional: She is oriented to person, place, and time.  thin  Neck: Neck supple. No thyromegaly present.  Cardiovascular: Normal rate, regular rhythm and intact distal pulses.   Respiratory: Effort normal and breath sounds normal. No respiratory distress.  GI: Soft. Bowel sounds are normal. She exhibits no distension.  Musculoskeletal: Normal range of motion. She exhibits no edema.  Uses rollator for independence with ambulation; uses her back brace  Neurological: She is alert and oriented to person, place, and time.  Skin: Skin is warm and dry.  Psychiatric: She has a normal mood and affect.      ASSESSMENT/ PLAN:  Will discharge her to home she will need home health for pt/ot/cna. She will need a rollator in order to allow her to ambulate and to participate in her adls independently this could not be achieved without this assistive device. She will need 3:1 bedside commode for toileting needs.   Tine spent with patient 45 minutes

## 2012-12-27 ENCOUNTER — Encounter: Payer: Self-pay | Admitting: Family Medicine

## 2012-12-27 ENCOUNTER — Ambulatory Visit (INDEPENDENT_AMBULATORY_CARE_PROVIDER_SITE_OTHER): Payer: Medicare Other | Admitting: Family Medicine

## 2012-12-27 VITALS — BP 130/76 | HR 90 | Temp 97.9°F | Ht 63.0 in | Wt 123.5 lb

## 2012-12-27 DIAGNOSIS — S22080S Wedge compression fracture of T11-T12 vertebra, sequela: Secondary | ICD-10-CM

## 2012-12-27 DIAGNOSIS — F329 Major depressive disorder, single episode, unspecified: Secondary | ICD-10-CM

## 2012-12-27 DIAGNOSIS — IMO0002 Reserved for concepts with insufficient information to code with codable children: Secondary | ICD-10-CM

## 2012-12-27 DIAGNOSIS — S2232XS Fracture of one rib, left side, sequela: Secondary | ICD-10-CM

## 2012-12-27 DIAGNOSIS — M949 Disorder of cartilage, unspecified: Secondary | ICD-10-CM

## 2012-12-27 DIAGNOSIS — N390 Urinary tract infection, site not specified: Secondary | ICD-10-CM

## 2012-12-27 DIAGNOSIS — R32 Unspecified urinary incontinence: Secondary | ICD-10-CM | POA: Insufficient documentation

## 2012-12-27 DIAGNOSIS — M1990 Unspecified osteoarthritis, unspecified site: Secondary | ICD-10-CM

## 2012-12-27 DIAGNOSIS — S22069S Unspecified fracture of T7-T8 vertebra, sequela: Secondary | ICD-10-CM

## 2012-12-27 DIAGNOSIS — M899 Disorder of bone, unspecified: Secondary | ICD-10-CM

## 2012-12-27 DIAGNOSIS — S2220XS Unspecified fracture of sternum, sequela: Secondary | ICD-10-CM

## 2012-12-27 LAB — POCT URINALYSIS DIPSTICK: Urobilinogen, UA: 0.2

## 2012-12-27 MED ORDER — PAROXETINE HCL 20 MG PO TABS: 20.0000 mg | ORAL_TABLET | ORAL | Status: DC

## 2012-12-27 NOTE — Progress Notes (Signed)
Subjective:    Patient ID: Amber Rollins, female    DOB: 1930-12-12, 79 y.o.   MRN: [id number]  HPI Here for hospital and nursing home follow up  08/08/12- she fell through the floor of her attic- about 8 feet  Fx T7, T8, T12 - kyphoplasty -(helped the pain a lot)- after considering surg with Dr Kathaleen Bury and a rib   Slow recovery  tx for c diff diarrhea and 3 utis also -all treated   Now she is home- very happy about that (though she liked [employer]) Will be sending out PT to her home  Has not tried showering at home yet- getting her home equipped with what she needs  She gets shaky and tired very quickly  Some nausea as well - she takes tramadol bid (has hydrocodone but not needing it now)  In a brace -- some pain every day with or without it - mid back  Is getting out of that -uses on and off and still spends a good part of the day in bed    Is able to walk short distances with her rolling walker   Has f/u with Dr Blanche East end of august   Has lost 7 lb since the accident  Her appetite is not great - was getting ensure twice daily  Getting better -now she is eating more at home  Moodiness - comes and goes with tearfulness  Her family does worry about depression - had days of hopelessness  Is on paxil 10 mg   Patient Active Problem List   Diagnosis Date Noted  . Urinary incontinence 12/27/2012  . Depression 09/24/2012  . Thoracic kyphosis 09/24/2012  . UTI (urinary tract infection) 08/12/2012  . Acute blood loss anemia 08/10/2012  . Fall 08/09/2012  . Sternal fracture 08/09/2012  . Left rib fracture 08/09/2012  . T8 vertebral fracture 08/09/2012  . T12 compression fracture 08/09/2012  . Laceration of lower leg 08/09/2012  . Hemorrhoid 12/24/2011  . Dysuria 12/24/2011  . Low back pain 12/24/2011  . Hematuria 12/24/2011  . Sinusitis, bacterial 07/26/2011  . Gastroenteritis 10/06/2010  . Hypokalemia 10/06/2010  . HEMATURIA UNSPECIFIED 10/02/2009  .  NIGHT SWEATS 10/02/2009  . GERD 06/18/2009  . INCONTINENCE, URGE 06/18/2009  . FATIGUE 03/12/2009  . HYPOKALEMIA 10/08/2008  . MENOPAUSAL SYNDROME 01/22/2008  . EDEMA 07/19/2007  . ANXIETY STATE, UNSPECIFIED 06/21/2007  . REACTION, ACUTE STRESS W/EMOTIONAL DSTURB 03/06/2007  . OVARIAN MASS 03/06/2007  . FOOT PAIN, CHRONIC 03/06/2007  . ALLERGIC RHINITIS, SEASONAL 09/12/2006  . IRRITABLE BOWEL SYNDROME 09/12/2006  . FIBROCYSTIC BREAST DISEASE 09/12/2006  . OSTEOARTHRITIS 09/12/2006  . HEMORRHOIDS, HX OF 09/12/2006  . HYSTERECTOMY, HX OF 09/12/2006  . HYPERTENSION 05/31/2000  . OSTEOPENIA 11/29/1999  . HYPERLIPIDEMIA 05/31/1992   Past Medical History  Diagnosis Date  . Hyperlipemia 05/31/1992  . Hypertension 05/31/2000  . Osteopenia 11/29/1999  . Osteoarthritis   . Pedal edema     worsened by Norvasc  . Chronic foot pain     mortons neuroma  . Plantar fasciitis   . Menopausal symptoms     vasomotor symptoms-severe  . Cystocele 01/01    neg. sx  . Hemorrhoids   . fracture fibula ? 2003    left  . Complication of anesthesia   . PONV (postoperative nausea and vomiting)   . GERD (gastroesophageal reflux disease)    Past Surgical History  Procedure Laterality Date  . Partial hysterectomy  1970    endometriosis  .  Foot problems      neuromas  . Anterior repari w//surg proc  06/30/99    RSO  . History of ct      mass right adnexa, U?S by GYN-observation  . Bladder tack    . Bladder suspension    . Cataracts    . Kyphoplasty N/A 09/04/2012    Procedure: T7, T8,T12 Kyphoplasty ;  Surgeon: Clydene Fake, MD;  Location: [facility];  Service: Neurosurgery;  Laterality: N/A;  thoracic seven,eight, and twelve    History  Substance Use Topics  . Smoking status: Never Smoker   . Smokeless tobacco: Never Used  . Alcohol Use: No   Family History  Problem Relation Age of Onset  . Diabetes Mother   . Heart disease Mother     pacemaker  . Hypertension Mother   . COPD  Father   . Diabetes Sister   . Diabetes Brother   . Mental illness Son     anxiety-mental health problems  . Diabetes Brother   . Diabetes Sister   . Diabetes Sister   . Hypertension Other    Allergies  Allergen Reactions  . Tdap (Diphth-Acell Pertussis-Tetanus) Swelling  . Amlodipine Besylate     REACTION: edema  . Codeine     headache  . Lovastatin     REACTION: leg pain  . Penicillins     Unknown reaction  . Sertraline Hcl     REACTION: caused insomnia  . Simvastatin     REACTION: non tolerant- leg pain  . Latex Rash  . Sulfonamide Derivatives Rash   Current Outpatient Prescriptions on File Prior to Visit  Medication Sig Dispense Refill  . calcium-vitamin D (OSCAL WITH D) 500-200 MG-UNIT per tablet Take 1 tablet by mouth daily.      Marland Kitchen docusate sodium (COLACE) 100 MG capsule Take 100 mg by mouth 2 (two) times daily.      . fexofenadine (ALLEGRA) 180 MG tablet Take 180 mg by mouth daily as needed (allergies).      . fluticasone (FLONASE) 50 MCG/ACT nasal spray Place 2 sprays into the nose daily. 1-2 sprays each nostril daily.      . hydrochlorothiazide (HYDRODIURIL) 25 MG tablet Take 50 mg by mouth every morning.      . Multiple Vitamins-Minerals (CENTRUM CARDIO) TABS Take 1 tablet by mouth daily.      . Omega-3 Fatty Acids (FISH OIL) 1200 MG CAPS Take 2 by mouth daily       . omeprazole (PRILOSEC) 20 MG capsule Take 1 capsule (20 mg total) by mouth 2 (two) times daily.  180 capsule  3  . potassium chloride (K-DUR,KLOR-CON) 10 MEQ tablet Take 20 mEq by mouth daily.       . traMADol (ULTRAM) 50 MG tablet Take 100 mg by mouth every 8 (eight) hours as needed.      . zolpidem (AMBIEN) 5 MG tablet Take 5 mg by mouth at bedtime.      . [DISCONTINUED] potassium chloride (K-DUR) 10 MEQ tablet Take 1 tablet (10 mEq total) by mouth daily.  30 tablet  3   No current facility-administered medications on file prior to visit.    Review of Systems Review of Systems  Constitutional:  Negative for fever, appetite change, fatigue and unexpected weight change.  Eyes: Negative for pain and visual disturbance.  Respiratory: Negative for cough and shortness of breath.   Cardiovascular: Negative for cp or palpitations    Gastrointestinal: Negative for nausea,  diarrhea and constipation.  Genitourinary: Negative for urgency and frequency.  Skin: Negative for pallor or rash   MSK pos for back pain that is gradually improving , neg for cp  Neurological: Negative for weakness, light-headedness, numbness and headaches.  Hematological: Negative for adenopathy. Does not bruise/bleed easily.  Psychiatric/Behavioral: Negative for dysphoric mood. The patient is not nervous/anxious.         Objective:   Physical Exam  Constitutional: She appears well-developed and well-nourished. No distress.  Frail appearing elderly female wearing a back brace  HENT:  Head: Normocephalic and atraumatic.  Mouth/Throat: Oropharynx is clear and moist.  Eyes: Conjunctivae and EOM are normal. Pupils are equal, round, and reactive to light. Right eye exhibits no discharge. Left eye exhibits no discharge. No scleral icterus.  Neck: Normal range of motion. Neck supple. No JVD present. Carotid bruit is not present. No thyromegaly present.  Cardiovascular: Normal rate, regular rhythm, normal heart sounds and intact distal pulses.  Exam reveals no gallop.   Pulmonary/Chest: Effort normal and breath sounds normal. No respiratory distress. She has no wheezes. She has no rales.  No crackles  Abdominal: She exhibits no abdominal bruit.  Musculoskeletal: She exhibits tenderness. She exhibits no edema.  Mild TS tenderness with limited rom  Lymphadenopathy:    She has no cervical adenopathy.  Neurological: She is alert. She has normal reflexes. No cranial nerve deficit. She exhibits normal muscle tone. Coordination normal.  Skin: Skin is warm and dry. No rash noted. No pallor.  Psychiatric: Her speech is normal.  Thought content normal. Her affect is blunt. She is slowed. Thought content is not paranoid. Cognition and memory are normal. She exhibits a depressed mood. She expresses no homicidal and no suicidal ideation.  Seems down in general Answers questions appropriately          Assessment & Plan:

## 2012-12-27 NOTE — Patient Instructions (Addendum)
Increase paxil to 20 mg daily  Keep doing your physical therapy  We will refer you for bone density test at check out  Leave a urine specimen on the way out  Follow up with me about 2 weeks after bone density test

## 2012-12-27 NOTE — Assessment & Plan Note (Signed)
ua is pos for blood today- will send for cx

## 2012-12-28 NOTE — Assessment & Plan Note (Signed)
No symptoms presently Healing  From fall of 8 ft

## 2012-12-28 NOTE — Assessment & Plan Note (Signed)
Will tx uti if she still has one-pend cx Disc further at f/u  Pt has already had a bladder tack

## 2012-12-28 NOTE — Assessment & Plan Note (Signed)
From fall of 8 ft hosp records and rehab records rev in detail  Healing and no longer symptomatic  Doing PT for this and TS fx

## 2012-12-28 NOTE — Assessment & Plan Note (Signed)
Family thinks this may be worse from recent accident/ rehab/hosp/ return home and physical limitations  She is frustrated but adjusting  Goal is to keep her as active as possible Will inc paxil to 20 to help dep with anx Disc poss side eff and expectations, disc stressors and coping mechanisms as well  F/u planned >25 min spent with face to face with patient, >50% counseling and/or coordinating care

## 2012-12-28 NOTE — Assessment & Plan Note (Signed)
Schedule dexa  Need to disc tx in light of TS and sternal and rib fx recently  Will f/u after dexa

## 2012-12-28 NOTE — Assessment & Plan Note (Signed)
See assessment for T8 fx

## 2012-12-28 NOTE — Assessment & Plan Note (Signed)
From a fall of 8 feet tx with kyphoplasty - in brace intermittently Will continue with PT at home and f/u with Dr Phoebe Perch

## 2012-12-29 ENCOUNTER — Ambulatory Visit (INDEPENDENT_AMBULATORY_CARE_PROVIDER_SITE_OTHER)
Admission: RE | Admit: 2012-12-29 | Discharge: 2012-12-29 | Disposition: A | Discharge status: Home / Self Care | Payer: Medicare Other | Source: Ambulatory Visit | Attending: Family Medicine | Admitting: Family Medicine

## 2012-12-29 ENCOUNTER — Telehealth: Payer: Self-pay

## 2012-12-29 DIAGNOSIS — S22080S Wedge compression fracture of T11-T12 vertebra, sequela: Secondary | ICD-10-CM

## 2012-12-29 DIAGNOSIS — IMO0002 Reserved for concepts with insufficient information to code with codable children: Secondary | ICD-10-CM

## 2012-12-29 DIAGNOSIS — M949 Disorder of cartilage, unspecified: Secondary | ICD-10-CM

## 2012-12-29 DIAGNOSIS — M899 Disorder of bone, unspecified: Secondary | ICD-10-CM

## 2012-12-29 LAB — URINE CULTURE: Colony Count: NO GROWTH

## 2012-12-29 NOTE — Telephone Encounter (Signed)
[name] with [name] left v/m requesting verbal order for home health PT for 1 x week this week for evaluation and 2 x a week for 4 weeks.Please advise.

## 2012-12-29 NOTE — Telephone Encounter (Signed)
Please verbally ok that  

## 2012-12-29 NOTE — Telephone Encounter (Signed)
Orders given to [facility] with [name].

## 2013-01-02 ENCOUNTER — Telehealth: Payer: Self-pay

## 2013-01-02 NOTE — Telephone Encounter (Signed)
Junious Dresser OT with [name] left v/m requesting verbal orders for home health OT and home health aide 3 x a week for 4 weeks.

## 2013-01-02 NOTE — Telephone Encounter (Signed)
Left voicemail on [name]'s phone giving the verbal orders

## 2013-01-02 NOTE — Telephone Encounter (Signed)
Please ok those verbal orders  

## 2013-01-09 DIAGNOSIS — IMO0002 Reserved for concepts with insufficient information to code with codable children: Secondary | ICD-10-CM

## 2013-01-09 DIAGNOSIS — F329 Major depressive disorder, single episode, unspecified: Secondary | ICD-10-CM

## 2013-01-09 DIAGNOSIS — R269 Unspecified abnormalities of gait and mobility: Secondary | ICD-10-CM

## 2013-01-09 DIAGNOSIS — M1990 Unspecified osteoarthritis, unspecified site: Secondary | ICD-10-CM

## 2013-01-09 DIAGNOSIS — F3289 Other specified depressive episodes: Secondary | ICD-10-CM

## 2013-01-10 ENCOUNTER — Ambulatory Visit (INDEPENDENT_AMBULATORY_CARE_PROVIDER_SITE_OTHER): Payer: Medicare Other | Admitting: Family Medicine

## 2013-01-10 ENCOUNTER — Encounter: Payer: Self-pay | Admitting: Family Medicine

## 2013-01-10 VITALS — BP 130/78 | HR 75 | Temp 97.5°F | Ht 63.0 in | Wt 120.0 lb

## 2013-01-10 DIAGNOSIS — N390 Urinary tract infection, site not specified: Secondary | ICD-10-CM

## 2013-01-10 DIAGNOSIS — F3289 Other specified depressive episodes: Secondary | ICD-10-CM

## 2013-01-10 DIAGNOSIS — F329 Major depressive disorder, single episode, unspecified: Secondary | ICD-10-CM

## 2013-01-10 DIAGNOSIS — R319 Hematuria, unspecified: Secondary | ICD-10-CM

## 2013-01-10 LAB — POCT URINALYSIS DIPSTICK
Nitrite, UA: NEGATIVE
Spec Grav, UA: 1.01
pH, UA: 7

## 2013-01-10 MED ORDER — ZOLPIDEM TARTRATE 5 MG PO TABS: 5.0000 mg | ORAL_TABLET | Freq: Every day | ORAL | Status: DC

## 2013-01-10 MED ORDER — HYDROCHLOROTHIAZIDE 25 MG PO TABS: 50.0000 mg | ORAL_TABLET | Freq: Every morning | ORAL | Status: DC

## 2013-01-10 MED ORDER — POTASSIUM CHLORIDE CRYS ER 10 MEQ PO TBCR: 10.0000 meq | EXTENDED_RELEASE_TABLET | Freq: Every day | ORAL | Status: DC

## 2013-01-10 MED ORDER — PAROXETINE HCL 10 MG PO TABS: 10.0000 mg | ORAL_TABLET | ORAL | Status: DC

## 2013-01-10 NOTE — Patient Instructions (Signed)
Decrease paxil back to 10 mg daily since the 20 is not helping  Follow up with Dr Phoebe Perch Go back to your original potassium dose  When back pain is improved - then we can discuss pros and cons of medicine for osteoporosis -but your bone density test is much better than expected  Make sure to take your calcium and vitamin D

## 2013-01-10 NOTE — Progress Notes (Signed)
Subjective:    Patient ID: Amber Rollins, female    DOB: [birth year], 79 y.o.   MRN: [id number]  HPI Here for f/u of depression / mood disorder and also to re check urine  Is having some pain for her pain -- takes ultram when she needs it  Not constipating  Is out of the brace - sees her ortho at the end of the month No falls   Last visit - ua had dip pos for blood cx was neg Re check today- clear on micro  Wt is down 3 lb with bmi of 21 Is eating regularly  Inc her paxil to 20 mg daily for mood She does not think she noticed any improvement in her symptoms  She feels worse in the ams - is more fuzzy/ harder to keep her balance (almost runs into the wall)   dexa is better than expected Spine score was nl  FN -1.7 Will see Dr Phoebe Perch upcoming  I suspect her compression fractures were in fact from large impact (not fragility fractures)- though deg changes in the spine can always falsely elevate score a bit  Will disc med for bone density after her f/u with spine specialist  ? Considering miacalcin as it may help with pain      Chemistry      Component Value Date/Time   NA 135 09/03/2012 0810   K 3.9 09/03/2012 0810   CL 98 09/03/2012 0810   CO2 30 09/03/2012 0810   BUN 16 09/03/2012 0810   CREATININE 0.91 09/03/2012 0810   CREATININE 1.03 12/24/2011 1514      Component Value Date/Time   CALCIUM 9.6 09/03/2012 0810   ALKPHOS 50 12/24/2011 1514   AST 17 12/24/2011 1514   ALT 16 12/24/2011 1514   BILITOT 0.3 12/24/2011 1514      Lab Results  Component Value Date   WBC 7.0 09/03/2012   HGB 13.7 09/03/2012   HCT 39.6 09/03/2012   MCV 89.4 09/03/2012   PLT 308 09/03/2012    Patient Active Problem List   Diagnosis Date Noted  . Urinary incontinence 12/27/2012  . Depression 09/24/2012  . Thoracic kyphosis 09/24/2012  . UTI (urinary tract infection) 08/12/2012  . Acute blood loss anemia 08/10/2012  . Fall 08/09/2012  . Sternal fracture 08/09/2012  . Left rib fracture 08/09/2012  .  T8 vertebral fracture 08/09/2012  . T12 compression fracture 08/09/2012  . Laceration of lower leg 08/09/2012  . Hemorrhoid 12/24/2011  . Dysuria 12/24/2011  . Low back pain 12/24/2011  . Hematuria 12/24/2011  . Sinusitis, bacterial 07/26/2011  . Gastroenteritis 10/06/2010  . Hypokalemia 10/06/2010  . HEMATURIA UNSPECIFIED 10/02/2009  . NIGHT SWEATS 10/02/2009  . GERD 06/18/2009  . INCONTINENCE, URGE 06/18/2009  . FATIGUE 03/12/2009  . HYPOKALEMIA 10/08/2008  . MENOPAUSAL SYNDROME 01/22/2008  . EDEMA 07/19/2007  . ANXIETY STATE, UNSPECIFIED 06/21/2007  . REACTION, ACUTE STRESS W/EMOTIONAL DSTURB 03/06/2007  . OVARIAN MASS 03/06/2007  . FOOT PAIN, CHRONIC 03/06/2007  . ALLERGIC RHINITIS, SEASONAL 09/12/2006  . IRRITABLE BOWEL SYNDROME 09/12/2006  . FIBROCYSTIC BREAST DISEASE 09/12/2006  . OSTEOARTHRITIS 09/12/2006  . HEMORRHOIDS, HX OF 09/12/2006  . HYSTERECTOMY, HX OF 09/12/2006  . HYPERTENSION 05/31/2000  . OSTEOPENIA 11/29/1999  . HYPERLIPIDEMIA 05/31/1992   Past Medical History  Diagnosis Date  . Hyperlipemia 05/31/1992  . Hypertension 05/31/2000  . Osteopenia 11/29/1999  . Osteoarthritis   . Pedal edema     worsened by Norvasc  . Chronic foot  pain     mortons neuroma  . Plantar fasciitis   . Menopausal symptoms     vasomotor symptoms-severe  . Cystocele 01/01    neg. sx  . Hemorrhoids   . fracture fibula ? 2003    left  . Complication of anesthesia   . PONV (postoperative nausea and vomiting)   . GERD (gastroesophageal reflux disease)    Past Surgical History  Procedure Laterality Date  . Partial hysterectomy  1970    endometriosis  . Foot problems      neuromas  . Anterior repari w//surg proc  06/30/99    RSO  . History of ct      mass right adnexa, U?S by GYN-observation  . Bladder tack    . Bladder suspension    . Cataracts    . Kyphoplasty N/A 09/04/2012    Procedure: T7, T8,T12 Kyphoplasty ;  Surgeon: Clydene Fake, MD;  Location: [facility];  Service: Neurosurgery;  Laterality: N/A;  thoracic seven,eight, and twelve    History  Substance Use Topics  . Smoking status: Never Smoker   . Smokeless tobacco: Never Used  . Alcohol Use: No   Family History  Problem Relation Age of Onset  . Diabetes Mother   . Heart disease Mother     pacemaker  . Hypertension Mother   . COPD Father   . Diabetes Sister   . Diabetes Brother   . Mental illness Son     anxiety-mental health problems  . Diabetes Brother   . Diabetes Sister   . Diabetes Sister   . Hypertension Other    Allergies  Allergen Reactions  . Tdap [Diphth-Acell Pertussis-Tetanus] Swelling  . Amlodipine Besylate     REACTION: edema  . Codeine     headache  . Lovastatin     REACTION: leg pain  . Penicillins     Unknown reaction  . Sertraline Hcl     REACTION: caused insomnia  . Simvastatin     REACTION: non tolerant- leg pain  . Latex Rash  . Sulfonamide Derivatives Rash   Current Outpatient Prescriptions on File Prior to Visit  Medication Sig Dispense Refill  . calcium-vitamin D (OSCAL WITH D) 500-200 MG-UNIT per tablet Take 1 tablet by mouth daily.      . clotrimazole (LOTRIMIN) 1 % cream Apply topically. Clean under breast with /ns, apply cream BID until rash/fungal resolved      . docusate sodium (COLACE) 100 MG capsule Take 100 mg by mouth 2 (two) times daily.      . fexofenadine (ALLEGRA) 180 MG tablet Take 180 mg by mouth daily as needed (allergies).      . fluticasone (FLONASE) 50 MCG/ACT nasal spray Place 2 sprays into the nose daily. 1-2 sprays each nostril daily.      Marland Kitchen HYDROcodone-acetaminophen (NORCO/VICODIN) 5-325 MG per tablet Take 1 tablet by mouth as needed for pain.      . Multiple Vitamins-Minerals (CENTRUM CARDIO) TABS Take 1 tablet by mouth daily.      . Multiple Vitamins-Minerals (CERTA-VITE PO) Take 1 tablet by mouth daily.      . Omega-3 Fatty Acids (FISH OIL) 1200 MG CAPS Take 2 by mouth daily       . omeprazole (PRILOSEC) 20 MG  capsule Take 1 capsule (20 mg total) by mouth 2 (two) times daily.  180 capsule  3  . Oyster Shell 500 MG TABS Take 1 tablet by mouth daily.      Marland Kitchen  traMADol (ULTRAM) 50 MG tablet Take 100 mg by mouth every 8 (eight) hours as needed.      . [DISCONTINUED] potassium chloride (K-DUR) 10 MEQ tablet Take 1 tablet (10 mEq total) by mouth daily.  30 tablet  3   No current facility-administered medications on file prior to visit.     Review of Systems Review of Systems  Constitutional: Negative for fever, appetite change, fatigue and unexpected weight change.  Eyes: Negative for pain and visual disturbance.  Respiratory: Negative for cough and shortness of breath.   Cardiovascular: Negative for cp or palpitations    Gastrointestinal: Negative for nausea, diarrhea and constipation.  Genitourinary: Negative for urgency and frequency.  Skin: Negative for pallor or rash   MSK pos for back pain worse after coming out of brace (she thinks it may be muscular) Neurological: Negative for weakness, light-headedness, numbness and headaches.  Hematological: Negative for adenopathy. Does not bruise/bleed easily.  Psychiatric/Behavioral: Negative for dysphoric mood. The patient is nervous/anxious.  - no imp with the paxil increase       Objective:   Physical Exam  Constitutional: She appears well-developed and well-nourished. No distress.  Frail appearing elderly female  HENT:  Head: Normocephalic and atraumatic.  Mouth/Throat: No oropharyngeal exudate.  Eyes: Conjunctivae and EOM are normal. Pupils are equal, round, and reactive to light.  Neck: Normal range of motion. Carotid bruit is not present.  Cardiovascular: Normal rate and regular rhythm.   Pulmonary/Chest: Effort normal. No respiratory distress.  Abdominal: Soft. Bowel sounds are normal. She exhibits no distension and no mass. There is no tenderness.  Musculoskeletal: She exhibits no edema.  Out of back brace Slow gait with walker   Neurological: She is alert. She has normal reflexes. No cranial nerve deficit. She exhibits normal muscle tone. Coordination normal.  Skin: No pallor.  Psychiatric:  Pt seems more frustrated today          Assessment & Plan:

## 2013-01-11 LAB — POCT UA - MICROSCOPIC ONLY
Bacteria, U Microscopic: 0
Casts, Ur, LPF, POC: 0
Crystals, Ur, HPF, POC: 0
RBC, urine, microscopic: 0

## 2013-01-11 NOTE — Assessment & Plan Note (Signed)
Re check - ua clear on micro No symptoms

## 2013-01-11 NOTE — Assessment & Plan Note (Signed)
Neg on micro today

## 2013-01-11 NOTE — Assessment & Plan Note (Signed)
With anxiety Worsened with inc in paxil and also other side eff (sedation and balance issues in am) Will drop dose back to 10  Watch carefully  Pt adjusting to life at home again

## 2013-01-18 ENCOUNTER — Telehealth: Payer: Self-pay

## 2013-01-18 NOTE — Telephone Encounter (Signed)
Please verbally ok that  

## 2013-01-18 NOTE — Telephone Encounter (Signed)
Erin PT with [name] left v/m requesting verbal order for home health PT 2 x a week for 4 weeks for pain mgt, home safety,balance and gait training.Please advise.

## 2013-01-19 NOTE — Telephone Encounter (Signed)
Left voicemail on Amber Rollins's phone letting her know Dr. Milinda Antis did give there verbal orders for PT 2 x a week for 4 weeks for pain mgt, home safety,balance and gait training

## 2013-01-22 NOTE — Progress Notes (Signed)
This encounter was created in error - please disregard.

## 2013-01-26 ENCOUNTER — Telehealth: Payer: Self-pay

## 2013-01-26 NOTE — Telephone Encounter (Signed)
Junious Dresser OT with [name] left v/m requesting verbal order to extend home health aid assistance for 1 x a week for 3 weeks and extend PT  Home health 2 x a week for 3 weeks. [name] request cb.

## 2013-01-26 NOTE — Telephone Encounter (Signed)
Go ahead and verbally ok that, thanks

## 2013-01-30 NOTE — Telephone Encounter (Signed)
Left voicemail giving [name] the verbal orders  

## 2013-02-08 ENCOUNTER — Ambulatory Visit (INDEPENDENT_AMBULATORY_CARE_PROVIDER_SITE_OTHER): Payer: Medicare Other | Admitting: *Deleted

## 2013-02-08 DIAGNOSIS — Z23 Encounter for immunization: Secondary | ICD-10-CM

## 2013-03-12 ENCOUNTER — Other Ambulatory Visit: Payer: Self-pay

## 2013-03-12 MED ORDER — OMEPRAZOLE 20 MG PO CPDR: 20.0000 mg | DELAYED_RELEASE_CAPSULE | Freq: Two times a day (BID) | ORAL | Status: DC

## 2013-03-12 NOTE — Telephone Encounter (Signed)
Pt left v/m requesting refill omeprazole to [facility]; pt advised done.

## 2013-04-11 ENCOUNTER — Encounter: Payer: Self-pay | Admitting: Family Medicine

## 2013-04-11 ENCOUNTER — Ambulatory Visit (INDEPENDENT_AMBULATORY_CARE_PROVIDER_SITE_OTHER): Payer: Medicare Other | Admitting: Family Medicine

## 2013-04-11 VITALS — BP 140/70 | HR 74 | Temp 97.8°F | Ht 63.0 in | Wt 124.8 lb

## 2013-04-11 DIAGNOSIS — R42 Dizziness and giddiness: Secondary | ICD-10-CM

## 2013-04-11 DIAGNOSIS — M546 Pain in thoracic spine: Secondary | ICD-10-CM | POA: Insufficient documentation

## 2013-04-11 DIAGNOSIS — R2689 Other abnormalities of gait and mobility: Secondary | ICD-10-CM | POA: Insufficient documentation

## 2013-04-11 NOTE — Patient Instructions (Signed)
Tylenol is ok for pain - you can take 2 of the 500 mg pills every 4-6 hours as needed Your exam is reassuring Use your walker if you feel dizzy  Labs today for dizziness Make an appt with Dr Phoebe Perch to discuss your back pain

## 2013-04-11 NOTE — Progress Notes (Signed)
Pre-visit discussion using our clinic review tool. No additional management support is needed unless otherwise documented below in the visit note.  

## 2013-04-11 NOTE — Progress Notes (Signed)
Subjective:    Patient ID: [name], female    DOB: 19-Nov-1930, 79 y.o.   MRN: [id number]  HPI Here with dizziness and back pain   Started having dizzy spells 1-2 weeks ago  Light headed/dizzy  Almost fall - but she catches herself  Relatively brief - just a few minutes  Sometimes it just comes on with no change in position or activity  She does get up slowly however  No dizziness to turn over in bed   No stroke symptoms at all  She has chronic nasal congestion/ allergies  Sees Dr [place]as  Samples of zyrtec (dizziness started before then   Only takes 1/2 [place] now- this works well for her    Has a life alert button also - that makes her feel more secure    Chronic back pain after her fractures  Last saw Dr Phoebe Perch - in Sept - and she did express that she is in a lot of pain  She will make an appt   Goes to PT - and balance is off in general-? If from past fall or age  Patient Active Problem List   Diagnosis Date Noted  . Urinary incontinence 12/27/2012  . Depression 09/24/2012  . Thoracic kyphosis 09/24/2012  . UTI (urinary tract infection) 08/12/2012  . Acute blood loss anemia 08/10/2012  . Fall 08/09/2012  . Sternal fracture 08/09/2012  . Left rib fracture 08/09/2012  . T8 vertebral fracture 08/09/2012  . T12 compression fracture 08/09/2012  . Laceration of lower leg 08/09/2012  . Hemorrhoid 12/24/2011  . Dysuria 12/24/2011  . Low back pain 12/24/2011  . Sinusitis, bacterial 07/26/2011  . Gastroenteritis 10/06/2010  . Hypokalemia 10/06/2010  . HEMATURIA UNSPECIFIED 10/02/2009  . NIGHT SWEATS 10/02/2009  . GERD 06/18/2009  . INCONTINENCE, URGE 06/18/2009  . FATIGUE 03/12/2009  . HYPOKALEMIA 10/08/2008  . MENOPAUSAL SYNDROME 01/22/2008  . EDEMA 07/19/2007  . ANXIETY STATE, UNSPECIFIED 06/21/2007  . REACTION, ACUTE STRESS W/EMOTIONAL DSTURB 03/06/2007  . OVARIAN MASS 03/06/2007  . FOOT PAIN, CHRONIC 03/06/2007  . ALLERGIC RHINITIS, SEASONAL  09/12/2006  . IRRITABLE BOWEL SYNDROME 09/12/2006  . FIBROCYSTIC BREAST DISEASE 09/12/2006  . OSTEOARTHRITIS 09/12/2006  . HEMORRHOIDS, HX OF 09/12/2006  . HYSTERECTOMY, HX OF 09/12/2006  . HYPERTENSION 05/31/2000  . OSTEOPENIA 11/29/1999  . HYPERLIPIDEMIA 05/31/1992   Past Medical History  Diagnosis Date  . Hyperlipemia 05/31/1992  . Hypertension 05/31/2000  . Osteopenia 11/29/1999  . Osteoarthritis   . Pedal edema     worsened by Norvasc  . Chronic foot pain     mortons neuroma  . Plantar fasciitis   . Menopausal symptoms     vasomotor symptoms-severe  . Cystocele 01/01    neg. sx  . Hemorrhoids   . fracture fibula ? 2003    left  . Complication of anesthesia   . PONV (postoperative nausea and vomiting)   . GERD (gastroesophageal reflux disease)    Past Surgical History  Procedure Laterality Date  . Partial hysterectomy  1970    endometriosis  . Foot problems      neuromas  . Anterior repari w//surg proc  06/30/99    RSO  . History of ct      mass right adnexa, U?S by GYN-observation  . Bladder tack    . Bladder suspension    . Cataracts    . Kyphoplasty N/A 09/04/2012    Procedure: T7, T8,T12 Kyphoplasty ;  Surgeon: Clydene Fake, MD;  Location:  [facility];  Service: Neurosurgery;  Laterality: N/A;  thoracic seven,eight, and twelve    History  Substance Use Topics  . Smoking status: Never Smoker   . Smokeless tobacco: Never Used  . Alcohol Use: No   Family History  Problem Relation Age of Onset  . Diabetes Mother   . Heart disease Mother     pacemaker  . Hypertension Mother   . COPD Father   . Diabetes Sister   . Diabetes Brother   . Mental illness Son     anxiety-mental health problems  . Diabetes Brother   . Diabetes Sister   . Diabetes Sister   . Hypertension Other    Allergies  Allergen Reactions  . Tdap [Diphth-Acell Pertussis-Tetanus] Swelling  . Amlodipine Besylate     REACTION: edema  . Codeine     headache  . Lovastatin      REACTION: leg pain  . Penicillins     Unknown reaction  . Sertraline Hcl     REACTION: caused insomnia  . Simvastatin     REACTION: non tolerant- leg pain  . Latex Rash  . Sulfonamide Derivatives Rash   Current Outpatient Prescriptions on File Prior to Visit  Medication Sig Dispense Refill  . calcium-vitamin D (OSCAL WITH D) 500-200 MG-UNIT per tablet Take 1 tablet by mouth daily.      Marland Kitchen docusate sodium (COLACE) 100 MG capsule Take 100 mg by mouth 2 (two) times daily.      . fluticasone (FLONASE) 50 MCG/ACT nasal spray Place 2 sprays into the nose daily. 1-2 sprays each nostril daily.      . hydrochlorothiazide (HYDRODIURIL) 25 MG tablet Take 2 tablets (50 mg total) by mouth every morning.  60 tablet  11  . Omega-3 Fatty Acids (FISH OIL) 1200 MG CAPS Take 2 by mouth daily       . omeprazole (PRILOSEC) 20 MG capsule Take 1 capsule (20 mg total) by mouth 2 (two) times daily.  180 capsule  1  . PARoxetine (PAXIL) 10 MG tablet Take 1 tablet (10 mg total) by mouth every morning.  30 tablet  11  . potassium chloride (K-DUR,KLOR-CON) 10 MEQ tablet Take 1 tablet (10 mEq total) by mouth daily.  30 tablet  11  . zolpidem (AMBIEN) 5 MG tablet Take 1 tablet (5 mg total) by mouth at bedtime.  30 tablet  3  . clotrimazole (LOTRIMIN) 1 % cream Apply topically. Clean under breast with /ns, apply cream BID until rash/fungal resolved      . fexofenadine (ALLEGRA) 180 MG tablet Take 180 mg by mouth daily as needed (allergies).      Marland Kitchen HYDROcodone-acetaminophen (NORCO/VICODIN) 5-325 MG per tablet Take 1 tablet by mouth as needed for pain.      . Multiple Vitamins-Minerals (CENTRUM CARDIO) TABS Take 1 tablet by mouth daily.      . Multiple Vitamins-Minerals (CERTA-VITE PO) Take 1 tablet by mouth daily.      Jeralyn Bennett Shell 500 MG TABS Take 1 tablet by mouth daily.      . traMADol (ULTRAM) 50 MG tablet Take 100 mg by mouth every 8 (eight) hours as needed.      . [DISCONTINUED] potassium chloride (K-DUR) 10 MEQ  tablet Take 1 tablet (10 mEq total) by mouth daily.  30 tablet  3   No current facility-administered medications on file prior to visit.      Review of Systems Review of Systems  Constitutional: Negative for fever,  appetite change, fatigue and unexpected weight change.  Eyes: Negative for pain and visual disturbance.  Respiratory: Negative for cough and shortness of breath.   Cardiovascular: Negative for cp or palpitations    Gastrointestinal: Negative for nausea, diarrhea and constipation.  Genitourinary: Negative for urgency and frequency.  Skin: Negative for pallor or rash   MSK pos for continued TS pain since her fractures  Neurological: Negative for weakness,, numbness and headaches. neg for facial droop or speech problems  Hematological: Negative for adenopathy. Does not bruise/bleed easily.  Psychiatric/Behavioral: Negative for dysphoric mood. The patient is anxious.         Objective:   Physical Exam  Constitutional: She appears well-developed and well-nourished. No distress.  HENT:  Head: Normocephalic and atraumatic.  Right Ear: External ear normal.  Left Ear: External ear normal.  Mouth/Throat: Oropharynx is clear and moist.  Eyes: Conjunctivae and EOM are normal. Pupils are equal, round, and reactive to light. No scleral icterus.  Neck: Normal range of motion. Neck supple. No JVD present. Carotid bruit is not present. No thyromegaly present.  Cardiovascular: Normal rate, regular rhythm, normal heart sounds and intact distal pulses.  Exam reveals no gallop.   Pulmonary/Chest: Effort normal and breath sounds normal. No respiratory distress. She has no wheezes. She exhibits no tenderness.  Abdominal: Soft. Bowel sounds are normal. She exhibits no distension, no abdominal bruit and no mass. There is no tenderness.  Musculoskeletal: Normal range of motion. She exhibits no edema and no tenderness.  Diffuse tenderness over thoracic spine with limited rom Kyphosis noted    Lymphadenopathy:    She has no cervical adenopathy.  Neurological: She is alert. She has normal reflexes. No cranial nerve deficit or sensory deficit. She exhibits normal muscle tone. She displays a negative Romberg sign. Coordination and gait normal.  No cerebellar signs  No nystagmus  Not dizzy today No rhomberg- very steady gait     Skin: Skin is warm and dry. No rash noted. No erythema. No pallor.  Psychiatric: She has a normal mood and affect.  Talkative and mentally sharp          Assessment & Plan:

## 2013-04-12 LAB — COMPREHENSIVE METABOLIC PANEL
ALT: 13 U/L (ref 0–35)
AST: 20 U/L (ref 0–37)
Albumin: 4.1 g/dL (ref 3.5–5.2)
Alkaline Phosphatase: 64 U/L (ref 39–117)
BUN: 16 mg/dL (ref 6–23)
Chloride: 94 mEq/L — ABNORMAL LOW (ref 96–112)
Potassium: 3.6 mEq/L (ref 3.5–5.1)
Sodium: 132 mEq/L — ABNORMAL LOW (ref 135–145)
Total Protein: 7 g/dL (ref 6.0–8.3)

## 2013-04-12 LAB — CBC WITH DIFFERENTIAL/PLATELET
Basophils Absolute: 0 10*3/uL (ref 0.0–0.1)
Eosinophils Absolute: 0.2 10*3/uL (ref 0.0–0.7)
Lymphocytes Relative: 34.9 % (ref 12.0–46.0)
MCHC: 34 g/dL (ref 30.0–36.0)
MCV: 93.3 fl (ref 78.0–100.0)
Monocytes Absolute: 0.6 10*3/uL (ref 0.1–1.0)
Neutrophils Relative %: 53.6 % (ref 43.0–77.0)
Platelets: 327 10*3/uL (ref 150.0–400.0)
RDW: 13.3 % (ref 11.5–14.6)

## 2013-04-15 NOTE — Assessment & Plan Note (Signed)
Since spinal fx after fall - this initially imp and then became much worse inst to use tylenol every 4 hours as needed (she is afraid to take analgesics) She will f/u with Dr Phoebe Perch for this

## 2013-04-15 NOTE — Assessment & Plan Note (Signed)
With nl exam-reassuring (and also imp symptoms today) Reassuring vitals as well  Disc her ongoing loss of balance and fall risk prev in detail  Pt to use walker  Lab today  ? If transient vertigo or other  Update if not starting to improve in a week or if worsening

## 2013-04-19 ENCOUNTER — Telehealth: Payer: Self-pay

## 2013-04-19 DIAGNOSIS — S22069S Unspecified fracture of T7-T8 vertebra, sequela: Secondary | ICD-10-CM

## 2013-04-19 DIAGNOSIS — S22080S Wedge compression fracture of T11-T12 vertebra, sequela: Secondary | ICD-10-CM

## 2013-04-19 NOTE — Telephone Encounter (Signed)
Pt left v/m wanting to ck with [place] if was able to reach Dr Phoebe Perch office; pt has not heard from Dr Phoebe Perch office about appt. Pt wants to know if we can get intouch with Dr Phoebe Perch office or can a referral be made to another doctor. Pt request cb.

## 2013-04-19 NOTE — Telephone Encounter (Signed)
I have called and left 2 voicemail for [place] Dr. Phoebe Perch assistant requesting that she call pt with appt., and pt has called their office several times too, Rout to Dr. Milinda Antis to see if she wants to refer pt to another doc

## 2013-04-19 NOTE — Telephone Encounter (Signed)
Since Dr Doreen Beam office is not responding at all - I am fine with ref to another doctor- at this point I think orthopedics would be ok given her current issues (rather than neurosurg)- is she ok with that?

## 2013-04-20 NOTE — Telephone Encounter (Signed)
Left message with family requesting pt to call office  

## 2013-04-21 NOTE — Progress Notes (Signed)
This encounter was created in error - please disregard.

## 2013-04-23 NOTE — Telephone Encounter (Signed)
Spoke with pt, she doesn't want orthopedic dr performing surgery on her (because of her husbands bad experiences with having ortho's perform surgery on him) she would prefer to see neurosurgeon.

## 2013-04-23 NOTE — Telephone Encounter (Signed)
I will do ref  

## 2013-04-25 ENCOUNTER — Other Ambulatory Visit: Payer: Self-pay | Admitting: Family Medicine

## 2013-04-25 DIAGNOSIS — R7989 Other specified abnormal findings of blood chemistry: Secondary | ICD-10-CM

## 2013-04-25 DIAGNOSIS — E871 Hypo-osmolality and hyponatremia: Secondary | ICD-10-CM

## 2013-04-30 ENCOUNTER — Encounter: Payer: Self-pay | Admitting: Radiology

## 2013-05-01 ENCOUNTER — Other Ambulatory Visit (INDEPENDENT_AMBULATORY_CARE_PROVIDER_SITE_OTHER): Payer: Medicare Other

## 2013-05-01 ENCOUNTER — Encounter: Payer: Self-pay | Admitting: *Deleted

## 2013-05-01 DIAGNOSIS — R7989 Other specified abnormal findings of blood chemistry: Secondary | ICD-10-CM

## 2013-05-01 DIAGNOSIS — E871 Hypo-osmolality and hyponatremia: Secondary | ICD-10-CM

## 2013-05-01 DIAGNOSIS — R6889 Other general symptoms and signs: Secondary | ICD-10-CM

## 2013-05-01 LAB — TSH: TSH: 1.1 u[IU]/mL (ref 0.35–5.50)

## 2013-05-01 LAB — T4, FREE: Free T4: 0.66 ng/dL (ref 0.60–1.60)

## 2013-05-01 NOTE — Telephone Encounter (Signed)
Encounter opened in error

## 2013-05-03 ENCOUNTER — Encounter: Payer: Self-pay | Admitting: *Deleted

## 2013-05-07 ENCOUNTER — Encounter: Payer: Self-pay | Admitting: Family Medicine

## 2013-05-08 ENCOUNTER — Encounter: Payer: Self-pay | Admitting: *Deleted

## 2013-05-21 ENCOUNTER — Encounter: Payer: Self-pay | Admitting: Family Medicine

## 2013-07-03 ENCOUNTER — Other Ambulatory Visit: Payer: Self-pay | Admitting: *Deleted

## 2013-07-03 MED ORDER — ZOLPIDEM TARTRATE 5 MG PO TABS: 5.0000 mg | ORAL_TABLET | Freq: Every day | ORAL | Status: DC

## 2013-07-03 NOTE — Telephone Encounter (Signed)
Px written for call in   Ask her to be extremely cautious of sedation/ falls and dizziness with this drug

## 2013-07-03 NOTE — Telephone Encounter (Signed)
Mrs. Amber Rollins notified as instructed by telephone.  Amb[facility] called to [facility].

## 2013-07-03 NOTE — Telephone Encounter (Signed)
Patient called requesting a refill on her Ambien. Patient states that she has been dividing the pill to make them last. Patient states that she is having back problems and her husband has cancer and this really helps her to get some sleep at night. Pharmacy [facility].  Last refill 01/10/13 #30 with 3 refills.

## 2013-07-18 ENCOUNTER — Telehealth: Payer: Self-pay | Admitting: *Deleted

## 2013-07-18 NOTE — Telephone Encounter (Signed)
Received prior auth request omeprazole. Auth paperwork obtained and placed in your inbox.

## 2013-07-27 NOTE — Telephone Encounter (Signed)
Omeprazole [place] by [employer] from 07/19/13-07/19/14.

## 2013-09-10 ENCOUNTER — Encounter: Payer: Self-pay | Admitting: Family Medicine

## 2013-09-10 ENCOUNTER — Ambulatory Visit (INDEPENDENT_AMBULATORY_CARE_PROVIDER_SITE_OTHER): Payer: Medicare Other | Admitting: Family Medicine

## 2013-09-10 VITALS — BP 110/70 | HR 69 | Temp 98.1°F | Ht 63.0 in | Wt 129.0 lb

## 2013-09-10 DIAGNOSIS — R350 Frequency of micturition: Secondary | ICD-10-CM

## 2013-09-10 DIAGNOSIS — N390 Urinary tract infection, site not specified: Secondary | ICD-10-CM

## 2013-09-10 LAB — POCT URINALYSIS DIPSTICK
Bilirubin, UA: NEGATIVE
Glucose, UA: NEGATIVE
NITRITE UA: NEGATIVE
PH UA: 6
Protein, UA: NEGATIVE
Spec Grav, UA: 1.015
UROBILINOGEN UA: 0.2

## 2013-09-10 MED ORDER — CIPROFLOXACIN HCL 250 MG PO TABS
250.0000 mg | ORAL_TABLET | Freq: Two times a day (BID) | ORAL | Status: DC
Start: 2013-09-10 — End: 2013-10-08

## 2013-09-10 MED ORDER — FLUCONAZOLE 150 MG PO TABS: 150.0000 mg | ORAL_TABLET | Freq: Once | ORAL | Status: DC

## 2013-09-10 NOTE — Assessment & Plan Note (Signed)
Urine cx sent  tx with cipro (pt is pcn and sulfa all) Fluids  Given diflucan 150 to take after abx for yeast if needed Update if not starting to improve in a week or if worsening

## 2013-09-10 NOTE — Progress Notes (Signed)
Subjective:    Patient ID: Amber Rollins, female    DOB: 1930/09/10, 80 y.o.   MRN: [id number]  HPI Here for uti symptoms - for about 1-2 weeks   She has burning on urination and also itching  Also very frequent urination  No odor in urine  No blood in urine that she can see  Was very nauseated on sat  No fever  Some generalized bloating in abdomen   No vaginal discharge  Pt always gets a yeast infx after abx   Patient Active Problem List   Diagnosis Date Noted  . Dizziness 04/11/2013  . Thoracic back pain 04/11/2013  . Urinary incontinence 12/27/2012  . Depression 09/24/2012  . Thoracic kyphosis 09/24/2012  . UTI (urinary tract infection) 08/12/2012  . Acute blood loss anemia 08/10/2012  . Fall 08/09/2012  . Sternal fracture 08/09/2012  . Left rib fracture 08/09/2012  . T8 vertebral fracture 08/09/2012  . T12 compression fracture 08/09/2012  . Laceration of lower leg 08/09/2012  . Hemorrhoid 12/24/2011  . Dysuria 12/24/2011  . Low back pain 12/24/2011  . Sinusitis, bacterial 07/26/2011  . Gastroenteritis 10/06/2010  . Hypokalemia 10/06/2010  . HEMATURIA UNSPECIFIED 10/02/2009  . NIGHT SWEATS 10/02/2009  . GERD 06/18/2009  . INCONTINENCE, URGE 06/18/2009  . FATIGUE 03/12/2009  . HYPOKALEMIA 10/08/2008  . MENOPAUSAL SYNDROME 01/22/2008  . EDEMA 07/19/2007  . ANXIETY STATE, UNSPECIFIED 06/21/2007  . REACTION, ACUTE STRESS W/EMOTIONAL DSTURB 03/06/2007  . OVARIAN MASS 03/06/2007  . FOOT PAIN, CHRONIC 03/06/2007  . ALLERGIC RHINITIS, SEASONAL 09/12/2006  . IRRITABLE BOWEL SYNDROME 09/12/2006  . FIBROCYSTIC BREAST DISEASE 09/12/2006  . OSTEOARTHRITIS 09/12/2006  . HEMORRHOIDS, HX OF 09/12/2006  . HYSTERECTOMY, HX OF 09/12/2006  . HYPERTENSION 05/31/2000  . OSTEOPENIA 11/29/1999  . HYPERLIPIDEMIA 05/31/1992   Past Medical History  Diagnosis Date  . Hyperlipemia 05/31/1992  . Hypertension 05/31/2000  . Osteopenia 11/29/1999  . Osteoarthritis   .  Pedal edema     worsened by Norvasc  . Chronic foot pain     mortons neuroma  . Plantar fasciitis   . Menopausal symptoms     vasomotor symptoms-severe  . Cystocele 01/01    neg. sx  . Hemorrhoids   . fracture fibula ? 2003    left  . Complication of anesthesia   . PONV (postoperative nausea and vomiting)   . GERD (gastroesophageal reflux disease)    Past Surgical History  Procedure Laterality Date  . Partial hysterectomy  1970    endometriosis  . Foot problems      neuromas  . Anterior repari w//surg proc  06/30/99    RSO  . History of ct      mass right adnexa, U?S by GYN-observation  . Bladder tack    . Bladder suspension    . Cataracts    . Kyphoplasty N/A 09/04/2012    Procedure: T7, T8,T12 Kyphoplasty ;  Surgeon: Clydene FakeJames R Hirsch, MD;  Location: [facility];  Service: Neurosurgery;  Laterality: N/A;  thoracic seven,eight, and twelve    History  Substance Use Topics  . Smoking status: Never Smoker   . Smokeless tobacco: Never Used  . Alcohol Use: No   Family History  Problem Relation Age of Onset  . Diabetes Mother   . Heart disease Mother     pacemaker  . Hypertension Mother   . COPD Father   . Diabetes Sister   . Diabetes Brother   . Mental illness Son  anxiety-mental health problems  . Diabetes Brother   . Diabetes Sister   . Diabetes Sister   . Hypertension Other    Allergies  Allergen Reactions  . Tdap [Diphth-Acell Pertussis-Tetanus] Swelling  . Amlodipine Besylate     REACTION: edema  . Codeine     headache  . Lovastatin     REACTION: leg pain  . Penicillins     Unknown reaction  . Sertraline Hcl     REACTION: caused insomnia  . Simvastatin     REACTION: non tolerant- leg pain  . Latex Rash  . Sulfonamide Derivatives Rash   Current Outpatient Prescriptions on File Prior to Visit  Medication Sig Dispense Refill  . acetaminophen (TYLENOL) 500 MG tablet Take 500 mg by mouth every 6 (six) hours as needed.      Marland Kitchen. azelastine (ASTELIN)  137 MCG/SPRAY nasal spray Place 2 sprays into both nostrils 2 (two) times daily. Use in each nostril as directed      . calcium-vitamin D (OSCAL WITH D) 500-200 MG-UNIT per tablet Take 1 tablet by mouth daily.      . fexofenadine (ALLEGRA) 180 MG tablet Take 180 mg by mouth daily as needed (allergies).      . fluticasone (FLONASE) 50 MCG/ACT nasal spray Place 2 sprays into the nose daily. 1-2 sprays each nostril daily.      . hydrochlorothiazide (HYDRODIURIL) 25 MG tablet Take 2 tablets (50 mg total) by mouth every morning.  60 tablet  11  . Omega-3 Fatty Acids (FISH OIL) 1200 MG CAPS Take 2 by mouth daily       . omeprazole (PRILOSEC) 20 MG capsule Take 1 capsule (20 mg total) by mouth 2 (two) times daily.  180 capsule  1  . potassium chloride (K-DUR,KLOR-CON) 10 MEQ tablet Take 1 tablet (10 mEq total) by mouth daily.  30 tablet  11  . [DISCONTINUED] potassium chloride (K-DUR) 10 MEQ tablet Take 1 tablet (10 mEq total) by mouth daily.  30 tablet  3   No current facility-administered medications on file prior to visit.    Review of Systems Review of Systems  Constitutional: Negative for fever, appetite change, fatigue and unexpected weight change.  Eyes: Negative for pain and visual disturbance.  Respiratory: Negative for cough and shortness of breath.   Cardiovascular: Negative for cp or palpitations    Gastrointestinal: Negative for nausea, diarrhea and constipation.  Genitourinary: pos  for urgency and frequency. pos for vaginal itching  Skin: Negative for pallor or rash   Neurological: Negative for weakness, light-headedness, numbness and headaches.  Hematological: Negative for adenopathy. Does not bruise/bleed easily.  Psychiatric/Behavioral: Negative for dysphoric mood. The patient is not nervous/anxious.         Objective:   Physical Exam  Constitutional: She appears well-developed and well-nourished. No distress.  HENT:  Head: Normocephalic and atraumatic.  Mouth/Throat:  Oropharynx is clear and moist.  Eyes: Conjunctivae and EOM are normal. Pupils are equal, round, and reactive to light.  Neck: Normal range of motion. Neck supple.  Cardiovascular: Normal rate and regular rhythm.   Pulmonary/Chest: Effort normal and breath sounds normal.  Abdominal: Soft. Bowel sounds are normal. She exhibits no distension and no mass. There is no tenderness. There is no rebound and no guarding.  No cva tenderness   Lymphadenopathy:    She has no cervical adenopathy.  Neurological: She is alert.  Skin: Skin is warm and dry. No rash noted.  Psychiatric: She has a normal mood  and affect.          Assessment & Plan:

## 2013-09-10 NOTE — Patient Instructions (Signed)
Take the cipro for urinary tract infection (uti) When done with that - take the one diflucan pill for yeast  Update if not starting to improve in a week or if worsening   We will call you with urine culture result when that returns

## 2013-09-10 NOTE — Progress Notes (Signed)
Pre visit review using our clinic review tool, if applicable. No additional management support is needed unless otherwise documented below in the visit note. 

## 2013-09-11 LAB — URINE CULTURE: Colony Count: 8000

## 2013-10-04 ENCOUNTER — Telehealth: Payer: Self-pay

## 2013-10-04 NOTE — Telephone Encounter (Signed)
Her urine cx did not show significant growth- follow up when able - we need to re check urine and do a vaginal swab as well to see what is going on

## 2013-10-04 NOTE — Telephone Encounter (Signed)
Pt left v/m; pt was seen in 08/2013 for UTI and pt thought she was over UTI but now pt experiencing burning upon urination with frequency and lower back pain; pt has not taken temp ; pt still having vaginal itching; itching is better but not cleared. [facility].pt request cb.

## 2013-10-04 NOTE — Telephone Encounter (Signed)
Pt couldn't come in for an appt tomorrow, so scheduled an appt for Monday with Dr. Milinda Antisower, I advise pt if sxs worsen over the weekend she needs to go to an UC, pt verbalized understanding

## 2013-10-04 NOTE — Telephone Encounter (Signed)
Left message with family requesting pt to call office  

## 2013-10-08 ENCOUNTER — Ambulatory Visit: Payer: Medicare Other | Admitting: Family Medicine

## 2013-10-08 ENCOUNTER — Encounter: Payer: Self-pay | Admitting: Family Medicine

## 2013-10-08 ENCOUNTER — Ambulatory Visit (INDEPENDENT_AMBULATORY_CARE_PROVIDER_SITE_OTHER): Payer: Medicare Other | Admitting: Family Medicine

## 2013-10-08 VITALS — BP 140/68 | HR 80 | Temp 98.0°F | Ht 62.0 in | Wt 128.5 lb

## 2013-10-08 DIAGNOSIS — A499 Bacterial infection, unspecified: Secondary | ICD-10-CM

## 2013-10-08 DIAGNOSIS — N760 Acute vaginitis: Secondary | ICD-10-CM

## 2013-10-08 DIAGNOSIS — B9689 Other specified bacterial agents as the cause of diseases classified elsewhere: Secondary | ICD-10-CM

## 2013-10-08 DIAGNOSIS — R300 Dysuria: Secondary | ICD-10-CM

## 2013-10-08 LAB — POCT URINALYSIS DIPSTICK
BILIRUBIN UA: NEGATIVE
Glucose, UA: NEGATIVE
KETONES UA: NEGATIVE
Nitrite, UA: NEGATIVE
Protein, UA: NEGATIVE
SPEC GRAV UA: 1.015
Urobilinogen, UA: 0.2
pH, UA: 6

## 2013-10-08 LAB — POCT UA - MICROSCOPIC ONLY
Bacteria, U Microscopic: 0
CRYSTALS, UR, HPF, POC: 0
Casts, Ur, LPF, POC: 0
WBC, Ur, HPF, POC: 0
YEAST UA: 0

## 2013-10-08 LAB — POCT WET PREP (WET MOUNT): KOH Wet Prep POC: NEGATIVE

## 2013-10-08 MED ORDER — METRONIDAZOLE 500 MG PO TABS: 500.0000 mg | ORAL_TABLET | Freq: Two times a day (BID) | ORAL | Status: DC

## 2013-10-08 NOTE — Patient Instructions (Signed)
Try the flagyl twice daily for bacterial vaginosis  If symptoms are not better in a week - please let me know  We may consider estrogen cream or re check from a urologist   Keep drinking water

## 2013-10-08 NOTE — Assessment & Plan Note (Signed)
?   If this is the source of her pelvic and vaginal discomfort and dysuria ua neg except for blood (baseline and rev last urology note about this today) cx was recently neg  Clue cells on wet prep tx for 1 wk with oral flagyl If no imp consider use of estrogen cream for atrophic vaginitis (also adding to incontinence no doubt) and urol visit

## 2013-10-08 NOTE — Assessment & Plan Note (Signed)
ua neg  Suspect from vaginitis

## 2013-10-08 NOTE — Progress Notes (Signed)
Pre visit review using our clinic review tool, if applicable. No additional management support is needed unless otherwise documented below in the visit note. 

## 2013-10-08 NOTE — Progress Notes (Signed)
Subjective:    Patient ID: Amber Rollins, female    DOB: January 26, 1931, 80 y.o.   MRN: [id number]  HPI Last visit for uti - and tx with cipro and cx was negative She took the diflucan- that helped just a little   Right now has vaginal itching -mostly on the outside  No discharge  No odor   Dysuria - urine hurts "when it comes out" No new frequency but she has more urgency  She takes showers-not baths   She does have incontinence Had a bladder tack years ago - hx of infections then also   After her hospitalization - in [facility] home struggled with utis as well   Reviewed her last urology note with Dr Sanda LingerManny-she had neg CT and also neg cystoscopy for chronic micro hematuria   Patient Active Problem List   Diagnosis Date Noted  . Bacterial vaginosis 10/08/2013  . Dizziness 04/11/2013  . Thoracic back pain 04/11/2013  . Urinary incontinence 12/27/2012  . Depression 09/24/2012  . Thoracic kyphosis 09/24/2012  . UTI (urinary tract infection) 08/12/2012  . Acute blood loss anemia 08/10/2012  . Fall 08/09/2012  . Sternal fracture 08/09/2012  . Left rib fracture 08/09/2012  . T8 vertebral fracture 08/09/2012  . T12 compression fracture 08/09/2012  . Laceration of lower leg 08/09/2012  . Hemorrhoid 12/24/2011  . Dysuria 12/24/2011  . Low back pain 12/24/2011  . Sinusitis, bacterial 07/26/2011  . Gastroenteritis 10/06/2010  . Hypokalemia 10/06/2010  . HEMATURIA UNSPECIFIED 10/02/2009  . NIGHT SWEATS 10/02/2009  . GERD 06/18/2009  . INCONTINENCE, URGE 06/18/2009  . FATIGUE 03/12/2009  . HYPOKALEMIA 10/08/2008  . MENOPAUSAL SYNDROME 01/22/2008  . EDEMA 07/19/2007  . ANXIETY STATE, UNSPECIFIED 06/21/2007  . REACTION, ACUTE STRESS W/EMOTIONAL DSTURB 03/06/2007  . OVARIAN MASS 03/06/2007  . FOOT PAIN, CHRONIC 03/06/2007  . ALLERGIC RHINITIS, SEASONAL 09/12/2006  . IRRITABLE BOWEL SYNDROME 09/12/2006  . FIBROCYSTIC BREAST DISEASE 09/12/2006  . OSTEOARTHRITIS 09/12/2006  .  HEMORRHOIDS, HX OF 09/12/2006  . HYSTERECTOMY, HX OF 09/12/2006  . HYPERTENSION 05/31/2000  . OSTEOPENIA 11/29/1999  . HYPERLIPIDEMIA 05/31/1992   Past Medical History  Diagnosis Date  . Hyperlipemia 05/31/1992  . Hypertension 05/31/2000  . Osteopenia 11/29/1999  . Osteoarthritis   . Pedal edema     worsened by Norvasc  . Chronic foot pain     mortons neuroma  . Plantar fasciitis   . Menopausal symptoms     vasomotor symptoms-severe  . Cystocele 01/01    neg. sx  . Hemorrhoids   . fracture fibula ? 2003    left  . Complication of anesthesia   . PONV (postoperative nausea and vomiting)   . GERD (gastroesophageal reflux disease)    Past Surgical History  Procedure Laterality Date  . Partial hysterectomy  1970    endometriosis  . Foot problems      neuromas  . Anterior repari w//surg proc  06/30/99    RSO  . History of ct      mass right adnexa, U?S by GYN-observation  . Bladder tack    . Bladder suspension    . Cataracts    . Kyphoplasty N/A 09/04/2012    Procedure: T7, T8,T12 Kyphoplasty ;  Surgeon: Clydene FakeJames R Hirsch, MD;  Location: [facility];  Service: Neurosurgery;  Laterality: N/A;  thoracic seven,eight, and twelve    History  Substance Use Topics  . Smoking status: Never Smoker   . Smokeless tobacco: Never Used  . Alcohol  Use: No   Family History  Problem Relation Age of Onset  . Diabetes Mother   . Heart disease Mother     pacemaker  . Hypertension Mother   . COPD Father   . Diabetes Sister   . Diabetes Brother   . Mental illness Son     anxiety-mental health problems  . Diabetes Brother   . Diabetes Sister   . Diabetes Sister   . Hypertension Other    Allergies  Allergen Reactions  . Tdap [Diphth-Acell Pertussis-Tetanus] Swelling  . Amlodipine Besylate     REACTION: edema  . Codeine     headache  . Lovastatin     REACTION: leg pain  . Penicillins     Unknown reaction  . Sertraline Hcl     REACTION: caused insomnia  . Simvastatin      REACTION: non tolerant- leg pain  . Latex Rash  . Sulfonamide Derivatives Rash   Current Outpatient Prescriptions on File Prior to Visit  Medication Sig Dispense Refill  . acetaminophen (TYLENOL) 500 MG tablet Take 500 mg by mouth every 6 (six) hours as needed.      Marland Kitchen. azelastine (ASTELIN) 137 MCG/SPRAY nasal spray Place 2 sprays into both nostrils 2 (two) times daily. Use in each nostril as directed      . calcium-vitamin D (OSCAL WITH D) 500-200 MG-UNIT per tablet Take 1 tablet by mouth daily.      . fexofenadine (ALLEGRA) 180 MG tablet Take 180 mg by mouth daily as needed (allergies).      . fluticasone (FLONASE) 50 MCG/ACT nasal spray Place 2 sprays into the nose daily. 1-2 sprays each nostril daily.      . hydrochlorothiazide (HYDRODIURIL) 25 MG tablet Take 2 tablets (50 mg total) by mouth every morning.  60 tablet  11  . Omega-3 Fatty Acids (FISH OIL) 1200 MG CAPS Take 2 by mouth daily       . omeprazole (PRILOSEC) 20 MG capsule Take 1 capsule (20 mg total) by mouth 2 (two) times daily.  180 capsule  1  . PARoxetine (PAXIL) 10 MG tablet Take 10 mg by mouth every morning. Only taking when she thinks she needs it (doesn't take daily)      . potassium chloride (K-DUR,KLOR-CON) 10 MEQ tablet Take 1 tablet (10 mEq total) by mouth daily.  30 tablet  11  . zolpidem (AMBIEN) 5 MG tablet Take 5 mg by mouth at bedtime. Only breaks off a piece (doesn't take whole tab)      . [DISCONTINUED] potassium chloride (K-DUR) 10 MEQ tablet Take 1 tablet (10 mEq total) by mouth daily.  30 tablet  3   No current facility-administered medications on file prior to visit.      Review of Systems Review of Systems  Constitutional: Negative for fever, appetite change, fatigue and unexpected weight change.  Eyes: Negative for pain and visual disturbance.  Respiratory: Negative for cough and shortness of breath.   Cardiovascular: Negative for cp or palpitations    Gastrointestinal: Negative for nausea, diarrhea  and constipation.  Genitourinary: pos for urgency and frequency. pos for stress and urge incont/ neg for visible hematuria  GU pos for vulvar discomfort/itch and pelvic pain  Skin: Negative for pallor or rash   Neurological: Negative for weakness, light-headedness, numbness and headaches.  Hematological: Negative for adenopathy. Does not bruise/bleed easily.  Psychiatric/Behavioral: Negative for dysphoric mood. The patient is not nervous/anxious.         Objective:  Physical Exam  Constitutional: She appears well-developed and well-nourished. No distress.  HENT:  Head: Normocephalic and atraumatic.  Eyes: Conjunctivae and EOM are normal. Pupils are equal, round, and reactive to light.  Cardiovascular: Normal rate and regular rhythm.   Abdominal: Soft. Bowel sounds are normal. She exhibits no distension and no mass. There is tenderness. There is no rebound and no guarding.  Mild suprapubic tenderness No cva tenderness   Genitourinary:  Small introitus with atrophic vaginal mucosa  Discomfort with swab for wet prep Less discomfort with bimanual exam- but only able to admit one finger with lubricant No vaginal d/c or odor noted  Urethra appeared nl  Some clue cells on wet prep   Musculoskeletal: She exhibits no edema.  Kyphosis noted   Neurological: She is alert.  Skin: Skin is warm and dry. No rash noted. No erythema. No pallor.  Psychiatric: She has a normal mood and affect.          Assessment & Plan:

## 2013-10-11 ENCOUNTER — Telehealth: Payer: Self-pay | Admitting: Family Medicine

## 2013-10-11 NOTE — Telephone Encounter (Signed)
Patient notified and verbalized understanding. 

## 2013-10-11 NOTE — Telephone Encounter (Signed)
Agree with stopping flagyl. May take benadryl 25mg  1/2-1 tab prn swelling. If not improving with stopping med and benadryl, rec come in for evaluation today or tomorrow.

## 2013-10-11 NOTE — Telephone Encounter (Signed)
Patient Information:  Caller Name: Talbert ForestShirley  Phone: [phone]  Patient: Amber Rollins, Amber Rollins  Gender: Female  DOB: 10/30/1930  Age: 80 Years  [facility]  Office Follow Up:  Does the office need to follow up with this patient?: Yes  Instructions For The Office: OVERRIDE.Marland Kitchen.Marland Kitchen.Marland Kitchen.Please HAVE PHYSICIAN REVIEW. Unsure if medication related.  Multiple allergies to medication.   Advised to stop medication until advised by physician.  Call back for chest pain , shortness of breath, itching, or worsening swelling.  Caller expressed Understanding.  PLEASE CONTACT PATIENT.  RN Note:  OVERRIDE.Marland Kitchen.Marland Kitchen.Marland Kitchen.Please HAVE PHYSICIAN REVIEW. Unsure if medication related.  Multiple allergies to medication.   Advised to stop medication until advised by physician.  Call back for chest pain , shortness of breath, itching, or worsening swelling.  Caller expressed Understanding.  PLEASE CONTACT PATIENT.  Symptoms  Reason For Call & Symptoms: Patient was seen in office on 09/10/13 for UTI given Cipro and Diflucan.  She returned on 10/08/13 for ongoing discomfort and vaginal discomfort. she was diagnosed with Bacterial Vaginosis.  She was placed on Flagyl. She has had x5 doses.  This morning , she awoke with bilateral arm and hand pain . "They feel tight".  Righ hand looks slight swollen. Pain with bending fingers.  No rash, no itching, no n/v/d. No shortness of breath or chest pain  Reviewed Health History In EMR: Yes  Reviewed Medications In EMR: Yes  Reviewed Allergies In EMR: Yes  Reviewed Surgeries / Procedures: Yes  Date of Onset of Symptoms: 10/11/2013  Guideline(s) Used:  Arm Pain  Disposition Per Guideline:   Home Care  Reason For Disposition Reached:   Arm pain  Advice Given:  Call Back If:  You become worse.  RN Overrode Recommendation:  Document Patient  OVERRIDE.Marland Kitchen.Marland Kitchen.Marland Kitchen.Please HAVE PHYSICIAN REVIEW. Unsure if medication related.  Multiple allergies to medication.   Advised to stop  medication until advised by physician.  Call back for chest pain , shortness of breath, itching, or worsening swelling.  Caller expressed Understanding.  PLEASE CONTACT PATIENT.

## 2013-10-17 IMAGING — CR DG THORACIC SPINE 2V
2 series · 2 of 2 positions shown · non-contrast
Comparison: Intraoperative thoracic spine images obtained
yesterday.  CT thoracic spine 08/30/2012.

CLINICAL DATA: Vertebral augmentation at T7, T8, and T12 yesterday.

THORACIC SPINE - 2 VIEW

[t t-spine a.p.]
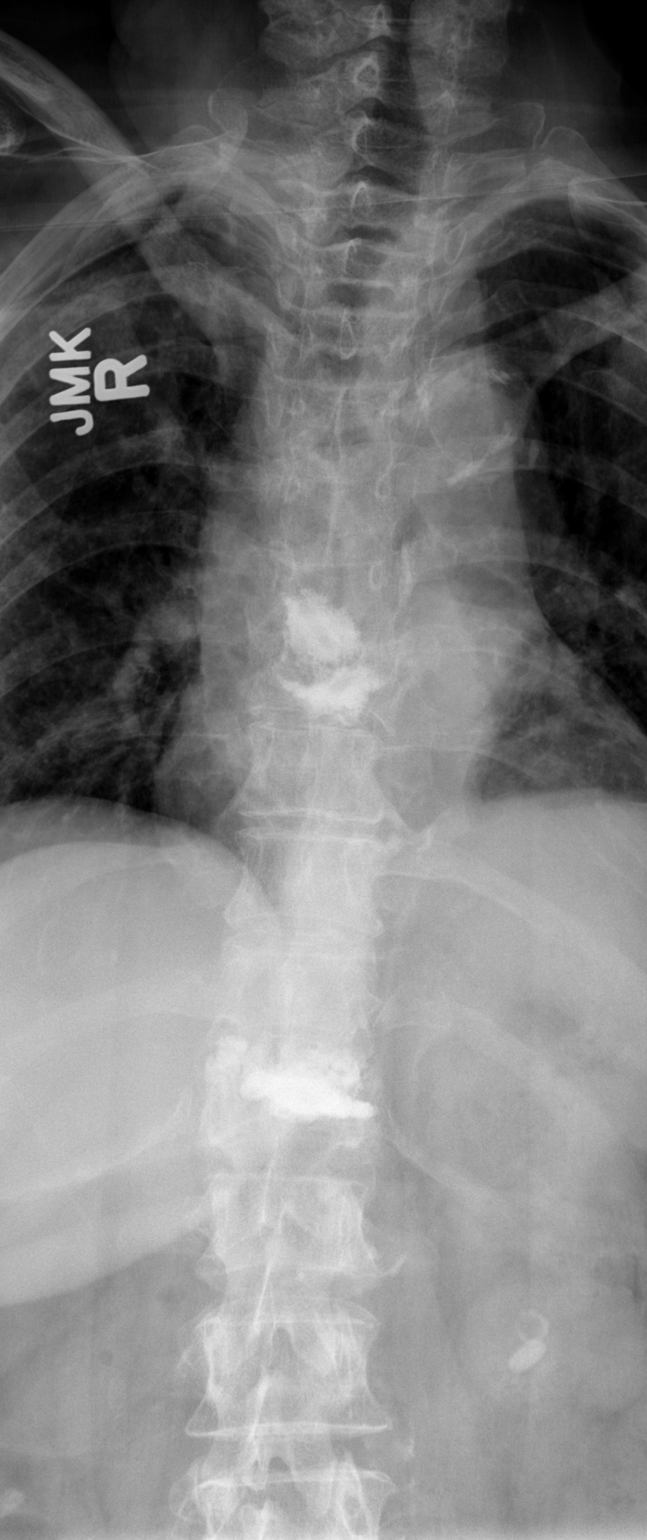

[t t-spine lat]
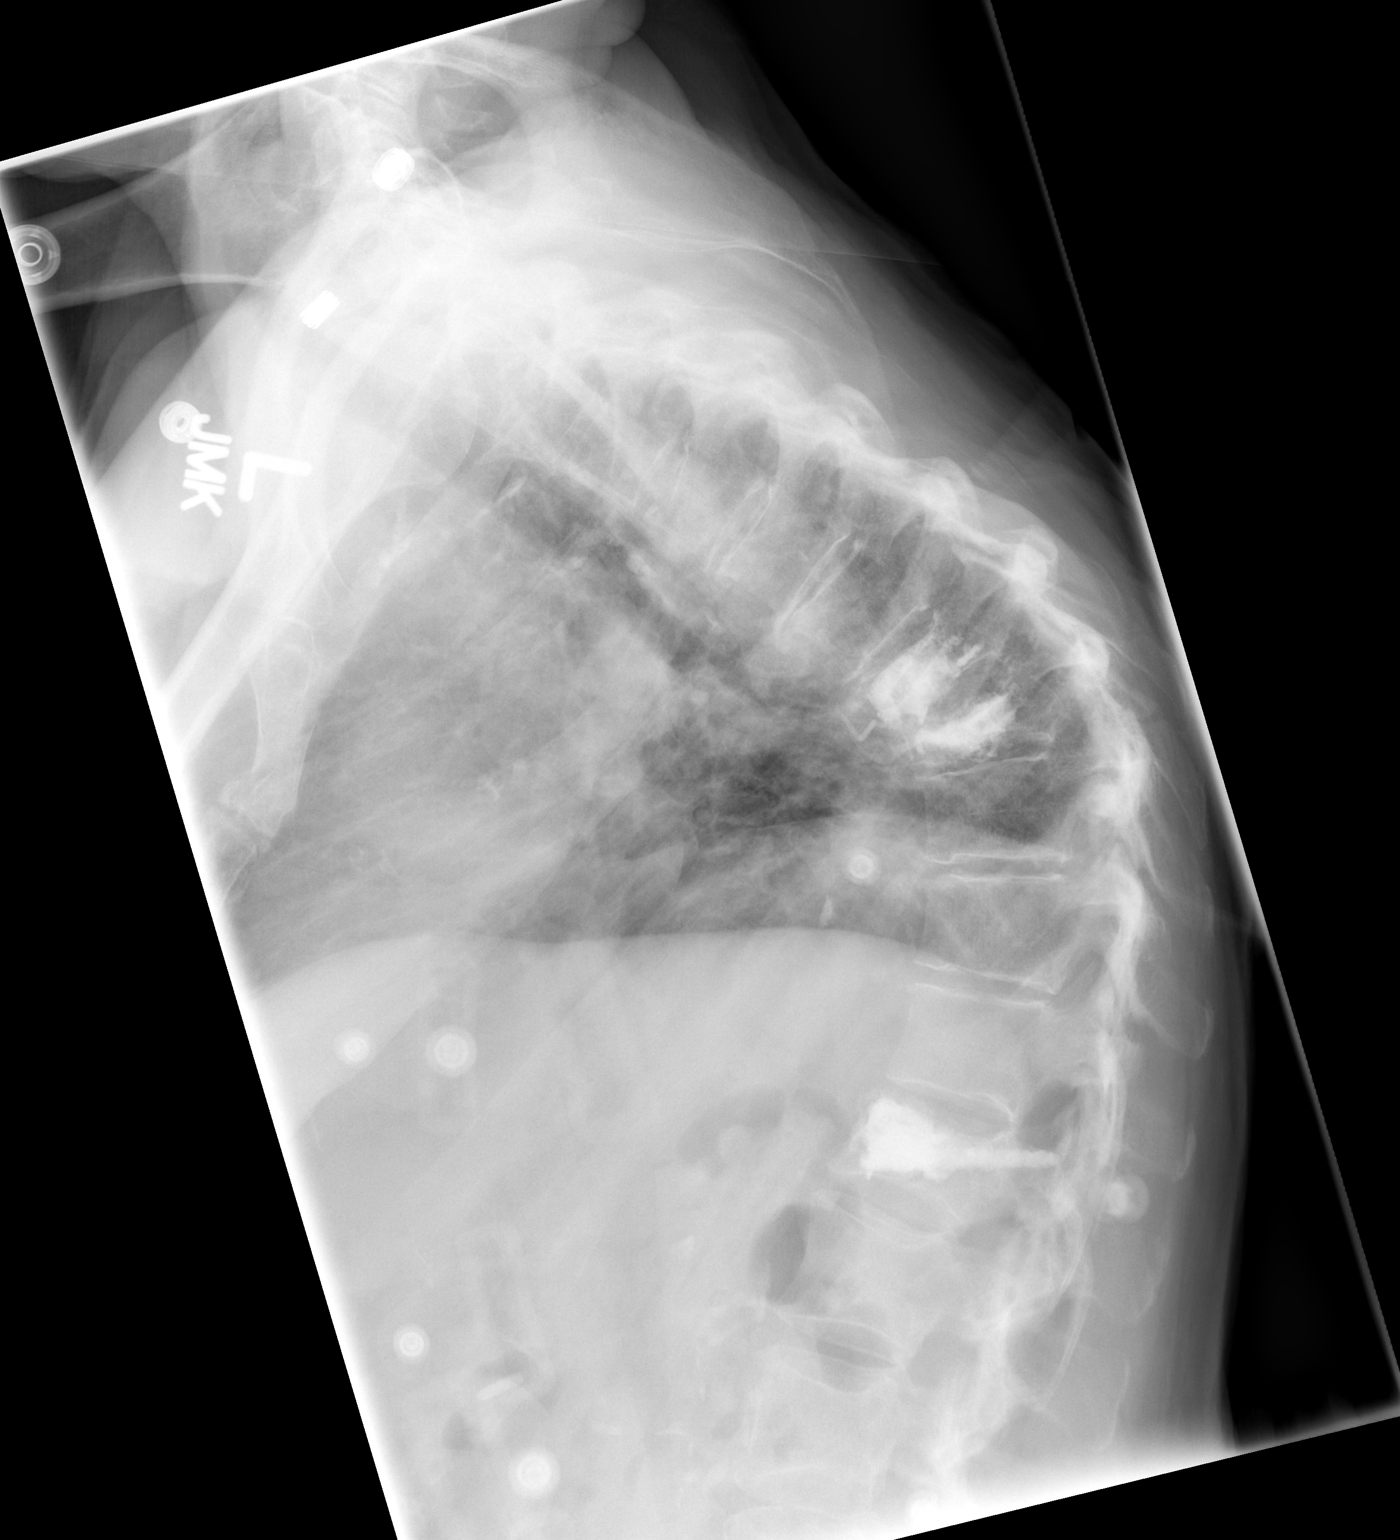

[2 of 2 positions shown; findings below may reference images not displayed]

FINDINGS: Vertebral augmentation of T7, T8, and T12.  Cement within
the left pedicle at T12.  No other vertebral fractures.  Severe
kyphous deformity centered at T8.
IMPRESSION: Vertebral augmentation at T7, T8, and T12.  Severe kyphous
deformity related to the T7 and T8 fractures.

## 2013-11-06 ENCOUNTER — Other Ambulatory Visit: Payer: Self-pay | Admitting: Family Medicine

## 2013-11-14 ENCOUNTER — Ambulatory Visit (INDEPENDENT_AMBULATORY_CARE_PROVIDER_SITE_OTHER): Payer: Medicare Other | Admitting: Family Medicine

## 2013-11-14 ENCOUNTER — Encounter: Payer: Self-pay | Admitting: Family Medicine

## 2013-11-14 ENCOUNTER — Encounter (INDEPENDENT_AMBULATORY_CARE_PROVIDER_SITE_OTHER): Payer: Self-pay

## 2013-11-14 VITALS — BP 124/64 | HR 76 | Temp 98.4°F | Wt 130.8 lb

## 2013-11-14 DIAGNOSIS — J069 Acute upper respiratory infection, unspecified: Secondary | ICD-10-CM

## 2013-11-14 MED ORDER — AZITHROMYCIN 250 MG PO TABS: ORAL_TABLET | ORAL | Status: DC

## 2013-11-14 NOTE — Progress Notes (Signed)
Pre visit review using our clinic review tool, if applicable. No additional management support is needed unless otherwise documented below in the visit note.  Stated with a ST, some cough.  Sx started about ~3 days ago.  GI upset- some abd pain last night.  No vomiting, no diarrhea. No fevers known.  No chills.  Had a few sweats.  No ear pain.  ST continues.  Cough is mild, no sputum, worse at night, some better with cough drops and delsym. No rash.  Voice is altered.    Husband sick at home, he has cancer, was recently hospitalized.   GEN: nad, alert and oriented HEENT: mucous membranes moist, tm w/o erythema, nasal exam w/o erythema, clear discharge noted,  OP with cobblestoning, max sinuses slightly ttp B NECK: supple w/o LA CV: rrr.   PULM: ctab, no inc wob EXT: no edema SKIN: no acute rash

## 2013-11-14 NOTE — Assessment & Plan Note (Signed)
Nontoxic, if not better in a few days then would treat given the duration at that point.  Continue baseline nasal meds at this point.  F/u prn.  D/w pt.  She agrees.

## 2013-11-14 NOTE — Patient Instructions (Signed)
Use your regular nasal sprays in the meantime and try to get some rest.  If not better in a few days, then start the zithromax.   Glad to see you.

## 2013-11-26 ENCOUNTER — Telehealth: Payer: Self-pay

## 2013-11-26 NOTE — Telephone Encounter (Signed)
[name] pts daughter left v/m; pts husband has just been placed in hospice care and thinks her mom may need med to get thru this process. Left v/m for phyllis to cb to get more details.

## 2013-11-26 NOTE — Telephone Encounter (Signed)
I usually prefer the pt to come in so we can discuss anxiety and other symptoms and pick the safest med possible  Please schedule appt if they can

## 2013-11-26 NOTE — Telephone Encounter (Signed)
Amber Rollins said pt is currently not taking med for nervousness; Amber Rollins thinks pt is needing med for nervousness [facility] to [facility].pt does not know Amber Rollins has called; pallative care doctor, Dr Ladona Ridgelaylor suggested to PellaPhyllis to have med for nervousness in case pt needs it.Phyllis request cb. Amber Rollins said cb on 11/27/13 would be OK.

## 2013-11-27 NOTE — Telephone Encounter (Signed)
Patient's daughter notified as instructed by telephone. Offered to schedule an appointment which was declined. Patient's daughter stated that her mom was not aware of the phone call that she had called on her own thinking that she made need some medication with what is going on. [name] stated that she will call back for an appointment if she sees that medication is needed.

## 2014-01-16 ENCOUNTER — Other Ambulatory Visit: Payer: Self-pay | Admitting: Family Medicine

## 2014-01-22 ENCOUNTER — Other Ambulatory Visit: Payer: Self-pay | Admitting: *Deleted

## 2014-01-22 MED ORDER — HYDROCHLOROTHIAZIDE 25 MG PO TABS: 50.0000 mg | ORAL_TABLET | Freq: Every morning | ORAL | Status: DC

## 2014-01-22 NOTE — Telephone Encounter (Signed)
Received faxed refill request from pharmacy. Refill sent to pharmacy electronically. 

## 2014-02-11 ENCOUNTER — Ambulatory Visit (INDEPENDENT_AMBULATORY_CARE_PROVIDER_SITE_OTHER): Payer: Medicare Other | Admitting: Family Medicine

## 2014-02-11 ENCOUNTER — Encounter: Payer: Self-pay | Admitting: Family Medicine

## 2014-02-11 VITALS — BP 133/70 | HR 69 | Temp 97.9°F | Ht 62.0 in | Wt 131.0 lb

## 2014-02-11 DIAGNOSIS — Z23 Encounter for immunization: Secondary | ICD-10-CM

## 2014-02-11 DIAGNOSIS — I10 Essential (primary) hypertension: Secondary | ICD-10-CM

## 2014-02-11 DIAGNOSIS — E876 Hypokalemia: Secondary | ICD-10-CM

## 2014-02-11 DIAGNOSIS — M899 Disorder of bone, unspecified: Secondary | ICD-10-CM

## 2014-02-11 DIAGNOSIS — J301 Allergic rhinitis due to pollen: Secondary | ICD-10-CM

## 2014-02-11 DIAGNOSIS — D62 Acute posthemorrhagic anemia: Secondary | ICD-10-CM

## 2014-02-11 DIAGNOSIS — M949 Disorder of cartilage, unspecified: Secondary | ICD-10-CM

## 2014-02-11 DIAGNOSIS — R5383 Other fatigue: Secondary | ICD-10-CM

## 2014-02-11 DIAGNOSIS — R300 Dysuria: Secondary | ICD-10-CM

## 2014-02-11 DIAGNOSIS — R5381 Other malaise: Secondary | ICD-10-CM

## 2014-02-11 DIAGNOSIS — M545 Low back pain, unspecified: Secondary | ICD-10-CM

## 2014-02-11 DIAGNOSIS — R609 Edema, unspecified: Secondary | ICD-10-CM

## 2014-02-11 DIAGNOSIS — E785 Hyperlipidemia, unspecified: Secondary | ICD-10-CM

## 2014-02-11 DIAGNOSIS — R82998 Other abnormal findings in urine: Secondary | ICD-10-CM

## 2014-02-11 DIAGNOSIS — R8290 Unspecified abnormal findings in urine: Secondary | ICD-10-CM

## 2014-02-11 DIAGNOSIS — B372 Candidiasis of skin and nail: Secondary | ICD-10-CM

## 2014-02-11 LAB — POCT URINALYSIS DIPSTICK
Bilirubin, UA: NEGATIVE
Glucose, UA: NEGATIVE
KETONES UA: NEGATIVE
Nitrite, UA: NEGATIVE
PH UA: 6
Spec Grav, UA: 1.02
UROBILINOGEN UA: 0.2

## 2014-02-11 MED ORDER — NYSTATIN 100000 UNIT/GM EX CREA: 1.0000 "application " | TOPICAL_CREAM | Freq: Two times a day (BID) | CUTANEOUS | Status: DC

## 2014-02-11 MED ORDER — TRAMADOL HCL 50 MG PO TABS: 50.0000 mg | ORAL_TABLET | Freq: Three times a day (TID) | ORAL | Status: DC | PRN

## 2014-02-11 MED ORDER — AZELASTINE HCL 0.1 % NA SOLN: 2.0000 | Freq: Two times a day (BID) | NASAL | Status: DC

## 2014-02-11 MED ORDER — FLUTICASONE PROPIONATE 50 MCG/ACT NA SUSP: 2.0000 | Freq: Every day | NASAL | Status: DC

## 2014-02-11 NOTE — Assessment & Plan Note (Signed)
Pt is no longer getting immunotherapy - wants to get her astelin and flonase refilled  That was done She is fairly controlled with these and antihistamine

## 2014-02-11 NOTE — Assessment & Plan Note (Signed)
bp in fair control at this time  BP Readings from Last 1 Encounters:  02/11/14 133/70   No changes needed Disc lifstyle change with low sodium diet and exercise   Lab today

## 2014-02-11 NOTE — Patient Instructions (Addendum)
Please give urine sample on the way out  Use tramadol for pain a needed - watch out for sedation and falls  If you decide to re start paxil in the future let me know Use the nystatin cream under breasts - if the rash does not improve in 1-2 weeks -alert me  Keep the rash area very dry as well   Labs today  Flu vaccine and pneumonia vaccine today

## 2014-02-11 NOTE — Assessment & Plan Note (Addendum)
Under breasts  Disc imp of keeping rash dry Nystatin cream px  Update if not starting to improve in a week or if worsening

## 2014-02-11 NOTE — Assessment & Plan Note (Signed)
Lipid panel today  Diet controlled Diet is fair -disc low sat fat

## 2014-02-11 NOTE — Progress Notes (Signed)
Subjective:    Patient ID: Amber Rollins, female    DOB: January 23, 1931, 80 y.o.   MRN: [id number]  HPI Here for f/u of chronic health problems and also for a rash   Has a rash with little blisters - sore-and she cannot wear a bra Very itchy under both breasts   Lost her husband in July  She is getting by - lives alone /very lonely  She has a lot of support  She Makes herself get out often  Has a Firefighter as well  She had to take care of him   She stopped her Remus Loffler and also her paxil- did not want to continue  May consider going back on paxil   Allergies are acting up  No longer taking allergy season  No longer going to allergist - needs refill of flonase and astelin   Back still bothers her after her fall  Has tramadol if needed (tries not to take more than one per day)  Usually takes tylenol    bp is up today  No cp or palpitations or headaches or edema  No side effects to medicines  BP Readings from Last 3 Encounters:  02/11/14 156/88  11/14/13 124/64  10/08/13 140/68    On hctz  On klor con for hypokalemia -due for a check     Hyperlipidemia Diet controlled Lab Results  Component Value Date   CHOL 259* 04/17/2008   HDL 49.7 04/17/2008   LDLDIRECT 168.3 04/17/2008   TRIG 132 04/17/2008   CHOLHDL 5.2 CALC 04/17/2008    imm status -wants flu shot today  Wants to check urine / get another ua due to low back pain -she never knows if this is due to bladder    Patient Active Problem List   Diagnosis Date Noted  . Candidal intertrigo 02/11/2014  . Bacterial vaginosis 10/08/2013  . Dizziness 04/11/2013  . Thoracic back pain 04/11/2013  . Urinary incontinence 12/27/2012  . Depression 09/24/2012  . Thoracic kyphosis 09/24/2012  . UTI (urinary tract infection) 08/12/2012  . Acute blood loss anemia 08/10/2012  . Fall 08/09/2012  . Sternal fracture 08/09/2012  . Left rib fracture 08/09/2012  . T8 vertebral fracture 08/09/2012  . T12  compression fracture 08/09/2012  . Laceration of lower leg 08/09/2012  . Hemorrhoid 12/24/2011  . Dysuria 12/24/2011  . Low back pain 12/24/2011  . Hypokalemia 10/06/2010  . HEMATURIA UNSPECIFIED 10/02/2009  . NIGHT SWEATS 10/02/2009  . GERD 06/18/2009  . INCONTINENCE, URGE 06/18/2009  . FATIGUE 03/12/2009  . HYPOKALEMIA 10/08/2008  . MENOPAUSAL SYNDROME 01/22/2008  . EDEMA 07/19/2007  . ANXIETY STATE, UNSPECIFIED 06/21/2007  . REACTION, ACUTE STRESS W/EMOTIONAL DSTURB 03/06/2007  . OVARIAN MASS 03/06/2007  . FOOT PAIN, CHRONIC 03/06/2007  . ALLERGIC RHINITIS, SEASONAL 09/12/2006  . IRRITABLE BOWEL SYNDROME 09/12/2006  . FIBROCYSTIC BREAST DISEASE 09/12/2006  . OSTEOARTHRITIS 09/12/2006  . HEMORRHOIDS, HX OF 09/12/2006  . HYSTERECTOMY, HX OF 09/12/2006  . HYPERTENSION 05/31/2000  . OSTEOPENIA 11/29/1999  . HYPERLIPIDEMIA 05/31/1992   Past Medical History  Diagnosis Date  . Hyperlipemia 05/31/1992  . Hypertension 05/31/2000  . Osteopenia 11/29/1999  . Osteoarthritis   . Pedal edema     worsened by Norvasc  . Chronic foot pain     mortons neuroma  . Plantar fasciitis   . Menopausal symptoms     vasomotor symptoms-severe  . Cystocele 01/01    neg. sx  . Hemorrhoids   . fracture fibula ? 2003  left  . Complication of anesthesia   . PONV (postoperative nausea and vomiting)   . GERD (gastroesophageal reflux disease)    Past Surgical History  Procedure Laterality Date  . Partial hysterectomy  1970    endometriosis  . Foot problems      neuromas  . Anterior repari w//surg proc  06/30/99    RSO  . History of ct      mass right adnexa, U?S by GYN-observation  . Bladder tack    . Bladder suspension    . Cataracts    . Kyphoplasty N/A 09/04/2012    Procedure: T7, T8,T12 Kyphoplasty ;  Surgeon: Clydene Fake, MD;  Location: [facility];  Service: Neurosurgery;  Laterality: N/A;  thoracic seven,eight, and twelve    History  Substance Use Topics  . Smoking  status: Never Smoker   . Smokeless tobacco: Never Used  . Alcohol Use: No   Family History  Problem Relation Age of Onset  . Diabetes Mother   . Heart disease Mother     pacemaker  . Hypertension Mother   . COPD Father   . Diabetes Sister   . Diabetes Brother   . Mental illness Son     anxiety-mental health problems  . Diabetes Brother   . Diabetes Sister   . Diabetes Sister   . Hypertension Other    Allergies  Allergen Reactions  . Tdap [Diphth-Acell Pertussis-Tetanus] Swelling  . Amlodipine Besylate     REACTION: edema  . Codeine     headache  . Lovastatin     REACTION: leg pain  . Metronidazole Swelling    And body aches  . Penicillins     Unknown reaction  . Sertraline Hcl     REACTION: caused insomnia  . Simvastatin     REACTION: non tolerant- leg pain  . Latex Rash  . Sulfonamide Derivatives Rash   Current Outpatient Prescriptions on File Prior to Visit  Medication Sig Dispense Refill  . acetaminophen (TYLENOL) 500 MG tablet Take 500 mg by mouth every 6 (six) hours as needed.      Marland Kitchen azithromycin (ZITHROMAX) 250 MG tablet 2 tabs a day for 1 day and then 1 a day for 4 days.  6 each  0  . calcium-vitamin D (OSCAL WITH D) 500-200 MG-UNIT per tablet Take 1 tablet by mouth daily.      . fexofenadine (ALLEGRA) 180 MG tablet Take 180 mg by mouth daily as needed (allergies).      . hydrochlorothiazide (HYDRODIURIL) 25 MG tablet Take 2 tablets (50 mg total) by mouth every morning.  60 tablet  1  . Omega-3 Fatty Acids (FISH OIL) 1200 MG CAPS Take 2 by mouth daily       . omeprazole (PRILOSEC) 20 MG capsule TAKE 1 CAPSULE BY MOUTH 2 TIMES DAILY.  180 capsule  1  . potassium chloride (K-DUR,KLOR-CON) 10 MEQ tablet Take 1 tablet (10 mEq total) by mouth daily. *follow-up appt. required with doctor before future refills are given*  30 tablet  1  . [DISCONTINUED] potassium chloride (K-DUR) 10 MEQ tablet Take 1 tablet (10 mEq total) by mouth daily.  30 tablet  3   No current  facility-administered medications on file prior to visit.    Review of Systems Review of Systems  Constitutional: Negative for fever, appetite change,  and unexpected weight change. pos for fatigue  ENt pos for rhinorrhea and sneezing  Eyes: Negative for pain and visual disturbance.  Respiratory: Negative for cough and shortness of breath.   Cardiovascular: Negative for cp or palpitations    Gastrointestinal: Negative for nausea, diarrhea and constipation.  Genitourinary: Negative for urgency and frequency. MSK pos for chronic back pain   Skin: Negative for pallor or rash   Neurological: Negative for weakness, light-headedness, numbness and headaches.  Hematological: Negative for adenopathy. Does not bruise/bleed easily.  Psychiatric/Behavioral: Negative for dysphoric mood. The patient is not nervous/anxious.  pos for grief        Objective:   Physical Exam  Constitutional: She appears well-developed and well-nourished. No distress.  Frail appearing kyphotic elderly female   HENT:  Head: Normocephalic and atraumatic.  Right Ear: External ear normal.  Left Ear: External ear normal.  Mouth/Throat: Oropharynx is clear and moist.  Nares are boggy   Eyes: Conjunctivae and EOM are normal. Pupils are equal, round, and reactive to light. Right eye exhibits no discharge. Left eye exhibits no discharge. No scleral icterus.  Neck: Normal range of motion. Neck supple. No JVD present. No thyromegaly present.  Cardiovascular: Normal rate, regular rhythm, normal heart sounds and intact distal pulses.  Exam reveals no gallop.   Pulmonary/Chest: Effort normal and breath sounds normal. No respiratory distress. She has no wheezes. She has no rales.  Abdominal: Soft. Bowel sounds are normal. She exhibits no distension and no mass. There is no tenderness.  Musculoskeletal: She exhibits no edema and no tenderness.  Limited rom TS and LS  Lymphadenopathy:    She has no cervical adenopathy.    Neurological: She is alert. She has normal reflexes. No cranial nerve deficit. She exhibits normal muscle tone. Coordination normal.  Skin: Skin is warm and dry. Rash noted. No erythema. No pallor.  Erythematous papular rash under both breasts-=worse on the L side  Non tender   Psychiatric: She has a normal mood and affect.          Assessment & Plan:   Problem List Items Addressed This Visit     Cardiovascular and Mediastinum   HYPERTENSION - Primary      bp in fair control at this time  BP Readings from Last 1 Encounters:  02/11/14 133/70   No changes needed Disc lifstyle change with low sodium diet and exercise   Lab today    Relevant Orders      CBC with Differential      Comprehensive metabolic panel      TSH      Lipid panel     Respiratory   ALLERGIC RHINITIS, SEASONAL     Pt is no longer getting immunotherapy - wants to get her astelin and flonase refilled  That was done She is fairly controlled with these and antihistamine      Musculoskeletal and Integument   Candidal intertrigo     Under breasts  Disc imp of keeping rash dry Nystatin cream px  Update if not starting to improve in a week or if worsening      Relevant Medications      nystatin cream (MYCOSTATIN)     Other   HYPERLIPIDEMIA     Lipid panel today  Diet controlled Diet is fair -disc low sat fat     Relevant Orders      Lipid panel   HYPOKALEMIA     K level today  On supplement No cramps       Relevant Orders      Comprehensive metabolic panel   Low back pain  Acute on chronic  ua today    Relevant Medications      traMADol (ULTRAM) tablet 50 mg   Other Relevant Orders      POCT urinalysis dipstick (Completed)      Urine culture    Other Visit Diagnoses   Abnormal urinalysis        Relevant Orders       Urine culture    Need for prophylactic vaccination and inoculation against influenza        Relevant Orders       Flu Vaccine QUAD 36+ mos PF IM (Fluarix Quad  PF) (Completed)    Need for 23-polyvalent pneumococcal polysaccharide vaccine        Relevant Orders       Pneumococcal polysaccharide vaccine 23-valent greater than or equal to 2yo subcutaneous/IM (Completed)

## 2014-02-11 NOTE — Assessment & Plan Note (Signed)
K level today  On supplement No cramps

## 2014-02-11 NOTE — Progress Notes (Signed)
Pre visit review using our clinic review tool, if applicable. No additional management support is needed unless otherwise documented below in the visit note. 

## 2014-02-11 NOTE — Assessment & Plan Note (Signed)
Acute on chronic  ua today

## 2014-02-12 LAB — COMPREHENSIVE METABOLIC PANEL
ALBUMIN: 3.9 g/dL (ref 3.5–5.2)
ALK PHOS: 58 U/L (ref 39–117)
ALT: 16 U/L (ref 0–35)
AST: 22 U/L (ref 0–37)
BILIRUBIN TOTAL: 0.6 mg/dL (ref 0.2–1.2)
BUN: 18 mg/dL (ref 6–23)
CO2: 30 mEq/L (ref 19–32)
Calcium: 9.4 mg/dL (ref 8.4–10.5)
Chloride: 96 mEq/L (ref 96–112)
Creatinine, Ser: 1 mg/dL (ref 0.4–1.2)
GFR: 57.91 mL/min — ABNORMAL LOW (ref >60.00)
GLUCOSE: 82 mg/dL (ref 70–99)
POTASSIUM: 3.3 meq/L — AB (ref 3.5–5.1)
SODIUM: 135 meq/L (ref 135–145)
Total Protein: 7 g/dL (ref 6.0–8.3)

## 2014-02-12 LAB — LDL CHOLESTEROL, DIRECT: Direct LDL: 344 mg/dL

## 2014-02-12 LAB — LIPID PANEL
CHOL/HDL RATIO: 11
Cholesterol: 431 mg/dL — ABNORMAL HIGH (ref 0–200)
HDL: 39.4 mg/dL (ref >39.00)
NonHDL: 391.6
Triglycerides: 325 mg/dL — ABNORMAL HIGH (ref 0.0–149.0)
VLDL: 65 mg/dL — AB (ref 0.0–40.0)

## 2014-02-12 LAB — TSH: TSH: 1.3 u[IU]/mL (ref 0.35–4.50)

## 2014-02-13 LAB — CBC WITH DIFFERENTIAL/PLATELET
Basophils Absolute: 0 10*3/uL (ref 0.0–0.1)
Basophils Relative: 0.5 % (ref 0.0–3.0)
EOS ABS: 0.2 10*3/uL (ref 0.0–0.7)
Eosinophils Relative: 3.1 % (ref 0.0–5.0)
HCT: 42.8 % (ref 36.0–46.0)
HEMOGLOBIN: 14.4 g/dL (ref 12.0–15.0)
LYMPHS ABS: 2.5 10*3/uL (ref 0.7–4.0)
Lymphocytes Relative: 34.9 % (ref 12.0–46.0)
MCHC: 33.7 g/dL (ref 30.0–36.0)
MCV: 93.4 fl (ref 78.0–100.0)
MONO ABS: 0.4 10*3/uL (ref 0.1–1.0)
Monocytes Relative: 5.6 % (ref 3.0–12.0)
Neutro Abs: 4 10*3/uL (ref 1.4–7.7)
Neutrophils Relative %: 55.9 % (ref 43.0–77.0)
PLATELETS: 298 10*3/uL (ref 150.0–400.0)
RBC: 4.58 Mil/uL (ref 3.87–5.11)
RDW: 13.7 % (ref 11.5–15.5)
WBC: 7.1 10*3/uL (ref 4.0–10.5)

## 2014-02-15 LAB — URINE CULTURE: Colony Count: 10000

## 2014-02-17 ENCOUNTER — Telehealth: Payer: Self-pay | Admitting: Family Medicine

## 2014-02-17 NOTE — Telephone Encounter (Signed)
Her urine has a small amount of bacteria  Please call in levaquin 250 mg 1 po qd for 5 d # 5 no refills  Let me know if any problems

## 2014-02-18 ENCOUNTER — Telehealth: Payer: Self-pay | Admitting: *Deleted

## 2014-02-18 MED ORDER — POTASSIUM CHLORIDE CRYS ER 10 MEQ PO TBCR: 10.0000 meq | EXTENDED_RELEASE_TABLET | Freq: Two times a day (BID) | ORAL | Status: DC

## 2014-02-18 MED ORDER — LEVOFLOXACIN 250 MG PO TABS: 250.0000 mg | ORAL_TABLET | Freq: Every day | ORAL | Status: DC

## 2014-02-18 NOTE — Telephone Encounter (Signed)
Rx sent and pt notified of urine cx results

## 2014-02-18 NOTE — Telephone Encounter (Signed)
Message copied by Shon Millet on Mon Feb 18, 2014  4:20 PM ------      Message from: Roxy Manns A      Created: Thu Feb 14, 2014  4:36 PM       Cholesterol is extremely high- I know she has been intolerant to several medicines in the past       Avoid red meat/ fried foods/ egg yolks/ fatty breakfast meats/ butter, cheese and high fat dairy/ and shellfish        K is a bit low- please inc her K pills to one pill twice daily- and call it in to her pharmacy please for 6 mo of refills       Pending final urine culture and then will update further ------

## 2014-02-18 NOTE — Telephone Encounter (Signed)
K pill Rx sent to pharmacy

## 2014-02-25 ENCOUNTER — Telehealth: Payer: Self-pay | Admitting: Family Medicine

## 2014-02-25 MED ORDER — KETOCONAZOLE 2 % EX CREA
1.0000 | TOPICAL_CREAM | Freq: Every day | CUTANEOUS | Status: DC
Start: 2014-02-25 — End: 2014-06-28

## 2014-02-25 MED ORDER — KETOCONAZOLE 2 % EX CREA: 1.0000 "application " | TOPICAL_CREAM | Freq: Every day | CUTANEOUS | Status: DC

## 2014-02-25 NOTE — Telephone Encounter (Signed)
I want to try a different cream - ketoconazole- if this does not help in the next week please update me so we can refer you to a dermatologist for further eval

## 2014-02-25 NOTE — Telephone Encounter (Signed)
Rx sent to pharmacy and pt advise of Dr. Royden Purl comments/recommendations.

## 2014-02-25 NOTE — Telephone Encounter (Signed)
Pt called to state that even after using the cream her rash is not getter much better and still itching a lot, especially at night. It comes and goes. Please advise.

## 2014-03-18 ENCOUNTER — Other Ambulatory Visit: Payer: Self-pay | Admitting: Family Medicine

## 2014-03-18 ENCOUNTER — Telehealth: Payer: Self-pay

## 2014-03-18 MED ORDER — PAROXETINE HCL 20 MG PO TABS: 20.0000 mg | ORAL_TABLET | ORAL | Status: DC

## 2014-03-18 NOTE — Telephone Encounter (Signed)
I just did it in resp to phone note, thanks

## 2014-03-18 NOTE — Telephone Encounter (Signed)
Pt advise of Dr. Royden Purlower's comments/recommendation and advise Rx sent

## 2014-03-18 NOTE — Telephone Encounter (Signed)
Will refill electronically  I wrote for 20 mg so if she wants to start with 1/2 pill for the first 2 weeks or so that is fine Update if side eff or problems

## 2014-03-18 NOTE — Telephone Encounter (Signed)
Electronic refill request, not on current med list, please advise  

## 2014-03-18 NOTE — Telephone Encounter (Signed)
Pt left v/m; pt wants to restart paxil; 02/11/14 office note said if pt decided to restart paxil to let Dr Milinda Antisower know. Pt request ref[facility] paxil to [facility].Please advise.

## 2014-03-28 ENCOUNTER — Other Ambulatory Visit: Payer: Self-pay | Admitting: Family Medicine

## 2014-04-15 ENCOUNTER — Telehealth: Payer: Self-pay

## 2014-04-15 DIAGNOSIS — B372 Candidiasis of skin and nail: Secondary | ICD-10-CM

## 2014-04-15 NOTE — Telephone Encounter (Signed)
Referral done

## 2014-04-15 NOTE — Telephone Encounter (Signed)
Pt left /vm requesting referral to dermatologist for yeast infection under breast; creams seemed to help for short time but back again and is worse. Pt has seen Alric [facility]ice Johnson at [facility]. Pt request cb.

## 2014-04-16 NOTE — Telephone Encounter (Signed)
Pt left v/m requesting cb about dermatology referral and pt request cb.

## 2014-05-20 ENCOUNTER — Other Ambulatory Visit: Payer: Self-pay | Admitting: Family Medicine

## 2014-06-28 ENCOUNTER — Encounter: Payer: Self-pay | Admitting: Internal Medicine

## 2014-06-28 ENCOUNTER — Ambulatory Visit (INDEPENDENT_AMBULATORY_CARE_PROVIDER_SITE_OTHER): Payer: PPO | Admitting: Internal Medicine

## 2014-06-28 ENCOUNTER — Ambulatory Visit (INDEPENDENT_AMBULATORY_CARE_PROVIDER_SITE_OTHER)
Admission: RE | Admit: 2014-06-28 | Discharge: 2014-06-28 | Disposition: A | Discharge status: Home / Self Care | Payer: PPO | Source: Ambulatory Visit | Attending: Internal Medicine | Admitting: Internal Medicine

## 2014-06-28 ENCOUNTER — Ambulatory Visit: Payer: Self-pay | Admitting: Internal Medicine

## 2014-06-28 VITALS — BP 126/74 | HR 74 | Temp 97.6°F | Wt 133.0 lb

## 2014-06-28 DIAGNOSIS — R319 Hematuria, unspecified: Secondary | ICD-10-CM

## 2014-06-28 DIAGNOSIS — R109 Unspecified abdominal pain: Secondary | ICD-10-CM | POA: Diagnosis not present

## 2014-06-28 DIAGNOSIS — R300 Dysuria: Secondary | ICD-10-CM | POA: Diagnosis not present

## 2014-06-28 LAB — POCT URINALYSIS DIPSTICK
Bilirubin, UA: NEGATIVE
GLUCOSE UA: NEGATIVE
Ketones, UA: NEGATIVE
Leukocytes, UA: NEGATIVE
Nitrite, UA: NEGATIVE
Protein, UA: NEGATIVE
SPEC GRAV UA: 1.025
Urobilinogen, UA: NEGATIVE
pH, UA: 5.5

## 2014-06-28 NOTE — Patient Instructions (Signed)

## 2014-06-28 NOTE — Progress Notes (Signed)
Pre visit review using our clinic review tool, if applicable. No additional management support is needed unless otherwise documented below in the visit note. 

## 2014-06-28 NOTE — Progress Notes (Signed)
HPI  Pt presents to the clinic today with c/o dysuria and low back pain. She reports her symptoms started this morning. The pain in her back is on the left side. She describes the pain and sharp and stabbing. It seems worse when she moves from a sitting to a standing position. She denies fever, chills or body aches. She has tried Tramadol with very minimal relief. She has had UTI's in the past and thinks this feels the same. She has not had any injury to her back that she is aware of.   Review of Systems  Past Medical History  Diagnosis Date  . Hyperlipemia 05/31/1992  . Hypertension 05/31/2000  . Osteopenia 11/29/1999  . Osteoarthritis   . Pedal edema     worsened by Norvasc  . Chronic foot pain     mortons neuroma  . Plantar fasciitis   . Menopausal symptoms     vasomotor symptoms-severe  . Cystocele 01/01    neg. sx  . Hemorrhoids   . fracture fibula ? 2003    left  . Complication of anesthesia   . PONV (postoperative nausea and vomiting)   . GERD (gastroesophageal reflux disease)     Family History  Problem Relation Age of Onset  . Diabetes Mother   . Heart disease Mother     pacemaker  . Hypertension Mother   . COPD Father   . Diabetes Sister   . Diabetes Brother   . Mental illness Son     anxiety-mental health problems  . Diabetes Brother   . Diabetes Sister   . Diabetes Sister   . Hypertension Other     History   Social History  . Marital Status: Married    Spouse Name: N/A    Number of Children: N/A  . Years of Education: N/A   Occupational History  . Not on file.   Social History Main Topics  . Smoking status: Never Smoker   . Smokeless tobacco: Never Used  . Alcohol Use: No  . Drug Use: No  . Sexual Activity: Not on file   Other Topics Concern  . Not on file   Social History Narrative   Allergic to TD   Does some volunteer visits to [facility]    Allergies  Allergen Reactions  . Tdap [Diphth-Acell Pertussis-Tetanus] Swelling  .  Amlodipine Besylate     REACTION: edema  . Codeine     headache  . Lovastatin     REACTION: leg pain  . Metronidazole Swelling    And body aches  . Penicillins     Unknown reaction  . Sertraline Hcl     REACTION: caused insomnia  . Simvastatin     REACTION: non tolerant- leg pain  . Latex Rash  . Sulfonamide Derivatives Rash    Constitutional: Denies fever, malaise, fatigue, headache or abrupt weight changes.   GU: Pt reports urgency, frequency and pain with urination. Denies burning sensation, blood in urine, odor or discharge. MSK: Pt reports low back pain. Skin: Denies redness, rashes, lesions or ulcercations.   No other specific complaints in a complete review of systems (except as listed in HPI above).    Objective:   Physical Exam  BP 126/74 mmHg  Pulse 74  Temp(Src) 97.6 F (36.4 C) (Oral)  Wt 133 lb (60.328 kg)  SpO2 97% Wt Readings from Last 3 Encounters:  06/28/14 133 lb (60.328 kg)  02/11/14 131 lb (59.421 kg)  11/14/13 130 lb 12 oz (  59.308 kg)    General: Appears her stated age, chronically ill appearing in NAD. Cardiovascular: Normal rate and rhythm. S1,S2 noted.  No murmur, rubs or gallops noted.  Pulmonary/Chest: Normal effort and positive vesicular breath sounds. No respiratory distress. No wheezes, rales or ronchi noted.  Abdomen: Soft and nontender. Normal bowel sounds, no bruits noted. No distention or masses noted. Liver, spleen and kidneys non palpable. No CVA tenderness. MSK: No pain with palpation of the thoracic or lumbar spine. No pain with palpation of the paralumbar muscles. Kyphotic. Normal flexion. Some pain noted with extension.     Assessment & Plan:  Dysuria and low back pain:   Urinalysis: 3 + blood ? Kidney stone Will check KUB of abdomen Advised her to try 2 tabs of the Tramadol every 8 hours for now Drink plenty of fluids  If worse over the weekend, go to UC  RTC as needed or if symptoms persist.

## 2014-07-01 ENCOUNTER — Encounter: Payer: Self-pay | Admitting: Family Medicine

## 2014-07-01 ENCOUNTER — Ambulatory Visit (INDEPENDENT_AMBULATORY_CARE_PROVIDER_SITE_OTHER): Payer: PPO | Admitting: Family Medicine

## 2014-07-01 ENCOUNTER — Telehealth: Payer: Self-pay | Admitting: *Deleted

## 2014-07-01 VITALS — BP 134/84 | HR 75 | Temp 98.3°F | Ht 62.0 in | Wt 135.8 lb

## 2014-07-01 DIAGNOSIS — R109 Unspecified abdominal pain: Secondary | ICD-10-CM

## 2014-07-01 DIAGNOSIS — R100 Acute abdomen: Secondary | ICD-10-CM | POA: Diagnosis not present

## 2014-07-01 LAB — POCT URINALYSIS DIPSTICK
BILIRUBIN UA: NEGATIVE
Bilirubin, UA: NEGATIVE
GLUCOSE UA: NEGATIVE
Glucose, UA: NEGATIVE
KETONES UA: NEGATIVE
Ketones, UA: NEGATIVE
Leukocytes, UA: NEGATIVE
NITRITE UA: NEGATIVE
Nitrite, UA: NEGATIVE
PROTEIN UA: NEGATIVE
Protein, UA: NEGATIVE
Spec Grav, UA: 1.025
Spec Grav, UA: 1.025
UROBILINOGEN UA: NEGATIVE
UROBILINOGEN UA: 0.2
pH, UA: 5.5
pH, UA: 6

## 2014-07-01 MED ORDER — CIPROFLOXACIN HCL 250 MG PO TABS: 250.0000 mg | ORAL_TABLET | Freq: Two times a day (BID) | ORAL | Status: DC

## 2014-07-01 NOTE — Addendum Note (Signed)
Addended by: Littie DeedsEVONTENNO, MELANIE Y on: 07/01/2014 02:00 PM   Modules accepted: Orders

## 2014-07-01 NOTE — Progress Notes (Signed)
   Subjective:    Patient ID: Amber Rollins, female    DOB: [birth year], 81 y.o.   MRN: [id number]  HPI Here for pain  Was seen by Rene Kocheregina on 1/29  She had blood in urine on dip then and today  No gross blood  Recommended 2 tramadol Q8 hours prn - she is afraid to take that   Xray of abdomen -no evidence of kidney stone   Now hurts in L flank area - sharp pain/ like a catch  At times it grabs her -painful  At other times no pain - like when resting  Often positional or movement related   No burning to urinate  Has baseline frequency and urgency  No odor in her urine   Some nausea  No fever  No appetite   Thinks she had a kidney stone one time     Review of Systems Review of Systems  Constitutional: Negative for fever, appetite change, fatigue and unexpected weight change.  Eyes: Negative for pain and visual disturbance.  Respiratory: Negative for cough and shortness of breath.   Cardiovascular: Negative for cp or palpitations    Gastrointestinal: Negative for nausea, diarrhea and constipation.  Genitourinary: Negative for urgency and pos for frequency/urgency, neg for gross hematuria MSK pos for L side/back pain  Skin: Negative for pallor or rash   Neurological: Negative for weakness, light-headedness, numbness and headaches.  Hematological: Negative for adenopathy. Does not bruise/bleed easily.  Psychiatric/Behavioral: Negative for dysphoric mood. The patient is not nervous/anxious.         Objective:   Physical Exam  Constitutional: She appears well-developed and well-nourished. No distress.  HENT:  Head: Normocephalic and atraumatic.  Mouth/Throat: Oropharynx is clear and moist.  Eyes: Conjunctivae and EOM are normal. Pupils are equal, round, and reactive to light. No scleral icterus.  Neck: Normal range of motion. Neck supple.  Cardiovascular: Normal rate and regular rhythm.   Pulmonary/Chest: Effort normal and breath sounds normal. No respiratory  distress. She has no wheezes. She has no rales.  Abdominal: Soft. Bowel sounds are normal. She exhibits no distension and no mass. There is tenderness. There is no rebound and no guarding.  Tender over L flank but not cva tenderness No suprapubic tenderness   Musculoskeletal: She exhibits no edema.  No vertebral tenderness  Lymphadenopathy:    She has no cervical adenopathy.  Neurological: She is alert.  Skin: Skin is warm and dry. No rash noted. No erythema.  Psychiatric: She has a normal mood and affect.          Assessment & Plan:   Problem List Items Addressed This Visit      Other   Left flank pain - Primary    This is not constant like a kidney stone Somewhat positional  However feels "like she felt with uti" also gen malaise Leukocytes on ua and rbc Cover with cipro cx  If neg/no imp- consider MSK etiol      Relevant Orders   Urine culture    Other Visit Diagnoses    Abdominal pain, acute        Relevant Orders    POCT urinalysis dipstick (Completed)

## 2014-07-01 NOTE — Patient Instructions (Signed)
You may have a urinary infection  I sent your urine for culture  Take cipro as directed  You can try a warm compress on your back -if symptoms worsen let me know  Drink lots of water

## 2014-07-01 NOTE — Progress Notes (Signed)
Pre visit review using our clinic review tool, if applicable. No additional management support is needed unless otherwise documented below in the visit note. 

## 2014-07-01 NOTE — Telephone Encounter (Signed)
Patient left a voicemail requesting a call back regarding the xray that she had done Friday.

## 2014-07-01 NOTE — Telephone Encounter (Signed)
Patient is aware of lab results.

## 2014-07-02 LAB — URINE CULTURE: Colony Count: 30000

## 2014-07-02 NOTE — Assessment & Plan Note (Signed)
This is not constant like a kidney stone Somewhat positional  However feels "like she felt with uti" also gen malaise Leukocytes on ua and rbc Cover with cipro cx  If neg/no imp- consider MSK etiol

## 2014-07-15 NOTE — Addendum Note (Signed)
Addended by: Roena MaladyEVONTENNO, Disney Ruggiero Y on: 07/15/2014 12:19 PM   Modules accepted: Orders

## 2014-07-25 ENCOUNTER — Other Ambulatory Visit: Payer: Self-pay | Admitting: Family Medicine

## 2014-07-25 NOTE — Telephone Encounter (Signed)
Ok to refill? Last prescribed on 02/11/14. Last seen on 07/01/14.

## 2014-07-25 NOTE — Telephone Encounter (Signed)
Px written for call in   

## 2014-07-25 NOTE — Telephone Encounter (Signed)
Called in rx to [facility].

## 2014-07-31 ENCOUNTER — Other Ambulatory Visit: Payer: Self-pay

## 2014-07-31 DIAGNOSIS — Z1231 Encounter for screening mammogram for malignant neoplasm of breast: Secondary | ICD-10-CM

## 2014-08-05 ENCOUNTER — Telehealth: Payer: Self-pay | Admitting: Family Medicine

## 2014-08-05 NOTE — Telephone Encounter (Signed)
Patient has a rash since "I dont know when".  Been treated with cream.  No relief.  She has gone to dermatologist, no help.  Econazole nitrate cream is what she has been using.  Also hydrocorisone cream.  Zeasorb powder has not helped either. She wants to get it cleared up before summer.  Advised patient she can be seen here or go back to dermatologist.  She wants to see Dr Milinda Antisower.  Appointment has been made.

## 2014-08-05 NOTE — Telephone Encounter (Signed)
Ms Hsiung wanted dr tower medical assistant to call her Pt wouldn't give me any more information  She said she has some ?

## 2014-08-08 ENCOUNTER — Ambulatory Visit
Admission: RE | Admit: 2014-08-08 | Discharge: 2014-08-08 | Disposition: A | Discharge status: Home / Self Care | Payer: PPO | Source: Ambulatory Visit

## 2014-08-08 DIAGNOSIS — Z1231 Encounter for screening mammogram for malignant neoplasm of breast: Secondary | ICD-10-CM

## 2014-08-09 ENCOUNTER — Ambulatory Visit (INDEPENDENT_AMBULATORY_CARE_PROVIDER_SITE_OTHER): Payer: PPO | Admitting: Family Medicine

## 2014-08-09 ENCOUNTER — Encounter: Payer: Self-pay | Admitting: Family Medicine

## 2014-08-09 VITALS — BP 140/80 | HR 74 | Temp 98.0°F | Ht 62.0 in | Wt 134.8 lb

## 2014-08-09 DIAGNOSIS — B0089 Other herpesviral infection: Secondary | ICD-10-CM

## 2014-08-09 MED ORDER — VALACYCLOVIR HCL 1 G PO TABS: 1000.0000 mg | ORAL_TABLET | Freq: Three times a day (TID) | ORAL | Status: DC

## 2014-08-09 NOTE — Progress Notes (Signed)
Pre visit review using our clinic review tool, if applicable. No additional management support is needed unless otherwise documented below in the visit note. 

## 2014-08-09 NOTE — Patient Instructions (Signed)
I think what you have on the right side may not be the same fungal rash you had in the other areas -because the other areas got better and this looks different than a typical fungal rash  I suspect this could be shingles or something similar to it Take valtrex as directed for 1 week- this may calm it down and make it get better quicker If no improvement in the next 2 weeks (or worse)- alert me and get back to the dermatologist  Please keep me updated

## 2014-08-09 NOTE — Assessment & Plan Note (Signed)
Suspect this is not fungal since it is not improving with the dermatologist's meds (and other areas did) Resembles shingles or other herpetic rash Will cover with valtrex - disc expectations and goals  Will update if worse or not imp in 2 wk  F/u derm if needed   Her intertrigo looks much better

## 2014-08-09 NOTE — Progress Notes (Signed)
Subjective:    Patient ID: Amber Rollins, female    DOB: 1931/01/07, 81 y.o.   MRN: [id number]  HPI Here with rash  Still under breasts -worse on R side  Very itchy    Has had nystatin and cortisone cream  Has seen derm  Was given econazole nitrate 1% cream  hydrocort cream 2.5% Zeasorb - keep moisture under control   Did not call dermatologist back to say it did not work The powder (otc) does better than any of the creama  The area under L breast and around the navel - are resolved Only the R side is still there   Patient Active Problem List   Diagnosis Date Noted  . Left flank pain 07/01/2014  . Candidal intertrigo 02/11/2014  . Bacterial vaginosis 10/08/2013  . Dizziness 04/11/2013  . Thoracic back pain 04/11/2013  . Urinary incontinence 12/27/2012  . Depression 09/24/2012  . Thoracic kyphosis 09/24/2012  . UTI (urinary tract infection) 08/12/2012  . Acute blood loss anemia 08/10/2012  . Fall 08/09/2012  . Sternal fracture 08/09/2012  . Left rib fracture 08/09/2012  . T8 vertebral fracture 08/09/2012  . T12 compression fracture 08/09/2012  . Laceration of lower leg 08/09/2012  . Hemorrhoid 12/24/2011  . Dysuria 12/24/2011  . Low back pain 12/24/2011  . Hypokalemia 10/06/2010  . HEMATURIA UNSPECIFIED 10/02/2009  . NIGHT SWEATS 10/02/2009  . GERD 06/18/2009  . INCONTINENCE, URGE 06/18/2009  . FATIGUE 03/12/2009  . HYPOKALEMIA 10/08/2008  . MENOPAUSAL SYNDROME 01/22/2008  . EDEMA 07/19/2007  . ANXIETY STATE, UNSPECIFIED 06/21/2007  . REACTION, ACUTE STRESS W/EMOTIONAL DSTURB 03/06/2007  . OVARIAN MASS 03/06/2007  . FOOT PAIN, CHRONIC 03/06/2007  . ALLERGIC RHINITIS, SEASONAL 09/12/2006  . IRRITABLE BOWEL SYNDROME 09/12/2006  . FIBROCYSTIC BREAST DISEASE 09/12/2006  . OSTEOARTHRITIS 09/12/2006  . HEMORRHOIDS, HX OF 09/12/2006  . HYSTERECTOMY, HX OF 09/12/2006  . HYPERTENSION 05/31/2000  . OSTEOPENIA 11/29/1999  . HYPERLIPIDEMIA 05/31/1992    Past Medical History  Diagnosis Date  . Hyperlipemia 05/31/1992  . Hypertension 05/31/2000  . Osteopenia 11/29/1999  . Osteoarthritis   . Pedal edema     worsened by Norvasc  . Chronic foot pain     mortons neuroma  . Plantar fasciitis   . Menopausal symptoms     vasomotor symptoms-severe  . Cystocele 01/01    neg. sx  . Hemorrhoids   . fracture fibula ? 2003    left  . Complication of anesthesia   . PONV (postoperative nausea and vomiting)   . GERD (gastroesophageal reflux disease)    Past Surgical History  Procedure Laterality Date  . Partial hysterectomy  1970    endometriosis  . Foot problems      neuromas  . Anterior repari w//surg proc  06/30/99    RSO  . History of ct      mass right adnexa, U?S by GYN-observation  . Bladder tack    . Bladder suspension    . Cataracts    . Kyphoplasty N/A 09/04/2012    Procedure: T7, T8,T12 Kyphoplasty ;  Surgeon: Clydene Fake, MD;  Location: [facility];  Service: Neurosurgery;  Laterality: N/A;  thoracic seven,eight, and twelve    History  Substance Use Topics  . Smoking status: Never Smoker   . Smokeless tobacco: Never Used  . Alcohol Use: No   Family History  Problem Relation Age of Onset  . Diabetes Mother   . Heart disease Mother  pacemaker  . Hypertension Mother   . COPD Father   . Diabetes Sister   . Diabetes Brother   . Mental illness Son     anxiety-mental health problems  . Diabetes Brother   . Diabetes Sister   . Diabetes Sister   . Hypertension Other    Allergies  Allergen Reactions  . Tdap [Diphth-Acell Pertussis-Tetanus] Swelling  . Amlodipine Besylate     REACTION: edema  . Codeine     headache  . Hydrocodone     hallucinations  . Lovastatin     REACTION: leg pain  . Metronidazole Swelling    And body aches  . Penicillins     Unknown reaction  . Sertraline Hcl     REACTION: caused insomnia  . Simvastatin     REACTION: non tolerant- leg pain  . Latex Rash  . Sulfonamide  Derivatives Rash   Current Outpatient Prescriptions on File Prior to Visit  Medication Sig Dispense Refill  . acetaminophen (TYLENOL) 500 MG tablet Take 500 mg by mouth every 6 (six) hours as needed.    Marland Kitchen. azelastine (ASTELIN) 0.1 % nasal spray Place 2 sprays into both nostrils 2 (two) times daily. Use in each nostril as directed 30 mL 11  . calcium-vitamin D (OSCAL WITH D) 500-200 MG-UNIT per tablet Take 1 tablet by mouth daily.    Marland Kitchen. econazole nitrate 1 % cream Apply 1 application topically daily.    . fexofenadine (ALLEGRA) 180 MG tablet Take 180 mg by mouth daily as needed (allergies).    . fluticasone (FLONASE) 50 MCG/ACT nasal spray Place 2 sprays into both nostrils daily. 1-2 sprays each nostril daily. 16 g 11  . hydrochlorothiazide (HYDRODIURIL) 25 MG tablet TAKE 2 TABLETS (50 MG TOTAL) BY MOUTH EVERY MORNING. 60 tablet 11  . hydrocortisone 2.5 % cream Apply 1 application topically daily.    . Omega-3 Fatty Acids (FISH OIL) 1200 MG CAPS Take 2 by mouth daily     . omeprazole (PRILOSEC) 20 MG capsule TAKE 1 CAPSULE BY MOUTH 2 TIMES DAILY. 180 capsule 1  . PARoxetine (PAXIL) 20 MG tablet Take 1 tablet (20 mg total) by mouth every morning. 30 tablet 5  . potassium chloride (K-DUR,KLOR-CON) 10 MEQ tablet Take 1 tablet (10 mEq total) by mouth 2 (two) times daily. 60 tablet 5  . traMADol (ULTRAM) 50 MG tablet TAKE 1 TABLET BY MOUTH EVERY 8 HOURS AS NEEDED FOR SEVERE PAIN. 30 tablet 3  . [DISCONTINUED] potassium chloride (K-DUR) 10 MEQ tablet Take 1 tablet (10 mEq total) by mouth daily. 30 tablet 3   No current facility-administered medications on file prior to visit.     Review of Systems    Review of Systems  Constitutional: Negative for fever, appetite change, fatigue and unexpected weight change.  Eyes: Negative for pain and visual disturbance.  Respiratory: Negative for cough and shortness of breath.   Cardiovascular: Negative for cp or palpitations    Gastrointestinal: Negative for  nausea, diarrhea and constipation.  Genitourinary: Negative for urgency and frequency.  Skin: Negative for pallor or rash  that is very itchy under R breast  Neurological: Negative for weakness, light-headedness, numbness and headaches.  Hematological: Negative for adenopathy. Does not bruise/bleed easily.  Psychiatric/Behavioral: Negative for dysphoric mood. The patient is not nervous/anxious.      Objective:   Physical Exam  Constitutional: She appears well-developed and well-nourished. No distress.  HENT:  Head: Normocephalic and atraumatic.  Eyes: Conjunctivae and EOM are  normal. Pupils are equal, round, and reactive to light.  Neck: Normal range of motion. Neck supple.  Pulmonary/Chest: Effort normal and breath sounds normal. No respiratory distress. She has no wheezes. She has no rales.  Lymphadenopathy:    She has no cervical adenopathy.  Neurological: She is alert. She has normal reflexes.  Skin: Skin is warm and dry. Rash noted. There is erythema.  Rash under R breast- patches of papules and vesicles Does not resemble her typical intertrigo   Psychiatric: She has a normal mood and affect.          Assessment & Plan:   Problem List Items Addressed This Visit      Musculoskeletal and Integument   Herpetic dermatitis - Primary    Suspect this is not fungal since it is not improving with the dermatologist's meds (and other areas did) Resembles shingles or other herpetic rash Will cover with valtrex - disc expectations and goals  Will update if worse or not imp in 2 wk  F/u derm if needed   Her intertrigo looks much better       Relevant Medications   miconazole (MICOTIN) 2 % powder   valACYclovir (VALTREX) tablet

## 2014-08-26 ENCOUNTER — Other Ambulatory Visit: Payer: Self-pay | Admitting: Family Medicine

## 2014-09-20 ENCOUNTER — Telehealth: Payer: Self-pay

## 2014-09-20 DIAGNOSIS — M2040 Other hammer toe(s) (acquired), unspecified foot: Secondary | ICD-10-CM | POA: Insufficient documentation

## 2014-09-20 NOTE — Telephone Encounter (Signed)
Referral done

## 2014-09-20 NOTE — Telephone Encounter (Signed)
Pt left v/m; pt having problem with hammer toe with corn on it; pt has seen podiatrist x 3 and now podiatrist wants to cut tendon in 2 weeks; pt is not happy with podiatrist and wants referral to Dr Eulah PontMurphy the orthopedic or another orthopedic of Dr Royden Purlower's choice. Pt request cb.

## 2014-09-20 NOTE — Telephone Encounter (Signed)
Left voicemail letting pt know referral done [facility]

## 2014-10-23 ENCOUNTER — Ambulatory Visit (INDEPENDENT_AMBULATORY_CARE_PROVIDER_SITE_OTHER): Payer: PPO | Admitting: Family Medicine

## 2014-10-23 ENCOUNTER — Encounter: Payer: Self-pay | Admitting: Family Medicine

## 2014-10-23 VITALS — BP 128/82 | HR 76 | Temp 98.0°F | Ht 62.0 in | Wt 135.5 lb

## 2014-10-23 DIAGNOSIS — R5382 Chronic fatigue, unspecified: Secondary | ICD-10-CM | POA: Diagnosis not present

## 2014-10-23 DIAGNOSIS — B0089 Other herpesviral infection: Secondary | ICD-10-CM

## 2014-10-23 DIAGNOSIS — R531 Weakness: Secondary | ICD-10-CM | POA: Insufficient documentation

## 2014-10-23 DIAGNOSIS — R350 Frequency of micturition: Secondary | ICD-10-CM | POA: Insufficient documentation

## 2014-10-23 LAB — POCT URINALYSIS DIPSTICK
Bilirubin, UA: NEGATIVE
Glucose, UA: NEGATIVE
LEUKOCYTES UA: NEGATIVE
NITRITE UA: NEGATIVE
Spec Grav, UA: >=1.03
Urobilinogen, UA: 0.2
pH, UA: 6

## 2014-10-23 NOTE — Assessment & Plan Note (Signed)
Worse lately and also some malaise  ua has ketones and high SG- disc poss of dehydration  She will aim for 8-10 twelve oz servings of water a day  Also dips pos for blood-sent for cx Update if symptoms worsen

## 2014-10-23 NOTE — Progress Notes (Signed)
Pre visit review using our clinic review tool, if applicable. No additional management support is needed unless otherwise documented below in the visit note. 

## 2014-10-23 NOTE — Patient Instructions (Signed)
Please try to get in at least 10 (12 oz) servings of water a day - do the best you can - dehydration may worsen urine symptoms and make you feel bad (also bad for the kidney)  We will culture your urine and see if you need an antibiotic based on that  Try to get as much as much help with things as possible - to give you time to care for yourself

## 2014-10-23 NOTE — Progress Notes (Signed)
Subjective:    Patient ID: Amber Rollins, female    DOB: [birth year], 81 y.o.   MRN: [id number]  HPI Here with urinary symptoms   Not feeling well  Urinary frequency - for about a week  Little burning to urinate No blood in urine  Back bothers her - different from usual- down a little lower- both sides   No fever  Fatigued/malaise  No nausea or vomiting     More irritable   Chronic itching R side - has seen dermatology Was told it is not shingles  No px given have helped   Results for orders placed or performed in visit on 10/23/14  POCT urinalysis dipstick  Result Value Ref Range   Color, UA Yellow    Clarity, UA Hazy    Glucose, UA Neg.    Bilirubin, UA Neg.    Ketones, UA Small    Spec Grav, UA >=1.030    Blood, UA Large    pH, UA 6.0    Protein, UA Trace    Urobilinogen, UA 0.2    Nitrite, UA Neg.    Leukocytes, UA Negative     ? If dehydrated  Drinks 3 servings of water per day 8-10 oz     Patient Active Problem List   Diagnosis Date Noted  . Hammer toe 09/20/2014  . Herpetic dermatitis 08/09/2014  . Left flank pain 07/01/2014  . Candidal intertrigo 02/11/2014  . Bacterial vaginosis 10/08/2013  . Dizziness 04/11/2013  . Thoracic back pain 04/11/2013  . Urinary incontinence 12/27/2012  . Depression 09/24/2012  . Thoracic kyphosis 09/24/2012  . UTI (urinary tract infection) 08/12/2012  . Acute blood loss anemia 08/10/2012  . Fall 08/09/2012  . Sternal fracture 08/09/2012  . Left rib fracture 08/09/2012  . T8 vertebral fracture 08/09/2012  . T12 compression fracture 08/09/2012  . Laceration of lower leg 08/09/2012  . Hemorrhoid 12/24/2011  . Dysuria 12/24/2011  . Low back pain 12/24/2011  . Hypokalemia 10/06/2010  . HEMATURIA UNSPECIFIED 10/02/2009  . NIGHT SWEATS 10/02/2009  . GERD 06/18/2009  . INCONTINENCE, URGE 06/18/2009  . FATIGUE 03/12/2009  . HYPOKALEMIA 10/08/2008  . MENOPAUSAL SYNDROME 01/22/2008  . EDEMA 07/19/2007  .  ANXIETY STATE, UNSPECIFIED 06/21/2007  . REACTION, ACUTE STRESS W/EMOTIONAL DSTURB 03/06/2007  . OVARIAN MASS 03/06/2007  . FOOT PAIN, CHRONIC 03/06/2007  . ALLERGIC RHINITIS, SEASONAL 09/12/2006  . IRRITABLE BOWEL SYNDROME 09/12/2006  . FIBROCYSTIC BREAST DISEASE 09/12/2006  . OSTEOARTHRITIS 09/12/2006  . HEMORRHOIDS, HX OF 09/12/2006  . HYSTERECTOMY, HX OF 09/12/2006  . HYPERTENSION 05/31/2000  . OSTEOPENIA 11/29/1999  . HYPERLIPIDEMIA 05/31/1992   Past Medical History  Diagnosis Date  . Hyperlipemia 05/31/1992  . Hypertension 05/31/2000  . Osteopenia 11/29/1999  . Osteoarthritis   . Pedal edema     worsened by Norvasc  . Chronic foot pain     mortons neuroma  . Plantar fasciitis   . Menopausal symptoms     vasomotor symptoms-severe  . Cystocele 01/01    neg. sx  . Hemorrhoids   . fracture fibula ? 2003    left  . Complication of anesthesia   . PONV (postoperative nausea and vomiting)   . GERD (gastroesophageal reflux disease)    Past Surgical History  Procedure Laterality Date  . Partial hysterectomy  1970    endometriosis  . Foot problems      neuromas  . Anterior repari w//surg proc  06/30/99    RSO  . History of  ct      mass right adnexa, U?S by GYN-observation  . Bladder tack    . Bladder suspension    . Cataracts    . Kyphoplasty N/A 09/04/2012    Procedure: T7, T8,T12 Kyphoplasty ;  Surgeon: Clydene Fake, MD;  Location: [facility];  Service: Neurosurgery;  Laterality: N/A;  thoracic seven,eight, and twelve    History  Substance Use Topics  . Smoking status: Never Smoker   . Smokeless tobacco: Never Used  . Alcohol Use: No   Family History  Problem Relation Age of Onset  . Diabetes Mother   . Heart disease Mother     pacemaker  . Hypertension Mother   . COPD Father   . Diabetes Sister   . Diabetes Brother   . Mental illness Son     anxiety-mental health problems  . Diabetes Brother   . Diabetes Sister   . Diabetes Sister   .  Hypertension Other    Allergies  Allergen Reactions  . Tdap [Diphth-Acell Pertussis-Tetanus] Swelling  . Amlodipine Besylate     REACTION: edema  . Codeine     headache  . Hydrocodone     hallucinations  . Lovastatin     REACTION: leg pain  . Metronidazole Swelling    And body aches  . Penicillins     Unknown reaction  . Sertraline Hcl     REACTION: caused insomnia  . Simvastatin     REACTION: non tolerant- leg pain  . Latex Rash  . Sulfonamide Derivatives Rash   Current Outpatient Prescriptions on File Prior to Visit  Medication Sig Dispense Refill  . azelastine (ASTELIN) 0.1 % nasal spray Place 2 sprays into both nostrils 2 (two) times daily. Use in each nostril as directed 30 mL 11  . fexofenadine (ALLEGRA) 180 MG tablet Take 180 mg by mouth daily as needed (allergies).    . fluticasone (FLONASE) 50 MCG/ACT nasal spray Place 2 sprays into both nostrils daily. 1-2 sprays each nostril daily. 16 g 11  . hydrochlorothiazide (HYDRODIURIL) 25 MG tablet TAKE 2 TABLETS (50 MG TOTAL) BY MOUTH EVERY MORNING. 60 tablet 11  . miconazole (MICOTIN) 2 % powder Apply topically as needed for itching.    Marland Kitchen omeprazole (PRILOSEC) 20 MG capsule TAKE 1 CAPSULE BY MOUTH 2 TIMES DAILY. 180 capsule 1  . potassium chloride (K-DUR) 10 MEQ tablet TAKE 1 TABLET BY MOUTH 2 TIMES DAILY. 180 tablet 3   No current facility-administered medications on file prior to visit.     Review of Systems Review of Systems  Constitutional: Negative for fever, appetite change, and unexpected weight change.  Eyes: Negative for pain and visual disturbance.  Respiratory: Negative for cough and shortness of breath.   Cardiovascular: Negative for cp or palpitations    Gastrointestinal: Negative for nausea, diarrhea and constipation.  Genitourinary: pos for urgency and frequency. neg for dysuria  Skin: Negative for pallor and pos for chronic R sided rash that itches  Neurological: Negative for weakness,  light-headedness, numbness and headaches.  Hematological: Negative for adenopathy. Does not bruise/bleed easily.  Psychiatric/Behavioral: Negative for dysphoric mood. The patient is more anxious and irritable lately       Objective:   Physical Exam  Constitutional: She appears well-developed and well-nourished. No distress.  Frail appearing elderly female  HENT:  Head: Normocephalic and atraumatic.  Eyes: Conjunctivae and EOM are normal. Pupils are equal, round, and reactive to light.  Neck: Normal range of motion. Neck  supple.  Cardiovascular: Normal rate, regular rhythm and normal heart sounds.   Pulmonary/Chest: Effort normal and breath sounds normal.  Abdominal: Soft. Bowel sounds are normal. She exhibits no distension. There is no tenderness. There is no rebound.  No cva tenderness  No  suprapubic tenderness  Musculoskeletal: She exhibits no edema.  Severe kyphosis   Lymphadenopathy:    She has no cervical adenopathy.  Neurological: She is alert.  Skin: No rash noted. No erythema. No pallor.  Papular rash R side/torso-baseline  Psychiatric: She has a normal mood and affect.  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 Urinary infx and dehydration  See AVS for advisement       Herpetic dermatitis   Urinary frequency - Primary    Worse lately and also some malaise  ua has ketones and high SG- disc poss of dehydration  She will aim for 8-10 twelve oz servings of water a day  Also dips pos for blood-sent for cx Update if symptoms worsen        Relevant Orders   POCT urinalysis dipstick (Completed)   Urine culture

## 2014-10-23 NOTE — Assessment & Plan Note (Signed)
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 Urinary infx and dehydration  See AVS for advisement

## 2014-10-25 ENCOUNTER — Telehealth: Payer: Self-pay | Admitting: Family Medicine

## 2014-10-25 LAB — URINE CULTURE
Colony Count: NO GROWTH
ORGANISM ID, BACTERIA: NO GROWTH

## 2014-10-25 MED ORDER — CYCLOBENZAPRINE HCL 10 MG PO TABS: 5.0000 mg | ORAL_TABLET | Freq: Three times a day (TID) | ORAL | Status: DC | PRN

## 2014-10-25 NOTE — Telephone Encounter (Signed)
-----   Message from Shon MilletShapale M [place], [place] sent at 10/25/2014  4:31 PM EDT ----- Pt notified of urine cx and Dr. Royden Purlower's comments/recommendations. Pt said that her back pain is worsening and she isn't sure what to do. Pt said she hasn't developed any new sxs. But the back pain is really bad, please advise.  Best # to call pt on is [phone] Cell#

## 2014-10-25 NOTE — Telephone Encounter (Signed)
It may be back muscle strain or other back problem If she is comfortable with trying it -please call in flexeril (caution of sedation) and inst to use gentle heat on back for 10 min at a time If severe- go to ER Otherwise f/u next week if no improvement

## 2014-10-25 NOTE — Telephone Encounter (Signed)
Pt notified of Dr. Royden Purlower's comments/ instructions. Pt does want to try flexeril so Rx sent to pharmacy and pt will f/u next week if no improvement

## 2014-11-28 ENCOUNTER — Telehealth: Payer: Self-pay | Admitting: *Deleted

## 2014-11-28 MED ORDER — MOMETASONE FUROATE 0.1 % EX CREA: TOPICAL_CREAM | CUTANEOUS | Status: DC

## 2014-11-28 NOTE — Telephone Encounter (Signed)
Pt calling stating she's been had a rash under her breast for years and have tried every cream and her friend is using a cream that she would like to try. Elocon cream, please advise

## 2014-11-28 NOTE — Telephone Encounter (Signed)
Rx sent to pharmacy and pt advise Rx sent and advise of Dr. Royden Purlower's recommendation/instructions.

## 2014-11-28 NOTE — Telephone Encounter (Signed)
Use caution - if you have a yeast rash this can make it worse so if worse please stop it  Please call in  Use 3 times per week max (it can thin the skin) Please keep me updated

## 2015-01-23 ENCOUNTER — Other Ambulatory Visit: Payer: Self-pay | Admitting: Family Medicine

## 2015-01-23 NOTE — Telephone Encounter (Signed)
Please refill for a year  

## 2015-01-23 NOTE — Telephone Encounter (Signed)
Ok to refill? It appears to have been taken off med list 5/16, but office note doesn't mention it.

## 2015-01-29 ENCOUNTER — Other Ambulatory Visit: Payer: Self-pay | Admitting: Family Medicine

## 2015-02-21 ENCOUNTER — Other Ambulatory Visit: Payer: Self-pay | Admitting: Family Medicine

## 2015-02-25 ENCOUNTER — Ambulatory Visit (INDEPENDENT_AMBULATORY_CARE_PROVIDER_SITE_OTHER): Payer: PPO

## 2015-02-25 DIAGNOSIS — Z23 Encounter for immunization: Secondary | ICD-10-CM | POA: Diagnosis not present

## 2015-03-11 ENCOUNTER — Other Ambulatory Visit: Payer: Self-pay | Admitting: Family Medicine

## 2015-03-24 ENCOUNTER — Ambulatory Visit (INDEPENDENT_AMBULATORY_CARE_PROVIDER_SITE_OTHER): Payer: PPO | Admitting: Family Medicine

## 2015-03-24 ENCOUNTER — Encounter: Payer: Self-pay | Admitting: Family Medicine

## 2015-03-24 VITALS — BP 114/78 | HR 68 | Temp 98.0°F | Ht 62.0 in | Wt 131.0 lb

## 2015-03-24 DIAGNOSIS — R8290 Unspecified abnormal findings in urine: Secondary | ICD-10-CM | POA: Diagnosis not present

## 2015-03-24 DIAGNOSIS — R112 Nausea with vomiting, unspecified: Secondary | ICD-10-CM | POA: Diagnosis not present

## 2015-03-24 LAB — POCT URINALYSIS DIPSTICK
Bilirubin, UA: NEGATIVE
GLUCOSE UA: NEGATIVE
Ketones, UA: NEGATIVE
NITRITE UA: NEGATIVE
Protein, UA: NEGATIVE
Spec Grav, UA: 1.02
UROBILINOGEN UA: 0.2
pH, UA: 6

## 2015-03-24 NOTE — Progress Notes (Signed)
Pre visit review using our clinic review tool, if applicable. No additional management support is needed unless otherwise documented below in the visit note. 

## 2015-03-24 NOTE — Progress Notes (Signed)
Subjective:    Patient ID: Amber Rollins, female    DOB: [birth year], 81 y.o.   MRN: [id number]  HPI  Here with hx of vomiting and diarrhea  Last week felt lousy  This am started vomiting and diarrhea  It slowed down after about an hour   Went to her great grandson's bday yesterday - ate some at the party (nothing after) Ate cut up veggies and nuts and ham biscuits   Vomited 3 times  Her diarrhea was profuse and watery - no blood in her stool   No fever  Did get chills   Feels better now -no longer nauseated  Last bm was this am  Not dizzy   She has been able to drink fluids -- some ginger ale this am Not hungry  She ate a bite of cereal and banana for bkfast and lunch    Would like to check her urine  No burning or other symptoms except urinary frequency (she has this a lot)  Patient Active Problem List   Diagnosis Date Noted  . Nausea with vomiting 03/24/2015  . Urinary frequency 10/23/2014  . Fatigue 10/23/2014  . Hammer toe 09/20/2014  . Herpetic dermatitis 08/09/2014  . Left flank pain 07/01/2014  . Candidal intertrigo 02/11/2014  . Bacterial vaginosis 10/08/2013  . Dizziness 04/11/2013  . Thoracic back pain 04/11/2013  . Urinary incontinence 12/27/2012  . Depression 09/24/2012  . Thoracic kyphosis 09/24/2012  . UTI (urinary tract infection) 08/12/2012  . Acute blood loss anemia 08/10/2012  . Fall 08/09/2012  . Sternal fracture 08/09/2012  . Left rib fracture 08/09/2012  . T8 vertebral fracture (HCC) 08/09/2012  . T12 compression fracture (HCC) 08/09/2012  . Laceration of lower leg 08/09/2012  . Hemorrhoid 12/24/2011  . Dysuria 12/24/2011  . Low back pain 12/24/2011  . Hypokalemia 10/06/2010  . HEMATURIA UNSPECIFIED 10/02/2009  . NIGHT SWEATS 10/02/2009  . GERD 06/18/2009  . INCONTINENCE, URGE 06/18/2009  . FATIGUE 03/12/2009  . HYPOKALEMIA 10/08/2008  . MENOPAUSAL SYNDROME 01/22/2008  . EDEMA 07/19/2007  . ANXIETY STATE, UNSPECIFIED  06/21/2007  . REACTION, ACUTE STRESS W/EMOTIONAL DSTURB 03/06/2007  . OVARIAN MASS 03/06/2007  . FOOT PAIN, CHRONIC 03/06/2007  . ALLERGIC RHINITIS, SEASONAL 09/12/2006  . IRRITABLE BOWEL SYNDROME 09/12/2006  . FIBROCYSTIC BREAST DISEASE 09/12/2006  . OSTEOARTHRITIS 09/12/2006  . HEMORRHOIDS, HX OF 09/12/2006  . HYSTERECTOMY, HX OF 09/12/2006  . HYPERTENSION 05/31/2000  . OSTEOPENIA 11/29/1999  . HYPERLIPIDEMIA 05/31/1992   Past Medical History  Diagnosis Date  . Hyperlipemia 05/31/1992  . Hypertension 05/31/2000  . Osteopenia 11/29/1999  . Osteoarthritis   . Pedal edema     worsened by Norvasc  . Chronic foot pain     mortons neuroma  . Plantar fasciitis   . Menopausal symptoms     vasomotor symptoms-severe  . Cystocele 01/01    neg. sx  . Hemorrhoids   . fracture fibula ? 2003    left  . Complication of anesthesia   . PONV (postoperative nausea and vomiting)   . GERD (gastroesophageal reflux disease)    Past Surgical History  Procedure Laterality Date  . Partial hysterectomy  1970    endometriosis  . Foot problems      neuromas  . Anterior repari w//surg proc  06/30/99    RSO  . History of ct      mass right adnexa, U?S by GYN-observation  . Bladder tack    . Bladder suspension    .  Cataracts    . Kyphoplasty N/A 09/04/2012    Procedure: T7, T8,T12 Kyphoplasty ;  Surgeon: Clydene Fake, MD;  Location: [facility];  Service: Neurosurgery;  Laterality: N/A;  thoracic seven,eight, and twelve    Social History  Substance Use Topics  . Smoking status: Never Smoker   . Smokeless tobacco: Never Used  . Alcohol Use: No   Family History  Problem Relation Age of Onset  . Diabetes Mother   . Heart disease Mother     pacemaker  . Hypertension Mother   . COPD Father   . Diabetes Sister   . Diabetes Brother   . Mental illness Son     anxiety-mental health problems  . Diabetes Brother   . Diabetes Sister   . Diabetes Sister   . Hypertension Other     Allergies  Allergen Reactions  . Tdap [Diphth-Acell Pertussis-Tetanus] Swelling  . Amlodipine Besylate     REACTION: edema  . Codeine     headache  . Hydrocodone     hallucinations  . Lovastatin     REACTION: leg pain  . Metronidazole Swelling    And body aches  . Penicillins     Unknown reaction  . Sertraline Hcl     REACTION: caused insomnia  . Simvastatin     REACTION: non tolerant- leg pain  . Latex Rash  . Sulfonamide Derivatives Rash   Current Outpatient Prescriptions on File Prior to Visit  Medication Sig Dispense Refill  . azelastine (ASTELIN) 0.1 % nasal spray PLACE 2 SPRAYS INTO BOTH NOSTRILS 2 TIMES DAILY. 30 mL 5  . fexofenadine (ALLEGRA) 180 MG tablet Take 180 mg by mouth daily as needed (allergies).    . fluticasone (FLONASE) 50 MCG/ACT nasal spray PLACE 1 TO 2 SPRAYS IN EACH NOSTRIL DAILY. 16 g 2  . hydrochlorothiazide (HYDRODIURIL) 25 MG tablet TAKE 2 TABLETS (50 MG TOTAL) BY MOUTH EVERY MORNING. 60 tablet 11  . miconazole (MICOTIN) 2 % powder Apply topically as needed for itching.    . mometasone (ELOCON) 0.1 % cream Apply to affected area in small amount 3 times weekly as needed 45 g 0  . omeprazole (PRILOSEC) 20 MG capsule TAKE 1 CAPSULE BY MOUTH 2 TIMES DAILY. 180 capsule 1  . potassium chloride (K-DUR) 10 MEQ tablet TAKE 1 TABLET BY MOUTH 2 TIMES DAILY. 180 tablet 3   No current facility-administered medications on file prior to visit.    Review of Systems    Review of Systems  Constitutional: Negative for fever, appetite change, fatigue and unexpected weight change.  Eyes: Negative for pain and visual disturbance.  Respiratory: Negative for cough and shortness of breath.   Cardiovascular: Negative for cp or palpitations    Gastrointestinal: Negative for constipation or blood in stool / pos for ext hemorrhoids  Genitourinary: Negative for urgency and pos for frequency/neg for hematuria  Skin: Negative for pallor or rash   Neurological: Negative  for weakness, light-headedness, numbness and headaches.  Hematological: Negative for adenopathy. Does not bruise/bleed easily.  Psychiatric/Behavioral: Negative for dysphoric mood. The patient is not nervous/anxious.      Objective:   Physical Exam  Constitutional: She appears well-developed and well-nourished. No distress.  Frail app elderly female   HENT:  Head: Normocephalic and atraumatic.  Eyes: Conjunctivae and EOM are normal. Pupils are equal, round, and reactive to light.  Neck: Normal range of motion. Neck supple.  Cardiovascular: Normal rate, regular rhythm and normal heart sounds.  Pulmonary/Chest: Effort normal and breath sounds normal.  Abdominal: Soft. She exhibits no distension and no mass. There is no tenderness. There is no rebound and no guarding.  No cva tenderness  Loud and overactive BS-not high pitched   Musculoskeletal: She exhibits no edema.  Lymphadenopathy:    She has no cervical adenopathy.  Neurological: She is alert.  Skin: No rash noted.  Psychiatric: She has a normal mood and affect.          Assessment & Plan:   Problem List Items Addressed This Visit      Digestive   Nausea with vomiting - Primary    Suspect viral  Improved now  Disc re hydration and BRAT diet until ready to tolerate more  Update if not starting to improve in a week or if worsening  - or if abd pain  ua borderline-sent for cx       Relevant Orders   POCT urinalysis dipstick (Completed)    Other Visit Diagnoses    Abnormal urinalysis        Relevant Orders    Urine culture

## 2015-03-24 NOTE — Assessment & Plan Note (Signed)
Suspect viral  Improved now  Disc re hydration and BRAT diet until ready to tolerate more  Update if not starting to improve in a week or if worsening  - or if abd pain  ua borderline-sent for cx

## 2015-03-24 NOTE — Patient Instructions (Signed)
I think you had a brief GI virus Glad you are feeling better Drink fluids in sips -- stir the bubbles out of the ginger ale  Then BRAT diet (bananas/rice /apple sauce and toast)  Gradually advance diet as tolerated Get some rest   Update if not starting to improve in a week or if worsening    Leave a urine specimen specimen on the way out as well

## 2015-03-25 LAB — URINE CULTURE

## 2015-06-23 ENCOUNTER — Other Ambulatory Visit: Payer: Self-pay | Admitting: Family Medicine

## 2015-06-23 DIAGNOSIS — I739 Peripheral vascular disease, unspecified: Secondary | ICD-10-CM | POA: Diagnosis not present

## 2015-06-23 DIAGNOSIS — L603 Nail dystrophy: Secondary | ICD-10-CM | POA: Diagnosis not present

## 2015-06-23 DIAGNOSIS — L84 Corns and callosities: Secondary | ICD-10-CM | POA: Diagnosis not present

## 2015-06-23 NOTE — Telephone Encounter (Signed)
Please schedule summer PE and refill until then

## 2015-06-23 NOTE — Telephone Encounter (Signed)
Last appt was for UTI on 03/24/15 but no recent f/u or CPE and no future appt., please advise

## 2015-06-24 NOTE — Telephone Encounter (Signed)
appt scheduled and med refilled 

## 2015-07-30 ENCOUNTER — Other Ambulatory Visit: Payer: Self-pay

## 2015-07-30 DIAGNOSIS — Z1231 Encounter for screening mammogram for malignant neoplasm of breast: Secondary | ICD-10-CM

## 2015-08-11 ENCOUNTER — Ambulatory Visit
Admission: RE | Admit: 2015-08-11 | Discharge: 2015-08-11 | Disposition: A | Discharge status: Home / Self Care | Payer: PPO | Source: Ambulatory Visit

## 2015-08-11 DIAGNOSIS — Z1231 Encounter for screening mammogram for malignant neoplasm of breast: Secondary | ICD-10-CM

## 2015-08-11 LAB — HM MAMMOGRAPHY: HM Mammogram: NORMAL

## 2015-08-13 ENCOUNTER — Encounter: Payer: Self-pay | Admitting: *Deleted

## 2015-08-22 ENCOUNTER — Other Ambulatory Visit: Payer: Self-pay | Admitting: Family Medicine

## 2015-09-08 ENCOUNTER — Other Ambulatory Visit: Payer: Self-pay | Admitting: Family Medicine

## 2015-10-28 ENCOUNTER — Encounter: Payer: Self-pay | Admitting: Family Medicine

## 2015-10-28 ENCOUNTER — Ambulatory Visit (INDEPENDENT_AMBULATORY_CARE_PROVIDER_SITE_OTHER): Payer: PPO | Admitting: Family Medicine

## 2015-10-28 VITALS — BP 122/70 | HR 73 | Temp 98.1°F | Ht 60.0 in | Wt 133.0 lb

## 2015-10-28 DIAGNOSIS — I10 Essential (primary) hypertension: Secondary | ICD-10-CM

## 2015-10-28 DIAGNOSIS — M2040 Other hammer toe(s) (acquired), unspecified foot: Secondary | ICD-10-CM | POA: Diagnosis not present

## 2015-10-28 DIAGNOSIS — R4589 Other symptoms and signs involving emotional state: Secondary | ICD-10-CM

## 2015-10-28 DIAGNOSIS — L03031 Cellulitis of right toe: Secondary | ICD-10-CM | POA: Diagnosis not present

## 2015-10-28 DIAGNOSIS — L0390 Cellulitis, unspecified: Secondary | ICD-10-CM | POA: Insufficient documentation

## 2015-10-28 MED ORDER — PAROXETINE HCL 10 MG PO TABS: 10.0000 mg | ORAL_TABLET | Freq: Every day | ORAL | Status: DC

## 2015-10-28 MED ORDER — DOXYCYCLINE HYCLATE 100 MG PO TABS: 100.0000 mg | ORAL_TABLET | Freq: Two times a day (BID) | ORAL | Status: DC

## 2015-10-28 NOTE — Progress Notes (Signed)
Pre visit review using our clinic review tool, if applicable. No additional management support is needed unless otherwise documented below in the visit note. 

## 2015-10-28 NOTE — Assessment & Plan Note (Signed)
R 3rd toe hammer deformity - recommend pad between this and first toe since 2nd toe drapes over first  Also needs nail debridement-rec f/u with podiatry Suspect mild cellulitis - tx with doxycycline and update

## 2015-10-28 NOTE — Patient Instructions (Addendum)
Take tylenol (acetaminophen) for pain  Elevate foot Continue epsom salts  Take doxycycline for toe infection  Keep seeing foot doctor for nail care  Take paxil 10 mg once daily- if any intolerable side effects or if you feel worse - stop it and let me know  Follow up in June as planned  Stay active and stay social   Update if not starting to improve in a week or if worsening

## 2015-10-28 NOTE — Assessment & Plan Note (Signed)
Long disc re: this with she and her son Reviewed stressors/ coping techniques/symptoms/ support sources/ tx options and side effects in detail today Some symptoms of depression and anxiety -w/o mood swings or irritability or SI Does have intrusive neg thoughts re: negative things in her life  Will try paxil (which she has tolerated in the past)- 10 mg daily  Discussed expectations of SSRI medication including time to effectiveness and mechanism of action, also poss of side effects (early and late)- including mental fuzziness, weight or appetite change, nausea and poss of worse dep or anxiety (even suicidal thoughts)  Pt voiced understanding and will stop med and update if this occurs   Enc counseling - she is not ready/will think about it Has support  >25 minutes spent in face to face time with patient, >50% spent in counselling or coordination of care F/u planned in June

## 2015-10-28 NOTE — Assessment & Plan Note (Signed)
R 3rd toe-distal/around thick nail that needs debridement  Hammer toe deformity Disc using a pad to protect it Cover with doxycycline and re check at f/u Enc elevation / soaks in epsom if helpful  Enc f/u with podiatry to debride nail  Update if not starting to improve in a week or if worsening

## 2015-10-28 NOTE — Assessment & Plan Note (Signed)
Better on 2nd check  bp in fair control at this time  BP Readings from Last 1 Encounters:  10/28/15 122/70   No changes needed Disc lifstyle change with low sodium diet and exercise

## 2015-10-28 NOTE — Progress Notes (Signed)
Subjective:    Patient ID: Amber Rollins, female    DOB: 02/24/1931, 82 y.o.   MRN: [id number]  HPI Here for foot problems and also issues with her mental state   Right foot  2nd toe crosses over the first  3rd toe is red and painful  Saturday night -it throbbed a bit and kept her up  No trauma or new shoes   She elevated her foot  Did not have any pain med at home  Out of tramadol  Soaked in epsom salt  Is about the same She can walk but it hurts to bear weight   Has never had gout in the past   No new activities   Has had 4 morton's neuroma in the past   Her family is here to disc her mental state  A lot of stress and loss lately  Also a lot of problems with her house   Son is bipolar (he is here today)  A lot of mental illness in the family  Father was an alcoholic   Her daughter avoids her due to negative attitude  Used to be more cheerful and things have changed  Gets sad often   Gets joy out of shopping /church/socializing/ getting hair fixed   She does get anxious - son says something "is always bothering her"  Is a worrier  Not really irritable    Has never had a counselor in the past -not interested in one now   Patient Active Problem List   Diagnosis Date Noted  . Cellulitis 10/28/2015  . Nausea with vomiting 03/24/2015  . Urinary frequency 10/23/2014  . Fatigue 10/23/2014  . Hammer toe 09/20/2014  . Herpetic dermatitis 08/09/2014  . Left flank pain 07/01/2014  . Candidal intertrigo 02/11/2014  . Bacterial vaginosis 10/08/2013  . Dizziness 04/11/2013  . Thoracic back pain 04/11/2013  . Urinary incontinence 12/27/2012  . Depression 09/24/2012  . Thoracic kyphosis 09/24/2012  . UTI (urinary tract infection) 08/12/2012  . Acute blood loss anemia 08/10/2012  . Fall 08/09/2012  . Sternal fracture 08/09/2012  . Left rib fracture 08/09/2012  . T8 vertebral fracture (HCC) 08/09/2012  . T12 compression fracture (HCC) 08/09/2012  .  Laceration of lower leg 08/09/2012  . Hemorrhoid 12/24/2011  . Dysuria 12/24/2011  . Low back pain 12/24/2011  . Hypokalemia 10/06/2010  . HEMATURIA UNSPECIFIED 10/02/2009  . NIGHT SWEATS 10/02/2009  . GERD 06/18/2009  . INCONTINENCE, URGE 06/18/2009  . FATIGUE 03/12/2009  . HYPOKALEMIA 10/08/2008  . MENOPAUSAL SYNDROME 01/22/2008  . EDEMA 07/19/2007  . ANXIETY STATE, UNSPECIFIED 06/21/2007  . REACTION, ACUTE STRESS W/EMOTIONAL DSTURB 03/06/2007  . OVARIAN MASS 03/06/2007  . FOOT PAIN, CHRONIC 03/06/2007  . ALLERGIC RHINITIS, SEASONAL 09/12/2006  . IRRITABLE BOWEL SYNDROME 09/12/2006  . FIBROCYSTIC BREAST DISEASE 09/12/2006  . OSTEOARTHRITIS 09/12/2006  . HEMORRHOIDS, HX OF 09/12/2006  . HYSTERECTOMY, HX OF 09/12/2006  . Essential hypertension 05/31/2000  . OSTEOPENIA 11/29/1999  . HYPERLIPIDEMIA 05/31/1992   Past Medical History  Diagnosis Date  . Hyperlipemia 05/31/1992  . Hypertension 05/31/2000  . Osteopenia 11/29/1999  . Osteoarthritis   . Pedal edema     worsened by Norvasc  . Chronic foot pain     mortons neuroma  . Plantar fasciitis   . Menopausal symptoms     vasomotor symptoms-severe  . Cystocele 01/01    neg. sx  . Hemorrhoids   . fracture fibula ? 2003    left  . Complication  of anesthesia   . PONV (postoperative nausea and vomiting)   . GERD (gastroesophageal reflux disease)    Past Surgical History  Procedure Laterality Date  . Partial hysterectomy  1970    endometriosis  . Foot problems      neuromas  . Anterior repari w//surg proc  06/30/99    RSO  . History of ct      mass right adnexa, U?S by GYN-observation  . Bladder tack    . Bladder suspension    . Cataracts    . Kyphoplasty N/A 09/04/2012    Procedure: T7, T8,T12 Kyphoplasty ;  Surgeon: Clydene FakeJames R Hirsch, MD;  Location: [facility];  Service: Neurosurgery;  Laterality: N/A;  thoracic seven,eight, and twelve    Social History  Substance Use Topics  . Smoking status: Never Smoker     . Smokeless tobacco: Never Used  . Alcohol Use: No   Family History  Problem Relation Age of Onset  . Diabetes Mother   . Heart disease Mother     pacemaker  . Hypertension Mother   . COPD Father   . Diabetes Sister   . Diabetes Brother   . Mental illness Son     anxiety-mental health problems  . Diabetes Brother   . Diabetes Sister   . Diabetes Sister   . Hypertension Other    Allergies  Allergen Reactions  . Tdap [Diphth-Acell Pertussis-Tetanus] Swelling  . Amlodipine Besylate     REACTION: edema  . Codeine     headache  . Hydrocodone     hallucinations  . Lovastatin     REACTION: leg pain  . Metronidazole Swelling    And body aches  . Penicillins     Unknown reaction  . Sertraline Hcl     REACTION: caused insomnia  . Simvastatin     REACTION: non tolerant- leg pain  . Latex Rash  . Sulfonamide Derivatives Rash   Current Outpatient Prescriptions on File Prior to Visit  Medication Sig Dispense Refill  . azelastine (ASTELIN) 0.1 % nasal spray PLACE 2 SPRAYS INTO BOTH NOSTRILS 2 TIMES DAILY. 30 mL 5  . Cholecalciferol (VITAMIN D3) 2000 UNITS TABS Take 1 tablet by mouth 2 (two) times daily.    . fexofenadine (ALLEGRA) 180 MG tablet Take 180 mg by mouth daily as needed (allergies).    . fluticasone (FLONASE) 50 MCG/ACT nasal spray PLACE 1 TO 2 SPRAYS IN EACH NOSTRIL DAILY. 16 g 2  . hydrochlorothiazide (HYDRODIURIL) 25 MG tablet TAKE 2 TABLETS BY MOUTH EVERY MORNING. 60 tablet 3  . mometasone (ELOCON) 0.1 % cream Apply to affected area in small amount 3 times weekly as needed 45 g 0  . omeprazole (PRILOSEC) 20 MG capsule TAKE 1 CAPSULE BY MOUTH 2 TIMES DAILY. (Patient taking differently: TAKE 1 CAPSULE BY MOUTH ONCE DAILY (MAY TAKE 2ND TAB IF NEEDED)) 180 capsule 1  . potassium chloride (K-DUR) 10 MEQ tablet TAKE 1 TABLET BY MOUTH 2 TIMES DAILY. 180 tablet 1   No current facility-administered medications on file prior to visit.     Review of Systems Review of  Systems  Constitutional: Negative for fever, appetite change, fatigue and unexpected weight change.  Eyes: Negative for pain and visual disturbance.  Respiratory: Negative for cough and shortness of breath.   Cardiovascular: Negative for cp or palpitations    Gastrointestinal: Negative for nausea, diarrhea and constipation.  Genitourinary: Negative for urgency and frequency.  Skin: Negative for pallor or rash  Pos for redness of R 3rd toe with pain   Neurological: Negative for weakness, light-headedness, numbness and headaches.  MSK pos for chronic back pain  Hematological: Negative for adenopathy. Does not bruise/bleed easily.  Psychiatric/Behavioral: pos for depressed and anxious mood without SI        Objective:   Physical Exam  Constitutional: She appears well-developed and well-nourished. No distress.  Frail appearing elderly female   HENT:  Head: Normocephalic and atraumatic.  Mouth/Throat: Oropharynx is clear and moist.  Eyes: Conjunctivae and EOM are normal. Pupils are equal, round, and reactive to light.  Neck: Normal range of motion. Neck supple. No JVD present. Carotid bruit is not present. No thyromegaly present.  Cardiovascular: Normal rate, regular rhythm, normal heart sounds and intact distal pulses.  Exam reveals no gallop.   Pulmonary/Chest: Effort normal and breath sounds normal. No respiratory distress. She has no wheezes. She has no rales.  No crackles  Abdominal: Soft. Bowel sounds are normal. She exhibits no distension, no abdominal bruit and no mass. There is no tenderness.  Musculoskeletal: She exhibits no edema.  R foot 2nd toe crosses over first 3rd toe abuts first- with callus and hammer toe deformity and dystrophic nail  Lymphadenopathy:    She has no cervical adenopathy.  Neurological: She is alert. She has normal reflexes.  Skin: Skin is warm and dry. No rash noted.  R 3rd toe-erythema distally around nail which is dystrophic No discharge or  fluctuance Mildly tender  Callus under head of 3rd metatarsal also due to hammer toe deformity  Psychiatric: Her speech is normal and behavior is normal. Thought content normal. Her mood appears not anxious. Her affect is not blunt, not labile and not inappropriate. She is not agitated, not slowed and not withdrawn. Thought content is not paranoid. Cognition and memory are normal. She exhibits a depressed mood. She expresses no homicidal and no suicidal ideation.  Son is here-he is helpful  Pt is eager to discuss stressors Has a generally negative outlook Not tearful           Assessment & Plan:   Problem List Items Addressed This Visit      Cardiovascular and Mediastinum   Essential hypertension - Primary    Better on 2nd check  bp in fair control at this time  BP Readings from Last 1 Encounters:  10/28/15 122/70   No changes needed Disc lifstyle change with low sodium diet and exercise          Musculoskeletal and Integument   Hammer toe    R 3rd toe hammer deformity - recommend pad between this and first toe since 2nd toe drapes over first  Also needs nail debridement-rec f/u with podiatry Suspect mild cellulitis - tx with doxycycline and update         Other   REACTION, ACUTE STRESS W/EMOTIONAL DSTURB    Long disc re: this with she and her son Reviewed stressors/ coping techniques/symptoms/ support sources/ tx options and side effects in detail today Some symptoms of depression and anxiety -w/o mood swings or irritability or SI Does have intrusive neg thoughts re: negative things in her life  Will try paxil (which she has tolerated in the past)- 10 mg daily  Discussed expectations of SSRI medication including time to effectiveness and mechanism of action, also poss of side effects (early and late)- including mental fuzziness, weight or appetite change, nausea and poss of worse dep or anxiety (even suicidal thoughts)  Pt voiced  understanding and will stop med and  update if this occurs   Enc counseling - she is not ready/will think about it Has support  >25 minutes spent in face to face time with patient, >50% spent in counselling or coordination of care F/u planned in June       Cellulitis    R 3rd toe-distal/around thick nail that needs debridement  Hammer toe deformity Disc using a pad to protect it Cover with doxycycline and re check at f/u Enc elevation / soaks in epsom if helpful  Enc f/u with podiatry to debride nail  Update if not starting to improve in a week or if worsening

## 2015-11-10 ENCOUNTER — Ambulatory Visit (INDEPENDENT_AMBULATORY_CARE_PROVIDER_SITE_OTHER): Payer: PPO | Admitting: Family Medicine

## 2015-11-10 ENCOUNTER — Encounter: Payer: Self-pay | Admitting: Family Medicine

## 2015-11-10 VITALS — BP 134/70 | HR 66 | Temp 98.0°F | Ht 59.25 in | Wt 132.8 lb

## 2015-11-10 DIAGNOSIS — M40204 Unspecified kyphosis, thoracic region: Secondary | ICD-10-CM

## 2015-11-10 DIAGNOSIS — Z23 Encounter for immunization: Secondary | ICD-10-CM | POA: Diagnosis not present

## 2015-11-10 DIAGNOSIS — M949 Disorder of cartilage, unspecified: Secondary | ICD-10-CM | POA: Diagnosis not present

## 2015-11-10 DIAGNOSIS — E785 Hyperlipidemia, unspecified: Secondary | ICD-10-CM

## 2015-11-10 DIAGNOSIS — Z0000 Encounter for general adult medical examination without abnormal findings: Secondary | ICD-10-CM | POA: Diagnosis not present

## 2015-11-10 DIAGNOSIS — M899 Disorder of bone, unspecified: Secondary | ICD-10-CM

## 2015-11-10 DIAGNOSIS — I10 Essential (primary) hypertension: Secondary | ICD-10-CM

## 2015-11-10 DIAGNOSIS — R4589 Other symptoms and signs involving emotional state: Secondary | ICD-10-CM

## 2015-11-10 MED ORDER — MOMETASONE FUROATE 0.1 % EX CREA: TOPICAL_CREAM | CUTANEOUS | Status: DC

## 2015-11-10 NOTE — Patient Instructions (Addendum)
We will set you up to do a cologuard kit for colon cancer screening I refilled your cream  We will schedule you for a medicare interview visit with Katha Cabal sometime this summer  Stick with the paxil and if you don't have improvement in mood or sleepiness in 2 more weeks please let me know Eat your fruits and veggies for potassium  Also - stay active  Labs today

## 2015-11-10 NOTE — Assessment & Plan Note (Signed)
So far 10 mg of paxil is making her sleepy- but beginning to help mood slightly  She has a more positive outlook today Reviewed stressors/ coping techniques/symptoms/ support sources/ tx options and side effects in detail today In 2 weeks if still do sedating -will adj or change tx

## 2015-11-10 NOTE — Assessment & Plan Note (Signed)
History of compression fractures and also traumatic ones incl sternum and T8 and T12 Now healed  Some chronic pain Declines tx for osteopenia Declines dexa this year

## 2015-11-10 NOTE — Assessment & Plan Note (Signed)
Osteopenia with hx of traumatic fractures She declines another dexa currently (last one in 2014) No falls  Rev ca and /d D level today  Disc need for calcium/ vitamin D/ wt bearing exercise and bone density test every 2 y to monitor Disc safety/ fracture risk in detail

## 2015-11-10 NOTE — Assessment & Plan Note (Signed)
bp in fair control at this time  BP Readings from Last 1 Encounters:  11/10/15 134/70   No changes needed Disc lifstyle change with low sodium diet and exercise  Labs today

## 2015-11-10 NOTE — Progress Notes (Signed)
Pre visit review using our clinic review tool, if applicable. No additional management support is needed unless otherwise documented below in the visit note. 

## 2015-11-10 NOTE — Assessment & Plan Note (Signed)
Pt declines further testing Severely high cholesterol- and intolerant of statins Diet is fair/ rev low sat fat diet  At her age would rather not tx or measure it

## 2015-11-10 NOTE — Progress Notes (Signed)
Subjective:    Patient ID: Amber Rollins, female    DOB: 10/30/1930, 82 y.o.   MRN: [id number]  HPI Here for health maintenance exam and to review chronic medical problems    Last visit here we started her on paxil for mood problems caused by stress  10 mg-low dose  She is sleepy more of the time-does not like that  Is sleeping well however  Mood has not changed yet -has only been 2 weeks  Perhaps her outlook is a little more positive   Otherwise feeling fine   Wt is stable with bmi of 26  Zoster vaccine -not interested, she has had the shingles already (knows she could get it again)   Due for PCV 13= will get that today   Mammogram 3/17-normal Self breast exam -no lumps   dexa 8/14 -osteopenia of LFN Sternal and rib fx after a major fall Kyphosis and lost ht  Takes 4000 iu of D per day  She does not want to set up next dexa at this time No falls / is very careful   Colonoscopy was 12/99 Is open to doing a cologuard test  bp is stable today  No cp or palpitations or headaches or edema  No side effects to medicines  BP Readings from Last 3 Encounters:  11/10/15 134/70  10/28/15 122/70  03/24/15 114/78        Chemistry      Component Value Date/Time   NA 135 02/11/2014 1552   K 3.3* 02/11/2014 1552   CL 96 02/11/2014 1552   CO2 30 02/11/2014 1552   BUN 18 02/11/2014 1552   CREATININE 1.0 02/11/2014 1552   CREATININE 1.03 12/24/2011 1514      Component Value Date/Time   CALCIUM 9.4 02/11/2014 1552   ALKPHOS 58 02/11/2014 1552   AST 22 02/11/2014 1552   ALT 16 02/11/2014 1552   BILITOT 0.6 02/11/2014 1552      No missed doses of K No cramps  Will work on fruits and veg (appetite is good)   Hx of hyperlipidemia Lab Results  Component Value Date   CHOL 431* 02/11/2014   CHOL 259* 04/17/2008   CHOL 261* 10/16/2007   Lab Results  Component Value Date   HDL 39.40 02/11/2014   HDL 49.7 04/17/2008   HDL 57.1 10/16/2007   No results found  for: [facility] Lab Results  Component Value Date   TRIG 325.0* 02/11/2014   TRIG 132 04/17/2008   TRIG 203* 10/16/2007   Lab Results  Component Value Date   CHOLHDL 11 02/11/2014   CHOLHDL 5.2 CALC 04/17/2008   CHOLHDL 4.6 CALC 10/16/2007   Lab Results  Component Value Date   LDLDIRECT 344.0 02/11/2014   LDLDIRECT 168.3 04/17/2008   LDLDIRECT 170.6 10/16/2007   can't tolerate statins at all Declines all treatment   Wants to get labs drawn today   Patient Active Problem List   Diagnosis Date Noted  . Routine general medical examination at a health care facility 11/10/2015  . Cellulitis 10/28/2015  . Nausea with vomiting 03/24/2015  . Urinary frequency 10/23/2014  . Fatigue 10/23/2014  . Hammer toe 09/20/2014  . Herpetic dermatitis 08/09/2014  . Left flank pain 07/01/2014  . Candidal intertrigo 02/11/2014  . Dizziness 04/11/2013  . Thoracic back pain 04/11/2013  . Urinary incontinence 12/27/2012  . Depression 09/24/2012  . Thoracic kyphosis 09/24/2012  . Acute blood loss anemia 08/10/2012  . Fall 08/09/2012  . Sternal fracture  08/09/2012  . Left rib fracture 08/09/2012  . T8 vertebral fracture ([place]) 08/09/2012  . T12 compression fracture ([place]) 08/09/2012  . Laceration of lower leg 08/09/2012  . Hemorrhoid 12/24/2011  . Dysuria 12/24/2011  . Low back pain 12/24/2011  . Hypokalemia 10/06/2010  . HEMATURIA UNSPECIFIED 10/02/2009  . NIGHT SWEATS 10/02/2009  . GERD 06/18/2009  . INCONTINENCE, URGE 06/18/2009  . FATIGUE 03/12/2009  . HYPOKALEMIA 10/08/2008  . MENOPAUSAL SYNDROME 01/22/2008  . EDEMA 07/19/2007  . ANXIETY STATE, UNSPECIFIED 06/21/2007  . REACTION, ACUTE STRESS W/EMOTIONAL DSTURB 03/06/2007  . OVARIAN MASS 03/06/2007  . FOOT PAIN, CHRONIC 03/06/2007  . ALLERGIC RHINITIS, SEASONAL 09/12/2006  . IRRITABLE BOWEL SYNDROME 09/12/2006  . FIBROCYSTIC BREAST DISEASE 09/12/2006  . OSTEOARTHRITIS 09/12/2006  . HEMORRHOIDS, HX OF 09/12/2006  . HYSTERECTOMY,  HX OF 09/12/2006  . Essential hypertension 05/31/2000  . Disorder of bone and cartilage 11/29/1999  . Hyperlipidemia 05/31/1992   Past Medical History  Diagnosis Date  . Hyperlipemia 05/31/1992  . Hypertension 05/31/2000  . Osteopenia 11/29/1999  . Osteoarthritis   . Pedal edema     worsened by Norvasc  . Chronic foot pain     mortons neuroma  . Plantar fasciitis   . Menopausal symptoms     vasomotor symptoms-severe  . Cystocele 01/01    neg. sx  . Hemorrhoids   . fracture fibula ? 2003    left  . Complication of anesthesia   . PONV (postoperative nausea and vomiting)   . GERD (gastroesophageal reflux disease)    Past Surgical History  Procedure Laterality Date  . Partial hysterectomy  1970    endometriosis  . Foot problems      neuromas  . Anterior repari w//surg proc  06/30/99    RSO  . History of ct      mass right adnexa, U?S by GYN-observation  . Bladder tack    . Bladder suspension    . Cataracts    . Kyphoplasty N/A 09/04/2012    Procedure: T7, T8,T12 Kyphoplasty ;  Surgeon: Otilio Connors, MD;  Location: [facility];  Service: Neurosurgery;  Laterality: N/A;  thoracic seven,eight, and twelve    Social History  Substance Use Topics  . Smoking status: Never Smoker   . Smokeless tobacco: Never Used  . Alcohol Use: No   Family History  Problem Relation Age of Onset  . Diabetes Mother   . Heart disease Mother     pacemaker  . Hypertension Mother   . COPD Father   . Diabetes Sister   . Diabetes Brother   . Mental illness Son     anxiety-mental health problems  . Diabetes Brother   . Diabetes Sister   . Diabetes Sister   . Hypertension Other    Allergies  Allergen Reactions  . Tdap [Diphth-Acell Pertussis-Tetanus] Swelling  . Amlodipine Besylate     REACTION: edema  . Codeine     headache  . Hydrocodone     hallucinations  . Lovastatin     REACTION: leg pain  . Metronidazole Swelling    And body aches  . Penicillins     Unknown reaction    . Sertraline Hcl     REACTION: caused insomnia  . Simvastatin     REACTION: non tolerant- leg pain  . Latex Rash  . Sulfonamide Derivatives Rash   Current Outpatient Prescriptions on File Prior to Visit  Medication Sig Dispense Refill  . azelastine (ASTELIN) 0.1 % nasal  spray PLACE 2 SPRAYS INTO BOTH NOSTRILS 2 TIMES DAILY. 30 mL 5  . Cholecalciferol (VITAMIN D3) 2000 UNITS TABS Take 1 tablet by mouth 2 (two) times daily.    . fexofenadine (ALLEGRA) 180 MG tablet Take 180 mg by mouth daily as needed (allergies).    . fluticasone (FLONASE) 50 MCG/ACT nasal spray PLACE 1 TO 2 SPRAYS IN EACH NOSTRIL DAILY. 16 g 2  . hydrochlorothiazide (HYDRODIURIL) 25 MG tablet TAKE 2 TABLETS BY MOUTH EVERY MORNING. 60 tablet 3  . omeprazole (PRILOSEC) 20 MG capsule TAKE 1 CAPSULE BY MOUTH 2 TIMES DAILY. (Patient taking differently: TAKE 1 CAPSULE BY MOUTH ONCE DAILY (MAY TAKE 2ND TAB IF NEEDED)) 180 capsule 1  . PARoxetine (PAXIL) 10 MG tablet Take 1 tablet (10 mg total) by mouth daily. 30 tablet 3  . potassium chloride (K-DUR) 10 MEQ tablet TAKE 1 TABLET BY MOUTH 2 TIMES DAILY. 180 tablet 1  . traMADol (ULTRAM) 50 MG tablet Take by mouth every 8 (eight) hours as needed. Reported on 10/28/2015     No current facility-administered medications on file prior to visit.     Review of Systems Review of Systems  Constitutional: Negative for fever, appetite change,  and unexpected weight change.  Eyes: Negative for pain and visual disturbance.  Respiratory: Negative for cough and shortness of breath.   Cardiovascular: Negative for cp or palpitations    Gastrointestinal: Negative for nausea, diarrhea and constipation.  Genitourinary: Negative for urgency and frequency.  Skin: Negative for pallor or rash   Neurological: Negative for weakness, light-headedness, numbness and headaches.  MSK pos for chronic back pain  Hematological: Negative for adenopathy. Does not bruise/bleed easily.  Psychiatric/Behavioral:  pos for dysphoric mood and anxiety that is slightly improved       Objective:   Physical Exam  Constitutional: She appears well-developed and well-nourished. No distress.  Frail appearing elderly female in no distress  HENT:  Head: Normocephalic and atraumatic.  Right Ear: External ear normal.  Left Ear: External ear normal.  Mouth/Throat: Oropharynx is clear and moist.  Nares are boggy  Eyes: Conjunctivae and EOM are normal. Pupils are equal, round, and reactive to light. No scleral icterus.  Neck: Normal range of motion. Neck supple. No JVD present. Carotid bruit is not present. No thyromegaly present.  Cardiovascular: Normal rate, regular rhythm, normal heart sounds and intact distal pulses.  Exam reveals no gallop.   Pulmonary/Chest: Effort normal and breath sounds normal. No respiratory distress. She has no wheezes. She exhibits no tenderness.  Abdominal: Soft. Bowel sounds are normal. She exhibits no distension, no abdominal bruit and no mass. There is no tenderness.  Genitourinary: No breast swelling, tenderness, discharge or bleeding.  Breast exam: No mass, nodules, thickening, tenderness, bulging, retraction, inflamation, nipple discharge or skin changes noted.  No axillary or clavicular LA.      Musculoskeletal: Normal range of motion. She exhibits no edema or tenderness.  Moderate to severe thoracic kyphosis Some joint changes of OA  Lymphadenopathy:    She has no cervical adenopathy.  Neurological: She is alert. She has normal reflexes. No cranial nerve deficit. She exhibits normal muscle tone. Coordination normal.  Skin: Skin is warm and dry. No rash noted. No erythema. No pallor.  Solar lentigines and aging  Psychiatric: She has a normal mood and affect.  Mood is improved today          Assessment & Plan:   Problem List Items Addressed This Visit  Cardiovascular and Mediastinum   Essential hypertension - Primary    bp in fair control at this time  BP  Readings from Last 1 Encounters:  11/10/15 134/70   No changes needed Disc lifstyle change with low sodium diet and exercise  Labs today        Musculoskeletal and Integument   Thoracic kyphosis    History of compression fractures and also traumatic ones incl sternum and T8 and T12 Now healed  Some chronic pain Declines tx for osteopenia Declines dexa this year       Disorder of bone and cartilage    Osteopenia with hx of traumatic fractures She declines another dexa currently (last one in 2014) No falls  Rev ca and /d D level today  Disc need for calcium/ vitamin D/ wt bearing exercise and bone density test every 2 y to monitor Disc safety/ fracture risk in detail        Relevant Orders   VITAMIN D 25 Hydroxy (Vit-D Deficiency, Fractures)     Other   Routine general medical examination at a health care facility    Reviewed health habits including diet and exercise and skin cancer prevention Reviewed appropriate screening tests for age  Also reviewed health mt list, fam hx and immunization status , as well as social and family history   See HPI Will schedule AMW visit with [employer] today We will set you up to do a cologuard kit for colon cancer screening I refilled your cream  We will schedule you for a medicare interview visit with Katha Cabal sometime this summer  Declines dexa or zoster vaccine  cologuard ordered  Stick with the paxil and if you don't have improvement in mood or sleepiness in 2 more weeks please let me know Eat your fruits and veggies for potassium  Also - stay active  Labs today      Relevant Orders   CBC with Differential/Platelet   Comprehensive metabolic panel   TSH   REACTION, ACUTE STRESS W/EMOTIONAL DSTURB    So far 10 mg of paxil is making her sleepy- but beginning to help mood slightly  She has a more positive outlook today Reviewed stressors/ coping techniques/symptoms/ support sources/ tx options and side effects in detail  today In 2 weeks if still do sedating -will adj or change tx       Hyperlipidemia    Pt declines further testing Severely high cholesterol- and intolerant of statins Diet is fair/ rev low sat fat diet  At her age would rather not tx or measure it

## 2015-11-10 NOTE — Assessment & Plan Note (Signed)
Reviewed health habits including diet and exercise and skin cancer prevention Reviewed appropriate screening tests for age  Also reviewed health mt list, fam hx and immunization status , as well as social and family history   See HPI Will schedule AMW visit with [employer] today We will set you up to do a cologuard kit for colon cancer screening I refilled your cream  We will schedule you for a medicare interview visit with Katha Cabal sometime this summer  Declines dexa or zoster vaccine  cologuard ordered  Stick with the paxil and if you don't have improvement in mood or sleepiness in 2 more weeks please let me know Eat your fruits and veggies for potassium  Also - stay active  Labs today

## 2015-11-11 DIAGNOSIS — I10 Essential (primary) hypertension: Secondary | ICD-10-CM | POA: Diagnosis not present

## 2015-11-11 DIAGNOSIS — M899 Disorder of bone, unspecified: Secondary | ICD-10-CM | POA: Diagnosis not present

## 2015-11-11 DIAGNOSIS — Z0000 Encounter for general adult medical examination without abnormal findings: Secondary | ICD-10-CM | POA: Diagnosis not present

## 2015-11-11 DIAGNOSIS — Z23 Encounter for immunization: Secondary | ICD-10-CM | POA: Diagnosis not present

## 2015-11-11 DIAGNOSIS — E785 Hyperlipidemia, unspecified: Secondary | ICD-10-CM | POA: Diagnosis not present

## 2015-11-11 LAB — COMPREHENSIVE METABOLIC PANEL
ALBUMIN: 4.2 g/dL (ref 3.5–5.2)
ALK PHOS: 51 U/L (ref 39–117)
ALT: 14 U/L (ref 0–35)
AST: 18 U/L (ref 0–37)
BUN: 18 mg/dL (ref 6–23)
CO2: 30 mEq/L (ref 19–32)
Calcium: 9.8 mg/dL (ref 8.4–10.5)
Chloride: 91 mEq/L — ABNORMAL LOW (ref 96–112)
Creatinine, Ser: 0.95 mg/dL (ref 0.40–1.20)
GFR: 59.76 mL/min — AB (ref >60.00)
Glucose, Bld: 92 mg/dL (ref 70–99)
POTASSIUM: 4.2 meq/L (ref 3.5–5.1)
SODIUM: 130 meq/L — AB (ref 135–145)
TOTAL PROTEIN: 6.9 g/dL (ref 6.0–8.3)
Total Bilirubin: 0.6 mg/dL (ref 0.2–1.2)

## 2015-11-11 LAB — CBC WITH DIFFERENTIAL/PLATELET
Basophils Absolute: 0 10*3/uL (ref 0.0–0.1)
Basophils Relative: 0.2 % (ref 0.0–3.0)
EOS PCT: 3.3 % (ref 0.0–5.0)
Eosinophils Absolute: 0.3 10*3/uL (ref 0.0–0.7)
HEMATOCRIT: 42 % (ref 36.0–46.0)
HEMOGLOBIN: 13.9 g/dL (ref 12.0–15.0)
LYMPHS PCT: 23.2 % (ref 12.0–46.0)
Lymphs Abs: 2.4 10*3/uL (ref 0.7–4.0)
MCHC: 33 g/dL (ref 30.0–36.0)
MCV: 92.2 fl (ref 78.0–100.0)
MONOS PCT: 8.3 % (ref 3.0–12.0)
Monocytes Absolute: 0.9 10*3/uL (ref 0.1–1.0)
Neutro Abs: 6.6 10*3/uL (ref 1.4–7.7)
Neutrophils Relative %: 65 % (ref 43.0–77.0)
Platelets: 337 10*3/uL (ref 150.0–400.0)
RBC: 4.56 Mil/uL (ref 3.87–5.11)
RDW: 14.1 % (ref 11.5–15.5)
WBC: 10.2 10*3/uL (ref 4.0–10.5)

## 2015-11-11 LAB — VITAMIN D 25 HYDROXY (VIT D DEFICIENCY, FRACTURES): VITD: 42.57 ng/mL (ref 30.00–100.00)

## 2015-11-11 LAB — TSH: TSH: 1.13 u[IU]/mL (ref 0.35–4.50)

## 2015-11-11 NOTE — Addendum Note (Signed)
Addended by: Shon MilletWATLINGTON, Sufyaan Palma M on: 11/11/2015 08:21 AM   Modules accepted: Orders

## 2015-11-12 ENCOUNTER — Encounter: Payer: Self-pay | Admitting: *Deleted

## 2015-11-18 ENCOUNTER — Telehealth: Payer: Self-pay

## 2015-11-18 NOTE — Telephone Encounter (Signed)
Warm compresses as needed  Should improve in the next week or so - if not or if it worsens alert us  If fever or other symptoms - alert us

## 2015-11-18 NOTE — Telephone Encounter (Signed)
Pt left v/m; pt received prevnar 13 on 11/11/15; soreness around injection site since received inj and today there is redness around the injection site. Pt request cb with what to do.

## 2015-11-18 NOTE — Telephone Encounter (Signed)
Pt notified of Dr. Tower's comments and verbalized understanding  

## 2015-11-25 DIAGNOSIS — Z1212 Encounter for screening for malignant neoplasm of rectum: Secondary | ICD-10-CM | POA: Diagnosis not present

## 2015-11-25 DIAGNOSIS — Z1211 Encounter for screening for malignant neoplasm of colon: Secondary | ICD-10-CM | POA: Diagnosis not present

## 2015-11-25 LAB — COLOGUARD: COLOGUARD: NEGATIVE

## 2015-12-26 DIAGNOSIS — S91109A Unspecified open wound of unspecified toe(s) without damage to nail, initial encounter: Secondary | ICD-10-CM | POA: Diagnosis not present

## 2015-12-26 DIAGNOSIS — L03031 Cellulitis of right toe: Secondary | ICD-10-CM | POA: Diagnosis not present

## 2016-01-01 ENCOUNTER — Emergency Department (HOSPITAL_COMMUNITY): Payer: PPO

## 2016-01-01 ENCOUNTER — Inpatient Hospital Stay (HOSPITAL_COMMUNITY)
Admission: EM | Admit: 2016-01-01 | Discharge: 2016-01-03 | DRG: 689 | Disposition: A | Discharge status: Home Health | Payer: PPO | Attending: Family Medicine | Admitting: Family Medicine

## 2016-01-01 ENCOUNTER — Encounter (HOSPITAL_COMMUNITY): Payer: Self-pay | Admitting: Nurse Practitioner

## 2016-01-01 DIAGNOSIS — Z825 Family history of asthma and other chronic lower respiratory diseases: Secondary | ICD-10-CM

## 2016-01-01 DIAGNOSIS — F0390 Unspecified dementia without behavioral disturbance: Secondary | ICD-10-CM | POA: Diagnosis not present

## 2016-01-01 DIAGNOSIS — G9340 Encephalopathy, unspecified: Secondary | ICD-10-CM | POA: Diagnosis not present

## 2016-01-01 DIAGNOSIS — E871 Hypo-osmolality and hyponatremia: Secondary | ICD-10-CM | POA: Diagnosis not present

## 2016-01-01 DIAGNOSIS — W19XXXA Unspecified fall, initial encounter: Secondary | ICD-10-CM | POA: Diagnosis present

## 2016-01-01 DIAGNOSIS — G92 Toxic encephalopathy: Secondary | ICD-10-CM | POA: Diagnosis present

## 2016-01-01 DIAGNOSIS — E785 Hyperlipidemia, unspecified: Secondary | ICD-10-CM | POA: Diagnosis present

## 2016-01-01 DIAGNOSIS — Z882 Allergy status to sulfonamides status: Secondary | ICD-10-CM | POA: Diagnosis not present

## 2016-01-01 DIAGNOSIS — Y92009 Unspecified place in unspecified non-institutional (private) residence as the place of occurrence of the external cause: Secondary | ICD-10-CM

## 2016-01-01 DIAGNOSIS — Z885 Allergy status to narcotic agent status: Secondary | ICD-10-CM

## 2016-01-01 DIAGNOSIS — M8580 Other specified disorders of bone density and structure, unspecified site: Secondary | ICD-10-CM | POA: Diagnosis not present

## 2016-01-01 DIAGNOSIS — E876 Hypokalemia: Secondary | ICD-10-CM | POA: Diagnosis not present

## 2016-01-01 DIAGNOSIS — F329 Major depressive disorder, single episode, unspecified: Secondary | ICD-10-CM | POA: Diagnosis present

## 2016-01-01 DIAGNOSIS — Z833 Family history of diabetes mellitus: Secondary | ICD-10-CM | POA: Diagnosis not present

## 2016-01-01 DIAGNOSIS — E860 Dehydration: Secondary | ICD-10-CM | POA: Diagnosis present

## 2016-01-01 DIAGNOSIS — Z887 Allergy status to serum and vaccine status: Secondary | ICD-10-CM

## 2016-01-01 DIAGNOSIS — K219 Gastro-esophageal reflux disease without esophagitis: Secondary | ICD-10-CM | POA: Diagnosis present

## 2016-01-01 DIAGNOSIS — M25552 Pain in left hip: Secondary | ICD-10-CM | POA: Diagnosis not present

## 2016-01-01 DIAGNOSIS — Z79899 Other long term (current) drug therapy: Secondary | ICD-10-CM

## 2016-01-01 DIAGNOSIS — Z91040 Latex allergy status: Secondary | ICD-10-CM | POA: Diagnosis not present

## 2016-01-01 DIAGNOSIS — N390 Urinary tract infection, site not specified: Secondary | ICD-10-CM | POA: Diagnosis not present

## 2016-01-01 DIAGNOSIS — T148XXA Other injury of unspecified body region, initial encounter: Secondary | ICD-10-CM

## 2016-01-01 DIAGNOSIS — S7002XA Contusion of left hip, initial encounter: Secondary | ICD-10-CM | POA: Diagnosis not present

## 2016-01-01 DIAGNOSIS — R404 Transient alteration of awareness: Secondary | ICD-10-CM | POA: Diagnosis not present

## 2016-01-01 DIAGNOSIS — I10 Essential (primary) hypertension: Secondary | ICD-10-CM | POA: Diagnosis not present

## 2016-01-01 DIAGNOSIS — Z880 Allergy status to penicillin: Secondary | ICD-10-CM | POA: Diagnosis not present

## 2016-01-01 DIAGNOSIS — Z8249 Family history of ischemic heart disease and other diseases of the circulatory system: Secondary | ICD-10-CM

## 2016-01-01 DIAGNOSIS — R4182 Altered mental status, unspecified: Secondary | ICD-10-CM | POA: Diagnosis not present

## 2016-01-01 DIAGNOSIS — W19XXXD Unspecified fall, subsequent encounter: Secondary | ICD-10-CM | POA: Diagnosis not present

## 2016-01-01 DIAGNOSIS — Z888 Allergy status to other drugs, medicaments and biological substances status: Secondary | ICD-10-CM | POA: Diagnosis not present

## 2016-01-01 DIAGNOSIS — S79912A Unspecified injury of left hip, initial encounter: Secondary | ICD-10-CM | POA: Diagnosis not present

## 2016-01-01 DIAGNOSIS — S199XXA Unspecified injury of neck, initial encounter: Secondary | ICD-10-CM | POA: Diagnosis not present

## 2016-01-01 DIAGNOSIS — S0990XA Unspecified injury of head, initial encounter: Secondary | ICD-10-CM | POA: Diagnosis not present

## 2016-01-01 DIAGNOSIS — W1830XA Fall on same level, unspecified, initial encounter: Secondary | ICD-10-CM | POA: Diagnosis present

## 2016-01-01 DIAGNOSIS — R531 Weakness: Secondary | ICD-10-CM | POA: Diagnosis not present

## 2016-01-01 LAB — CBC WITH DIFFERENTIAL/PLATELET
BASOS PCT: 0 %
Basophils Absolute: 0 10*3/uL (ref 0.0–0.1)
EOS ABS: 0.2 10*3/uL (ref 0.0–0.7)
EOS PCT: 2 %
HEMATOCRIT: 41 % (ref 36.0–46.0)
HEMOGLOBIN: 14.5 g/dL (ref 12.0–15.0)
LYMPHS ABS: 3 10*3/uL (ref 0.7–4.0)
Lymphocytes Relative: 30 %
MCH: 31 pg (ref 26.0–34.0)
MCHC: 35.4 g/dL (ref 30.0–36.0)
MCV: 87.8 fL (ref 78.0–100.0)
MONOS PCT: 9 %
Monocytes Absolute: 0.9 10*3/uL (ref 0.1–1.0)
Neutro Abs: 5.9 10*3/uL (ref 1.7–7.7)
Neutrophils Relative %: 59 %
Platelets: 307 10*3/uL (ref 150–400)
RBC: 4.67 MIL/uL (ref 3.87–5.11)
RDW: 12.9 % (ref 11.5–15.5)
WBC: 10 10*3/uL (ref 4.0–10.5)

## 2016-01-01 LAB — URINALYSIS, ROUTINE W REFLEX MICROSCOPIC
Bilirubin Urine: NEGATIVE
GLUCOSE, UA: NEGATIVE mg/dL
Ketones, ur: NEGATIVE mg/dL
Nitrite: NEGATIVE
PROTEIN: NEGATIVE mg/dL
SPECIFIC GRAVITY, URINE: 1.004 — AB (ref 1.005–1.030)
pH: 7 (ref 5.0–8.0)

## 2016-01-01 LAB — COMPREHENSIVE METABOLIC PANEL
ALBUMIN: 4.4 g/dL (ref 3.5–5.0)
ALT: 19 U/L (ref 14–54)
ANION GAP: 10 (ref 5–15)
AST: 35 U/L (ref 15–41)
Alkaline Phosphatase: 60 U/L (ref 38–126)
BILIRUBIN TOTAL: 1 mg/dL (ref 0.3–1.2)
BUN: 19 mg/dL (ref 6–20)
CHLORIDE: 88 mmol/L — AB (ref 101–111)
CO2: 31 mmol/L (ref 22–32)
Calcium: 9.4 mg/dL (ref 8.9–10.3)
Creatinine, Ser: 0.92 mg/dL (ref 0.44–1.00)
GFR calc Af Amer: >60 mL/min (ref >60)
GFR calc non Af Amer: 56 mL/min — ABNORMAL LOW (ref >60)
GLUCOSE: 98 mg/dL (ref 65–99)
POTASSIUM: 2.8 mmol/L — AB (ref 3.5–5.1)
Sodium: 129 mmol/L — ABNORMAL LOW (ref 135–145)
TOTAL PROTEIN: 7.2 g/dL (ref 6.5–8.1)

## 2016-01-01 LAB — I-STAT TROPONIN, ED: TROPONIN I, POC: 0.02 ng/mL (ref 0.00–0.08)

## 2016-01-01 LAB — URINE MICROSCOPIC-ADD ON

## 2016-01-01 LAB — CK: CK TOTAL: 696 U/L — AB (ref 38–234)

## 2016-01-01 MED ORDER — ACETAMINOPHEN 650 MG RE SUPP: 650.0000 mg | Freq: Four times a day (QID) | RECTAL | Status: DC | PRN

## 2016-01-01 MED ORDER — ONDANSETRON HCL 4 MG PO TABS: 4.0000 mg | ORAL_TABLET | Freq: Four times a day (QID) | ORAL | Status: DC | PRN

## 2016-01-01 MED ORDER — CIPROFLOXACIN IN D5W 400 MG/200ML IV SOLN
400.0000 mg | Freq: Once | INTRAVENOUS | Status: AC
  Administered 2016-01-01: 400 mg via INTRAVENOUS
  Filled 2016-01-01: qty 200

## 2016-01-01 MED ORDER — AZELASTINE HCL 0.1 % NA SOLN
2.0000 | Freq: Two times a day (BID) | NASAL | Status: DC
Start: 2016-01-01 — End: 2016-01-03
  Administered 2016-01-01 – 2016-01-03 (×4): 2 via NASAL
  Filled 2016-01-01: qty 30

## 2016-01-01 MED ORDER — POTASSIUM CHLORIDE 10 MEQ/100ML IV SOLN
INTRAVENOUS | Status: AC
  Filled 2016-01-01: qty 100

## 2016-01-01 MED ORDER — PANTOPRAZOLE SODIUM 40 MG PO TBEC
40.0000 mg | DELAYED_RELEASE_TABLET | Freq: Every day | ORAL | Status: DC
  Administered 2016-01-02 – 2016-01-03 (×2): 40 mg via ORAL
  Filled 2016-01-01 (×2): qty 1

## 2016-01-01 MED ORDER — PAROXETINE HCL 10 MG PO TABS
10.0000 mg | ORAL_TABLET | Freq: Every day | ORAL | Status: DC
  Administered 2016-01-02 – 2016-01-03 (×2): 10 mg via ORAL
  Filled 2016-01-01 (×2): qty 1

## 2016-01-01 MED ORDER — SODIUM CHLORIDE 0.9% FLUSH
3.0000 mL | Freq: Two times a day (BID) | INTRAVENOUS | Status: DC
  Administered 2016-01-01: 3 mL via INTRAVENOUS

## 2016-01-01 MED ORDER — POLYETHYLENE GLYCOL 3350 17 G PO PACK: 17.0000 g | PACK | Freq: Every day | ORAL | Status: DC | PRN

## 2016-01-01 MED ORDER — ONDANSETRON HCL 4 MG/2ML IJ SOLN: 4.0000 mg | Freq: Four times a day (QID) | INTRAMUSCULAR | Status: DC | PRN

## 2016-01-01 MED ORDER — ACETAMINOPHEN 325 MG PO TABS: 650.0000 mg | ORAL_TABLET | Freq: Four times a day (QID) | ORAL | Status: DC | PRN

## 2016-01-01 MED ORDER — IBUPROFEN 200 MG PO TABS: 400.0000 mg | ORAL_TABLET | Freq: Four times a day (QID) | ORAL | Status: DC | PRN

## 2016-01-01 MED ORDER — SODIUM CHLORIDE 0.9 % IV SOLN
Freq: Once | INTRAVENOUS | Status: AC
  Administered 2016-01-01: 21:00:00 via INTRAVENOUS

## 2016-01-01 MED ORDER — POTASSIUM CHLORIDE ER 10 MEQ PO TBCR
20.0000 meq | EXTENDED_RELEASE_TABLET | Freq: Two times a day (BID) | ORAL | Status: DC
  Administered 2016-01-01 – 2016-01-03 (×4): 20 meq via ORAL
  Filled 2016-01-01 (×7): qty 2

## 2016-01-01 MED ORDER — VITAMIN D 1000 UNITS PO TABS
2000.0000 [IU] | ORAL_TABLET | Freq: Two times a day (BID) | ORAL | Status: DC
  Administered 2016-01-01 – 2016-01-03 (×4): 2000 [IU] via ORAL
  Filled 2016-01-01 (×4): qty 2

## 2016-01-01 MED ORDER — POTASSIUM CHLORIDE IN NACL 40-0.9 MEQ/L-% IV SOLN
INTRAVENOUS | Status: DC
  Filled 2016-01-01: qty 1000

## 2016-01-01 MED ORDER — LORATADINE 10 MG PO TABS
10.0000 mg | ORAL_TABLET | Freq: Every day | ORAL | Status: DC
  Administered 2016-01-02 – 2016-01-03 (×2): 10 mg via ORAL
  Filled 2016-01-01 (×2): qty 1

## 2016-01-01 MED ORDER — POTASSIUM CHLORIDE 10 MEQ/100ML IV SOLN
10.0000 meq | INTRAVENOUS | Status: DC
Start: 2016-01-01 — End: 2016-01-02
  Administered 2016-01-01 (×2): 10 meq via INTRAVENOUS
  Filled 2016-01-01: qty 100

## 2016-01-01 MED ORDER — ENOXAPARIN SODIUM 40 MG/0.4ML ~~LOC~~ SOLN
40.0000 mg | Freq: Every day | SUBCUTANEOUS | Status: DC
  Administered 2016-01-01 – 2016-01-02 (×2): 40 mg via SUBCUTANEOUS
  Filled 2016-01-01 (×2): qty 0.4

## 2016-01-01 MED ORDER — SODIUM CHLORIDE 0.9 % IV SOLN
INTRAVENOUS | Status: AC
  Administered 2016-01-01: 23:00:00 via INTRAVENOUS

## 2016-01-01 MED ORDER — BISACODYL 5 MG PO TBEC: 5.0000 mg | DELAYED_RELEASE_TABLET | Freq: Every day | ORAL | Status: DC | PRN

## 2016-01-01 NOTE — ED Notes (Signed)
Unable to collect labs PT not in rm 

## 2016-01-01 NOTE — Progress Notes (Signed)
Pharmacy Antibiotic Note  Gerri Drinnon is a 82 y.o. female admitted on 01/01/2016 with UTI.  Pharmacy has been consulted for cipro dosing.  Plan: Cipro 400 mg IV q12h  Height: 5\' 2"  (157.5 cm) Weight: 126 lb 8.7 oz (57.4 kg) IBW/kg (Calculated) : 50.1  Temp (24hrs), Avg:97.8 F (36.6 C), Min:97.7 F (36.5 C), Max:97.8 F (36.6 C)   Recent Labs Lab 01/01/16 1937  WBC 10.0  CREATININE 0.92    Estimated Creatinine Clearance: 37.3 mL/min (by C-G formula based on SCr of 0.92 mg/dL).    Allergies  Allergen Reactions  . Tdap [Diphth-Acell Pertussis-Tetanus] Swelling  . Amlodipine Besylate     REACTION: edema  . Codeine     headache  . Hydrocodone     hallucinations  . Lovastatin     REACTION: leg pain  . Metronidazole Swelling    And body aches  . Penicillins     Unknown reaction  . Sertraline Hcl     REACTION: caused insomnia  . Simvastatin     REACTION: non tolerant- leg pain  . Latex Rash  . Sulfonamide Derivatives Rash    Antimicrobials this admission: 8/3 cipro >>    >>   Dose adjustments this admission:   Microbiology results:  BCx:   UCx:    Sputum:    MRSA PCR:   Thank you for allowing pharmacy to be a part of this patient's care.  Lorenza Evangelist 01/01/2016 10:47 PM

## 2016-01-01 NOTE — ED Provider Notes (Signed)
[facility]-EMERGENCY DEPT Provider Note   CSN: [id number] Arrival date & time: 01/01/16  1708  First Provider Contact:  First MD Initiated Contact with Patient 01/01/16 17:45      History   Chief Complaint Chief Complaint  Patient presents with  . Fall  . Altered Mental Status    HPI Amber Rollins is a 82 y.o. female. She lives alone. She is accompanied only by her son and daughter. She remembers having a fall last night initially. Then has trouble delivering what may have happened last night. She takes for recurrent minders by her son. Apparently this is unusual. She spent the night on the floor after falling. She had no headache. She has a bruise on her hip. She felt weak. Son states that that helped her bed and she seemed okay. However some other visitors and putting family and friends came by later in the day and she was "very confused". She was transferred here.  She is not on a blood to her. She's not been ill at home to the family's knowledge with fever nausea vomiting or any urinary complaints. HPI  Past Medical History:  Diagnosis Date  . Chronic foot pain    mortons neuroma  . Complication of anesthesia   . Cystocele 01/01   neg. sx  . fracture fibula ? 2003   left  . GERD (gastroesophageal reflux disease)   . Hemorrhoids   . Hyperlipemia 05/31/1992  . Hypertension 05/31/2000  . Menopausal symptoms    vasomotor symptoms-severe  . Osteoarthritis   . Osteopenia 11/29/1999  . Pedal edema    worsened by Norvasc  . Plantar fasciitis   . PONV (postoperative nausea and vomiting)     Patient Active Problem List   Diagnosis Date Noted  . Routine general medical examination at a health care facility 11/10/2015  . Cellulitis 10/28/2015  . Urinary frequency 10/23/2014  . Fatigue 10/23/2014  . Hammer toe 09/20/2014  . Herpetic dermatitis 08/09/2014  . Left flank pain 07/01/2014  . Candidal intertrigo 02/11/2014  . Dizziness 04/11/2013  . Thoracic back pain  04/11/2013  . Urinary incontinence 12/27/2012  . Depression 09/24/2012  . Thoracic kyphosis 09/24/2012  . Acute blood loss anemia 08/10/2012  . Fall 08/09/2012  . Laceration of lower leg 08/09/2012  . Hemorrhoid 12/24/2011  . Dysuria 12/24/2011  . Low back pain 12/24/2011  . Hypokalemia 10/06/2010  . HEMATURIA UNSPECIFIED 10/02/2009  . NIGHT SWEATS 10/02/2009  . GERD 06/18/2009  . INCONTINENCE, URGE 06/18/2009  . FATIGUE 03/12/2009  . HYPOKALEMIA 10/08/2008  . MENOPAUSAL SYNDROME 01/22/2008  . EDEMA 07/19/2007  . ANXIETY STATE, UNSPECIFIED 06/21/2007  . REACTION, ACUTE STRESS W/EMOTIONAL DSTURB 03/06/2007  . OVARIAN MASS 03/06/2007  . FOOT PAIN, CHRONIC 03/06/2007  . ALLERGIC RHINITIS, SEASONAL 09/12/2006  . IRRITABLE BOWEL SYNDROME 09/12/2006  . FIBROCYSTIC BREAST DISEASE 09/12/2006  . OSTEOARTHRITIS 09/12/2006  . HEMORRHOIDS, HX OF 09/12/2006  . HYSTERECTOMY, HX OF 09/12/2006  . Essential hypertension 05/31/2000  . Disorder of bone and cartilage 11/29/1999  . Hyperlipidemia 05/31/1992    Past Surgical History:  Procedure Laterality Date  . Anterior repari w//surg proc  06/30/99   RSO  . BLADDER SUSPENSION    . BLADDER TACK    . CATARACTS    . Foot problems     neuromas  . history of CT     mass right adnexa, U?S by GYN-observation  . KYPHOPLASTY N/A 09/04/2012   Procedure: T7, T8,T12 Kyphoplasty ;  Surgeon: Dola Argyle  Phoebe Perch, MD;  Location: [facility];  Service: Neurosurgery;  Laterality: N/A;  thoracic seven,eight, and twelve   . PARTIAL HYSTERECTOMY  1970   endometriosis    OB History    No data available       Home Medications    Prior to Admission medications   Medication Sig Start Date End Date Taking? Authorizing Provider  azelastine (ASTELIN) 0.1 % nasal spray PLACE 2 SPRAYS INTO BOTH NOSTRILS 2 TIMES DAILY. 02/21/15  Yes [name], MD  Cholecalciferol (VITAMIN D3) 2000 UNITS TABS Take 1 tablet by mouth 2 (two) times daily.   Yes Historical  Provider, MD  doxycycline (VIBRAMYCIN) 100 MG capsule Take 100 mg by mouth 2 (two) times daily.   Yes Historical Provider, MD  hydrochlorothiazide (HYDRODIURIL) 25 MG tablet TAKE 2 TABLETS BY MOUTH EVERY MORNING. 06/24/15  Yes [name], MD  omeprazole (PRILOSEC) 20 MG capsule TAKE 1 CAPSULE BY MOUTH 2 TIMES DAILY. Patient taking differently: TAKE 1 CAPSULE BY MOUTH ONCE DAILY 01/29/15  Yes [name], MD  PARoxetine (PAXIL) 10 MG tablet Take 1 tablet (10 mg total) by mouth daily. 10/28/15  Yes [name], MD  potassium chloride (K-DUR) 10 MEQ tablet TAKE 1 TABLET BY MOUTH 2 TIMES DAILY. 08/22/15  Yes [name], MD  promethazine (PHENERGAN) 25 MG tablet Take 25 mg by mouth every 6 (six) hours as needed for nausea or vomiting.   Yes Historical Provider, MD  fexofenadine (ALLEGRA) 180 MG tablet Take 180 mg by mouth daily as needed (allergies).    Historical Provider, MD  fluticasone (FLONASE) 50 MCG/ACT nasal spray PLACE 1 TO 2 SPRAYS IN EACH NOSTRIL DAILY. Patient not taking: Reported on 01/01/2016 09/08/15   [name], MD  mometasone (ELOCON) 0.1 % cream Apply to affected area in small amount 3 times weekly as needed 11/10/15   [name], MD    Family History Family History  Problem Relation Age of Onset  . Diabetes Brother   . Hypertension Other   . Diabetes Mother   . Heart disease Mother     pacemaker  . Hypertension Mother   . COPD Father   . Diabetes Sister   . Mental illness Son     anxiety-mental health problems  . Diabetes Brother   . Diabetes Sister   . Diabetes Sister     Social History Social History  Substance Use Topics  . Smoking status: Never Smoker  . Smokeless tobacco: Never Used  . Alcohol use No     Allergies   Tdap [diphth-acell pertussis-tetanus]; Amlodipine besylate; Codeine; Hydrocodone; Lovastatin; Metronidazole; Penicillins; Sertraline hcl; Simvastatin; Latex; and Sulfonamide derivatives   Review of Systems Review of Systems    Constitutional: Negative for appetite change, chills, diaphoresis, fatigue and fever.  HENT: Negative for mouth sores, sore throat and trouble swallowing.   Eyes: Negative for visual disturbance.  Respiratory: Negative for cough, chest tightness, shortness of breath and wheezing.   Cardiovascular: Negative for chest pain.  Gastrointestinal: Negative for abdominal distention, abdominal pain, diarrhea, nausea and vomiting.  Endocrine: Negative for polydipsia, polyphagia and polyuria.  Genitourinary: Negative for dysuria, hematuria and urgency.  Musculoskeletal: Negative for gait problem.       Bruising to the left hip and left elbow.  Skin: Negative for color change, pallor and rash.  Neurological: Negative for dizziness, syncope, light-headedness and headaches.  Hematological: Does not bruise/bleed easily.  Psychiatric/Behavioral: Positive for confusion. Negative for behavioral problems.  Physical Exam Updated Vital Signs BP 153/93 (BP Location: Left Arm)   Pulse 66   Temp 97.7 F (36.5 C) (Oral)   Resp 20   SpO2 96%   Physical Exam  Constitutional: She is oriented to person, place, and time. She appears well-developed and well-nourished. No distress.  HENT:  Head: Normocephalic.  No obvious signs of trauma to the head. No blood over the TMs, mastoids, or from ears nose or mouth. No facial trauma or contusion. Nontender over the posterior scalp and calvarium. No midline neck tenderness.  Eyes: Conjunctivae are normal. Pupils are equal, round, and reactive to light. No scleral icterus.  Neck: Normal range of motion. Neck supple. No thyromegaly present.  Cardiovascular: Normal rate and regular rhythm.  Exam reveals no gallop and no friction rub.   No murmur heard. Pulmonary/Chest: Effort normal and breath sounds normal. No respiratory distress. She has no wheezes. She has no rales.  Abdominal: Soft. Bowel sounds are normal. She exhibits no distension. There is no tenderness.  There is no rebound.  Musculoskeletal: Normal range of motion.  Range of motion of the left elbow and left hip. Ecchymosis overlying the greater trochanter left hip overlying the olecranon of the left elbow.  Neurological: She is alert and oriented to person, place, and time.  Skin: Skin is warm and dry. No rash noted.  Psychiatric: She has a normal mood and affect. Her behavior is normal.     ED Treatments / Results  Labs (all labs ordered are listed, but only abnormal results are displayed) Labs Reviewed  COMPREHENSIVE METABOLIC PANEL - Abnormal; Notable for the following:       Result Value   Sodium 129 (*)    Potassium 2.8 (*)    Chloride 88 (*)    GFR calc non Af Amer 56 (*)    All other components within normal limits  URINALYSIS, ROUTINE W REFLEX MICROSCOPIC (NOT AT [facility]) - Abnormal; Notable for the following:    Specific Gravity, Urine 1.004 (*)    Hgb urine dipstick SMALL (*)    Leukocytes, UA MODERATE (*)    All other components within normal limits  CK - Abnormal; Notable for the following:    Total CK 696 (*)    All other components within normal limits  URINE MICROSCOPIC-ADD ON - Abnormal; Notable for the following:    Squamous Epithelial / LPF 0-5 (*)    Bacteria, UA FEW (*)    All other components within normal limits  URINE CULTURE  CBC WITH DIFFERENTIAL/PLATELET  Rosezena Sensor, ED    EKG  EKG Interpretation  Date/Time:  Thursday January 01 2016 17:43:42 EDT Ventricular Rate:  70 PR Interval:    QRS Duration: 85 QT Interval:  422 QTC Calculation: 456 R Axis:   1 Text Interpretation:  Sinus rhythm Abnormal R-wave progression, early transition Minimal ST depression, inferior leads Confirmed by Fayrene Fearing  MD, Ski Polich ([id number]) on 01/01/2016 8:24:23 PM       Radiology Ct Head Wo Contrast  Result Date: 01/01/2016 CLINICAL DATA:  82 year old female with altered mental status, reportedly fell last night. Initial encounter. EXAM: CT HEAD WITHOUT CONTRAST CT  CERVICAL SPINE WITHOUT CONTRAST TECHNIQUE: Multidetector CT imaging of the head and cervical spine was performed following the standard protocol without intravenous contrast. Multiplanar CT image reconstructions of the cervical spine were also generated. COMPARISON:  Cervical spine CT 08/08/2012 FINDINGS: CT HEAD FINDINGS Visualized paranasal sinuses and mastoids are well pneumatized. No scalp hematoma identified. Negative  orbits soft tissues. Calvarium intact. Calcified atherosclerosis at the skull base. Chronic lacunar infarct left basal ganglia. Associated patchy bilateral cerebral white matter hypodensity. No midline shift, mass effect, or evidence of intracranial mass lesion. No cortically based acute infarct identified. No acute intracranial hemorrhage identified. CT CERVICAL SPINE FINDINGS Motion artifact below the C5 level. Significant loss of bone detail at C6. Cervicothoracic junction alignment appears maintained. Visualized skull base is intact. No atlanto-occipital dissociation. No definite cervical spine fracture. Visualized upper thoracic levels appear stable. Negative noncontrast paraspinal soft tissues aside from calcified carotid artery atherosclerosis. Negative lung apices. IMPRESSION: 1. No acute intracranial abnormality. Chronic small vessel ischemia most pronounced in the left basal ganglia. 2. C6 cervical spine level degraded by motion. No fracture identified in the cervical spine. Ligamentous injury is not excluded. Electronically Signed   By: Odessa Fleming M.D.   On: 01/01/2016 19:21   Ct Cervical Spine Wo Contrast  Result Date: 01/01/2016 CLINICAL DATA:  82 year old female with altered mental status, reportedly fell last night. Initial encounter. EXAM: CT HEAD WITHOUT CONTRAST CT CERVICAL SPINE WITHOUT CONTRAST TECHNIQUE: Multidetector CT imaging of the head and cervical spine was performed following the standard protocol without intravenous contrast. Multiplanar CT image reconstructions of the  cervical spine were also generated. COMPARISON:  Cervical spine CT 08/08/2012 FINDINGS: CT HEAD FINDINGS Visualized paranasal sinuses and mastoids are well pneumatized. No scalp hematoma identified. Negative orbits soft tissues. Calvarium intact. Calcified atherosclerosis at the skull base. Chronic lacunar infarct left basal ganglia. Associated patchy bilateral cerebral white matter hypodensity. No midline shift, mass effect, or evidence of intracranial mass lesion. No cortically based acute infarct identified. No acute intracranial hemorrhage identified. CT CERVICAL SPINE FINDINGS Motion artifact below the C5 level. Significant loss of bone detail at C6. Cervicothoracic junction alignment appears maintained. Visualized skull base is intact. No atlanto-occipital dissociation. No definite cervical spine fracture. Visualized upper thoracic levels appear stable. Negative noncontrast paraspinal soft tissues aside from calcified carotid artery atherosclerosis. Negative lung apices. IMPRESSION: 1. No acute intracranial abnormality. Chronic small vessel ischemia most pronounced in the left basal ganglia. 2. C6 cervical spine level degraded by motion. No fracture identified in the cervical spine. Ligamentous injury is not excluded. Electronically Signed   By: Odessa Fleming M.D.   On: 01/01/2016 19:21   Dg Hip Unilat W Or Wo Pelvis 2-3 Views Left  Result Date: 01/01/2016 CLINICAL DATA:  Left hip pain status post fall. EXAM: DG HIP (WITH OR WITHOUT PELVIS) 2-3V LEFT COMPARISON:  None. FINDINGS: There is no evidence of hip fracture or dislocation. There is no evidence of arthropathy or other focal bone abnormality. Soft tissue bruising is seen overlying the greater trochanter. IMPRESSION: No acute fracture or dislocation identified about the left hip or pelvis. Electronically Signed   By: Ted Mcalpine M.D.   On: 01/01/2016 18:46    Procedures Procedures (including critical care time)  Medications Ordered in  ED Medications  ciprofloxacin (CIPRO) IVPB 400 mg (not administered)  potassium chloride 10 mEq in 100 mL IVPB (not administered)  0.9 %  sodium chloride infusion (not administered)     Initial Impression / Assessment and Plan / ED Course  I have reviewed the triage vital signs and the nursing notes.  Pertinent labs & imaging results that were available during my care of the patient were reviewed by me and considered in my medical decision making (see chart for details).  Clinical Course    Patient's pelvic x-ray and hip x-ray is  negative. Negative CT head. CPK only 696. Potassium low at 2.8. Sodium slightly low 129. Urine shows infection. 6-30 WBC, with few bacteria.  Patient does not appear septic or toxic. She is intermittently confused. My preference would be admission for antibiotics, potassium replacement, prior to discharge, as the patient is aren't he had one fall and spent the night on the floor. Will discuss with hospitalist.  Final Clinical Impressions(s) / ED Diagnoses   Final diagnoses:  UTI (lower urinary tract infection)  Contusion  Fall, initial encounter  Hypokalemia    Patient has allergy to penicillin, and sulfa. She does not recall her reaction to penicillin. Per review of her records she's never had a cephalosporin. Has had Cipro multiple times for UTIs in the past. Willll give IV Cipro.  New Prescriptions New Prescriptions   No medications on file     Rolland Porter, MD 01/01/16 2049

## 2016-01-01 NOTE — H&P (Signed)
History and Physical    Amber Rollins:[id number] DOB: Oct 23, 1930 DOA: 01/01/2016  PCP: Roxy Manns, MD   Patient coming from: Home   Chief Complaint: AMS, unwitnessed fall   HPI: Amber Rollins is a 82 y.o. female with medical history significant for hypertension, depression, allergic rhinitis, and GERD who presents the emergency department for evaluation of acute encephalopathy. Patient lives alone with frequent visits from local family and friends. She is said to have mild dementia, but is typically able to care for herself and hold meaningful conversations. She had an unwitnessed fall last night and had apparently spent the night on the floor. She was found by her son this morning who helped her up, noted that she was able to ambulate, and helped her into bed as she seemed tired, but with no other significant concerns. Later in the day, a neighbor checked on her and found her to be quite confused and notified family. Her family agreed that she was far from her baseline mental status and activated EMS for transport to the hospital. Patient recently had a toenail removed, reportedly due to fungal infection, and was placed on doxycycline on 12/26/2015, reportedly for possible bacterial involvement at the toe. The doxycycline made the patient nauseous and she was prescribed Phenergan.   ED Course: Upon arrival to the ED, patient is found to be afebrile, saturating well on room air, and with vital signs stable. EKG demonstrates a sinus rhythm with early R transition. Noncontrast CT of the head and cervical spine are negative for acute abnormalities. Radiographs of the left hip are negative for acute fracture or dislocation. CMP is notable for sodium of 129 and potassium of 2.8. CBC is unremarkable and troponin is 0.02. CK is mildly elevated to 696. Urinalysis features few bacteria, moderate leukocyte, negative nitrite, 6-30 WBC, small hemoglobin, and low specific gravity. Patient was given  20 mEq of IV potassium, a gentle IV fluid hydration with normal saline, and empiric ciprofloxacin was administered for suspected UTI. Urine was sent for culture. Patient remains hemodynamically stable in the emergency department and will be admitted to the telemetry unit for ongoing evaluation and management of acute encephalopathy suspected secondary to UTI, complicated by hyponatremia and hypokalemia.  Review of Systems:  All other systems reviewed and apart from HPI, are negative.  Past Medical History:  Diagnosis Date  . Chronic foot pain    mortons neuroma  . Complication of anesthesia   . Cystocele 01/01   neg. sx  . fracture fibula ? 2003   left  . GERD (gastroesophageal reflux disease)   . Hemorrhoids   . Hyperlipemia 05/31/1992  . Hypertension 05/31/2000  . Menopausal symptoms    vasomotor symptoms-severe  . Osteoarthritis   . Osteopenia 11/29/1999  . Pedal edema    worsened by Norvasc  . Plantar fasciitis   . PONV (postoperative nausea and vomiting)     Past Surgical History:  Procedure Laterality Date  . Anterior repari w//surg proc  06/30/99   RSO  . BLADDER SUSPENSION    . BLADDER TACK    . CATARACTS    . Foot problems     neuromas  . history of CT     mass right adnexa, U?S by GYN-observation  . KYPHOPLASTY N/A 09/04/2012   Procedure: T7, T8,T12 Kyphoplasty ;  Surgeon: Clydene Fake, MD;  Location: [facility];  Service: Neurosurgery;  Laterality: N/A;  thoracic seven,eight, and twelve   . PARTIAL HYSTERECTOMY  1970  endometriosis     reports that she has never smoked. She has never used smokeless tobacco. She reports that she does not drink alcohol or use drugs.  Allergies  Allergen Reactions  . Tdap [Diphth-Acell Pertussis-Tetanus] Swelling  . Amlodipine Besylate     REACTION: edema  . Codeine     headache  . Hydrocodone     hallucinations  . Lovastatin     REACTION: leg pain  . Metronidazole Swelling    And body aches  . Penicillins      Unknown reaction  . Sertraline Hcl     REACTION: caused insomnia  . Simvastatin     REACTION: non tolerant- leg pain  . Latex Rash  . Sulfonamide Derivatives Rash    Family History  Problem Relation Age of Onset  . Diabetes Brother   . Hypertension Other   . Diabetes Mother   . Heart disease Mother     pacemaker  . Hypertension Mother   . COPD Father   . Diabetes Sister   . Mental illness Son     anxiety-mental health problems  . Diabetes Brother   . Diabetes Sister   . Diabetes Sister      Prior to Admission medications   Medication Sig Start Date End Date Taking? Authorizing Provider  azelastine (ASTELIN) 0.1 % nasal spray PLACE 2 SPRAYS INTO BOTH NOSTRILS 2 TIMES DAILY. 02/21/15  Yes [name], MD  Cholecalciferol (VITAMIN D3) 2000 UNITS TABS Take 1 tablet by mouth 2 (two) times daily.   Yes Historical Provider, MD  doxycycline (VIBRAMYCIN) 100 MG capsule Take 100 mg by mouth 2 (two) times daily.   Yes Historical Provider, MD  hydrochlorothiazide (HYDRODIURIL) 25 MG tablet TAKE 2 TABLETS BY MOUTH EVERY MORNING. 06/24/15  Yes [name], MD  omeprazole (PRILOSEC) 20 MG capsule TAKE 1 CAPSULE BY MOUTH 2 TIMES DAILY. Patient taking differently: TAKE 1 CAPSULE BY MOUTH ONCE DAILY 01/29/15  Yes [name], MD  PARoxetine (PAXIL) 10 MG tablet Take 1 tablet (10 mg total) by mouth daily. 10/28/15  Yes [name], MD  potassium chloride (K-DUR) 10 MEQ tablet TAKE 1 TABLET BY MOUTH 2 TIMES DAILY. 08/22/15  Yes [name], MD  promethazine (PHENERGAN) 25 MG tablet Take 25 mg by mouth every 6 (six) hours as needed for nausea or vomiting.   Yes Historical Provider, MD  fexofenadine (ALLEGRA) 180 MG tablet Take 180 mg by mouth daily as needed (allergies).    Historical Provider, MD  fluticasone (FLONASE) 50 MCG/ACT nasal spray PLACE 1 TO 2 SPRAYS IN EACH NOSTRIL DAILY. Patient not taking: Reported on 01/01/2016 09/08/15   [name], MD  mometasone (ELOCON) 0.1 % cream Apply  to affected area in small amount 3 times weekly as needed 11/10/15   [name], MD    Physical Exam: Vitals:   01/01/16 1728 01/01/16 2002  BP: 154/59 153/93  Pulse: 78 66  Resp: 17 20  Temp: 97.7 F (36.5 C)   TempSrc: Oral   SpO2: 97% 96%      Constitutional: NAD, calm, comfortable Eyes: PERTLA, lids and conjunctivae normal ENMT: Mucous membranes are moist. Posterior pharynx clear of any exudate or lesions.   Neck: normal, supple, no masses, no thyromegaly Respiratory: clear to auscultation bilaterally, no wheezing, no crackles. Normal respiratory effort.   Cardiovascular: S1 & S2 heard, regular rate and rhythm. Trace b/l pedal edema. No significant JVD. Abdomen: No distension, no tenderness, no  masses palpated. Bowel sounds normal.  Musculoskeletal: no clubbing / cyanosis. No joint deformity upper and lower extremities. Ecchymosis overlies left hip.  Skin: no significant rashes, lesions, ulcers. Warm, dry, well-perfused. Neurologic: CN 2-12 grossly intact. Sensation intact, DTR normal. Strength 5/5 in all 4 limbs.  Psychiatric:  Alert. Oriented to person only.      Labs on Admission: I have personally reviewed following labs and imaging studies  CBC:  Recent Labs Lab 01/01/16 1937  WBC 10.0  NEUTROABS 5.9  HGB 14.5  HCT 41.0  MCV 87.8  PLT 307   Basic Metabolic Panel:  Recent Labs Lab 01/01/16 1937  NA 129*  K 2.8*  CL 88*  CO2 31  GLUCOSE 98  BUN 19  CREATININE 0.92  CALCIUM 9.4   GFR: CrCl cannot be calculated (Unknown ideal weight.). Liver Function Tests:  Recent Labs Lab 01/01/16 1937  AST 35  ALT 19  ALKPHOS 60  BILITOT 1.0  PROT 7.2  ALBUMIN 4.4   No results for input(s): LIPASE, AMYLASE in the last 168 hours. No results for input(s): AMMONIA in the last 168 hours. Coagulation Profile: No results for input(s): INR, PROTIME in the last 168 hours. Cardiac Enzymes:  Recent Labs Lab 01/01/16 1937  CKTOTAL 696*   BNP (last 3  results) No results for input(s): PROBNP in the last 8760 hours. HbA1C: No results for input(s): HGBA1C in the last 72 hours. CBG: No results for input(s): GLUCAP in the last 168 hours. Lipid Profile: No results for input(s): CHOL, HDL, LDLCALC, TRIG, CHOLHDL, LDLDIRECT in the last 72 hours. Thyroid Function Tests: No results for input(s): TSH, T4TOTAL, FREET4, T3FREE, THYROIDAB in the last 72 hours. Anemia Panel: No results for input(s): VITAMINB12, FOLATE, FERRITIN, TIBC, IRON, RETICCTPCT in the last 72 hours. Urine analysis:    Component Value Date/Time   COLORURINE YELLOW 01/01/2016 1957   APPEARANCEUR CLEAR 01/01/2016 1957   LABSPEC 1.004 (L) 01/01/2016 1957   PHURINE 7.0 01/01/2016 1957   GLUCOSEU NEGATIVE 01/01/2016 1957   HGBUR SMALL (A) 01/01/2016 1957   HGBUR small 10/02/2009 0840   BILIRUBINUR NEGATIVE 01/01/2016 1957   BILIRUBINUR Neg. 03/24/2015 1551   KETONESUR NEGATIVE 01/01/2016 1957   PROTEINUR NEGATIVE 01/01/2016 1957   UROBILINOGEN 0.2 03/24/2015 1551   UROBILINOGEN 0.2 08/30/2012 2259   NITRITE NEGATIVE 01/01/2016 1957   LEUKOCYTESUR MODERATE (A) 01/01/2016 1957   Sepsis Labs: (procalcitonin:4,lacticidven:4) )No results found for this or any previous visit (from the past 240 hour(s)).   Radiological Exams on Admission: Ct Head Wo Contrast  Result Date: 01/01/2016 CLINICAL DATA:  82 year old female with altered mental status, reportedly fell last night. Initial encounter. EXAM: CT HEAD WITHOUT CONTRAST CT CERVICAL SPINE WITHOUT CONTRAST TECHNIQUE: Multidetector CT imaging of the head and cervical spine was performed following the standard protocol without intravenous contrast. Multiplanar CT image reconstructions of the cervical spine were also generated. COMPARISON:  Cervical spine CT 08/08/2012 FINDINGS: CT HEAD FINDINGS Visualized paranasal sinuses and mastoids are well pneumatized. No scalp hematoma identified. Negative orbits soft tissues.  Calvarium intact. Calcified atherosclerosis at the skull base. Chronic lacunar infarct left basal ganglia. Associated patchy bilateral cerebral white matter hypodensity. No midline shift, mass effect, or evidence of intracranial mass lesion. No cortically based acute infarct identified. No acute intracranial hemorrhage identified. CT CERVICAL SPINE FINDINGS Motion artifact below the C5 level. Significant loss of bone detail at C6. Cervicothoracic junction alignment appears maintained. Visualized skull base is intact. No atlanto-occipital dissociation. No definite cervical spine  fracture. Visualized upper thoracic levels appear stable. Negative noncontrast paraspinal soft tissues aside from calcified carotid artery atherosclerosis. Negative lung apices. IMPRESSION: 1. No acute intracranial abnormality. Chronic small vessel ischemia most pronounced in the left basal ganglia. 2. C6 cervical spine level degraded by motion. No fracture identified in the cervical spine. Ligamentous injury is not excluded. Electronically Signed   By: Odessa Fleming M.D.   On: 01/01/2016 19:21   Ct Cervical Spine Wo Contrast  Result Date: 01/01/2016 CLINICAL DATA:  82 year old female with altered mental status, reportedly fell last night. Initial encounter. EXAM: CT HEAD WITHOUT CONTRAST CT CERVICAL SPINE WITHOUT CONTRAST TECHNIQUE: Multidetector CT imaging of the head and cervical spine was performed following the standard protocol without intravenous contrast. Multiplanar CT image reconstructions of the cervical spine were also generated. COMPARISON:  Cervical spine CT 08/08/2012 FINDINGS: CT HEAD FINDINGS Visualized paranasal sinuses and mastoids are well pneumatized. No scalp hematoma identified. Negative orbits soft tissues. Calvarium intact. Calcified atherosclerosis at the skull base. Chronic lacunar infarct left basal ganglia. Associated patchy bilateral cerebral white matter hypodensity. No midline shift, mass effect, or evidence of  intracranial mass lesion. No cortically based acute infarct identified. No acute intracranial hemorrhage identified. CT CERVICAL SPINE FINDINGS Motion artifact below the C5 level. Significant loss of bone detail at C6. Cervicothoracic junction alignment appears maintained. Visualized skull base is intact. No atlanto-occipital dissociation. No definite cervical spine fracture. Visualized upper thoracic levels appear stable. Negative noncontrast paraspinal soft tissues aside from calcified carotid artery atherosclerosis. Negative lung apices. IMPRESSION: 1. No acute intracranial abnormality. Chronic small vessel ischemia most pronounced in the left basal ganglia. 2. C6 cervical spine level degraded by motion. No fracture identified in the cervical spine. Ligamentous injury is not excluded. Electronically Signed   By: Odessa Fleming M.D.   On: 01/01/2016 19:21   Dg Hip Unilat W Or Wo Pelvis 2-3 Views Left  Result Date: 01/01/2016 CLINICAL DATA:  Left hip pain status post fall. EXAM: DG HIP (WITH OR WITHOUT PELVIS) 2-3V LEFT COMPARISON:  None. FINDINGS: There is no evidence of hip fracture or dislocation. There is no evidence of arthropathy or other focal bone abnormality. Soft tissue bruising is seen overlying the greater trochanter. IMPRESSION: No acute fracture or dislocation identified about the left hip or pelvis. Electronically Signed   By: Ted Mcalpine M.D.   On: 01/01/2016 18:46    EKG: Independently reviewed. Sinus rhythm, early R transition  Assessment/Plan  1. Acute encephalopathy - Suspect this to be toxic-metabolic in setting of UTI and significant electrolyte derangements  - Head CT is negative for acute intracranial abnormality  - Will check TSH, RPR, B12, folate, ammonia levels  - Treat UTI and correct electrolyte disturbances as below  - If fails to resolve as expected with treatment of UTI and electrolytes, may need to extend w/u    2. Hypokalemia, hyponatremia  - Serum sodium 129 on  admission; had been 130 on last check in June 2017  - Serum potassium 2.8 on admission; had been 4.2 on last check in June 2017  - Suspect this is secondary to HCTZ use and it will be held  - 20 mEq IV potassium given in ED; 20 mEq given orally upon admission  - Check mag level and replete prn  - Repeat chem panel in am    3. UTI - UA suggests possible infection and sample has been sent for culture  - Pt is confused on admission, but denies urinary sxs  -  Given the AMS, which she has developed before with UTI, plan to treat empirically despite unclear sxs  - Prior cultures have been positive for Enterococcus with the usual sensitivities, and E coli resistant to ampicillin, Cipro, gentamycin, Levaquin, and tobramycin - She has numerous reported drug allergies, including penicillins (unknown reaction)  - She received an empiric dose of Cipro in ED; will plan to continue this empirically while following culture    4. Fall  - Pt suffered unwitnessed fall sometime the night of 12/31/14 - There is ecchymosis to left hip, but pt able to ambulate and radiographs negative for fx or dislocation  - CT head and cervical spine are negative for acute pathology  - Placed on fall precautions - PT eval requested   5. Hypertension  - At goal currently  - Managed with HCTZ 50 mg daily at home  - Will hold HCTZ while correcting lytes  - She has reported allergy to Norvasc (edema)    - Might do better with an ACE-i, will defer to PCP  6. Depression  - Appears to stable - Continue Paxil at current-dose    DVT prophylaxis: sq Lovenox  Code Status: Full  Family Communication: Daughter updated at bedside  Disposition Plan: Admit to telemetry  Consults called: None Admission status: Inpatient    Briscoe Deutscher, MD [facility] Pager [phone]  If 7PM-7AM, please contact night-coverage [email or web address] Password [email or web address]  01/01/2016, 10:18 PM

## 2016-01-01 NOTE — ED Notes (Signed)
Patient being transported to Xray

## 2016-01-01 NOTE — ED Triage Notes (Addendum)
Patient presents to [facility] via [facility]. Patient lives alone at home but family was somehow notified of a suspected fall last night. They also became concerned that patient was altered. Last known normal for patient was sometime yesterday morning. Upon assessment patient was discovered to have large bruise on her left hip and was pulling saran wrap off of her buttocks. She stated she had 'toilet paper on her behind.' Unclear how the saran wrap came to be in place.

## 2016-01-02 DIAGNOSIS — N390 Urinary tract infection, site not specified: Secondary | ICD-10-CM | POA: Diagnosis not present

## 2016-01-02 DIAGNOSIS — I10 Essential (primary) hypertension: Secondary | ICD-10-CM | POA: Diagnosis not present

## 2016-01-02 DIAGNOSIS — G9340 Encephalopathy, unspecified: Secondary | ICD-10-CM

## 2016-01-02 DIAGNOSIS — W19XXXD Unspecified fall, subsequent encounter: Secondary | ICD-10-CM | POA: Diagnosis not present

## 2016-01-02 DIAGNOSIS — E876 Hypokalemia: Secondary | ICD-10-CM

## 2016-01-02 DIAGNOSIS — E871 Hypo-osmolality and hyponatremia: Secondary | ICD-10-CM

## 2016-01-02 LAB — HIV ANTIBODY (ROUTINE TESTING W REFLEX): HIV Screen 4th Generation wRfx: NONREACTIVE

## 2016-01-02 LAB — BASIC METABOLIC PANEL
Anion gap: 10 (ref 5–15)
BUN: 12 mg/dL (ref 6–20)
CO2: 28 mmol/L (ref 22–32)
Calcium: 9.3 mg/dL (ref 8.9–10.3)
Chloride: 96 mmol/L — ABNORMAL LOW (ref 101–111)
Creatinine, Ser: 0.83 mg/dL (ref 0.44–1.00)
Glucose, Bld: 99 mg/dL (ref 65–99)
POTASSIUM: 3.3 mmol/L — AB (ref 3.5–5.1)
SODIUM: 134 mmol/L — AB (ref 135–145)

## 2016-01-02 LAB — MAGNESIUM: MAGNESIUM: 1.5 mg/dL — AB (ref 1.7–2.4)

## 2016-01-02 LAB — RPR: RPR: NONREACTIVE

## 2016-01-02 LAB — GLUCOSE, CAPILLARY: Glucose-Capillary: 107 mg/dL — ABNORMAL HIGH (ref 65–99)

## 2016-01-02 LAB — AMMONIA: AMMONIA: 16 umol/L (ref 9–35)

## 2016-01-02 LAB — TSH: TSH: 3.592 u[IU]/mL (ref 0.350–4.500)

## 2016-01-02 LAB — VITAMIN B12: Vitamin B-12: 414 pg/mL (ref 180–914)

## 2016-01-02 LAB — CK: CK TOTAL: 561 U/L — AB (ref 38–234)

## 2016-01-02 MED ORDER — HYDRALAZINE HCL 20 MG/ML IJ SOLN: 10.0000 mg | INTRAMUSCULAR | Status: DC | PRN

## 2016-01-02 MED ORDER — CHLORHEXIDINE GLUCONATE 0.12 % MT SOLN
15.0000 mL | Freq: Two times a day (BID) | OROMUCOSAL | Status: DC
  Administered 2016-01-02 – 2016-01-03 (×3): 15 mL via OROMUCOSAL
  Filled 2016-01-02 (×3): qty 15

## 2016-01-02 MED ORDER — CIPROFLOXACIN IN D5W 400 MG/200ML IV SOLN
400.0000 mg | Freq: Two times a day (BID) | INTRAVENOUS | Status: DC
  Administered 2016-01-02 (×2): 400 mg via INTRAVENOUS
  Filled 2016-01-02 (×2): qty 200

## 2016-01-02 MED ORDER — CETYLPYRIDINIUM CHLORIDE 0.05 % MT LIQD: 7.0000 mL | Freq: Two times a day (BID) | OROMUCOSAL | Status: DC

## 2016-01-02 MED ORDER — SODIUM CHLORIDE 0.9 % IV SOLN
INTRAVENOUS | Status: DC
Start: 2016-01-02 — End: 2016-01-03
  Administered 2016-01-02: 18:00:00 via INTRAVENOUS

## 2016-01-02 MED ORDER — MAGNESIUM SULFATE 2 GM/50ML IV SOLN
2.0000 g | Freq: Once | INTRAVENOUS | Status: AC
  Administered 2016-01-02: 2 g via INTRAVENOUS
  Filled 2016-01-02: qty 50

## 2016-01-02 NOTE — Care Management Note (Signed)
Case Management Note  Patient Details  Name: Amber Rollins MRN: [id number] Date of Birth: [birth year]  Subjective/Objective: 82 y/o f admitted w/Acute encephalopathy. From home. PT cons-await recc.                   Action/Plan:d/c plan home.   Expected Discharge Date:   (unknown)               Expected Discharge Plan:  Home w Home Health Services  In-House Referral:  Clinical Social Work  Discharge planning Services  CM Consult  Post Acute Care Choice:    Choice offered to:     DME Arranged:    DME Agency:     HH Arranged:    HH Agency:     Status of Service:  In process, will continue to follow  If discussed at Long Length of Stay Meetings, dates discussed:    Additional Comments:  Lanier Clam, RN 01/02/2016, 9:47 AM

## 2016-01-02 NOTE — Evaluation (Addendum)
Physical Therapy Evaluation Patient Details Name: Natoria Archibald MRN: [id number] DOB: 1930-12-07 Today's Date: 01/02/2016   History of Present Illness  82 yo female admitted with acute encephalopathy, fall. Hx of chronic foot pain-neuroma, OA, osteopenia, HTN, mild dementia.   Clinical Impression  On eval, pt required Min-Mod assist for mobility. She walked ~100 feet with a RW. Pt is very unsteady and at high risk for falls. LOB multiple times during session. Recommend ST rehab at SNF if pt is agreeable. If pt refuses SNF, recommend HHPT and 24 hour supervision/assist and RW use for ambulation.     Follow Up Recommendations SNF;Supervision/Assistance - 24 hour (if pt will agree. If she refuses, then HHPT)    Equipment Recommendations  Rolling walker with 5" wheels    Recommendations for Other Services       Precautions / Restrictions Precautions Precautions: Fall Restrictions Weight Bearing Restrictions: No      Mobility  Bed Mobility Overal bed mobility: Needs Assistance Bed Mobility: Supine to Sit     Supine to sit: HOB elevated;Mod assist     General bed mobility comments: assist for trunk and LEs. Increased time.   Transfers Overall transfer level: Needs assistance Equipment used: Rolling walker (2 wheeled) Transfers: Sit to/from [employer] Sit to Stand: Min assist Stand pivot transfers: Min assist       General transfer comment: Assist to rise, stabilize, control descent. Stand pivot, bed to bsc, with pt holding on to armrest. LOB x1 requiring assist to prevent fall  Ambulation/Gait Ambulation/Gait assistance: Min assist Ambulation Distance (Feet): 100 Feet Assistive device: Rolling walker (2 wheeled) Gait Pattern/deviations: Step-through pattern;Decreased stride length     General Gait Details: LOB x1 requring assist to prevent fall. Pt fatigued after walk. Assist to stabilize throughout distance.   Stairs             Wheelchair Mobility    Modified Rankin (Stroke Patients Only)       Balance Overall balance assessment: Needs assistance;History of Falls         Standing balance support: Bilateral upper extremity supported Standing balance-Leahy Scale: Poor                               Pertinent Vitals/Pain Pain Assessment: Faces Faces Pain Scale: Hurts even more Pain Location: "so sore all over" Pain Descriptors / Indicators: Sore Pain Intervention(s): Monitored during session;Repositioned    Home Living Family/patient expects to be discharged to:: Private residence Living Arrangements: Alone   Type of Home: House Home Access: Stairs to enter   Secretary/administrator of Steps: 1-2 Home Layout: One level        Prior Function                 Hand Dominance        Extremity/Trunk Assessment   Upper Extremity Assessment: Generalized weakness           Lower Extremity Assessment: Generalized weakness      Cervical / Trunk Assessment: Kyphotic  Communication      Cognition Arousal/Alertness: Awake/alert Behavior During Therapy: WFL for tasks assessed/performed Overall Cognitive Status: Within Functional Limits for tasks assessed                      General Comments      Exercises        Assessment/Plan    PT Assessment Patient needs continued  PT services  PT Diagnosis Difficulty walking;Generalized weakness;Acute pain   PT Problem List Decreased strength;Decreased activity tolerance;Decreased balance;Decreased coordination;Decreased mobility;Pain;Decreased knowledge of use of DME  PT Treatment Interventions DME instruction;Gait training;Stair training;Functional mobility training;Therapeutic exercise;Therapeutic activities;Balance training;Patient/family education   PT Goals (Current goals can be found in the Care Plan section) Acute Rehab PT Goals Patient Stated Goal: to get better and return home PT Goal Formulation:  With patient Time For Goal Achievement: 01/16/16 Potential to Achieve Goals: Good    Frequency Min 3X/week   Barriers to discharge        Co-evaluation               End of Session Equipment Utilized During Treatment: Gait belt Activity Tolerance: Patient tolerated treatment well Patient left: in chair;with call bell/phone within reach;with chair alarm set           Time: 6720-9470 PT Time Calculation (min) (ACUTE ONLY): 22 min   Charges:   PT Evaluation $PT Eval Low Complexity: 1 Procedure     PT G Codes:        Rebeca Alert, MPT Pager: [phone]

## 2016-01-02 NOTE — Progress Notes (Signed)
PROGRESS NOTE    Amber Rollins  WJX:[id number]  DOB: [birth year]  DOA: 01/01/2016 PCP: Roxy Manns, MD Outpatient Specialists:  Hospital course: [name] was found down at home on floor for several hours after a fall at home.  She was severely dehydrated, hypokalemic and hyponatremic.  On arrival, pt found to be afebrile, saturating well on room air, and with vital signs stable. EKG demonstrates a sinus rhythm with early R transition. Noncontrast CT of the head and cervical spine are negative for acute abnormalities. Radiographs of the left hip are negative for acute fracture or dislocation. CMP is notable for sodium of 129 and potassium of 2.8. CBC is unremarkable and troponin is 0.02. CK is mildly elevated to 696. Urinalysis features few bacteria, moderate leukocyte, negative nitrite, 6-30 WBC, small hemoglobin, and low specific gravity. Patient was given 20 mEq of IV potassium, a gentle IV fluid hydration with normal saline, and empiric ciprofloxacin was administered for suspected UTI. Urine was sent for culture. Patient remains hemodynamically stable in the emergency department and will be admitted to the telemetry unit for ongoing evaluation and management of acute encephalopathy suspected secondary to UTI, complicated by hyponatremia and hypokalemia.   Assessment & Plan:   1. Acute encephalopathy - improving - Suspect this to be toxic-metabolic in setting of UTI and significant electrolyte derangements  - Head CT is negative for acute intracranial abnormality  - TSH, RPR, B12, folate, ammonia levels ok - Treat UTI and correct electrolyte disturbances as below   2. Hypokalemia, hyponatremia  - All of this secondary to high dose HCTZ, which has been discontinued. - Serum potassium 2.8 on admission; had been 4.2 on last check in June 2017  - 20 mEq IV potassium given in ED; 20 mEq given orally upon admission  - Repeat chem panel in am    3. UTI - UA suggests  possible infection and sample has been sent for culture  - Pt is confused on admission, but denies urinary sxs  - Given the AMS, which she has developed before with UTI, plan to treat empirically despite unclear sxs  - Prior cultures have been positive for Enterococcus with the usual sensitivities, and E coli resistant to ampicillin, Cipro, gentamycin, Levaquin, and tobramycin - She has numerous reported drug allergies, including penicillins (unknown reaction)  - She received an empiric dose of Cipro in ED; will plan to continue this empirically while following culture    4. Fall  - Pt suffered unwitnessed fall sometime the night of 12/31/14 - There is ecchymosis to left hip, but pt able to ambulate and radiographs negative for fx or dislocation  - CT head and cervical spine are negative for acute pathology  - Placed on fall precautions - PT eval done 8/4.   Elevated CK - hydrating with IVF, repeat levels, monitoring for rhabdomyolysis.   5. Hypertension  - prn hydralazine ordered - Managed with HCTZ 50 mg daily at home, which is discontinued.  - Will hold HCTZ while correcting lytes  - She has reported intolerance to Norvasc (edema)    - Might do better with an ACE-i, will defer to PCP on outpatient follow up.   6. Depression  - Appears to stable - Continue Paxil at current-dose   DVT prophylaxis: sq Lovenox  Code Status: Full  Family Communication: Daughter updated at bedside  Disposition Plan: Admit to telemetry  Consults called: None Admission status: Inpatient   Antimicrobials: Anti-infectives    Start  Dose/Rate Route Frequency Ordered Stop   01/02/16 1000  ciprofloxacin (CIPRO) IVPB 400 mg     400 mg 200 mL/hr over 60 Minutes Intravenous Every 12 hours 01/02/16 0021     01/01/16 2045  ciprofloxacin (CIPRO) IVPB 400 mg     400 mg 200 mL/hr over 60 Minutes Intravenous  Once 01/01/16 2043 01/02/16 0042       Subjective: Pt much more alert, no distress, clear  and oriented now  Objective: Vitals:   01/01/16 2002 01/01/16 2241 01/02/16 0356 01/02/16 0617  BP: 153/93 (!) 184/70  (!) 161/71  Pulse: 66 69  67  Resp: 20 18  18   Temp:  97.8 F (36.6 C)  97.7 F (36.5 C)  TempSrc:  Oral  Oral  SpO2: 96% 99%  98%  Weight:  57.4 kg (126 lb 8.7 oz) 56.2 kg (123 lb 14.4 oz)   Height:  5\' 2"  (1.575 m)      Intake/Output Summary (Last 24 hours) at 01/02/16 1410 Last data filed at 01/02/16 1130  Gross per 24 hour  Intake            737.5 ml  Output              200 ml  Net            537.5 ml   Filed Weights   01/01/16 2241 01/02/16 0356  Weight: 57.4 kg (126 lb 8.7 oz) 56.2 kg (123 lb 14.4 oz)   Exam:  Constitutional: NAD, calm, comfortable Eyes: PERTLA, lids and conjunctivae normal ENMT: Mucous membranes are dry. Posterior pharynx clear of any exudate or lesions.   Neck: normal, supple, no masses, no thyromegaly Respiratory: clear to auscultation bilaterally, no wheezing, no crackles. Normal respiratory effort.   Cardiovascular: S1 & S2 heard, regular rate and rhythm. Trace b/l pedal edema. No significant JVD. Abdomen: No distension, no tenderness, no masses palpated. Bowel sounds normal.  Musculoskeletal: no clubbing / cyanosis. No joint deformity upper and lower extremities. Ecchymosis overlies left hip.  Skin: no significant rashes, lesions, ulcers. Warm, dry, well-perfused. Neurologic: CN 2-12 grossly intact. Sensation intact, DTR normal. Strength 5/5 in all 4 limbs.  Psychiatric:  Alert. Oriented x 3.     Data Reviewed: Basic Metabolic Panel:  Recent Labs Lab 01/01/16 1937 01/01/16 2334 01/02/16 0541  NA 129*  --  134*  K 2.8*  --  3.3*  CL 88*  --  96*  CO2 31  --  28  GLUCOSE 98  --  99  BUN 19  --  12  CREATININE 0.92  --  0.83  CALCIUM 9.4  --  9.3  MG  --  1.5*  --    Liver Function Tests:  Recent Labs Lab 01/01/16 1937  AST 35  ALT 19  ALKPHOS 60  BILITOT 1.0  PROT 7.2  ALBUMIN 4.4   No results for  input(s): LIPASE, AMYLASE in the last 168 hours.  Recent Labs Lab 01/01/16 2334  AMMONIA 16   CBC:  Recent Labs Lab 01/01/16 1937  WBC 10.0  NEUTROABS 5.9  HGB 14.5  HCT 41.0  MCV 87.8  PLT 307   Cardiac Enzymes:  Recent Labs Lab 01/01/16 1937 01/02/16 0541  CKTOTAL 696* 561*   CBG (last 3)   Recent Labs  01/02/16 0725  GLUCAP 107*   No results found for this or any previous visit (from the past 240 hour(s)).   Studies: Ct Head Wo Contrast  Result  Date: 01/01/2016 CLINICAL DATA:  82 year old female with altered mental status, reportedly fell last night. Initial encounter. EXAM: CT HEAD WITHOUT CONTRAST CT CERVICAL SPINE WITHOUT CONTRAST TECHNIQUE: Multidetector CT imaging of the head and cervical spine was performed following the standard protocol without intravenous contrast. Multiplanar CT image reconstructions of the cervical spine were also generated. COMPARISON:  Cervical spine CT 08/08/2012 FINDINGS: CT HEAD FINDINGS Visualized paranasal sinuses and mastoids are well pneumatized. No scalp hematoma identified. Negative orbits soft tissues. Calvarium intact. Calcified atherosclerosis at the skull base. Chronic lacunar infarct left basal ganglia. Associated patchy bilateral cerebral white matter hypodensity. No midline shift, mass effect, or evidence of intracranial mass lesion. No cortically based acute infarct identified. No acute intracranial hemorrhage identified. CT CERVICAL SPINE FINDINGS Motion artifact below the C5 level. Significant loss of bone detail at C6. Cervicothoracic junction alignment appears maintained. Visualized skull base is intact. No atlanto-occipital dissociation. No definite cervical spine fracture. Visualized upper thoracic levels appear stable. Negative noncontrast paraspinal soft tissues aside from calcified carotid artery atherosclerosis. Negative lung apices. IMPRESSION: 1. No acute intracranial abnormality. Chronic small vessel ischemia most  pronounced in the left basal ganglia. 2. C6 cervical spine level degraded by motion. No fracture identified in the cervical spine. Ligamentous injury is not excluded. Electronically Signed   By: Odessa Fleming M.D.   On: 01/01/2016 19:21   Ct Cervical Spine Wo Contrast  Result Date: 01/01/2016 CLINICAL DATA:  82 year old female with altered mental status, reportedly fell last night. Initial encounter. EXAM: CT HEAD WITHOUT CONTRAST CT CERVICAL SPINE WITHOUT CONTRAST TECHNIQUE: Multidetector CT imaging of the head and cervical spine was performed following the standard protocol without intravenous contrast. Multiplanar CT image reconstructions of the cervical spine were also generated. COMPARISON:  Cervical spine CT 08/08/2012 FINDINGS: CT HEAD FINDINGS Visualized paranasal sinuses and mastoids are well pneumatized. No scalp hematoma identified. Negative orbits soft tissues. Calvarium intact. Calcified atherosclerosis at the skull base. Chronic lacunar infarct left basal ganglia. Associated patchy bilateral cerebral white matter hypodensity. No midline shift, mass effect, or evidence of intracranial mass lesion. No cortically based acute infarct identified. No acute intracranial hemorrhage identified. CT CERVICAL SPINE FINDINGS Motion artifact below the C5 level. Significant loss of bone detail at C6. Cervicothoracic junction alignment appears maintained. Visualized skull base is intact. No atlanto-occipital dissociation. No definite cervical spine fracture. Visualized upper thoracic levels appear stable. Negative noncontrast paraspinal soft tissues aside from calcified carotid artery atherosclerosis. Negative lung apices. IMPRESSION: 1. No acute intracranial abnormality. Chronic small vessel ischemia most pronounced in the left basal ganglia. 2. C6 cervical spine level degraded by motion. No fracture identified in the cervical spine. Ligamentous injury is not excluded. Electronically Signed   By: Odessa Fleming M.D.   On:  01/01/2016 19:21   Dg Hip Unilat W Or Wo Pelvis 2-3 Views Left  Result Date: 01/01/2016 CLINICAL DATA:  Left hip pain status post fall. EXAM: DG HIP (WITH OR WITHOUT PELVIS) 2-3V LEFT COMPARISON:  None. FINDINGS: There is no evidence of hip fracture or dislocation. There is no evidence of arthropathy or other focal bone abnormality. Soft tissue bruising is seen overlying the greater trochanter. IMPRESSION: No acute fracture or dislocation identified about the left hip or pelvis. Electronically Signed   By: Ted Mcalpine M.D.   On: 01/01/2016 18:46   Scheduled Meds: . antiseptic oral rinse  7 mL Mouth Rinse q12n4p  . azelastine  2 spray Each Nare BID  . chlorhexidine  15 mL Mouth Rinse  BID  . cholecalciferol  2,000 Units Oral BID  . ciprofloxacin  400 mg Intravenous Q12H  . enoxaparin (LOVENOX) injection  40 mg Subcutaneous QHS  . loratadine  10 mg Oral Daily  . pantoprazole  40 mg Oral Daily  . PARoxetine  10 mg Oral Daily  . potassium chloride  20 mEq Oral BID  . sodium chloride flush  3 mL Intravenous Q12H   Continuous Infusions:   Principal Problem:   Acute encephalopathy Active Problems:   HYPOKALEMIA   Essential hypertension   Hypokalemia   Fall   UTI (lower urinary tract infection)   Depression   Hyponatremia  Time spent:   Standley Dakins, MD, FAAFP [facility] Pager [phone]  If 7PM-7AM, please contact night-coverage [email or web address] Password [email or web address] 01/02/2016, 2:10 PM    LOS: 1 day

## 2016-01-02 NOTE — Care Management Note (Signed)
Case Management Note  Patient Details  Name: Amber Rollins MRN: [id number] Date of Birth: [birth year]  Subjective/Objective: PT recc-SNF. Patient pleasantly declines SNF. Provided w/HHC agency(intermittent services) for choice-await choice, & HHC orders-recc HHRN/PT/OT/aide/SW. Patient home alone,but son lives down the street,& family supportive. Patient has cane,rw,3n1.                   Action/Plan:d/c home w/HHC.   Expected Discharge Date:   (unknown)               Expected Discharge Plan:  Home w Home Health Services  In-House Referral:  Clinical Social Work  Discharge planning Services  CM Consult  Post Acute Care Choice:    Choice offered to:  Patient  DME Arranged:    DME Agency:     HH Arranged:    HH Agency:     Status of Service:  In process, will continue to follow  If discussed at Long Length of Stay Meetings, dates discussed:    Additional Comments:  Lanier Clam, RN 01/02/2016, 2:47 PM

## 2016-01-02 NOTE — Progress Notes (Signed)
CSW received consult for SNF placement, spoke with patient re: discharge planning. Patient informed CSW that she had been to [facility] in the past but would prefer to return home with family/friend support. RNCM, Olegario Messier made aware.   No further CSW needs identified - CSW signing off.   Lincoln Maxin, LCSW [facility] Clinical Social Worker cell #: [phone]

## 2016-01-03 DIAGNOSIS — G9340 Encephalopathy, unspecified: Secondary | ICD-10-CM | POA: Diagnosis not present

## 2016-01-03 DIAGNOSIS — I10 Essential (primary) hypertension: Secondary | ICD-10-CM | POA: Diagnosis not present

## 2016-01-03 DIAGNOSIS — N390 Urinary tract infection, site not specified: Secondary | ICD-10-CM | POA: Diagnosis not present

## 2016-01-03 DIAGNOSIS — W19XXXD Unspecified fall, subsequent encounter: Secondary | ICD-10-CM | POA: Diagnosis not present

## 2016-01-03 LAB — URINE CULTURE

## 2016-01-03 LAB — CK: CK TOTAL: 225 U/L (ref 38–234)

## 2016-01-03 LAB — COMPREHENSIVE METABOLIC PANEL
ALK PHOS: 53 U/L (ref 38–126)
ALT: 16 U/L (ref 14–54)
AST: 26 U/L (ref 15–41)
Albumin: 3.7 g/dL (ref 3.5–5.0)
Anion gap: 7 (ref 5–15)
BUN: 15 mg/dL (ref 6–20)
CHLORIDE: 100 mmol/L — AB (ref 101–111)
CO2: 27 mmol/L (ref 22–32)
Calcium: 8.7 mg/dL — ABNORMAL LOW (ref 8.9–10.3)
Creatinine, Ser: 0.97 mg/dL (ref 0.44–1.00)
GFR, EST NON AFRICAN AMERICAN: 53 mL/min — AB (ref >60)
Glucose, Bld: 106 mg/dL — ABNORMAL HIGH (ref 65–99)
POTASSIUM: 3.8 mmol/L (ref 3.5–5.1)
Sodium: 134 mmol/L — ABNORMAL LOW (ref 135–145)
Total Bilirubin: 0.6 mg/dL (ref 0.3–1.2)
Total Protein: 6.2 g/dL — ABNORMAL LOW (ref 6.5–8.1)

## 2016-01-03 LAB — MAGNESIUM: MAGNESIUM: 2 mg/dL (ref 1.7–2.4)

## 2016-01-03 LAB — GLUCOSE, CAPILLARY: Glucose-Capillary: 103 mg/dL — ABNORMAL HIGH (ref 65–99)

## 2016-01-03 MED ORDER — CIPROFLOXACIN HCL 250 MG PO TABS: 250.0000 mg | ORAL_TABLET | Freq: Two times a day (BID) | ORAL | Status: DC

## 2016-01-03 MED ORDER — CIPROFLOXACIN HCL 250 MG PO TABS: 250.0000 mg | ORAL_TABLET | Freq: Two times a day (BID) | ORAL | 0 refills | Status: AC

## 2016-01-03 NOTE — Discharge Summary (Signed)
Physician Discharge Summary  Amber Rollins WUX:[id number] DOB: 10/18/1930 DOA: 01/01/2016  PCP: Roxy Manns, MD  Admit date: 01/01/2016 Discharge date: 01/03/2016  Admitted From: Home Disposition:  Home (Pt refused SNF placement as recommended)  Recommendations for Outpatient Follow-up:  1. Follow up with PCP in 1 weeks 2. Please obtain BMP/CBC in one week  Home Health: YES, HHRN, PT, AIDE  Discharge Condition: STABLE CODE STATUS: FULL  Brief/Interim Summary: Hospital course: Amber Rollins was found down at home on floor for 80 hours after a fall at home.  She was severely dehydrated, hypokalemic and hyponatremic.  On arrival, pt found to be afebrile, saturating well on room air, and with vital signs stable. EKG demonstrates a sinus rhythm with early Rtransition. Noncontrast CT of the head and cervical spine are negative for acute abnormalities. Radiographs of the left hip are negative for acute fracture or dislocation. CMP is notable for sodium of 129 and potassium of 2.8. CBC is unremarkable and troponin is 0.02. CK is mildly elevated to 696. Urinalysis features few bacteria, moderate leukocyte, negative nitrite, 6-30 WBC, small hemoglobin, and low specific gravity. Patient was given 20 mEq of IV potassium, a gentle IV fluid hydration with normal saline, and empiric ciprofloxacin was administered for suspected UTI. Urine was sent for culture. Patient remains hemodynamically stable in the emergency department and will be admitted to the telemetry unit for ongoing evaluation and management of acute encephalopathy suspected secondary to UTI, complicated by hyponatremia and hypokalemia.   Assessment & Plan:   1. Acute encephalopathy - resolved now - Suspect this was toxic-metabolic in setting of UTI and significant electrolyte derangements  - Head CT is negative for acute intracranial abnormality  - TSH, RPR, B12, folate, ammonia levels ok - Treated UTI and corrected  electrolyte disturbances as below   2. Hypokalemia, hyponatremia - resolved now - All of this secondary to high dose HCTZ, which has been discontinued. - Serum potassium 2.8 on admission; had been 4.2 on last check in June 2017  - Potassium was repleted. FOLLOW UP WITH PCP - Repeat chem panel in am   3. UTI - UA suggests possible infection but urine culture inconclusive  - Pt was confused on admission, but denied urinary sxs  - Given the AMS, which she has developed before with UTI, plan to treat empirically despite unclear sxs  - Prior cultures have been positive for Enterococcus with the usual sensitivities, and E coli resistant to ampicillin, Cipro, gentamycin, Levaquin, and tobramycin - She has numerous reported drug allergies, including penicillins (unknown reaction)  - finish course of ciprofloxacin.  4. Fall  - Pt suffered unwitnessed fall sometime the night of 80/2/16 - There is ecchymosis to left hip, but pt able to ambulate and radiographs negative for fx or dislocation  - CT head and cervical spine are negative for acute pathology  - Home Fall prevention safety education material provided.  - PT eval done 8/4. Recommended SNF but patient refused. Arranged Home health RN, PT, Aide  Elevated CK - Resolved with fluid hydration.   5. Hypertension  - Managed with HCTZ 50 mg daily at home, which is discontinued.  - Will hold HCTZ while correcting lytes  - She has reported intolerance to Norvasc (edema)  - Might do better with an ACE-i, will defer to PCP on outpatient follow up.   6. Depression  - Appears to stable - Continue Paxil at current-dose   DVT prophylaxis:sq Lovenox  Code Status:Full  Family Communication:Daughter  updated  Disposition Plan:Home with HH, Refused SNF  Consults called:None  Discharge Diagnoses:  Principal Problem:   Acute encephalopathy Active Problems:   HYPOKALEMIA   Essential hypertension   Hypokalemia   Fall   UTI (lower  urinary tract infection)   Depression   Hyponatremia  Discharge Instructions  Discharge Instructions    Increase activity slowly    Complete by:  As directed       Medication List    STOP taking these medications   doxycycline 100 MG capsule Commonly known as:  VIBRAMYCIN   hydrochlorothiazide 25 MG tablet Commonly known as:  HYDRODIURIL   potassium chloride 10 MEQ tablet Commonly known as:  K-DUR   promethazine 25 MG tablet Commonly known as:  PHENERGAN     TAKE these medications   azelastine 0.1 % nasal spray Commonly known as:  ASTELIN PLACE 2 SPRAYS INTO BOTH NOSTRILS 2 TIMES DAILY.   ciprofloxacin 250 MG tablet Commonly known as:  CIPRO Take 1 tablet (250 mg total) by mouth 2 (two) times daily.   fexofenadine 180 MG tablet Commonly known as:  ALLEGRA Take 180 mg by mouth daily as needed (allergies).   fluticasone 50 MCG/ACT nasal spray Commonly known as:  FLONASE PLACE 1 TO 2 SPRAYS IN EACH NOSTRIL DAILY.   mometasone 0.1 % cream Commonly known as:  ELOCON Apply to affected area in small amount 3 times weekly as needed   omeprazole 20 MG capsule Commonly known as:  PRILOSEC TAKE 1 CAPSULE BY MOUTH 2 TIMES DAILY. What changed:  See the new instructions.   PARoxetine 10 MG tablet Commonly known as:  PAXIL Take 1 tablet (10 mg total) by mouth daily.   Vitamin D3 2000 units Tabs Take 1 tablet by mouth 2 (two) times daily.      Follow-up Information    Roxy Manns, MD. Schedule an appointment as soon as possible for a visit in 1 week(s).   Specialties:  Family Medicine, Radiology Why:  Hospital Follow Up Contact information: [address] [place] [address] [place] [place] [address] [phone]          Allergies  Allergen Reactions  . Tdap [Diphth-Acell Pertussis-Tetanus] Swelling  . Amlodipine Besylate     REACTION: edema  . Codeine     headache  . Hydrocodone     hallucinations  . Lovastatin     REACTION: leg pain  .  Metronidazole Swelling    And body aches  . Penicillins     Unknown reaction; has tolerated cephalosporins  . Sertraline Hcl     REACTION: caused insomnia  . Simvastatin     REACTION: non tolerant- leg pain  . Latex Rash  . Sulfonamide Derivatives Rash   Procedures/Studies: Ct Head Wo Contrast  Result Date: 01/01/2016 CLINICAL DATA:  82 year old female with altered mental status, reportedly fell last night. Initial encounter. EXAM: CT HEAD WITHOUT CONTRAST CT CERVICAL SPINE WITHOUT CONTRAST TECHNIQUE: Multidetector CT imaging of the head and cervical spine was performed following the standard protocol without intravenous contrast. Multiplanar CT image reconstructions of the cervical spine were also generated. COMPARISON:  Cervical spine CT 08/08/2012 FINDINGS: CT HEAD FINDINGS Visualized paranasal sinuses and mastoids are well pneumatized. No scalp hematoma identified. Negative orbits soft tissues. Calvarium intact. Calcified atherosclerosis at the skull base. Chronic lacunar infarct left basal ganglia. Associated patchy bilateral cerebral white matter hypodensity. No midline shift, mass effect, or evidence of intracranial mass lesion. No cortically based acute infarct identified.  No acute intracranial hemorrhage identified. CT CERVICAL SPINE FINDINGS Motion artifact below the C5 level. Significant loss of bone detail at C6. Cervicothoracic junction alignment appears maintained. Visualized skull base is intact. No atlanto-occipital dissociation. No definite cervical spine fracture. Visualized upper thoracic levels appear stable. Negative noncontrast paraspinal soft tissues aside from calcified carotid artery atherosclerosis. Negative lung apices. IMPRESSION: 1. No acute intracranial abnormality. Chronic small vessel ischemia most pronounced in the left basal ganglia. 2. C6 cervical spine level degraded by motion. No fracture identified in the cervical spine. Ligamentous injury is not excluded.  Electronically Signed   By: Odessa Fleming M.D.   On: 01/01/2016 19:21   Ct Cervical Spine Wo Contrast  Result Date: 01/01/2016 CLINICAL DATA:  82 year old female with altered mental status, reportedly fell last night. Initial encounter. EXAM: CT HEAD WITHOUT CONTRAST CT CERVICAL SPINE WITHOUT CONTRAST TECHNIQUE: Multidetector CT imaging of the head and cervical spine was performed following the standard protocol without intravenous contrast. Multiplanar CT image reconstructions of the cervical spine were also generated. COMPARISON:  Cervical spine CT 08/08/2012 FINDINGS: CT HEAD FINDINGS Visualized paranasal sinuses and mastoids are well pneumatized. No scalp hematoma identified. Negative orbits soft tissues. Calvarium intact. Calcified atherosclerosis at the skull base. Chronic lacunar infarct left basal ganglia. Associated patchy bilateral cerebral white matter hypodensity. No midline shift, mass effect, or evidence of intracranial mass lesion. No cortically based acute infarct identified. No acute intracranial hemorrhage identified. CT CERVICAL SPINE FINDINGS Motion artifact below the C5 level. Significant loss of bone detail at C6. Cervicothoracic junction alignment appears maintained. Visualized skull base is intact. No atlanto-occipital dissociation. No definite cervical spine fracture. Visualized upper thoracic levels appear stable. Negative noncontrast paraspinal soft tissues aside from calcified carotid artery atherosclerosis. Negative lung apices. IMPRESSION: 1. No acute intracranial abnormality. Chronic small vessel ischemia most pronounced in the left basal ganglia. 2. C6 cervical spine level degraded by motion. No fracture identified in the cervical spine. Ligamentous injury is not excluded. Electronically Signed   By: Odessa Fleming M.D.   On: 01/01/2016 19:21   Dg Hip Unilat W Or Wo Pelvis 2-3 Views Left  Result Date: 01/01/2016 CLINICAL DATA:  Left hip pain status post fall. EXAM: DG HIP (WITH OR WITHOUT  PELVIS) 2-3V LEFT COMPARISON:  None. FINDINGS: There is no evidence of hip fracture or dislocation. There is no evidence of arthropathy or other focal bone abnormality. Soft tissue bruising is seen overlying the greater trochanter. IMPRESSION: No acute fracture or dislocation identified about the left hip or pelvis. Electronically Signed   By: Ted Mcalpine M.D.   On: 01/01/2016 18:46    Subjective: Pt eating and drinking well.  Again refused SNF placement.  No complaints.    Discharge Exam: Vitals:   01/02/16 2133 01/03/16 0553  BP: 132/74 (!) 159/64  Pulse: 75 67  Resp:    Temp: 97.2 F (36.2 C) 97.5 F (36.4 C)   Vitals:   01/02/16 1415 01/02/16 2133 01/03/16 0553 01/03/16 0656  BP: 123/61 132/74 (!) 159/64   Pulse: 75 75 67   Resp:      Temp: 97.5 F (36.4 C) 97.2 F (36.2 C) 97.5 F (36.4 C)   TempSrc: Oral Oral Oral   SpO2: 96% 100% 95%   Weight:    57.4 kg (126 lb 8.7 oz)  Height:        General: Pt is alert, awake, not in acute distress Cardiovascular: RRR, S1/S2 +, no rubs, no gallops Respiratory: CTA bilaterally, no  wheezing, no rhonchi Abdominal: Soft, NT, ND, bowel sounds + Extremities: no edema, no cyanosis   The results of significant diagnostics from this hospitalization (including imaging, microbiology, ancillary and laboratory) are listed below for reference.     Microbiology: Recent Results (from the past 240 hour(s))  Urine culture     Status: Abnormal   Collection Time: 01/01/16  7:57 PM  Result Value Ref Range Status   Specimen Description URINE, RANDOM  Final   Special Requests NONE  Final   Culture MULTIPLE SPECIES PRESENT, SUGGEST RECOLLECTION (A)  Final   Report Status 01/03/2016 FINAL  Final     Labs: BNP (last 3 results) No results for input(s): BNP in the last 8760 hours. Basic Metabolic Panel:  Recent Labs Lab 01/01/16 1937 01/01/16 2334 01/02/16 0541 01/03/16 0459  NA 129*  --  134* 134*  K 2.8*  --  3.3* 3.8  CL 88*   --  96* 100*  CO2 31  --  28 27  GLUCOSE 98  --  99 106*  BUN 19  --  12 15  CREATININE 0.92  --  0.83 0.97  CALCIUM 9.4  --  9.3 8.7*  MG  --  1.5*  --  2.0   Liver Function Tests:  Recent Labs Lab 01/01/16 1937 01/03/16 0459  AST 35 26  ALT 19 16  ALKPHOS 60 53  BILITOT 1.0 0.6  PROT 7.2 6.2*  ALBUMIN 4.4 3.7   No results for input(s): LIPASE, AMYLASE in the last 168 hours.  Recent Labs Lab 01/01/16 2334  AMMONIA 16   CBC:  Recent Labs Lab 01/01/16 1937  WBC 10.0  NEUTROABS 5.9  HGB 14.5  HCT 41.0  MCV 87.8  PLT 307   Cardiac Enzymes:  Recent Labs Lab 01/01/16 1937 01/02/16 0541 01/03/16 0459  CKTOTAL 696* 561* 225   BNP: Invalid input(s): POCBNP CBG:  Recent Labs Lab 01/02/16 0725 01/03/16 0741  GLUCAP 107* 103*   D-Dimer No results for input(s): DDIMER in the last 72 hours. Hgb A1c No results for input(s): HGBA1C in the last 72 hours. Lipid Profile No results for input(s): CHOL, HDL, LDLCALC, TRIG, CHOLHDL, LDLDIRECT in the last 72 hours. Thyroid function studies  Recent Labs  01/02/16 0541  TSH 3.592   Anemia work up  Recent Labs  01/02/16 0541  VITAMINB12 414   Urinalysis    Component Value Date/Time   COLORURINE YELLOW 01/01/2016 1957   APPEARANCEUR CLEAR 01/01/2016 1957   LABSPEC 1.004 (L) 01/01/2016 1957   PHURINE 7.0 01/01/2016 1957   GLUCOSEU NEGATIVE 01/01/2016 1957   HGBUR SMALL (A) 01/01/2016 1957   HGBUR small 10/02/2009 0840   BILIRUBINUR NEGATIVE 01/01/2016 1957   BILIRUBINUR Neg. 03/24/2015 1551   KETONESUR NEGATIVE 01/01/2016 1957   PROTEINUR NEGATIVE 01/01/2016 1957   UROBILINOGEN 0.2 03/24/2015 1551   UROBILINOGEN 0.2 08/30/2012 2259   NITRITE NEGATIVE 01/01/2016 1957   LEUKOCYTESUR MODERATE (A) 01/01/2016 1957   Sepsis Labs Invalid input(s): PROCALCITONIN,  WBC,  LACTICIDVEN Microbiology Recent Results (from the past 240 hour(s))  Urine culture     Status: Abnormal   Collection Time: 01/01/16   7:57 PM  Result Value Ref Range Status   Specimen Description URINE, RANDOM  Final   Special Requests NONE  Final   Culture MULTIPLE SPECIES PRESENT, SUGGEST RECOLLECTION (A)  Final   Report Status 01/03/2016 FINAL  Final    Time coordinating discharge: 31 minutes  SIGNED:   Standley Dakins, MD  [facility] 01/03/2016, 9:36 AM Pager   If 7PM-7AM, please contact night-coverage [email or web address] Password [email or web address]

## 2016-01-03 NOTE — Care Management Note (Signed)
Case Management Note  Patient Details  Name: Amber Rollins MRN: [id number] Date of Birth: January 16, 1931  Subjective/Objective:                    Action/Plan: Discharge Planning: AVS reviewed: PCP TOWER, MARNE A    NCM spoke to pt and dtr, Phyllis at bedside. Offered choice for HH/provided [facility] list. Agreeable to [facility] for [facility]. Contacted [facility] Liaison for [facility]. Pt has RW, cane and elevated commode seat.    Expected Discharge Date:  01/03/2016              Expected Discharge Plan:  Home w Home Health Services  In-House Referral:  Clinical Social Work  Discharge planning Services  CM Consult  Post Acute Care Choice:  Home Health Choice offered to:  Patient, Adult Children  DME Arranged:  N/A DME Agency:  NA  HH Arranged:  RN, PT, Nurse's Aide HH Agency:  [facility]  Status of Service:  Completed, signed off  If discussed at Long Length of Stay Meetings, dates discussed:    Additional Comments:  Elliot Cousin, RN 01/03/2016, 11:18 AM

## 2016-01-03 NOTE — Progress Notes (Signed)
PHARMACIST - PHYSICIAN COMMUNICATION DR:   Laural Benes CONCERNING: Antibiotic IV to Oral Route Change Policy  RECOMMENDATION: This patient is receiving Cipro by the intravenous route.  Based on criteria approved by the Pharmacy and Therapeutics Committee, the antibiotic(s) is/are being converted to the equivalent oral dose form(s).   DESCRIPTION: These criteria include:  Patient being treated for a respiratory tract infection, urinary tract infection, cellulitis or clostridium difficile associated diarrhea if on metronidazole  The patient is not neutropenic and does not exhibit a GI malabsorption state  The patient is eating (either orally or via tube) and/or has been taking other orally administered medications for a least 24 hours  The patient is improving clinically and has a Tmax < 100.5  If you have questions about this conversion, please contact the Pharmacy Department  []   [phone] )  [facility] []   [phone] )  [facility] []   [phone] )  [facility] []   [phone] )  [facility] [x]   [phone] )  [facility]  Junita Push, PharmD, BCPS Pager: [phone] 01/03/2016@9 :22 AM

## 2016-01-03 NOTE — Discharge Instructions (Signed)
Antibiotic Medicine Antibiotic medicines are used to treat infections caused by bacteria. They work by hurting or killing the germs that are making you sick. HOW WILL MY MEDICINE BE PICKED? There are many kinds of antibiotic medicines. To help your doctor pick one, tell your doctor if:  You have any allergies.  You are pregnant or plan to get pregnant.  You are breastfeeding.  You are taking any medicines. These include over-the-counter medicines, prescription medicines, and herbal remedies.  You have a medical condition or problem. If you have questions about why your medicine was picked, ask. FOR HOW LONG SHOULD I TAKE MY MEDICINE? Take your medicine for as long as your doctor tells you to. Do not stop taking it when you feel better. If you stop taking it too soon:  You may start to feel sick again.  Your infection may get harder to treat.  New problems may develop. WHAT IF I MISS A DOSE? Try not to miss any doses of antibiotic medicine. If you miss a dose:  Take the dose as soon as you can.  If you are taking 2 doses a day, take the next dose in 5 to 6 hours.  If you are taking 3 or more doses a day, take the next dose in 2 to 4 hours. Then go back to the normal schedule. If you cannot take a missed dose, take the next dose on time. Then take the missed dose after you have taken all the doses as told by your doctor, as if you had one more dose left. DOES THIS MEDICINE AFFECT BIRTH CONTROL? Birth control pills may not work while you are on antibiotic medicines. If you are taking birth control pills, keep taking them as usual. Use a second form of birth control, such as a condom. Keep using the second form of birth control until you are finished with your current 1 month cycle of birth control pills. GET HELP IF:  You get worse.  You do not feel better a few days after starting the medicine.  You throw up (vomit).  There are white patches in your mouth.  You have new  joint pain after starting the medicine.  You have new muscle aches after starting the medicine.  You had a fever before starting the medicine, and it comes back.  You have any symptoms of an allergic reaction, such as an itchy rash. If this happens, stop taking the medicine. GET HELP RIGHT AWAY IF:  Your pee (urine) turns dark or becomes blood-colored.  Your skin turns yellow.  You bruise or bleed easily.  You have very bad watery poop (diarrhea) and cramps in your belly (abdomen).  You have a very bad headache.  You have signs of a very bad allergic reaction, such as:  Trouble breathing.  Wheezing.  Swelling of the lips, tongue, or face.  Fainting.  Blisters on the skin or in the mouth. If you have signs of a very bad allergic reaction, stop taking the antibiotic medicine right away.   This information is not intended to replace advice given to you by your health care provider. Make sure you discuss any questions you have with your health care provider.   Document Released: 02/24/2008 Document Revised: 02/05/2015 Document Reviewed: 10/02/2014 Elsevier Interactive Patient Education 2016 Elsevier Inc.  Potassium Content of Foods Potassium is a mineral found in many foods and drinks. It helps keep fluids and minerals balanced in your body and affects how steadily your heart beats.  Potassium also helps control your blood pressure and keep your muscles and nervous system healthy. Certain health conditions and medicines may change the balance of potassium in your body. When this happens, you can help balance your level of potassium through the foods that you do or do not eat. Your health care provider or dietitian may recommend an amount of potassium that you should have each day. The following lists of foods provide the amount of potassium (in parentheses) per serving in each item. HIGH IN POTASSIUM  The following foods and beverages have 200 mg or more of potassium per  serving:  Apricots, 2 raw or 5 dry (200 mg).  Artichoke, 1 medium (345 mg).  Avocado, raw,  each (245 mg).  Banana, 1 medium (425 mg).  Beans, lima, or baked beans, canned,  cup (280 mg).  Beans, white, canned,  cup (595 mg).  Beef roast, 3 oz (320 mg).  Beef, ground, 3 oz (270 mg).  Beets, raw or cooked,  cup (260 mg).  Bran muffin, 2 oz (300 mg).  Broccoli,  cup (230 mg).  Brussels sprouts,  cup (250 mg).  Cantaloupe,  cup (215 mg).  Cereal, 100% bran,  cup (200-400 mg).  Cheeseburger, single, fast food, 1 each (225-400 mg).  Chicken, 3 oz (220 mg).  Clams, canned, 3 oz (535 mg).  Crab, 3 oz (225 mg).  Dates, 5 each (270 mg).  Dried beans and peas,  cup (300-475 mg).  Figs, dried, 2 each (260 mg).  Fish: halibut, tuna, cod, snapper, 3 oz (480 mg).  Fish: salmon, haddock, swordfish, perch, 3 oz (300 mg).  Fish, tuna, canned 3 oz (200 mg).  [place] fries, fast food, 3 oz (470 mg).  Granola with fruit and nuts,  cup (200 mg).  Grapefruit juice,  cup (200 mg).  Greens, beet,  cup (655 mg).  Honeydew melon,  cup (200 mg).  Kale, raw, 1 cup (300 mg).  Kiwi, 1 medium (240 mg).  Kohlrabi, rutabaga, parsnips,  cup (280 mg).  Lentils,  cup (365 mg).  Mango, 1 each (325 mg).  Milk, chocolate, 1 cup (420 mg).  Milk: nonfat, low-fat, whole, buttermilk, 1 cup (350-380 mg).  Molasses, 1 Tbsp (295 mg).  Mushrooms,  cup (280) mg.  Nectarine, 1 each (275 mg).  Nuts: almonds, peanuts, hazelnuts, [place], cashew, mixed, 1 oz (200 mg).  Nuts, pistachios, 1 oz (295 mg).  Orange, 1 each (240 mg).  Orange juice,  cup (235 mg).  Papaya, medium,  fruit (390 mg).  Peanut butter, chunky, 2 Tbsp (240 mg).  Peanut butter, smooth, 2 Tbsp (210 mg).  Pear, 1 medium (200 mg).  Pomegranate, 1 whole (400 mg).  Pomegranate juice,  cup (215 mg).  Pork, 3 oz (350 mg).  Potato chips, salted, 1 oz (465 mg).  Potato, baked with skin, 1  medium (925 mg).  Potatoes, boiled,  cup (255 mg).  Potatoes, mashed,  cup (330 mg).  Prune juice,  cup (370 mg).  Prunes, 5 each (305 mg).  Pudding, chocolate,  cup (230 mg).  Pumpkin, canned,  cup (250 mg).  Raisins, seedless,  cup (270 mg).  Seeds, sunflower or pumpkin, 1 oz (240 mg).  Soy milk, 1 cup (300 mg).  Spinach,  cup (420 mg).  Spinach, canned,  cup (370 mg).  Sweet potato, baked with skin, 1 medium (450 mg).  Swiss chard,  cup (480 mg).  Tomato or vegetable juice,  cup (275 mg).  Tomato sauce or puree,  cup (400-550 mg).  Tomato, raw, 1 medium (290 mg).  Tomatoes, canned,  cup (200-300 mg).  [place], 3 oz (250 mg).  Wheat germ, 1 oz (250 mg).  Winter squash,  cup (250 mg).  Yogurt, plain or fruited, 6 oz (260-435 mg).  Zucchini,  cup (220 mg). MODERATE IN POTASSIUM The following foods and beverages have 50-200 mg of potassium per serving:  Apple, 1 each (150 mg).  Apple juice,  cup (150 mg).  Applesauce,  cup (90 mg).  Apricot nectar,  cup (140 mg).  Asparagus, small spears,  cup or 6 spears (155 mg).  Bagel, cinnamon raisin, 1 each (130 mg).  Bagel, egg or plain, 4 in., 1 each (70 mg).  Beans, green,  cup (90 mg).  Beans, yellow,  cup (190 mg).  Beer, regular, 12 oz (100 mg).  Beets, canned,  cup (125 mg).  Blackberries,  cup (115 mg).  Blueberries,  cup (60 mg).  Bread, whole wheat, 1 slice (70 mg).  Broccoli, raw,  cup (145 mg).  Cabbage,  cup (150 mg).  Carrots, cooked or raw,  cup (180 mg).  Cauliflower, raw,  cup (150 mg).  Celery, raw,  cup (155 mg).  Cereal, bran flakes, cup (120-150 mg).  Cheese, cottage,  cup (110 mg).  Cherries, 10 each (150 mg).  Chocolate, 1 oz bar (165 mg).  Coffee, brewed 6 oz (90 mg).  Corn,  cup or 1 ear (195 mg).  Cucumbers,  cup (80 mg).  Egg, large, 1 each (60 mg).  Eggplant,  cup (60 mg).  Endive, raw, cup (80 mg).  English  muffin, 1 each (65 mg).  Fish, orange roughy, 3 oz (150 mg).  Frankfurter, beef or pork, 1 each (75 mg).  Fruit cocktail,  cup (115 mg).  Grape juice,  cup (170 mg).  Grapefruit,  fruit (175 mg).  Grapes,  cup (155 mg).  Greens: kale, turnip, collard,  cup (110-150 mg).  Ice cream or frozen yogurt, chocolate,  cup (175 mg).  Ice cream or frozen yogurt, vanilla,  cup (120-150 mg).  Lemons, limes, 1 each (80 mg).  Lettuce, all types, 1 cup (100 mg).  Mixed vegetables,  cup (150 mg).  Mushrooms, raw,  cup (110 mg).  Nuts: walnuts, pecans, or macadamia, 1 oz (125 mg).  Oatmeal,  cup (80 mg).  Okra,  cup (110 mg).  Onions, raw,  cup (120 mg).  Peach, 1 each (185 mg).  Peaches, canned,  cup (120 mg).  Pears, canned,  cup (120 mg).  Peas, green, frozen,  cup (90 mg).  Peppers, green,  cup (130 mg).  Peppers, red,  cup (160 mg).  Pineapple juice,  cup (165 mg).  Pineapple, fresh or canned,  cup (100 mg).  Plums, 1 each (105 mg).  Pudding, vanilla,  cup (150 mg).  Raspberries,  cup (90 mg).  Rhubarb,  cup (115 mg).  Rice, wild,  cup (80 mg).  Shrimp, 3 oz (155 mg).  Spinach, raw, 1 cup (170 mg).  Strawberries,  cup (125 mg).  Summer squash  cup (175-200 mg).  Swiss chard, raw, 1 cup (135 mg).  Tangerines, 1 each (140 mg).  Tea, brewed, 6 oz (65 mg).  Turnips,  cup (140 mg).  Watermelon,  cup (85 mg).  Wine, red, table, 5 oz (180 mg).  Wine, white, table, 5 oz (100 mg). LOW IN POTASSIUM The following foods and beverages have less than 50 mg of potassium per serving.  Bread, white,  1 slice (30 mg).  Carbonated beverages, 12 oz (less than 5 mg).  Cheese, 1 oz (20-30 mg).  Cranberries,  cup (45 mg).  Cranberry juice cocktail,  cup (20 mg).  Fats and oils, 1 Tbsp (less than 5 mg).  Hummus, 1 Tbsp (32 mg).  Nectar: papaya, mango, or pear,  cup (35 mg).  Rice, white or brown,  cup (50  mg).  Spaghetti or macaroni,  cup cooked (30 mg).  Tortilla, flour or corn, 1 each (50 mg).  Waffle, 4 in., 1 each (50 mg).  Water chestnuts,  cup (40 mg).   This information is not intended to replace advice given to you by your health care provider. Make sure you discuss any questions you have with your health care provider.   Document Released: 12/29/2004 Document Revised: 05/22/2013 Document Reviewed: 04/13/2013 Elsevier Interactive Patient Education 2016 [employer].  Hypokalemia Hypokalemia means that the amount of potassium in the blood is lower than normal.Potassium is a chemical, called an electrolyte, that helps regulate the amount of fluid in the body. It also stimulates muscle contraction and helps nerves function properly.Most of the body's potassium is inside of cells, and only a very small amount is in the blood. Because the amount in the blood is so small, minor changes can be life-threatening. CAUSES  Antibiotics.  Diarrhea or vomiting.  Using laxatives too much, which can cause diarrhea.  Chronic kidney disease.  Water pills (diuretics).  Eating disorders (bulimia).  Low magnesium level.  Sweating a lot. SIGNS AND SYMPTOMS  Weakness.  Constipation.  Fatigue.  Muscle cramps.  Mental confusion.  Skipped heartbeats or irregular heartbeat (palpitations).  Tingling or numbness. DIAGNOSIS  Your health care provider can diagnose hypokalemia with blood tests. In addition to checking your potassium level, your health care provider may also check other lab tests. TREATMENT Hypokalemia can be treated with potassium supplements taken by mouth or adjustments in your current medicines. If your potassium level is very low, you may need to get potassium through a vein (IV) and be monitored in the hospital. A diet high in potassium is also helpful. Foods high in potassium are:  Nuts, such as peanuts and pistachios.  Seeds, such as sunflower seeds and  pumpkin seeds.  Peas, lentils, and lima beans.  Whole grain and bran cereals and breads.  Fresh fruit and vegetables, such as apricots, avocado, bananas, cantaloupe, kiwi, oranges, tomatoes, asparagus, and potatoes.  Orange and tomato juices.  Red meats.  Fruit yogurt. HOME CARE INSTRUCTIONS  Take all medicines as prescribed by your health care provider.  Maintain a healthy diet by including nutritious food, such as fruits, vegetables, nuts, whole grains, and lean meats.  If you are taking a laxative, be sure to follow the directions on the label. SEEK MEDICAL CARE IF:  Your weakness gets worse.  You feel your heart pounding or racing.  You are vomiting or having diarrhea.  You are diabetic and having trouble keeping your blood glucose in the normal range. SEEK IMMEDIATE MEDICAL CARE IF:  You have chest pain, shortness of breath, or dizziness.  You are vomiting or having diarrhea for more than 2 days.  You faint. MAKE SURE YOU:   Understand these instructions.  Will watch your condition.  Will get help right away if you are not doing well or get worse.   This information is not intended to replace advice given to you by your health care provider. Make sure you discuss any questions you have  with your health care provider.   Document Released: 05/17/2005 Document Revised: 06/07/2014 Document Reviewed: 11/17/2012 Elsevier Interactive Patient Education 2016 Elsevier Inc.  Dysuria Dysuria is pain or discomfort while urinating. The pain or discomfort may be felt in the tube that carries urine out of the bladder (urethra) or in the surrounding tissue of the genitals. The pain may also be felt in the groin area, lower abdomen, and lower back. You may have to urinate frequently or have the sudden feeling that you have to urinate (urgency). Dysuria can affect both men and women, but is more common in women. Dysuria can be caused by many different things,  including:  Urinary tract infection in women.  Infection of the kidney or bladder.  Kidney stones or bladder stones.  Certain sexually transmitted infections (STIs), such as chlamydia.  Dehydration.  Inflammation of the vagina.  Use of certain medicines.  Use of certain soaps or scented products that cause irritation. HOME CARE INSTRUCTIONS Watch your dysuria for any changes. The following actions may help to reduce any discomfort you are feeling:  Drink enough fluid to keep your urine clear or pale yellow.  Empty your bladder often. Avoid holding urine for long periods of time.  After a bowel movement or urination, women should cleanse from front to back, using each tissue only once.  Empty your bladder after sexual intercourse.  Take medicines only as directed by your health care provider.  If you were prescribed an antibiotic medicine, finish it all even if you start to feel better.  Avoid caffeine, tea, and alcohol. They can irritate the bladder and make dysuria worse. In men, alcohol may irritate the prostate.  Keep all follow-up visits as directed by your health care provider. This is important.  If you had any tests done to find the cause of dysuria, it is your responsibility to obtain your test results. Ask the lab or department performing the test when and how you will get your results. Talk with your health care provider if you have any questions about your results. SEEK MEDICAL CARE IF:  You develop pain in your back or sides.  You have a fever.  You have nausea or vomiting.  You have blood in your urine.  You are not urinating as often as you usually do. SEEK IMMEDIATE MEDICAL CARE IF:  You pain is severe and not relieved with medicines.  You are unable to hold down any fluids.  You or someone else notices a change in your mental function.  You have a rapid heartbeat at rest.  You have shaking or chills.  You feel extremely weak.   This  information is not intended to replace advice given to you by your health care provider. Make sure you discuss any questions you have with your health care provider.   Document Released: 02/13/2004 Document Revised: 06/07/2014 Document Reviewed: 01/10/2014 Elsevier Interactive Patient Education 2016 Elsevier Inc.   Urinary Tract Infection A urinary tract infection (UTI) can occur any place along the urinary tract. The tract includes the kidneys, ureters, bladder, and urethra. A type of germ called bacteria often causes a UTI. UTIs are often helped with antibiotic medicine.  HOME CARE   If given, take antibiotics as told by your doctor. Finish them even if you start to feel better.  Drink enough fluids to keep your pee (urine) clear or pale yellow.  Avoid tea, drinks with caffeine, and bubbly (carbonated) drinks.  Pee often. Avoid holding your pee in for  a long time.  Pee before and after having sex (intercourse).  Wipe from front to back after you poop (bowel movement) if you are a woman. Use each tissue only once. GET HELP RIGHT AWAY IF:   You have back pain.  You have lower belly (abdominal) pain.  You have chills.  You feel sick to your stomach (nauseous).  You throw up (vomit).  Your burning or discomfort with peeing does not go away.  You have a fever.  Your symptoms are not better in 3 days. MAKE SURE YOU:   Understand these instructions.  Will watch your condition.  Will get help right away if you are not doing well or get worse.   This information is not intended to replace advice given to you by your health care provider. Make sure you discuss any questions you have with your health care provider.   Document Released: 11/03/2007 Document Revised: 06/07/2014 Document Reviewed: 12/16/2011 Elsevier Interactive Patient Education 2016 [employer].  Fall Prevention in the Home  Falls can cause injuries. They can happen to people of all ages. There are many  things you can do to make your home safe and to help prevent falls.  WHAT CAN I DO ON THE OUTSIDE OF MY HOME?  Regularly fix the edges of walkways and driveways and fix any cracks.  Remove anything that might make you trip as you walk through a door, such as a raised step or threshold.  Trim any bushes or trees on the path to your home.  Use bright outdoor lighting.  Clear any walking paths of anything that might make someone trip, such as rocks or tools.  Regularly check to see if handrails are loose or broken. Make sure that both sides of any steps have handrails.  Any raised decks and porches should have guardrails on the edges.  Have any leaves, snow, or ice cleared regularly.  Use sand or salt on walking paths during winter.  Clean up any spills in your garage right away. This includes oil or grease spills. WHAT CAN I DO IN THE BATHROOM?   Use night lights.  Install grab bars by the toilet and in the tub and shower. Do not use towel bars as grab bars.  Use non-skid mats or decals in the tub or shower.  If you need to sit down in the shower, use a plastic, non-slip stool.  Keep the floor dry. Clean up any water that spills on the floor as soon as it happens.  Remove soap buildup in the tub or shower regularly.  Attach bath mats securely with double-sided non-slip rug tape.  Do not have throw rugs and other things on the floor that can make you trip. WHAT CAN I DO IN THE BEDROOM?  Use night lights.  Make sure that you have a light by your bed that is easy to reach.  Do not use any sheets or blankets that are too big for your bed. They should not hang down onto the floor.  Have a firm chair that has side arms. You can use this for support while you get dressed.  Do not have throw rugs and other things on the floor that can make you trip. WHAT CAN I DO IN THE KITCHEN?  Clean up any spills right away.  Avoid walking on wet floors.  Keep items that you use a lot  in easy-to-reach places.  If you need to reach something above you, use a strong step stool  that has a grab bar.  Keep electrical cords out of the way.  Do not use floor polish or wax that makes floors slippery. If you must use wax, use non-skid floor wax.  Do not have throw rugs and other things on the floor that can make you trip. WHAT CAN I DO WITH MY STAIRS?  Do not leave any items on the stairs.  Make sure that there are handrails on both sides of the stairs and use them. Fix handrails that are broken or loose. Make sure that handrails are as long as the stairways.  Check any carpeting to make sure that it is firmly attached to the stairs. Fix any carpet that is loose or worn.  Avoid having throw rugs at the top or bottom of the stairs. If you do have throw rugs, attach them to the floor with carpet tape.  Make sure that you have a light switch at the top of the stairs and the bottom of the stairs. If you do not have them, ask someone to add them for you. WHAT ELSE CAN I DO TO HELP PREVENT FALLS?  Wear shoes that:  Do not have high heels.  Have rubber bottoms.  Are comfortable and fit you well.  Are closed at the toe. Do not wear sandals.  If you use a stepladder:  Make sure that it is fully opened. Do not climb a closed stepladder.  Make sure that both sides of the stepladder are locked into place.  Ask someone to hold it for you, if possible.  Clearly mark and make sure that you can see:  Any grab bars or handrails.  First and last steps.  Where the edge of each step is.  Use tools that help you move around (mobility aids) if they are needed. These include:  Canes.  Walkers.  Scooters.  Crutches.  Turn on the lights when you go into a dark area. Replace any light bulbs as soon as they burn out.  Set up your furniture so you have a clear path. Avoid moving your furniture around.  If any of your floors are uneven, fix them.  If there are any pets  around you, be aware of where they are.  Review your medicines with your doctor. Some medicines can make you feel dizzy. This can increase your chance of falling. Ask your doctor what other things that you can do to help prevent falls.   This information is not intended to replace advice given to you by your health care provider. Make sure you discuss any questions you have with your health care provider.   Document Released: 03/13/2009 Document Revised: 10/01/2014 Document Reviewed: 06/21/2014 Elsevier Interactive Patient Education [employer]

## 2016-01-05 ENCOUNTER — Telehealth: Payer: Self-pay

## 2016-01-05 LAB — FOLATE RBC
FOLATE, RBC: 1269 ng/mL (ref >498)
Folate, Hemolysate: 520.1 ng/mL
Hematocrit: 41 % (ref 34.0–46.6)

## 2016-01-05 NOTE — Telephone Encounter (Signed)
Transition Care Management Follow-up Telephone Call     Date discharged? 01/03/2016  How have you been since you were released from the hospital? Pt is recovering but is concerned about medications.    Do you understand why you were in the hospital? Yes   Do you understand the discharge instructions? Yes   Where were you discharged to? Home   Items Reviewed:  Medications reviewed: Yes  Allergies reviewed: Yes   Dietary changes reviewed: No  Referrals reviewed: No   Functional Questionnaire:    Activities of Daily Living (ADLs):  Independent with all ADLs  Personal hygiene  Dressing Eating  Maintaining continence Transferring  Independent Activities of Daily Living (iADLs): Independent with all iADLs  Basic communication skills Transportation Meal preparation  Shopping Housework  Managing medications Managing personal finances   Confirmed importance and date/time of follow-up visits scheduled YES  Provider Appointment booked 01/06/16 @ 1000  Confirmed with patient if condition begins to worsen call PCP or go to the ER.  Patient was given the office number and encouraged to call back with question or concerns: YES

## 2016-01-06 ENCOUNTER — Ambulatory Visit (INDEPENDENT_AMBULATORY_CARE_PROVIDER_SITE_OTHER): Payer: PPO | Admitting: Family Medicine

## 2016-01-06 ENCOUNTER — Encounter: Payer: Self-pay | Admitting: Family Medicine

## 2016-01-06 VITALS — BP 120/65 | HR 79 | Temp 97.9°F | Ht 59.25 in | Wt 133.2 lb

## 2016-01-06 DIAGNOSIS — E876 Hypokalemia: Secondary | ICD-10-CM | POA: Diagnosis not present

## 2016-01-06 DIAGNOSIS — E871 Hypo-osmolality and hyponatremia: Secondary | ICD-10-CM | POA: Diagnosis not present

## 2016-01-06 DIAGNOSIS — Z5189 Encounter for other specified aftercare: Secondary | ICD-10-CM | POA: Diagnosis not present

## 2016-01-06 DIAGNOSIS — W19XXXS Unspecified fall, sequela: Secondary | ICD-10-CM

## 2016-01-06 DIAGNOSIS — N390 Urinary tract infection, site not specified: Secondary | ICD-10-CM

## 2016-01-06 DIAGNOSIS — G9340 Encephalopathy, unspecified: Secondary | ICD-10-CM | POA: Diagnosis not present

## 2016-01-06 DIAGNOSIS — I10 Essential (primary) hypertension: Secondary | ICD-10-CM | POA: Diagnosis not present

## 2016-01-06 LAB — BASIC METABOLIC PANEL
BUN: 18 mg/dL (ref 6–23)
CHLORIDE: 100 meq/L (ref 96–112)
CO2: 30 mEq/L (ref 19–32)
CREATININE: 0.94 mg/dL (ref 0.40–1.20)
Calcium: 9.6 mg/dL (ref 8.4–10.5)
GFR: 60.47 mL/min (ref >60.00)
Glucose, Bld: 111 mg/dL — ABNORMAL HIGH (ref 70–99)
POTASSIUM: 4.1 meq/L (ref 3.5–5.1)
Sodium: 137 mEq/L (ref 135–145)

## 2016-01-06 NOTE — Assessment & Plan Note (Signed)
Now off hctz Re check today  Was corrected in hospital

## 2016-01-06 NOTE — Assessment & Plan Note (Signed)
Off hctz now  Replaced in hospital Re check today

## 2016-01-06 NOTE — Progress Notes (Signed)
Pre visit review using our clinic review tool, if applicable. No additional management support is needed unless otherwise documented below in the visit note. 

## 2016-01-06 NOTE — Progress Notes (Signed)
Subjective:    Patient ID: Amber Rollins, female    DOB: [birth year], 82 y.o.   MRN: [id number]  HPI Here for f/u of hospitalization   Hospitalized from 8/3-01/03/16  Larey Seat at home and lay on the floor for prolonged period (trying to get back up into bed) Found hypokalemic and hyponatremic Mild rhabdomyalisis  NA of 129 and K of 2.8 CK of 696 ua was pos for uti  Suffered from acute encephalopathy due to lab abn , and CT head was neg   High dose hctz was d/c due to low na and K  tx for uti but urine cx was inconclusive -tx with cipro (has multiple med intolerances)  Hip xray ok   PT eval done- and SNF was recommended but pt refused Home health/PT is coming in   HTN BP Readings from Last 3 Encounters:  01/06/16 (!) 134/56  01/03/16 (!) 159/64  11/10/15 134/70   re check today BP: 120/65    Results for orders placed or performed during the hospital encounter of 01/01/16  Urine culture  Result Value Ref Range   Specimen Description URINE, RANDOM    Special Requests NONE    Culture MULTIPLE SPECIES PRESENT, SUGGEST RECOLLECTION (A)    Report Status 01/03/2016 FINAL   CBC with Differential/Platelet  Result Value Ref Range   WBC 10.0 4.0 - 10.5 K/uL   RBC 4.67 3.87 - 5.11 MIL/uL   Hemoglobin 14.5 12.0 - 15.0 g/dL   HCT 40.9 81.1 - 91.4 %   MCV 87.8 78.0 - 100.0 fL   MCH 31.0 26.0 - 34.0 pg   MCHC 35.4 30.0 - 36.0 g/dL   RDW 78.2 95.6 - 21.3 %   Platelets 307 150 - 400 K/uL   Neutrophils Relative % 59 %   Neutro Abs 5.9 1.7 - 7.7 K/uL   Lymphocytes Relative 30 %   Lymphs Abs 3.0 0.7 - 4.0 K/uL   Monocytes Relative 9 %   Monocytes Absolute 0.9 0.1 - 1.0 K/uL   Eosinophils Relative 2 %   Eosinophils Absolute 0.2 0.0 - 0.7 K/uL   Basophils Relative 0 %   Basophils Absolute 0.0 0.0 - 0.1 K/uL  Comprehensive metabolic panel  Result Value Ref Range   Sodium 129 (L) 135 - 145 mmol/L   Potassium 2.8 (L) 3.5 - 5.1 mmol/L   Chloride 88 (L) 101 - 111 mmol/L   CO2  31 22 - 32 mmol/L   Glucose, Bld 98 65 - 99 mg/dL   BUN 19 6 - 20 mg/dL   Creatinine, Ser 0.86 0.44 - 1.00 mg/dL   Calcium 9.4 8.9 - 57.8 mg/dL   Total Protein 7.2 6.5 - 8.1 g/dL   Albumin 4.4 3.5 - 5.0 g/dL   AST 35 15 - 41 U/L   ALT 19 14 - 54 U/L   Alkaline Phosphatase 60 38 - 126 U/L   Total Bilirubin 1.0 0.3 - 1.2 mg/dL   GFR calc non Af Amer 56 (L) >60 mL/min   GFR calc Af Amer >60 >60 mL/min   Anion gap 10 5 - 15  Urinalysis, Routine w reflex microscopic (not at [facility])  Result Value Ref Range   Color, Urine YELLOW YELLOW   APPearance CLEAR CLEAR   Specific Gravity, Urine 1.004 (L) 1.005 - 1.030   pH 7.0 5.0 - 8.0   Glucose, UA NEGATIVE NEGATIVE mg/dL   Hgb urine dipstick SMALL (A) NEGATIVE   Bilirubin Urine NEGATIVE NEGATIVE  Ketones, ur NEGATIVE NEGATIVE mg/dL   Protein, ur NEGATIVE NEGATIVE mg/dL   Nitrite NEGATIVE NEGATIVE   Leukocytes, UA MODERATE (A) NEGATIVE  CK  Result Value Ref Range   Total CK 696 (H) 38 - 234 U/L  Urine microscopic-add on  Result Value Ref Range   Squamous Epithelial / LPF 0-5 (A) NONE SEEN   WBC, UA 6-30 0 - 5 WBC/hpf   RBC / HPF 0-5 0 - 5 RBC/hpf   Bacteria, UA FEW (A) NONE SEEN  Magnesium  Result Value Ref Range   Magnesium 1.5 (L) 1.7 - 2.4 mg/dL  Basic metabolic panel  Result Value Ref Range   Sodium 134 (L) 135 - 145 mmol/L   Potassium 3.3 (L) 3.5 - 5.1 mmol/L   Chloride 96 (L) 101 - 111 mmol/L   CO2 28 22 - 32 mmol/L   Glucose, Bld 99 65 - 99 mg/dL   BUN 12 6 - 20 mg/dL   Creatinine, Ser 1.61 0.44 - 1.00 mg/dL   Calcium 9.3 8.9 - 09.6 mg/dL   GFR calc non Af Amer >60 >60 mL/min   GFR calc Af Amer >60 >60 mL/min   Anion gap 10 5 - 15  Ammonia  Result Value Ref Range   Ammonia 16 9 - 35 umol/L  RPR  Result Value Ref Range   RPR Ser Ql Non Reactive Non Reactive  Vitamin B12  Result Value Ref Range   Vitamin B-12 414 180 - 914 pg/mL  Folate RBC  Result Value Ref Range   Folate, Hemolysate 520.1 Not Estab. ng/mL    Hematocrit 41.0 34.0 - 46.6 %   Folate, RBC 1,269 >498 ng/mL  TSH  Result Value Ref Range   TSH 3.592 0.350 - 4.500 uIU/mL  HIV antibody (routine testing) (NOT for [facility])  Result Value Ref Range   HIV Screen 4th Generation wRfx Non Reactive Non Reactive  Glucose, capillary  Result Value Ref Range   Glucose-Capillary 107 (H) 65 - 99 mg/dL   Comment 1 Notify RN    Comment 2 Document in Chart   CK  Result Value Ref Range   Total CK 561 (H) 38 - 234 U/L  Comprehensive metabolic panel  Result Value Ref Range   Sodium 134 (L) 135 - 145 mmol/L   Potassium 3.8 3.5 - 5.1 mmol/L   Chloride 100 (L) 101 - 111 mmol/L   CO2 27 22 - 32 mmol/L   Glucose, Bld 106 (H) 65 - 99 mg/dL   BUN 15 6 - 20 mg/dL   Creatinine, Ser 0.45 0.44 - 1.00 mg/dL   Calcium 8.7 (L) 8.9 - 10.3 mg/dL   Total Protein 6.2 (L) 6.5 - 8.1 g/dL   Albumin 3.7 3.5 - 5.0 g/dL   AST 26 15 - 41 U/L   ALT 16 14 - 54 U/L   Alkaline Phosphatase 53 38 - 126 U/L   Total Bilirubin 0.6 0.3 - 1.2 mg/dL   GFR calc non Af Amer 53 (L) >60 mL/min   GFR calc Af Amer >60 >60 mL/min   Anion gap 7 5 - 15  CK  Result Value Ref Range   Total CK 225 38 - 234 U/L  Magnesium  Result Value Ref Range   Magnesium 2.0 1.7 - 2.4 mg/dL  Glucose, capillary  Result Value Ref Range   Glucose-Capillary 103 (H) 65 - 99 mg/dL  I-stat troponin, ED  Result Value Ref Range   Troponin i, poc 0.02 0.00 -  0.08 ng/mL   Comment 3            Improvement of labs at d/c  Ct Head Wo Contrast  Result Date: 01/01/2016 CLINICAL DATA:  82 year old female with altered mental status, reportedly fell last night. Initial encounter. EXAM: CT HEAD WITHOUT CONTRAST CT CERVICAL SPINE WITHOUT CONTRAST TECHNIQUE: Multidetector CT imaging of the head and cervical spine was performed following the standard protocol without intravenous contrast. Multiplanar CT image reconstructions of the cervical spine were also generated. COMPARISON:  Cervical spine CT 08/08/2012 FINDINGS: CT  HEAD FINDINGS Visualized paranasal sinuses and mastoids are well pneumatized. No scalp hematoma identified. Negative orbits soft tissues. Calvarium intact. Calcified atherosclerosis at the skull base. Chronic lacunar infarct left basal ganglia. Associated patchy bilateral cerebral white matter hypodensity. No midline shift, mass effect, or evidence of intracranial mass lesion. No cortically based acute infarct identified. No acute intracranial hemorrhage identified. CT CERVICAL SPINE FINDINGS Motion artifact below the C5 level. Significant loss of bone detail at C6. Cervicothoracic junction alignment appears maintained. Visualized skull base is intact. No atlanto-occipital dissociation. No definite cervical spine fracture. Visualized upper thoracic levels appear stable. Negative noncontrast paraspinal soft tissues aside from calcified carotid artery atherosclerosis. Negative lung apices. IMPRESSION: 1. No acute intracranial abnormality. Chronic small vessel ischemia most pronounced in the left basal ganglia. 2. C6 cervical spine level degraded by motion. No fracture identified in the cervical spine. Ligamentous injury is not excluded. Electronically Signed   By: Odessa Fleming M.D.   On: 01/01/2016 19:21   Ct Cervical Spine Wo Contrast  Result Date: 01/01/2016 CLINICAL DATA:  82 year old female with altered mental status, reportedly fell last night. Initial encounter. EXAM: CT HEAD WITHOUT CONTRAST CT CERVICAL SPINE WITHOUT CONTRAST TECHNIQUE: Multidetector CT imaging of the head and cervical spine was performed following the standard protocol without intravenous contrast. Multiplanar CT image reconstructions of the cervical spine were also generated. COMPARISON:  Cervical spine CT 08/08/2012 FINDINGS: CT HEAD FINDINGS Visualized paranasal sinuses and mastoids are well pneumatized. No scalp hematoma identified. Negative orbits soft tissues. Calvarium intact. Calcified atherosclerosis at the skull base. Chronic lacunar  infarct left basal ganglia. Associated patchy bilateral cerebral white matter hypodensity. No midline shift, mass effect, or evidence of intracranial mass lesion. No cortically based acute infarct identified. No acute intracranial hemorrhage identified. CT CERVICAL SPINE FINDINGS Motion artifact below the C5 level. Significant loss of bone detail at C6. Cervicothoracic junction alignment appears maintained. Visualized skull base is intact. No atlanto-occipital dissociation. No definite cervical spine fracture. Visualized upper thoracic levels appear stable. Negative noncontrast paraspinal soft tissues aside from calcified carotid artery atherosclerosis. Negative lung apices. IMPRESSION: 1. No acute intracranial abnormality. Chronic small vessel ischemia most pronounced in the left basal ganglia. 2. C6 cervical spine level degraded by motion. No fracture identified in the cervical spine. Ligamentous injury is not excluded. Electronically Signed   By: Odessa Fleming M.D.   On: 01/01/2016 19:21   Dg Hip Unilat W Or Wo Pelvis 2-3 Views Left  Result Date: 01/01/2016 CLINICAL DATA:  Left hip pain status post fall. EXAM: DG HIP (WITH OR WITHOUT PELVIS) 2-3V LEFT COMPARISON:  None. FINDINGS: There is no evidence of hip fracture or dislocation. There is no evidence of arthropathy or other focal bone abnormality. Soft tissue bruising is seen overlying the greater trochanter. IMPRESSION: No acute fracture or dislocation identified about the left hip or pelvis. Electronically Signed   By: Ted Mcalpine M.D.   On: 01/01/2016 18:46  Pt states she feels much better Still a little tired  She states home care will come out tomorrow  Admits balance is not great   She fell when she had spilled water walking with a drink and slipped  Has a medic alert button - did not push it    Patient Active Problem List   Diagnosis Date Noted  . Hyponatremia 01/01/2016  . Routine general medical examination at a health care facility  11/10/2015  . Cellulitis 10/28/2015  . Urinary frequency 10/23/2014  . Fatigue 10/23/2014  . Hammer toe 09/20/2014  . Herpetic dermatitis 08/09/2014  . Left flank pain 07/01/2014  . Candidal intertrigo 02/11/2014  . Dizziness 04/11/2013  . Thoracic back pain 04/11/2013  . Urinary incontinence 12/27/2012  . Depression 09/24/2012  . Thoracic kyphosis 09/24/2012  . UTI (lower urinary tract infection) 08/12/2012  . Acute blood loss anemia 08/10/2012  . Fall 08/09/2012  . Laceration of lower leg 08/09/2012  . Hemorrhoid 12/24/2011  . Low back pain 12/24/2011  . Hypokalemia 10/06/2010  . HEMATURIA UNSPECIFIED 10/02/2009  . NIGHT SWEATS 10/02/2009  . GERD 06/18/2009  . INCONTINENCE, URGE 06/18/2009  . FATIGUE 03/12/2009  . HYPOKALEMIA 10/08/2008  . MENOPAUSAL SYNDROME 01/22/2008  . EDEMA 07/19/2007  . ANXIETY STATE, UNSPECIFIED 06/21/2007  . REACTION, ACUTE STRESS W/EMOTIONAL DSTURB 03/06/2007  . OVARIAN MASS 03/06/2007  . FOOT PAIN, CHRONIC 03/06/2007  . ALLERGIC RHINITIS, SEASONAL 09/12/2006  . IRRITABLE BOWEL SYNDROME 09/12/2006  . FIBROCYSTIC BREAST DISEASE 09/12/2006  . OSTEOARTHRITIS 09/12/2006  . HEMORRHOIDS, HX OF 09/12/2006  . HYSTERECTOMY, HX OF 09/12/2006  . Essential hypertension 05/31/2000  . Disorder of bone and cartilage 11/29/1999  . Hyperlipidemia 05/31/1992   Past Medical History:  Diagnosis Date  . Chronic foot pain    mortons neuroma  . Complication of anesthesia   . Cystocele 01/01   neg. sx  . fracture fibula ? 2003   left  . GERD (gastroesophageal reflux disease)   . Hemorrhoids   . Hyperlipemia 05/31/1992  . Hypertension 05/31/2000  . Menopausal symptoms    vasomotor symptoms-severe  . Osteoarthritis   . Osteopenia 11/29/1999  . Pedal edema    worsened by Norvasc  . Plantar fasciitis   . PONV (postoperative nausea and vomiting)    Past Surgical History:  Procedure Laterality Date  . Anterior repari w//surg proc  06/30/99   RSO  .  BLADDER SUSPENSION    . BLADDER TACK    . CATARACTS    . Foot problems     neuromas  . history of CT     mass right adnexa, U?S by GYN-observation  . KYPHOPLASTY N/A 09/04/2012   Procedure: T7, T8,T12 Kyphoplasty ;  Surgeon: Clydene Fake, MD;  Location: [facility];  Service: Neurosurgery;  Laterality: N/A;  thoracic seven,eight, and twelve   . PARTIAL HYSTERECTOMY  1970   endometriosis   Social History  Substance Use Topics  . Smoking status: Never Smoker  . Smokeless tobacco: Never Used  . Alcohol use No   Family History  Problem Relation Age of Onset  . Diabetes Brother   . Hypertension Other   . Diabetes Mother   . Heart disease Mother     pacemaker  . Hypertension Mother   . COPD Father   . Diabetes Sister   . Mental illness Son     anxiety-mental health problems  . Diabetes Brother   . Diabetes Sister   . Diabetes Sister  Allergies  Allergen Reactions  . Tdap [Diphth-Acell Pertussis-Tetanus] Swelling  . Amlodipine Besylate     REACTION: edema  . Codeine     headache  . Hydrocodone     hallucinations  . Lovastatin     REACTION: leg pain  . Metronidazole Swelling    And body aches  . Penicillins     Unknown reaction; has tolerated cephalosporins  . Sertraline Hcl     REACTION: caused insomnia  . Simvastatin     REACTION: non tolerant- leg pain  . Latex Rash  . Sulfonamide Derivatives Rash   Current Outpatient Prescriptions on File Prior to Visit  Medication Sig Dispense Refill  . azelastine (ASTELIN) 0.1 % nasal spray PLACE 2 SPRAYS INTO BOTH NOSTRILS 2 TIMES DAILY. 30 mL 5  . Cholecalciferol (VITAMIN D3) 2000 UNITS TABS Take 1 tablet by mouth 2 (two) times daily.    . ciprofloxacin (CIPRO) 250 MG tablet Take 1 tablet (250 mg total) by mouth 2 (two) times daily. 10 tablet 0  . fexofenadine (ALLEGRA) 180 MG tablet Take 180 mg by mouth daily as needed (allergies).    . fluticasone (FLONASE) 50 MCG/ACT nasal spray PLACE 1 TO 2 SPRAYS IN EACH NOSTRIL  DAILY. 16 g 2  . mometasone (ELOCON) 0.1 % cream Apply to affected area in small amount 3 times weekly as needed 45 g 3  . omeprazole (PRILOSEC) 20 MG capsule TAKE 1 CAPSULE BY MOUTH 2 TIMES DAILY. (Patient taking differently: TAKE 1 CAPSULE BY MOUTH ONCE DAILY) 180 capsule 1  . PARoxetine (PAXIL) 10 MG tablet Take 1 tablet (10 mg total) by mouth daily. 30 tablet 3   No current facility-administered medications on file prior to visit.     Review of Systems Review of Systems  Constitutional: Negative for fever, appetite change, fatigue and unexpected weight change.  Eyes: Negative for pain and visual disturbance.  Respiratory: Negative for cough and shortness of breath.   Cardiovascular: Negative for cp or palpitations    Gastrointestinal: Negative for nausea, diarrhea and constipation.  Genitourinary: Negative for urgency and frequency.  Skin: Negative for pallor or rash   Neurological: Negative for weakness, light-headedness, numbness and headaches. pos for poor balance  Hematological: Negative for adenopathy. Does not bruise/bleed easily.  Psychiatric/Behavioral: Negative for dysphoric mood. The patient is not nervous/anxious.         Objective:   Physical Exam  Constitutional: She appears well-developed and well-nourished. No distress.  Frail appearing elderly female  HENT:  Head: Normocephalic and atraumatic.  Mouth/Throat: Oropharynx is clear and moist.  Eyes: Conjunctivae and EOM are normal. Pupils are equal, round, and reactive to light.  Neck: Normal range of motion. Neck supple. No JVD present. Carotid bruit is not present. No thyromegaly present.  Cardiovascular: Normal rate, regular rhythm, normal heart sounds and intact distal pulses.  Exam reveals no gallop.   Pulmonary/Chest: Effort normal and breath sounds normal. No respiratory distress. She has no wheezes. She has no rales.  No crackles  Abdominal: Soft. Bowel sounds are normal. She exhibits no distension, no  abdominal bruit and no mass. There is no tenderness.  Musculoskeletal: She exhibits no edema.  kyphosis  Lymphadenopathy:    She has no cervical adenopathy.  Neurological: She is alert. She has normal reflexes. No cranial nerve deficit. She exhibits normal muscle tone. Coordination normal.  Slow and steady wide based gait today that is not shuffling   Skin: Skin is warm and dry. No rash noted.  Psychiatric: She has a normal mood and affect.  Pleasant Mildly anxious at times           Assessment & Plan:   Problem List Items Addressed This Visit      Cardiovascular and Mediastinum   Essential hypertension - Primary    bp in fair control at this time  BP Readings from Last 1 Encounters:  01/06/16 120/65   No changes needed Disc lifstyle change with low sodium diet and exercise  Labs today for bmp -off hctz  Will continue w/o addition of anything else for now F/u 3 mo for re check        Nervous and Auditory   RESOLVED: Acute encephalopathy    In hospital after a fall with electrolyte abn after a fall /also rhabdo MS is now normal  Lab today        Genitourinary   UTI (lower urinary tract infection)    S/p hosp for fall uc inconclusive Will finish cipro No symptoms        Other   Hyponatremia    Now off hctz Re check today  Was corrected in hospital      Relevant Orders   Basic metabolic panel (Completed)   Hypokalemia    Off hctz now  Replaced in hospital Re check today      Relevant Orders   Basic metabolic panel (Completed)   Fall    With mild rhabdo-recent hospitalization Long disc re: fall prev Start PT tomorrow for strength and balance Will use medi alert button whenever necessary  Will use walker to transport items from room to room -never walk carrying things Info given        Other Visit Diagnoses   None.

## 2016-01-06 NOTE — Assessment & Plan Note (Signed)
With mild rhabdo-recent hospitalization Long disc re: fall prev Start PT tomorrow for strength and balance Will use medi alert button whenever necessary  Will use walker to transport items from room to room -never walk carrying things Info given

## 2016-01-06 NOTE — Assessment & Plan Note (Signed)
In hospital after a fall with electrolyte abn after a fall /also rhabdo MS is now normal  Lab today

## 2016-01-06 NOTE — Patient Instructions (Addendum)
Use your walker-especially at night  Find a way not to walk while holding things- use walker/seat/basket - to hold objects so you have nothing in your hands  Always wear your medic alert button and push it if you need to (that is what it is for)  Do your physical therapy Lab today for sodium and potassium  Blood pressure is great today without medication  Finish your cipro  Follow up with me in approx 2 months to re check blood pressure

## 2016-01-06 NOTE — Assessment & Plan Note (Signed)
bp in fair control at this time  BP Readings from Last 1 Encounters:  01/06/16 120/65   No changes needed Disc lifstyle change with low sodium diet and exercise  Labs today for bmp -off hctz  Will continue w/o addition of anything else for now F/u 3 mo for re check

## 2016-01-06 NOTE — Assessment & Plan Note (Signed)
S/p hosp for fall uc inconclusive Will finish cipro No symptoms

## 2016-01-07 ENCOUNTER — Encounter: Payer: Self-pay | Admitting: *Deleted

## 2016-01-28 ENCOUNTER — Ambulatory Visit (INDEPENDENT_AMBULATORY_CARE_PROVIDER_SITE_OTHER): Payer: PPO | Admitting: Family Medicine

## 2016-01-28 ENCOUNTER — Encounter: Payer: Self-pay | Admitting: Family Medicine

## 2016-01-28 VITALS — BP 108/68 | HR 75 | Temp 97.9°F | Wt 128.8 lb

## 2016-01-28 DIAGNOSIS — R350 Frequency of micturition: Secondary | ICD-10-CM | POA: Diagnosis not present

## 2016-01-28 DIAGNOSIS — N3090 Cystitis, unspecified without hematuria: Secondary | ICD-10-CM

## 2016-01-28 DIAGNOSIS — R110 Nausea: Secondary | ICD-10-CM | POA: Diagnosis not present

## 2016-01-28 LAB — POC URINALSYSI DIPSTICK (AUTOMATED)
BILIRUBIN UA: NEGATIVE
GLUCOSE UA: NEGATIVE
KETONES UA: NEGATIVE
Nitrite, UA: NEGATIVE
Protein, UA: NEGATIVE
Spec Grav, UA: 1.02
Urobilinogen, UA: NEGATIVE
pH, UA: 6

## 2016-01-28 LAB — BASIC METABOLIC PANEL
BUN: 20 mg/dL (ref 6–23)
CHLORIDE: 100 meq/L (ref 96–112)
CO2: 33 mEq/L — ABNORMAL HIGH (ref 19–32)
Calcium: 9.6 mg/dL (ref 8.4–10.5)
Creatinine, Ser: 1.08 mg/dL (ref 0.40–1.20)
GFR: 51.51 mL/min — ABNORMAL LOW (ref >60.00)
GLUCOSE: 81 mg/dL (ref 70–99)
Potassium: 3.7 mEq/L (ref 3.5–5.1)
Sodium: 139 mEq/L (ref 135–145)

## 2016-01-28 MED ORDER — ONDANSETRON 4 MG PO TBDP: 4.0000 mg | ORAL_TABLET | Freq: Three times a day (TID) | ORAL | 0 refills | Status: DC | PRN

## 2016-01-28 MED ORDER — CEPHALEXIN 500 MG PO CAPS: 500.0000 mg | ORAL_CAPSULE | Freq: Three times a day (TID) | ORAL | 0 refills | Status: DC

## 2016-01-28 NOTE — Progress Notes (Signed)
Subjective:    Patient ID: Amber Rollins, female    DOB: 03-30-1931, 82 y.o.   MRN: [id number]  HPI This is a pleasant 82 yo female, accompanied by her son, who presents today with increased urinary frequency. She had loose stools last week- improved with immodium. Doesn't feel like she is running a fever. Feels a little woozy. She has had nausea, no vomiting. Little appetite. No recent falls since hospitalization earlier this month when she slipped and was unable to get up. She remained on the ground for multiple hours and had hypokalemia/hyponatremia/mild rhabdo She feels like she has made some progress until yesterday when she felt really bad. Doesn't feel as bad today. She is concerned because she was taken off her hctz that she has been on for many years. Has not noticed increased swelling in legs. She does not want to go to SNF. Her son believes she has adequate support between he, his sister and the patient's neighbors. The patient just wants to get back to her baseline. She has had decreased appetite since hospitalization.   Past Medical History:  Diagnosis Date  . Chronic foot pain    mortons neuroma  . Complication of anesthesia   . Cystocele 01/01   neg. sx  . fracture fibula ? 2003   left  . GERD (gastroesophageal reflux disease)   . Hemorrhoids   . Hyperlipemia 05/31/1992  . Hypertension 05/31/2000  . Menopausal symptoms    vasomotor symptoms-severe  . Osteoarthritis   . Osteopenia 11/29/1999  . Pedal edema    worsened by Norvasc  . Plantar fasciitis   . PONV (postoperative nausea and vomiting)    Past Surgical History:  Procedure Laterality Date  . Anterior repari w//surg proc  06/30/99   RSO  . BLADDER SUSPENSION    . BLADDER TACK    . CATARACTS    . Foot problems     neuromas  . history of CT     mass right adnexa, U?S by GYN-observation  . KYPHOPLASTY N/A 09/04/2012   Procedure: T7, T8,T12 Kyphoplasty ;  Surgeon: Clydene FakeJames R Hirsch, MD;  Location: [facility]UR[facility];  Service: Neurosurgery;  Laterality: N/A;  thoracic seven,eight, and twelve   . PARTIAL HYSTERECTOMY  1970   endometriosis   Family History  Problem Relation Age of Onset  . Diabetes Brother   . Hypertension Other   . Diabetes Mother   . Heart disease Mother     pacemaker  . Hypertension Mother   . COPD Father   . Diabetes Sister   . Mental illness Son     anxiety-mental health problems  . Diabetes Brother   . Diabetes Sister   . Diabetes Sister    Social History  Substance Use Topics  . Smoking status: Never Smoker  . Smokeless tobacco: Never Used  . Alcohol use No     Review of Systems Per HPI    Objective:   Physical Exam  Constitutional: She is oriented to person, place, and time. She appears well-developed and well-nourished. No distress.  HENT:  Head: Normocephalic and atraumatic.  Eyes: Conjunctivae are normal.  Cardiovascular: Normal rate, regular rhythm and normal heart sounds.   Pulmonary/Chest: Effort normal and breath sounds normal.  Abdominal: Soft. She exhibits no distension. There is no tenderness. There is no CVA tenderness.  Musculoskeletal: Edema: 1+ pretibial.  Neurological: She is alert and oriented to person, place, and time.  Skin: Skin is warm and dry. She is  not diaphoretic.  Psychiatric: She has a normal mood and affect. Her behavior is normal. Judgment and thought content normal.  Vitals reviewed.     BP 108/68   Pulse 75   Temp 97.9 F (36.6 C)   Wt 128 lb 12.8 oz (58.4 kg)   SpO2 98%   BMI 25.80 kg/m  Results for orders placed or performed in visit on 01/28/16  POCT Urinalysis Dipstick (Automated)  Result Value Ref Range   Color, UA yellow    Clarity, UA cloudy    Glucose, UA neg    Bilirubin, UA neg    Ketones, UA neg    Spec Grav, UA 1.020    Blood, UA 1+    pH, UA 6.0    Protein, UA neg    Urobilinogen, UA negative    Nitrite, UA neg    Leukocytes, UA large (3+) (A) Negative       Assessment & Plan:    1. Urinary frequency - POCT Urinalysis Dipstick (Automated)  2. Cystitis - Provided written and verbal information regarding diagnosis and treatment. - RTC precautions reviewed - cephALEXin (KEFLEX) 500 MG capsule; Take 1 capsule (500 mg total) by mouth 3 (three) times daily.  Dispense: 21 capsule; Refill: 0 - Urine culture  3. Nausea without vomiting - encouraged her to eat small frequent meals and to increase calories and protein (especially non meat forms). - ondansetron (ZOFRAN-ODT) 4 MG disintegrating tablet; Take 1 tablet (4 mg total) by mouth every 8 (eight) hours as needed for nausea.  Dispense: 12 tablet; Refill: 0 - Basic metabolic panel  - discussed nature of recovery following hospitalization and having realistic expectations. Encouraged patient to seek help for symptoms sooner rather than later to avoid acute, rapid decline at her advanced age.   Olean Ree, FNP-BC  [facility], [facility]  01/28/2016 2:19 PM  - cephALEXin (KEFLEX) 500 MG capsule; Take 1 capsule (500 mg total) by mouth 3 (three) times daily.  Dispense: 21 capsule; Refill: 0 - Urine culture  3. Nausea without vomiting - ondansetron (ZOFRAN-ODT) 4 MG disintegrating tablet; Take 1 tablet (4 mg total) by mouth every 8 (eight) hours as needed for nausea.  Dispense: 12 tablet; Refill: 0 - Basic metabolic panel   Olean Ree, FNP-BC  [facility], [facility]  01/28/2016 1:18 PM

## 2016-01-28 NOTE — Patient Instructions (Signed)
Please eat more regularly, drink enough liquids that your urine is light yellow  I have sent an antibiotic and anti nausea medicine to your pharmacy   Urinary Tract Infection Urinary tract infections (UTIs) can develop anywhere along your urinary tract. Your urinary tract is your body's drainage system for removing wastes and extra water. Your urinary tract includes two kidneys, two ureters, a bladder, and a urethra. Your kidneys are a pair of bean-shaped organs. Each kidney is about the size of your fist. They are located below your ribs, one on each side of your spine. CAUSES Infections are caused by microbes, which are microscopic organisms, including fungi, viruses, and bacteria. These organisms are so small that they can only be seen through a microscope. Bacteria are the microbes that most commonly cause UTIs. SYMPTOMS  Symptoms of UTIs may vary by age and gender of the patient and by the location of the infection. Symptoms in young women typically include a frequent and intense urge to urinate and a painful, burning feeling in the bladder or urethra during urination. Older women and men are more likely to be tired, shaky, and weak and have muscle aches and abdominal pain. A fever may mean the infection is in your kidneys. Other symptoms of a kidney infection include pain in your back or sides below the ribs, nausea, and vomiting. DIAGNOSIS To diagnose a UTI, your caregiver will ask you about your symptoms. Your caregiver will also ask you to provide a urine sample. The urine sample will be tested for bacteria and white blood cells. White blood cells are made by your body to help fight infection. TREATMENT  Typically, UTIs can be treated with medication. Because most UTIs are caused by a bacterial infection, they usually can be treated with the use of antibiotics. The choice of antibiotic and length of treatment depend on your symptoms and the type of bacteria causing your infection. HOME CARE  INSTRUCTIONS  If you were prescribed antibiotics, take them exactly as your caregiver instructs you. Finish the medication even if you feel better after you have only taken some of the medication.  Drink enough water and fluids to keep your urine clear or pale yellow.  Avoid caffeine, tea, and carbonated beverages. They tend to irritate your bladder.  Empty your bladder often. Avoid holding urine for long periods of time.  Empty your bladder before and after sexual intercourse.  After a bowel movement, women should cleanse from front to back. Use each tissue only once. SEEK MEDICAL CARE IF:   You have back pain.  You develop a fever.  Your symptoms do not begin to resolve within 3 days. SEEK IMMEDIATE MEDICAL CARE IF:   You have severe back pain or lower abdominal pain.  You develop chills.  You have nausea or vomiting.  You have continued burning or discomfort with urination. MAKE SURE YOU:   Understand these instructions.  Will watch your condition.  Will get help right away if you are not doing well or get worse.   This information is not intended to replace advice given to you by your health care provider. Make sure you discuss any questions you have with your health care provider.   Document Released: 02/24/2005 Document Revised: 02/05/2015 Document Reviewed: 06/25/2011 Elsevier Interactive Patient Education [employer]2016 Elsevier Inc.

## 2016-01-30 ENCOUNTER — Ambulatory Visit: Payer: Self-pay | Admitting: Family Medicine

## 2016-01-30 LAB — URINE CULTURE

## 2016-02-05 ENCOUNTER — Telehealth: Payer: Self-pay

## 2016-02-05 ENCOUNTER — Other Ambulatory Visit: Payer: Self-pay | Admitting: Family Medicine

## 2016-02-05 DIAGNOSIS — B373 Candidiasis of vulva and vagina: Secondary | ICD-10-CM

## 2016-02-05 DIAGNOSIS — B3731 Acute candidiasis of vulva and vagina: Secondary | ICD-10-CM

## 2016-02-05 MED ORDER — FLUCONAZOLE 150 MG PO TABS: 150.0000 mg | ORAL_TABLET | Freq: Once | ORAL | 0 refills | Status: AC

## 2016-02-05 NOTE — Telephone Encounter (Signed)
I spoke with pt and she thinks she took diflucan before and did not have any problem; pt does not want to use cream due to being too messy. Diflucan is on pts hx med list 2015. Pt was appreciative and will get med at pharmacy.pt will cb if any problems.FYI to Harlin Heys Gessner FNP.

## 2016-02-05 NOTE — Telephone Encounter (Signed)
Pt left v/m; pt was seen 01/28/16 and was given abx; now pt has vaginal itching. Pt request med to [facility]. Pt request cb when done.

## 2016-02-05 NOTE — Telephone Encounter (Signed)
I have sent a prescription for diflucan pills to her pharmacy. I am aware that she has many drug sensitivities, please ask her if she has used this before. If she has had problems with it, we can use a vaginal cream instead.

## 2016-02-27 ENCOUNTER — Other Ambulatory Visit: Payer: Self-pay | Admitting: Family Medicine

## 2016-02-27 NOTE — Telephone Encounter (Signed)
Ronnie DossYes-they are different-one is a nasal antihistamine and one is a nasal steroid - not usual to be on both of them  Please approve for a year

## 2016-02-27 NOTE — Telephone Encounter (Signed)
?   If pt should be of both nasal sprays please advise

## 2016-02-27 NOTE — Telephone Encounter (Signed)
done

## 2016-03-17 ENCOUNTER — Ambulatory Visit (INDEPENDENT_AMBULATORY_CARE_PROVIDER_SITE_OTHER): Payer: PPO

## 2016-03-17 DIAGNOSIS — Z23 Encounter for immunization: Secondary | ICD-10-CM

## 2016-03-29 ENCOUNTER — Ambulatory Visit: Payer: PPO | Admitting: Family Medicine

## 2016-03-29 ENCOUNTER — Encounter: Payer: Self-pay | Admitting: Family Medicine

## 2016-03-29 ENCOUNTER — Ambulatory Visit (INDEPENDENT_AMBULATORY_CARE_PROVIDER_SITE_OTHER): Payer: PPO | Admitting: Family Medicine

## 2016-03-29 DIAGNOSIS — R197 Diarrhea, unspecified: Secondary | ICD-10-CM | POA: Insufficient documentation

## 2016-03-29 DIAGNOSIS — R109 Unspecified abdominal pain: Secondary | ICD-10-CM | POA: Insufficient documentation

## 2016-03-29 LAB — COMPREHENSIVE METABOLIC PANEL
ALK PHOS: 59 U/L (ref 39–117)
ALT: 10 U/L (ref 0–35)
AST: 16 U/L (ref 0–37)
Albumin: 4.1 g/dL (ref 3.5–5.2)
BUN: 20 mg/dL (ref 6–23)
CO2: 32 meq/L (ref 19–32)
Calcium: 10 mg/dL (ref 8.4–10.5)
Chloride: 100 mEq/L (ref 96–112)
Creatinine, Ser: 1.19 mg/dL (ref 0.40–1.20)
GFR: 46.04 mL/min — AB (ref >60.00)
GLUCOSE: 76 mg/dL (ref 70–99)
POTASSIUM: 3.9 meq/L (ref 3.5–5.1)
Sodium: 140 mEq/L (ref 135–145)
TOTAL PROTEIN: 7 g/dL (ref 6.0–8.3)
Total Bilirubin: 0.5 mg/dL (ref 0.2–1.2)

## 2016-03-29 NOTE — Addendum Note (Signed)
Addended by: Alvina ChouWALSH, TERRI J on: 03/29/2016 12:26 PM   Modules accepted: Orders

## 2016-03-29 NOTE — Progress Notes (Signed)
Subjective:   Patient ID: Lenn SinkShirley Anne Dimascio, female    DOB: 30-Nov-1930, 82 y.o.   MRN: [id number]  Lenn SinkShirley Anne Carano is a pleasant 82 y.o. year old female pt of Dr. Milinda Antisower, new to me, who presents to clinic today with Abdominal Pain and Diarrhea  on 03/29/2016  HPI:  Diarrhea- 3 days of diarrhea with abdominal cramping.  Watery stools.  Took imodium.  Better today.  No nausea, vomiting or fever.  No one is sick around her.  Did take a course of abx last month.  Wt Readings from Last 3 Encounters:  03/29/16 131 lb (59.4 kg)  01/28/16 128 lb 12.8 oz (58.4 kg)  01/06/16 133 lb 4 oz (60.4 kg)     Current Outpatient Prescriptions on File Prior to Visit  Medication Sig Dispense Refill  . azelastine (ASTELIN) 0.1 % nasal spray PLACE 2 SPRAYS INTO BOTH NOSTRILS 2 TIMES DAILY. 30 mL 11  . Cholecalciferol (VITAMIN D3) 2000 UNITS TABS Take 1 tablet by mouth 2 (two) times daily.    . fexofenadine (ALLEGRA) 180 MG tablet Take 180 mg by mouth daily as needed (allergies).    . fluticasone (FLONASE) 50 MCG/ACT nasal spray PLACE 1 TO 2 SPRAYS IN EACH NOSTRIL DAILY. 16 g 11  . mometasone (ELOCON) 0.1 % cream Apply to affected area in small amount 3 times weekly as needed 45 g 3  . omeprazole (PRILOSEC) 20 MG capsule TAKE 1 CAPSULE BY MOUTH 2 TIMES DAILY. (Patient taking differently: TAKE 1 CAPSULE BY MOUTH ONCE DAILY) 180 capsule 1  . ondansetron (ZOFRAN-ODT) 4 MG disintegrating tablet Take 1 tablet (4 mg total) by mouth every 8 (eight) hours as needed for nausea. 12 tablet 0  . traMADol (ULTRAM) 50 MG tablet Take 50 mg by mouth every 12 (twelve) hours as needed.     No current facility-administered medications on file prior to visit.     Allergies  Allergen Reactions  . Tdap [Diphth-Acell Pertussis-Tetanus] Swelling  . Amlodipine Besylate     REACTION: edema  . Codeine     headache  . Hydrocodone     hallucinations  . Lovastatin     REACTION: leg pain  . Metronidazole  Swelling    And body aches  . Penicillins     Unknown reaction; has tolerated cephalosporins  . Sertraline Hcl     REACTION: caused insomnia  . Simvastatin     REACTION: non tolerant- leg pain  . Latex Rash  . Sulfonamide Derivatives Rash    Past Medical History:  Diagnosis Date  . Chronic foot pain    mortons neuroma  . Complication of anesthesia   . Cystocele 01/01   neg. sx  . fracture fibula ? 2003   left  . GERD (gastroesophageal reflux disease)   . Hemorrhoids   . Hyperlipemia 05/31/1992  . Hypertension 05/31/2000  . Menopausal symptoms    vasomotor symptoms-severe  . Osteoarthritis   . Osteopenia 11/29/1999  . Pedal edema    worsened by Norvasc  . Plantar fasciitis   . PONV (postoperative nausea and vomiting)     Past Surgical History:  Procedure Laterality Date  . Anterior repari w//surg proc  06/30/99   RSO  . BLADDER SUSPENSION    . BLADDER TACK    . CATARACTS    . Foot problems     neuromas  . history of CT     mass right adnexa, U?S by GYN-observation  . KYPHOPLASTY N/A 09/04/2012  Procedure: T7, T8,T12 Kyphoplasty ;  Surgeon: Clydene FakeJames R Hirsch, MD;  Location: [facility];  Service: Neurosurgery;  Laterality: N/A;  thoracic seven,eight, and twelve   . PARTIAL HYSTERECTOMY  1970   endometriosis    Family History  Problem Relation Age of Onset  . Diabetes Brother   . Hypertension Other   . Diabetes Mother   . Heart disease Mother     pacemaker  . Hypertension Mother   . COPD Father   . Diabetes Sister   . Mental illness Son     anxiety-mental health problems  . Diabetes Brother   . Diabetes Sister   . Diabetes Sister     Social History   Social History  . Marital status: Widowed    Spouse name: N/A  . Number of children: N/A  . Years of education: N/A   Occupational History  . Not on file.   Social History Main Topics  . Smoking status: Never Smoker  . Smokeless tobacco: Never Used  . Alcohol use No  . Drug use: No  . Sexual  activity: Not on file   Other Topics Concern  . Not on file   Social History Narrative   Allergic to TD   Does some volunteer visits to [facility] homes   The PMH, PSH, Social History, Family History, Medications, and allergies [facility], and have been updated if relevant.   Review of Systems  Constitutional: Negative.   Gastrointestinal: Positive for abdominal pain and diarrhea. Negative for anal bleeding, blood in stool, constipation, nausea, rectal pain and vomiting.  All other systems reviewed and are negative.      Objective:    Pulse 73   Temp 97.8 F (36.6 C) (Oral)   Wt 131 lb (59.4 kg)   SpO2 95%   BMI 26.24 kg/m    Physical Exam  Constitutional: She is oriented to person, place, and time. She appears well-developed and well-nourished. No distress.  HENT:  Head: Normocephalic.  Eyes: Conjunctivae are normal.  Cardiovascular: Normal rate.   Pulmonary/Chest: Effort normal.  Abdominal: Soft. Bowel sounds are normal. She exhibits no distension and no mass. There is no tenderness. There is no rebound and no guarding.  Musculoskeletal: Normal range of motion.  Neurological: She is alert and oriented to person, place, and time. No cranial nerve deficit.  Skin: Skin is warm and dry. She is not diaphoretic.  Psychiatric: She has a normal mood and affect. Her behavior is normal. Judgment and thought content normal.  Nursing note and vitals reviewed.         Assessment & Plan:   Diarrhea, unspecified type No Follow-up on file.

## 2016-03-29 NOTE — Progress Notes (Signed)
Pre visit review using our clinic review tool, if applicable. No additional management support is needed unless otherwise documented below in the visit note. 

## 2016-03-29 NOTE — Patient Instructions (Signed)
Great to see you. We will call you with your results.  Drink plenty of fluids.

## 2016-03-29 NOTE — Assessment & Plan Note (Signed)
New- resolving. Exam reassuring. Will check labs and c diff today. Push fluids Call or return to clinic prn if these symptoms worsen or fail to improve as anticipated. The patient indicates understanding of these issues and agrees with the plan.

## 2016-03-30 ENCOUNTER — Other Ambulatory Visit: Payer: Self-pay | Admitting: Family Medicine

## 2016-03-30 NOTE — Telephone Encounter (Signed)
I was not aware of the fall, is she ok ? Has she been checked out? Have her come in if she has a lot of pain I did refill the tramadol, Px written for call in   Be aware that this medication can also cause sedation and falls so be very careful

## 2016-03-30 NOTE — Telephone Encounter (Signed)
Pt has called back to follow-up on refill. Said she is in a lot more pain from her fall today and Tylenol is not helping.

## 2016-03-30 NOTE — Telephone Encounter (Signed)
I can't tell the last time this has been filled it's on the med list as a historical entry, please advise

## 2016-03-31 DIAGNOSIS — R109 Unspecified abdominal pain: Secondary | ICD-10-CM | POA: Diagnosis not present

## 2016-03-31 DIAGNOSIS — R197 Diarrhea, unspecified: Secondary | ICD-10-CM | POA: Diagnosis not present

## 2016-03-31 NOTE — Addendum Note (Signed)
Addended by: Alvina ChouWALSH, TERRI J on: 03/31/2016 04:47 PM   Modules accepted: Orders

## 2016-03-31 NOTE — Telephone Encounter (Signed)
Rx called in as prescribed  Pt said that she didn't have a new fall. Pt fell in Feb through her attic and was in rehab a few months after that, pt said that she saw Dr. Dayton MartesAron on 03/29/16 for GI issues and she thinks that the GI issues along may have triggered her back pain to flair up. Pt said usually a usually taking the tramadol for a little while will help the pain so she declined to schedule a f/u now but she said if her back pain worsens she will call back to schedule a f/u I advise her of the sedation/fall risk with tramadol but she thinks she will be okay because she has taken it in the past with no issues for her back

## 2016-04-01 LAB — C. DIFFICILE GDH AND TOXIN A/B
C. DIFF TOXIN A/B: NOT DETECTED
C. DIFFICILE GDH: NOT DETECTED

## 2016-06-21 ENCOUNTER — Other Ambulatory Visit: Payer: Self-pay | Admitting: Family Medicine

## 2016-07-26 ENCOUNTER — Encounter: Payer: Self-pay | Admitting: Family Medicine

## 2016-07-26 ENCOUNTER — Ambulatory Visit (INDEPENDENT_AMBULATORY_CARE_PROVIDER_SITE_OTHER): Payer: PPO | Admitting: Family Medicine

## 2016-07-26 VITALS — BP 112/66 | HR 77 | Temp 98.0°F | Ht 59.25 in | Wt 129.5 lb

## 2016-07-26 DIAGNOSIS — W06XXXA Fall from bed, initial encounter: Secondary | ICD-10-CM

## 2016-07-26 DIAGNOSIS — I10 Essential (primary) hypertension: Secondary | ICD-10-CM | POA: Diagnosis not present

## 2016-07-26 DIAGNOSIS — F418 Other specified anxiety disorders: Secondary | ICD-10-CM | POA: Diagnosis not present

## 2016-07-26 DIAGNOSIS — R2689 Other abnormalities of gait and mobility: Secondary | ICD-10-CM

## 2016-07-26 DIAGNOSIS — F329 Major depressive disorder, single episode, unspecified: Secondary | ICD-10-CM

## 2016-07-26 DIAGNOSIS — F419 Anxiety disorder, unspecified: Principal | ICD-10-CM

## 2016-07-26 MED ORDER — ESCITALOPRAM OXALATE 10 MG PO TABS: 10.0000 mg | ORAL_TABLET | Freq: Every day | ORAL | 11 refills | Status: DC

## 2016-07-26 NOTE — Assessment & Plan Note (Signed)
Pt rolled out of bed and hit her night stand (never happened before) Unsure if dreaming  Cut face and bruised it  No headaches or other symptoms  She did not push life alert or call family  Exam re assuring  Disc imp of use of a bed rail (she has one and will put it on today)  Stressed imp of safety precautions if she wants to continue to live independently

## 2016-07-26 NOTE — Assessment & Plan Note (Signed)
bp in fair control at this time  BP Readings from Last 1 Encounters:  07/26/16 112/66   No changes needed Disc lifstyle change with low sodium diet and exercise

## 2016-07-26 NOTE — Progress Notes (Signed)
Subjective:    Patient ID: Amber Rollins, female    DOB: 21-Apr-1931, 83 y.o.   MRN: [id number]  HPI Here for c/o of a fall  Dizzy spells are listed on her intake-she denies this   Also depression She stopped paxil because it made her dizzy and she does not think it worked  Had side effects on 20, 10 mg was better but it did not help  She felt slowed down on it    She rolled off the bed and she hit the night stand - with her face  Black and blue  Never fell out of bed before   Feels lonely and lethargic  Still grief for loss of her husband  Has animals  Has had anxiety in the past- states she has some symptoms of anxiety  Appetite is good however  Did sleep better on paxil Thinks she feels nervous at times- but difficult to specify Not interested in anything in general   Biggest worry - has not been able to sell her land  Her son is helping her with that Family is fair support   She prefers to live alone  Feels safe with alarms and med alert button    Goes out to church and with friends  Lives by herself - watches TV / housework /pays bills  Visits an assisted living to volunteer     Wt Readings from Last 3 Encounters:  07/26/16 129 lb 8 oz (58.7 kg)  03/29/16 131 lb (59.4 kg)  01/28/16 128 lb 12.8 oz (58.4 kg)     bp is stable today  No cp or palpitations or headaches or edema  No side effects to medicines  BP Readings from Last 3 Encounters:  07/26/16 112/66  03/29/16 124/66  01/28/16 108/68     Brought the friend back who brought her (Marty)-she states that she is worried about pt's balance in general and her fall risk Notes that she often staggers around with bad balance  Pt has done PT for balance and has home exercises  Has a walker that she does not use   Patient Active Problem List   Diagnosis Date Noted  . Fall from bed 07/26/2016  . Diarrhea 03/29/2016  . Hyponatremia 01/01/2016  . Routine general medical examination at a health  care facility 11/10/2015  . Urinary frequency 10/23/2014  . Fatigue 10/23/2014  . Hammer toe 09/20/2014  . Herpetic dermatitis 08/09/2014  . Candidal intertrigo 02/11/2014  . Poor balance 04/11/2013  . Thoracic back pain 04/11/2013  . Urinary incontinence 12/27/2012  . Depression 09/24/2012  . Thoracic kyphosis 09/24/2012  . Fall 08/09/2012  . Hemorrhoid 12/24/2011  . Low back pain 12/24/2011  . Hypokalemia 10/06/2010  . HEMATURIA UNSPECIFIED 10/02/2009  . NIGHT SWEATS 10/02/2009  . GERD 06/18/2009  . INCONTINENCE, URGE 06/18/2009  . FATIGUE 03/12/2009  . HYPOKALEMIA 10/08/2008  . MENOPAUSAL SYNDROME 01/22/2008  . EDEMA 07/19/2007  . Anxiety and depression 06/21/2007  . REACTION, ACUTE STRESS W/EMOTIONAL DSTURB 03/06/2007  . FOOT PAIN, CHRONIC 03/06/2007  . ALLERGIC RHINITIS, SEASONAL 09/12/2006  . IRRITABLE BOWEL SYNDROME 09/12/2006  . FIBROCYSTIC BREAST DISEASE 09/12/2006  . OSTEOARTHRITIS 09/12/2006  . HEMORRHOIDS, HX OF 09/12/2006  . Essential hypertension 05/31/2000  . Disorder of bone and cartilage 11/29/1999  . Hyperlipidemia 05/31/1992   Past Medical History:  Diagnosis Date  . Chronic foot pain    mortons neuroma  . Complication of anesthesia   . Cystocele 01/01   neg.  sx  . fracture fibula ? 2003   left  . GERD (gastroesophageal reflux disease)   . Hemorrhoids   . Hyperlipemia 05/31/1992  . Hypertension 05/31/2000  . Menopausal symptoms    vasomotor symptoms-severe  . Osteoarthritis   . Osteopenia 11/29/1999  . Pedal edema    worsened by Norvasc  . Plantar fasciitis   . PONV (postoperative nausea and vomiting)    Past Surgical History:  Procedure Laterality Date  . Anterior repari w//surg proc  06/30/99   RSO  . BLADDER SUSPENSION    . BLADDER TACK    . CATARACTS    . Foot problems     neuromas  . history of CT     mass right adnexa, U?S by GYN-observation  . KYPHOPLASTY N/A 09/04/2012   Procedure: T7, T8,T12 Kyphoplasty ;  Surgeon: Clydene Fake, MD;  Location: [facility];  Service: Neurosurgery;  Laterality: N/A;  thoracic seven,eight, and twelve   . PARTIAL HYSTERECTOMY  1970   endometriosis   Social History  Substance Use Topics  . Smoking status: Never Smoker  . Smokeless tobacco: Never Used  . Alcohol use No   Family History  Problem Relation Age of Onset  . Diabetes Brother   . Hypertension Other   . Diabetes Mother   . Heart disease Mother     pacemaker  . Hypertension Mother   . COPD Father   . Diabetes Sister   . Mental illness Son     anxiety-mental health problems  . Diabetes Brother   . Diabetes Sister   . Diabetes Sister    Allergies  Allergen Reactions  . Tdap [Diphth-Acell Pertussis-Tetanus] Swelling  . Amlodipine Besylate     REACTION: edema  . Codeine     headache  . Hydrocodone     hallucinations  . Lovastatin     REACTION: leg pain  . Metronidazole Swelling    And body aches  . Penicillins     Unknown reaction; has tolerated cephalosporins  . Sertraline Hcl     REACTION: caused insomnia  . Simvastatin     REACTION: non tolerant- leg pain  . Latex Rash  . Sulfonamide Derivatives Rash   Current Outpatient Prescriptions on File Prior to Visit  Medication Sig Dispense Refill  . azelastine (ASTELIN) 0.1 % nasal spray PLACE 2 SPRAYS INTO BOTH NOSTRILS 2 TIMES DAILY. 30 mL 11  . Cholecalciferol (VITAMIN D3) 2000 UNITS TABS Take 1 tablet by mouth daily.     . fexofenadine (ALLEGRA) 180 MG tablet Take 180 mg by mouth daily as needed (allergies).    . fluticasone (FLONASE) 50 MCG/ACT nasal spray PLACE 1 TO 2 SPRAYS IN EACH NOSTRIL DAILY. 16 g 11  . mometasone (ELOCON) 0.1 % cream Apply to affected area in small amount 3 times weekly as needed 45 g 3  . omeprazole (PRILOSEC) 20 MG capsule TAKE 1 CAPSULE BY MOUTH 2 TIMES DAILY. (Patient taking differently: TAKE 1 CAPSULE BY MOUTH ONCE DAILY) 180 capsule 0  . traMADol (ULTRAM) 50 MG tablet TAKE 1 TABLET BY MOUTH EVERY 8 HOURS AS NEEDED  FOR SEVERE PAIN. 30 tablet 1   No current facility-administered medications on file prior to visit.      Review of Systems Review of Systems  Constitutional: Negative for fever, appetite change,  and unexpected weight change. pos for fatigue  Eyes: Negative for pain and visual disturbance.  Respiratory: Negative for cough and shortness of breath.   Cardiovascular:  Negative for cp or palpitations    Gastrointestinal: Negative for nausea, diarrhea and constipation.  Genitourinary: Negative for urgency and frequency.  Skin: Negative for pallor or rash   Neurological: Negative for weakness, light-headedness, numbness and headaches.  Hematological: Negative for adenopathy. Does not bruise/bleed easily.  Psychiatric/Behavioral: pos for depressed mood with anxious symptoms/ poor sleep and low motivation, neg for SI        Objective:   Physical Exam  Constitutional: She appears well-developed and well-nourished. No distress.  Frail appearing elderly female with bruised face and kyphosis   HENT:  Head: Normocephalic.  Right Ear: External ear normal.  Left Ear: External ear normal.  Mouth/Throat: Oropharynx is clear and moist. No oropharyngeal exudate.  Old ecchymosis over L cheek bone down to chin  Scab over prev laceration is healing with no s/s of infection  Mildly tender  Nl rom of jaw and facial muscles   Eyes: Conjunctivae and EOM are normal. Pupils are equal, round, and reactive to light. No scleral icterus.  Neck: Normal range of motion. Neck supple. No JVD present. Carotid bruit is not present. No thyromegaly present.  Cardiovascular: Normal rate, regular rhythm, normal heart sounds and intact distal pulses.  Exam reveals no gallop.   Pulmonary/Chest: Effort normal and breath sounds normal. No respiratory distress. She has no wheezes. She has no rales.  No crackles  Abdominal: Soft. Bowel sounds are normal. She exhibits no distension, no abdominal bruit and no mass. There is no  tenderness.  Musculoskeletal: She exhibits no edema or tenderness.  Severe kyphosis limiting spinal rom   Lymphadenopathy:    She has no cervical adenopathy.  Neurological: She is alert. She has normal reflexes. She displays no atrophy. No cranial nerve deficit or sensory deficit. She exhibits normal muscle tone. Gait abnormal.  Gait is generally unsteady without certain ataxia   Skin: Skin is warm and dry. No rash noted. No pallor.  Psychiatric: Her mood appears anxious. Her affect is not blunt, not labile and not inappropriate. Her speech is delayed and tangential. She is slowed. She is not agitated, not aggressive and not hyperactive. Thought content is not paranoid. Cognition and memory are not impaired. She exhibits a depressed mood. She expresses no homicidal and no suicidal ideation.  Generally depressed  Slowed responses  Some difficulty answering questions -otherwise seemingly mentally sharp          Assessment & Plan:   Problem List Items Addressed This Visit      Cardiovascular and Mediastinum   Essential hypertension - Primary    bp in fair control at this time  BP Readings from Last 1 Encounters:  07/26/16 112/66   No changes needed Disc lifstyle change with low sodium diet and exercise          Other   Anxiety and depression    In the past intol of sertraline and paxil (which helped at higher doses but not at lower dose which was better tolerated) Reviewed stressors/ coping techniques/symptoms/ support sources/ tx options and side effects in detail today  Needs to be more social  Motivation waxes and wanes Still insists on living alone  Offered counseling-she declines Trial of lexapro- hope it will be effective at lower dose w/o as much sedation  F/u 1 mo  Disc poss side eff incl worse mood or si -inst to stop med and call if any problems  Stressed imp of physical and mental activity and outdoor time  Fall from bed    Pt rolled out of bed and hit  her night stand (never happened before) Unsure if dreaming  Cut face and bruised it  No headaches or other symptoms  She did not push life alert or call family  Exam re assuring  Disc imp of use of a bed rail (she has one and will put it on today)  Stressed imp of safety precautions if she wants to continue to live independently       Poor balance    Friend here with her is concerned about this  No ataxia She has a high fall risk Likely multi factorial in frail elderly female with hx of falls and fractures  Has been worked up  Strongly recommend using walker when out and as needed when in- keeping close by She has life alert- unsure she will use it  >25 minutes spent in face to face time with patient, >50% spent in counselling or coordination of care

## 2016-07-26 NOTE — Assessment & Plan Note (Signed)
Friend here with her is concerned about this  No ataxia She has a high fall risk Likely multi factorial in frail elderly female with hx of falls and fractures  Has been worked up  Strongly recommend using walker when out and as needed when in- keeping close by She has life alert- unsure she will use it  >25 minutes spent in face to face time with patient, >50% spent in counselling or coordination of care

## 2016-07-26 NOTE — Assessment & Plan Note (Signed)
In the past intol of sertraline and paxil (which helped at higher doses but not at lower dose which was better tolerated) Reviewed stressors/ coping techniques/symptoms/ support sources/ tx options and side effects in detail today  Needs to be more social  Motivation waxes and wanes Still insists on living alone  Offered counseling-she declines Trial of lexapro- hope it will be effective at lower dose w/o as much sedation  F/u 1 mo  Disc poss side eff incl worse mood or si -inst to stop med and call if any problems  Stressed imp of physical and mental activity and outdoor time

## 2016-07-26 NOTE — Patient Instructions (Addendum)
You need to put a rail on your bed to keep from falling out again  Or put you mattress on the floor  I want you to start using a walker - out of the house (and when needed inside the house)  Let's try Lexapro for depression with anxiety - if you develop intolerable side effects or feel worse- stop it and let me know  If you want to see a counselor in the future- let me know  Get out as much as you can Socialize as much as you can  Follow up with me in about a month

## 2016-07-26 NOTE — Progress Notes (Signed)
Pre visit review using our clinic review tool, if applicable. No additional management support is needed unless otherwise documented below in the visit note. 

## 2016-08-04 DIAGNOSIS — I739 Peripheral vascular disease, unspecified: Secondary | ICD-10-CM | POA: Diagnosis not present

## 2016-08-04 DIAGNOSIS — L603 Nail dystrophy: Secondary | ICD-10-CM | POA: Diagnosis not present

## 2016-08-04 DIAGNOSIS — L84 Corns and callosities: Secondary | ICD-10-CM | POA: Diagnosis not present

## 2016-08-23 ENCOUNTER — Ambulatory Visit (INDEPENDENT_AMBULATORY_CARE_PROVIDER_SITE_OTHER): Payer: PPO | Admitting: Family Medicine

## 2016-08-23 ENCOUNTER — Encounter: Payer: Self-pay | Admitting: Family Medicine

## 2016-08-23 VITALS — BP 136/70 | HR 79 | Temp 98.4°F | Ht 59.25 in | Wt 128.5 lb

## 2016-08-23 DIAGNOSIS — R350 Frequency of micturition: Secondary | ICD-10-CM

## 2016-08-23 DIAGNOSIS — W06XXXS Fall from bed, sequela: Secondary | ICD-10-CM | POA: Diagnosis not present

## 2016-08-23 DIAGNOSIS — M79672 Pain in left foot: Secondary | ICD-10-CM | POA: Diagnosis not present

## 2016-08-23 DIAGNOSIS — R300 Dysuria: Secondary | ICD-10-CM | POA: Diagnosis not present

## 2016-08-23 DIAGNOSIS — M79671 Pain in right foot: Secondary | ICD-10-CM | POA: Insufficient documentation

## 2016-08-23 DIAGNOSIS — R3915 Urgency of urination: Secondary | ICD-10-CM | POA: Diagnosis not present

## 2016-08-23 DIAGNOSIS — F329 Major depressive disorder, single episode, unspecified: Secondary | ICD-10-CM

## 2016-08-23 DIAGNOSIS — F418 Other specified anxiety disorders: Secondary | ICD-10-CM | POA: Diagnosis not present

## 2016-08-23 DIAGNOSIS — F32A Depression, unspecified: Secondary | ICD-10-CM

## 2016-08-23 DIAGNOSIS — F419 Anxiety disorder, unspecified: Principal | ICD-10-CM

## 2016-08-23 LAB — POC URINALSYSI DIPSTICK (AUTOMATED)
Bilirubin, UA: NEGATIVE
Glucose, UA: NEGATIVE
KETONES UA: NEGATIVE
Nitrite, UA: NEGATIVE
PH UA: 6 (ref 5.0–8.0)
Spec Grav, UA: 1.03 (ref 1.030–1.035)
Urobilinogen, UA: 0.2 (ref ?–2.0)

## 2016-08-23 MED ORDER — ESCITALOPRAM OXALATE 20 MG PO TABS: 20.0000 mg | ORAL_TABLET | Freq: Every day | ORAL | 11 refills | Status: DC

## 2016-08-23 NOTE — Assessment & Plan Note (Signed)
From thickened nails/callouses/degenerative change  R foot- 2nd toe completely overlaps her first  Pt states pain from all of these problems keeps her from being active ([facility]) She needs a new podiatrist covered under her plan for nail and skin care She desires ortho ref for the hammer toe  Referrals done  Perhaps some relief would enable her to become more independent

## 2016-08-23 NOTE — Patient Instructions (Addendum)
A good strategy for better mood is to stay busy and helping /doing things for other people   You need 64 oz of fluids per day - mostly water  Your urine is too concentrated   Stop up front for your referrals for orthopedics and podiatry   We will send urine for a culture and let you know if there is an infection   Increase your lexapro to 20 mg once daily  Alert me if side effects or worse depression or anxiety  Follow up in 6-8 weeks

## 2016-08-23 NOTE — Assessment & Plan Note (Signed)
ua with tr wbc/rbc Sent for culture  Also urine is concentrated- disc imp of more fluids - rev with pt and her daughter

## 2016-08-23 NOTE — Assessment & Plan Note (Signed)
Pt did get a bed hand rail that keeps her from rolling out and assists getting up  Px written for her med supply to see if ins would cover this

## 2016-08-23 NOTE — Progress Notes (Signed)
Pre visit review using our clinic review tool, if applicable. No additional management support is needed unless otherwise documented below in the visit note. 

## 2016-08-23 NOTE — Progress Notes (Signed)
Subjective:    Patient ID: Amber Rollins, female    DOB: [birth year], 83 y.o.   MRN: [id number]  HPI  Here for f/u of depression and anxiety  Last visit started lexapro 10 mg daily   Here with her daughter today who is helpful   Has not noticed some change in mood  A little less anxious  Not a lot of change in sadness  No change in motivation  A little less frustrated  Does not think she is irritable   Wants to get energy and go out   Does not have a hobby - [facility] -had not been able to go because of foot pain  Would like to work in the yard  [employer]oves to shop   Has not felt up to doing these things   Declines counseling  Would be open to a journal  Does like to talk on the phone - 2 sisters in town and friends - does not talk about mood however    Enjoyed taking grand child to an [employer] egg hunt    Still tired and unmotivated in the am    We also discussed balance problems at that time She had exp fall from bed as wll   She reports urinary symptoms today as well -frequency  Has had a bladder tack up in the past  Some urge incontinence  Some burning on urination (not every time but sometimes) Has been going on since last visit  Urine does not smell funny  No blood in urine that she can see  Results for orders placed or performed in visit on 08/23/16  POCT Urinalysis Dipstick (Automated)  Result Value Ref Range   Color, UA Yellow    Clarity, UA Clear    Glucose, UA Negative    Bilirubin, UA Negative    Ketones, UA Negative    Spec Grav, UA 1.030 1.030 - 1.035   Blood, UA 25 Ery/uL    pH, UA 6.0 5.0 - 8.0   Protein, UA Trace    Urobilinogen, UA 0.2 Negative - 2.0   Nitrite, UA Negative    Leukocytes, UA small (1+) (A) Negative    Drinks one glass of water a day    Wt Readings from Last 3 Encounters:  08/23/16 128 lb 8 oz (58.3 kg)  07/26/16 129 lb 8 oz (58.7 kg)  03/29/16 131 lb (59.4 kg)    Feet are really bothering her  A  lot of chronic foot pain  She goes to the podiatrist  Her 2nd toe overlaps her first toe in R foot   Toe nails get very thik and painful  Has had 4 neuromas   Patient Active Problem List   Diagnosis Date Noted  . Foot pain, bilateral 08/23/2016  . Fall from bed 07/26/2016  . Diarrhea 03/29/2016  . Routine general medical examination at a health care facility 11/10/2015  . Urinary frequency 10/23/2014  . Fatigue 10/23/2014  . Hammer toe 09/20/2014  . Herpetic dermatitis 08/09/2014  . Candidal intertrigo 02/11/2014  . Poor balance 04/11/2013  . Thoracic back pain 04/11/2013  . Urinary incontinence 12/27/2012  . Depression 09/24/2012  . Thoracic kyphosis 09/24/2012  . Fall 08/09/2012  . Hemorrhoid 12/24/2011  . Dysuria 12/24/2011  . Low back pain 12/24/2011  . Hypokalemia 10/06/2010  . HEMATURIA UNSPECIFIED 10/02/2009  . NIGHT SWEATS 10/02/2009  . GERD 06/18/2009  . INCONTINENCE, URGE 06/18/2009  . FATIGUE 03/12/2009  .  HYPOKALEMIA 10/08/2008  . MENOPAUSAL SYNDROME 01/22/2008  . EDEMA 07/19/2007  . Anxiety and depression 06/21/2007  . REACTION, ACUTE STRESS W/EMOTIONAL DSTURB 03/06/2007  . FOOT PAIN, CHRONIC 03/06/2007  . ALLERGIC RHINITIS, SEASONAL 09/12/2006  . IRRITABLE BOWEL SYNDROME 09/12/2006  . FIBROCYSTIC BREAST DISEASE 09/12/2006  . OSTEOARTHRITIS 09/12/2006  . HEMORRHOIDS, HX OF 09/12/2006  . Essential hypertension 05/31/2000  . Disorder of bone and cartilage 11/29/1999  . Hyperlipidemia 05/31/1992   Past Medical History:  Diagnosis Date  . Chronic foot pain    mortons neuroma  . Complication of anesthesia   . Cystocele 01/01   neg. sx  . fracture fibula ? 2003   left  . GERD (gastroesophageal reflux disease)   . Hemorrhoids   . Hyperlipemia 05/31/1992  . Hypertension 05/31/2000  . Menopausal symptoms    vasomotor symptoms-severe  . Osteoarthritis   . Osteopenia 11/29/1999  . Pedal edema    worsened by Norvasc  . Plantar fasciitis   . PONV  (postoperative nausea and vomiting)    Past Surgical History:  Procedure Laterality Date  . Anterior repari w//surg proc  06/30/99   RSO  . BLADDER SUSPENSION    . BLADDER TACK    . CATARACTS    . Foot problems     neuromas  . history of CT     mass right adnexa, U?S by GYN-observation  . KYPHOPLASTY N/A 09/04/2012   Procedure: T7, T8,T12 Kyphoplasty ;  Surgeon: Clydene Fake, MD;  Location: [facility];  Service: Neurosurgery;  Laterality: N/A;  thoracic seven,eight, and twelve   . PARTIAL HYSTERECTOMY  1970   endometriosis   Social History  Substance Use Topics  . Smoking status: Never Smoker  . Smokeless tobacco: Never Used  . Alcohol use No   Family History  Problem Relation Age of Onset  . Diabetes Brother   . Hypertension Other   . Diabetes Mother   . Heart disease Mother     pacemaker  . Hypertension Mother   . COPD Father   . Diabetes Sister   . Mental illness Son     anxiety-mental health problems  . Diabetes Brother   . Diabetes Sister   . Diabetes Sister    Allergies  Allergen Reactions  . Tdap [Diphth-Acell Pertussis-Tetanus] Swelling  . Amlodipine Besylate     REACTION: edema  . Codeine     headache  . Hydrocodone     hallucinations  . Lovastatin     REACTION: leg pain  . Metronidazole Swelling    And body aches  . Penicillins     Unknown reaction; has tolerated cephalosporins  . Sertraline Hcl     REACTION: caused insomnia  . Simvastatin     REACTION: non tolerant- leg pain  . Latex Rash  . Sulfonamide Derivatives Rash   Current Outpatient Prescriptions on File Prior to Visit  Medication Sig Dispense Refill  . azelastine (ASTELIN) 0.1 % nasal spray PLACE 2 SPRAYS INTO BOTH NOSTRILS 2 TIMES DAILY. 30 mL 11  . Cholecalciferol (VITAMIN D3) 2000 UNITS TABS Take 1 tablet by mouth daily.     . fexofenadine (ALLEGRA) 180 MG tablet Take 180 mg by mouth daily as needed (allergies).    . fluticasone (FLONASE) 50 MCG/ACT nasal spray PLACE 1 TO 2  SPRAYS IN EACH NOSTRIL DAILY. 16 g 11  . mometasone (ELOCON) 0.1 % cream Apply to affected area in small amount 3 times weekly as needed 45 g 3  .  omeprazole (PRILOSEC) 20 MG capsule TAKE 1 CAPSULE BY MOUTH 2 TIMES DAILY. (Patient taking differently: TAKE 1 CAPSULE BY MOUTH ONCE DAILY) 180 capsule 0  . traMADol (ULTRAM) 50 MG tablet TAKE 1 TABLET BY MOUTH EVERY 8 HOURS AS NEEDED FOR SEVERE PAIN. 30 tablet 1   No current facility-administered medications on file prior to visit.     Review of Systems Review of Systems  Constitutional: Negative for fever, appetite change,  and unexpected weight change. pos for chronic fatigue pos for general malaise and lack of motivation Eyes: Negative for pain and visual disturbance.  Respiratory: Negative for cough and shortness of breath.   Cardiovascular: Negative for cp or palpitations    Gastrointestinal: Negative for nausea, diarrhea and constipation.  Genitourinary: Negative for urgency and frequency MSK pos for severe foot pain and chronic back pain .  Skin: Negative for pallor or rash   Neurological: Negative for weakness, light-headedness, numbness and headaches.  Hematological: Negative for adenopathy. Does not bruise/bleed easily.  Psychiatric/Behavioral: pos for anxious and depressed mood with negative outlook and some irritability , neg for SI        Objective:   Physical Exam  Constitutional: She appears well-developed and well-nourished. No distress.  Frail appearing elderly female   HENT:  Head: Normocephalic and atraumatic.  Mouth/Throat: Oropharynx is clear and moist.  Eyes: Conjunctivae and EOM are normal. Pupils are equal, round, and reactive to light. No scleral icterus.  Neck: Normal range of motion. Neck supple. No JVD present. Carotid bruit is not present. No thyromegaly present.  Cardiovascular: Normal rate, regular rhythm, normal heart sounds and intact distal pulses.  Exam reveals no gallop.   Pulmonary/Chest: Effort normal  and breath sounds normal. No respiratory distress. She has no wheezes. She has no rales.  No crackles  Abdominal: Soft. Bowel sounds are normal. She exhibits no distension, no abdominal bruit and no mass. There is no tenderness.  No suprapubic tenderness or fullness    Musculoskeletal: She exhibits tenderness. She exhibits no edema.  Severe kyphosis   bilat feet- thickened nails and callouses   R foot- 2nd toe overlaps the first with large callous on top and also under the 2nd metatarsal     Lymphadenopathy:    She has no cervical adenopathy.  Neurological: She is alert. She has normal reflexes. She displays no tremor. No cranial nerve deficit. She exhibits normal muscle tone.  Skin: Skin is warm and dry. No rash noted.  Thickened nails  See MSK exam  Psychiatric: Her speech is normal and behavior is normal. Thought content normal. Her mood appears anxious. Her affect is not blunt and not labile. Thought content is not paranoid. She exhibits a depressed mood. She expresses no homicidal and no suicidal ideation.  Pt voices general discontent and frustration  Negative phrases but no SI Supportive daughter present            Assessment & Plan:   Problem List Items Addressed This Visit      Other   Anxiety and depression    Hard to assess/ pt has difficulty answering questions  She thinks the lexapro may cause drowsiness but unsure  Declines counseling Enc a hobby/ service project/ getting out/socializing Very negative attitude in general  Will inc lexapro to 20 mg daily and update if intol side eff or more dep/anx Discussed expectations of SSRI medication including time to effectiveness and mechanism of action, also poss of side effects (early and late)- including mental fuzziness,  weight or appetite change, nausea and poss of worse dep or anxiety (even suicidal thoughts)  Pt voiced understanding and will stop med and update if this occurs   f/u planned 6-8 weeks Family will  help her work on other projects as well      Dysuria    Urine send for cx  Strongly suspect this is due to concentrated urine in the setting of low fluid intake (1 glass of water daily and some coke)  Disc need for 64 oz of fluids daily/mostly water for urinary and kidney health        Relevant Orders   Urine culture   Fall from bed    Pt did get a bed hand rail that keeps her from rolling out and assists getting up  Px written for her med supply to see if ins would cover this       Foot pain, bilateral    From thickened nails/callouses/degenerative change  R foot- 2nd toe completely overlaps her first  Pt states pain from all of these problems keeps her from being active ([facility]) She needs a new podiatrist covered under her plan for nail and skin care She desires ortho ref for the hammer toe  Referrals done  Perhaps some relief would enable her to become more independent       Relevant Orders   Ambulatory referral to Orthopedic Surgery   Ambulatory referral to Podiatry   Urinary frequency    ua with tr wbc/rbc Sent for culture  Also urine is concentrated- disc imp of more fluids - rev with pt and her daughter       Other Visit Diagnoses    Urinary urgency    -  Primary   Relevant Orders   POCT Urinalysis Dipstick (Automated) (Completed)

## 2016-08-23 NOTE — Assessment & Plan Note (Signed)
Urine send for cx  Strongly suspect this is due to concentrated urine in the setting of low fluid intake (1 glass of water daily and some coke)  Disc need for 64 oz of fluids daily/mostly water for urinary and kidney health

## 2016-08-23 NOTE — Assessment & Plan Note (Signed)
Hard to assess/ pt has difficulty answering questions  She thinks the lexapro may cause drowsiness but unsure  Declines counseling Enc a hobby/ service project/ getting out/socializing Very negative attitude in general  Will inc lexapro to 20 mg daily and update if intol side eff or more dep/anx Discussed expectations of SSRI medication including time to effectiveness and mechanism of action, also poss of side effects (early and late)- including mental fuzziness, weight or appetite change, nausea and poss of worse dep or anxiety (even suicidal thoughts)  Pt voiced understanding and will stop med and update if this occurs   f/u planned 6-8 weeks Family will help her work on other projects as well

## 2016-08-24 LAB — URINE CULTURE

## 2016-09-06 ENCOUNTER — Inpatient Hospital Stay (HOSPITAL_COMMUNITY)
Admission: EM | Admit: 2016-09-06 | Discharge: 2016-09-09 | DRG: 689 | Disposition: A | Discharge status: Skilled Nursing Facility | Payer: PPO | Attending: Internal Medicine | Admitting: Internal Medicine

## 2016-09-06 ENCOUNTER — Encounter (HOSPITAL_COMMUNITY): Payer: Self-pay

## 2016-09-06 ENCOUNTER — Emergency Department (HOSPITAL_COMMUNITY): Payer: PPO

## 2016-09-06 DIAGNOSIS — G9340 Encephalopathy, unspecified: Secondary | ICD-10-CM | POA: Diagnosis present

## 2016-09-06 DIAGNOSIS — Z7951 Long term (current) use of inhaled steroids: Secondary | ICD-10-CM

## 2016-09-06 DIAGNOSIS — E860 Dehydration: Secondary | ICD-10-CM | POA: Diagnosis not present

## 2016-09-06 DIAGNOSIS — I10 Essential (primary) hypertension: Secondary | ICD-10-CM | POA: Diagnosis not present

## 2016-09-06 DIAGNOSIS — R531 Weakness: Secondary | ICD-10-CM | POA: Diagnosis not present

## 2016-09-06 DIAGNOSIS — Z885 Allergy status to narcotic agent status: Secondary | ICD-10-CM

## 2016-09-06 DIAGNOSIS — N390 Urinary tract infection, site not specified: Secondary | ICD-10-CM | POA: Diagnosis present

## 2016-09-06 DIAGNOSIS — W19XXXA Unspecified fall, initial encounter: Secondary | ICD-10-CM | POA: Diagnosis present

## 2016-09-06 DIAGNOSIS — E878 Other disorders of electrolyte and fluid balance, not elsewhere classified: Secondary | ICD-10-CM | POA: Diagnosis present

## 2016-09-06 DIAGNOSIS — Z8249 Family history of ischemic heart disease and other diseases of the circulatory system: Secondary | ICD-10-CM

## 2016-09-06 DIAGNOSIS — Z887 Allergy status to serum and vaccine status: Secondary | ICD-10-CM

## 2016-09-06 DIAGNOSIS — M8580 Other specified disorders of bone density and structure, unspecified site: Secondary | ICD-10-CM | POA: Diagnosis not present

## 2016-09-06 DIAGNOSIS — E871 Hypo-osmolality and hyponatremia: Secondary | ICD-10-CM | POA: Diagnosis present

## 2016-09-06 DIAGNOSIS — F418 Other specified anxiety disorders: Secondary | ICD-10-CM

## 2016-09-06 DIAGNOSIS — Z1623 Resistance to quinolones and fluoroquinolones: Secondary | ICD-10-CM | POA: Diagnosis not present

## 2016-09-06 DIAGNOSIS — K219 Gastro-esophageal reflux disease without esophagitis: Secondary | ICD-10-CM | POA: Diagnosis not present

## 2016-09-06 DIAGNOSIS — R262 Difficulty in walking, not elsewhere classified: Secondary | ICD-10-CM | POA: Diagnosis not present

## 2016-09-06 DIAGNOSIS — R350 Frequency of micturition: Secondary | ICD-10-CM | POA: Diagnosis present

## 2016-09-06 DIAGNOSIS — Z66 Do not resuscitate: Secondary | ICD-10-CM | POA: Diagnosis not present

## 2016-09-06 DIAGNOSIS — E785 Hyperlipidemia, unspecified: Secondary | ICD-10-CM | POA: Diagnosis not present

## 2016-09-06 DIAGNOSIS — Z1611 Resistance to penicillins: Secondary | ICD-10-CM | POA: Diagnosis not present

## 2016-09-06 DIAGNOSIS — Z880 Allergy status to penicillin: Secondary | ICD-10-CM | POA: Diagnosis not present

## 2016-09-06 DIAGNOSIS — Z1629 Resistance to other single specified antibiotic: Secondary | ICD-10-CM | POA: Diagnosis present

## 2016-09-06 DIAGNOSIS — E876 Hypokalemia: Secondary | ICD-10-CM | POA: Diagnosis present

## 2016-09-06 DIAGNOSIS — Z888 Allergy status to other drugs, medicaments and biological substances status: Secondary | ICD-10-CM

## 2016-09-06 DIAGNOSIS — F419 Anxiety disorder, unspecified: Secondary | ICD-10-CM

## 2016-09-06 DIAGNOSIS — R410 Disorientation, unspecified: Secondary | ICD-10-CM | POA: Diagnosis not present

## 2016-09-06 DIAGNOSIS — Z91040 Latex allergy status: Secondary | ICD-10-CM

## 2016-09-06 DIAGNOSIS — E861 Hypovolemia: Secondary | ICD-10-CM | POA: Diagnosis not present

## 2016-09-06 DIAGNOSIS — G9341 Metabolic encephalopathy: Secondary | ICD-10-CM | POA: Diagnosis present

## 2016-09-06 DIAGNOSIS — F329 Major depressive disorder, single episode, unspecified: Secondary | ICD-10-CM | POA: Diagnosis present

## 2016-09-06 DIAGNOSIS — Z79899 Other long term (current) drug therapy: Secondary | ICD-10-CM

## 2016-09-06 DIAGNOSIS — B9620 Unspecified Escherichia coli [E. coli] as the cause of diseases classified elsewhere: Secondary | ICD-10-CM | POA: Diagnosis not present

## 2016-09-06 DIAGNOSIS — Z882 Allergy status to sulfonamides status: Secondary | ICD-10-CM | POA: Diagnosis not present

## 2016-09-06 DIAGNOSIS — S0990XA Unspecified injury of head, initial encounter: Secondary | ICD-10-CM | POA: Diagnosis not present

## 2016-09-06 LAB — URINALYSIS, ROUTINE W REFLEX MICROSCOPIC
Bilirubin Urine: NEGATIVE
Glucose, UA: NEGATIVE mg/dL
KETONES UR: 5 mg/dL — AB
Nitrite: NEGATIVE
PROTEIN: NEGATIVE mg/dL
Specific Gravity, Urine: 1.017 (ref 1.005–1.030)
pH: 5 (ref 5.0–8.0)

## 2016-09-06 LAB — BASIC METABOLIC PANEL
ANION GAP: 8 (ref 5–15)
BUN: 22 mg/dL — ABNORMAL HIGH (ref 6–20)
CHLORIDE: 99 mmol/L — AB (ref 101–111)
CO2: 25 mmol/L (ref 22–32)
Calcium: 9.4 mg/dL (ref 8.9–10.3)
Creatinine, Ser: 1.06 mg/dL — ABNORMAL HIGH (ref 0.44–1.00)
GFR calc Af Amer: 55 mL/min — ABNORMAL LOW (ref >60)
GFR calc non Af Amer: 47 mL/min — ABNORMAL LOW (ref >60)
Glucose, Bld: 109 mg/dL — ABNORMAL HIGH (ref 65–99)
Potassium: 3.5 mmol/L (ref 3.5–5.1)
Sodium: 132 mmol/L — ABNORMAL LOW (ref 135–145)

## 2016-09-06 LAB — HEPATIC FUNCTION PANEL
ALBUMIN: 4 g/dL (ref 3.5–5.0)
ALT: 23 U/L (ref 14–54)
AST: 37 U/L (ref 15–41)
Alkaline Phosphatase: 114 U/L (ref 38–126)
Bilirubin, Direct: <0.1 mg/dL — ABNORMAL LOW (ref 0.1–0.5)
Total Bilirubin: 0.4 mg/dL (ref 0.3–1.2)
Total Protein: 7.4 g/dL (ref 6.5–8.1)

## 2016-09-06 LAB — I-STAT TROPONIN, ED: Troponin i, poc: 0 ng/mL (ref 0.00–0.08)

## 2016-09-06 LAB — CBC
HCT: 39.3 % (ref 36.0–46.0)
HEMOGLOBIN: 13.3 g/dL (ref 12.0–15.0)
MCH: 29.6 pg (ref 26.0–34.0)
MCHC: 33.8 g/dL (ref 30.0–36.0)
MCV: 87.5 fL (ref 78.0–100.0)
Platelets: 260 10*3/uL (ref 150–400)
RBC: 4.49 MIL/uL (ref 3.87–5.11)
RDW: 14.1 % (ref 11.5–15.5)
WBC: 8.6 10*3/uL (ref 4.0–10.5)

## 2016-09-06 MED ORDER — DEXTROSE 5 % IV SOLN
1.0000 g | Freq: Once | INTRAVENOUS | Status: AC
  Administered 2016-09-06: 1 g via INTRAVENOUS
  Filled 2016-09-06: qty 10

## 2016-09-06 MED ORDER — SODIUM CHLORIDE 0.9 % IV BOLUS (SEPSIS)
500.0000 mL | Freq: Once | INTRAVENOUS | Status: AC
  Administered 2016-09-06: 500 mL via INTRAVENOUS

## 2016-09-06 NOTE — ED Notes (Signed)
Amber Rollins, NT provided patient a cup of ice water with permission from provider.

## 2016-09-06 NOTE — ED Notes (Signed)
Ambulated patient with walker, she walked with assistance but is fragile. If patient has to stay by herself, she will unsafe have a great chance of falling.

## 2016-09-06 NOTE — ED Notes (Signed)
Pt transported to CT ?

## 2016-09-06 NOTE — ED Triage Notes (Signed)
PT BROUGHT IN BY HER FAMILY FOR INCREASED WEAKNESS AND DISORIENTATION OVER THE PAST FEW WEEKS. FAMILY STS SHE LIVES ALONE AND SHOULD USE A WALKER TO GET AROUND. THE PT WAS UNABLE TO STAND YESTERDAY AND TODAY. PT WAS FOUND ON THE FLOOR BY HER FAMILY LAST NIGHT. DENIES HEAD INJURY OR LOC. PT [id number] IN TRIAGE.

## 2016-09-06 NOTE — ED Provider Notes (Signed)
[facility]-EMERGENCY DEPT Provider Note   CSN: [id number] Arrival date & time: 09/06/16  1604     History   Chief Complaint Chief Complaint  Patient presents with  . Weakness    HPI Amber Rollins is a 83 y.o. female.  HPI 83 year old female who lives alone. Brought in by family for fall today. Unwitnessed. Patient states she thinks she tripped while coming in the house. Signal her life alert necklace and family member arrived to help her get up off the floor. She denies any head injury or loss of consciousness. Denies neck pain. Family states patient has-been increasingly unsteady of the past couple weeks. She's had increased confusion and repetitiveness. She's had difficulty ambulating since her fall. Denies any pain currently. Has had urinary frequency without urgency, hematuria or dysuria. No fever or chills. Past Medical History:  Diagnosis Date  . Chronic foot pain    mortons neuroma  . Complication of anesthesia   . Cystocele 01/01   neg. sx  . fracture fibula ? 2003   left  . GERD (gastroesophageal reflux disease)   . Hemorrhoids   . Hyperlipemia 05/31/1992  . Hypertension 05/31/2000  . Menopausal symptoms    vasomotor symptoms-severe  . Osteoarthritis   . Osteopenia 11/29/1999  . Pedal edema    worsened by Norvasc  . Plantar fasciitis   . PONV (postoperative nausea and vomiting)     Patient Active Problem List   Diagnosis Date Noted  . Foot pain, bilateral 08/23/2016  . Fall from bed 07/26/2016  . Diarrhea 03/29/2016  . Hyponatremia 01/01/2016  . Acute encephalopathy 01/01/2016  . Routine general medical examination at a health care facility 11/10/2015  . Urinary frequency 10/23/2014  . Generalized weakness 10/23/2014  . Hammer toe 09/20/2014  . Herpetic dermatitis 08/09/2014  . Candidal intertrigo 02/11/2014  . Poor balance 04/11/2013  . Thoracic back pain 04/11/2013  . Urinary incontinence 12/27/2012  . Depression 09/24/2012  . Thoracic kyphosis  09/24/2012  . Fall 08/09/2012  . Hemorrhoid 12/24/2011  . Dysuria 12/24/2011  . Low back pain 12/24/2011  . Hypokalemia 10/06/2010  . HEMATURIA UNSPECIFIED 10/02/2009  . NIGHT SWEATS 10/02/2009  . GERD 06/18/2009  . INCONTINENCE, URGE 06/18/2009  . FATIGUE 03/12/2009  . HYPOKALEMIA 10/08/2008  . MENOPAUSAL SYNDROME 01/22/2008  . EDEMA 07/19/2007  . Anxiety and depression 06/21/2007  . REACTION, ACUTE STRESS W/EMOTIONAL DSTURB 03/06/2007  . FOOT PAIN, CHRONIC 03/06/2007  . ALLERGIC RHINITIS, SEASONAL 09/12/2006  . IRRITABLE BOWEL SYNDROME 09/12/2006  . FIBROCYSTIC BREAST DISEASE 09/12/2006  . OSTEOARTHRITIS 09/12/2006  . HEMORRHOIDS, HX OF 09/12/2006  . Essential hypertension 05/31/2000  . Disorder of bone and cartilage 11/29/1999  . Hyperlipidemia 05/31/1992    Past Surgical History:  Procedure Laterality Date  . Anterior repari w//surg proc  06/30/99   RSO  . BLADDER SUSPENSION    . BLADDER TACK    . CATARACTS    . Foot problems     neuromas  . history of CT     mass right adnexa, U?S by GYN-observation  . KYPHOPLASTY N/A 09/04/2012   Procedure: T7, T8,T12 Kyphoplasty ;  Surgeon: Clydene Fake, MD;  Location: [facility];  Service: Neurosurgery;  Laterality: N/A;  thoracic seven,eight, and twelve   . PARTIAL HYSTERECTOMY  1970   endometriosis    OB History    No data available       Home Medications    Prior to Admission medications   Medication Sig Start Date  End Date Taking? Authorizing Provider  azelastine (ASTELIN) 0.1 % nasal spray PLACE 2 SPRAYS INTO BOTH NOSTRILS 2 TIMES DAILY. Patient taking differently: PLACE 2 SPRAYS INTO BOTH NOSTRILS 2 TIMES DAILY AS NEEDED FOR ALLERGIES 02/27/16  Yes [name], MD  Cholecalciferol (VITAMIN D3) 2000 UNITS TABS Take 1 tablet by mouth daily.    Yes Historical Provider, MD  diphenhydrAMINE (BENADRYL) 25 mg capsule Take 25 mg by mouth daily.   Yes Historical Provider, MD  escitalopram (LEXAPRO) 20 MG tablet Take 1  tablet (20 mg total) by mouth daily. 08/23/16  Yes Marne A Tower, MD  fluticasone (FLONASE) 50 MCG/ACT nasal spray PLACE 1 TO 2 SPRAYS IN EACH NOSTRIL DAILY. Patient taking differently: PLACE 1 TO 2 SPRAYS IN EACH NOSTRIL DAILY AT BEDTIME 02/27/16  Yes Marne A Tower, MD  mometasone (ELOCON) 0.1 % cream Apply to affected area in small amount 3 times weekly as needed Patient taking differently: Apply 1 application topically 3 (three) times daily as needed (for itching).  11/10/15  Yes [name], MD  omeprazole (PRILOSEC) 20 MG capsule TAKE 1 CAPSULE BY MOUTH 2 TIMES DAILY. Patient taking differently: TAKE 1 CAPSULE BY MOUTH ONCE DAILY 06/21/16  Yes [name], MD  traMADol (ULTRAM) 50 MG tablet TAKE 1 TABLET BY MOUTH EVERY 8 HOURS AS NEEDED FOR SEVERE PAIN. 03/30/16  Yes [name], MD    Family History Family History  Problem Relation Age of Onset  . Diabetes Brother   . Hypertension Other   . Diabetes Mother   . Heart disease Mother     pacemaker  . Hypertension Mother   . COPD Father   . Diabetes Sister   . Mental illness Son     anxiety-mental health problems  . Diabetes Brother   . Diabetes Sister   . Diabetes Sister     Social History Social History  Substance Use Topics  . Smoking status: Never Smoker  . Smokeless tobacco: Never Used  . Alcohol use No     Allergies   Tdap [diphth-acell pertussis-tetanus]; Amlodipine besylate; Codeine; Hydrocodone; Lovastatin; Metronidazole; Penicillins; Sertraline hcl; Simvastatin; Latex; and Sulfonamide derivatives   Review of Systems Review of Systems  Constitutional: Negative for chills and fever.  Eyes: Negative for visual disturbance.  Respiratory: Negative for cough and shortness of breath.   Cardiovascular: Negative for chest pain and leg swelling.  Gastrointestinal: Negative for abdominal pain, constipation, diarrhea, nausea and vomiting.  Genitourinary: Positive for frequency. Negative for dysuria, flank pain and  hematuria.  Musculoskeletal: Negative for back pain and neck pain.  Skin: Negative for rash and wound.  Neurological: Positive for weakness. Negative for dizziness, syncope, light-headedness, numbness and headaches.  Psychiatric/Behavioral: Positive for confusion.  All other systems reviewed and are negative.    Physical Exam Updated Vital Signs BP (!) 176/94 (BP Location: Left Arm)   Pulse 75   Temp 98.5 F (36.9 C) (Oral)   Resp (!) 24   Ht  (1.499 m)   Wt 125 lb (56.7 kg)   SpO2 95%   BMI 25.25 kg/m   Physical Exam  Constitutional: She is oriented to person, place, and time. She appears well-developed and well-nourished. No distress.  HENT:  Head: Normocephalic and atraumatic.  Mouth/Throat: Oropharynx is clear and moist. No oropharyngeal exudate.  Eyes: EOM are normal. Pupils are equal, round, and reactive to light.  Neck: Normal range of motion. Neck supple.  No posterior midline cervical tenderness to palpation.  Cardiovascular: Normal rate and regular rhythm.  Exam reveals no gallop and no friction rub.   No murmur heard. Pulmonary/Chest: Effort normal and breath sounds normal. No respiratory distress. She has no wheezes. She has no rales. She exhibits no tenderness.  Abdominal: Soft. Bowel sounds are normal. There is no tenderness. There is no rebound and no guarding.  Musculoskeletal: Normal range of motion. She exhibits no edema or tenderness.  Pelvis is stable. Distal pulses are 2+. No lower extremity swelling or asymmetry.  Neurological: She is alert and oriented to person, place, and time.  Patient is mildly confused. 5/5 motor in all extremities. Bilateral finger to nose intact. Sensation to light touch is grossly intact. Tremor present which family states is chronic though worsened the last couple of weeks.  Skin: Skin is warm and dry. Capillary refill takes less than 2 seconds. No rash noted. No erythema.  Psychiatric: She has a normal mood and affect. Her  behavior is normal.  Nursing note and vitals reviewed.    ED Treatments / Results  Labs (all labs ordered are listed, but only abnormal results are displayed) Labs Reviewed  BASIC METABOLIC PANEL - Abnormal; Notable for the following:       Result Value   Sodium 132 (*)    Chloride 99 (*)    Glucose, Bld 109 (*)    BUN 22 (*)    Creatinine, Ser 1.06 (*)    GFR calc non Af Amer 47 (*)    GFR calc Af Amer 55 (*)    All other components within normal limits  URINALYSIS, ROUTINE W REFLEX MICROSCOPIC - Abnormal; Notable for the following:    APPearance HAZY (*)    Hgb urine dipstick MODERATE (*)    Ketones, ur 5 (*)    Leukocytes, UA MODERATE (*)    Bacteria, UA RARE (*)    Squamous Epithelial / LPF 0-5 (*)    All other components within normal limits  HEPATIC FUNCTION PANEL - Abnormal; Notable for the following:    Bilirubin, Direct <0.1 (*)    All other components within normal limits  URINE CULTURE  CBC  I-STAT TROPOININ, ED    EKG  EKG Interpretation  Date/Time:  Monday September 06 2016 19:06:37 EDT Ventricular Rate:  75 PR Interval:    QRS Duration: 87 QT Interval:  397 QTC Calculation: 444 R Axis:   15 Text Interpretation:  Sinus rhythm Consider left atrial enlargement Confirmed by Ranae Palms  MD, Kiearra Oyervides ([id number]) on 09/06/2016 7:17:23 PM       Radiology Ct Head Wo Contrast  Result Date: 09/06/2016 CLINICAL DATA:  Initial evaluation for acute trauma, fall. EXAM: CT HEAD WITHOUT CONTRAST TECHNIQUE: Contiguous axial images were obtained from the base of the skull through the vertex without intravenous contrast. COMPARISON:  Prior CT from 01/01/2016. FINDINGS: Brain: Generalized age-related cerebral atrophy. Fairly advanced chronic microvascular ischemic disease noted, similar to previous. Remote lacunar infarct noted within the left basal ganglia. No acute intracranial hemorrhage. No evidence for acute large vessel territory infarct. No mass lesion, midline shift or mass  effect. No hydrocephalus. No extra-axial fluid collection. Vascular: No asymmetric hyperdense vessel. Scattered vascular calcifications noted within the carotid siphons and distal vertebral arteries. Skull: Scalp soft tissues demonstrate no acute abnormality. Question small left facial contusion. Calvarium intact. Sinuses/Orbits: Globes and orbital soft tissues within normal limits. Patient status post lens extraction bilaterally. Paranasal sinuses are clear. No mastoid effusion. Other: None. IMPRESSION: 1. No acute intracranial process identified.  2. Question small left facial contusion. Correlation with physical exam recommended. 3. Stable atrophy with advanced chronic microvascular ischemic disease. Electronically Signed   By: Rise Mu M.D.   On: 09/06/2016 19:32    Procedures Procedures (including critical care time)  Medications Ordered in ED Medications  cefTRIAXone (ROCEPHIN) 1 g in dextrose 5 % 50 mL IVPB (0 g Intravenous Stopped 09/06/16 2135)  sodium chloride 0.9 % bolus 500 mL (0 mLs Intravenous Stopped 09/06/16 2304)     Initial Impression / Assessment and Plan / ED Course  I have reviewed the triage vital signs and the nursing notes.  Pertinent labs & imaging results that were available during my care of the patient were reviewed by me and considered in my medical decision making (see chart for details).     Was unable to ambulate by herself. Required assistance. Unsteady. Given Rocephin for UTI. Will admit to hospitalist.  Final Clinical Impressions(s) / ED Diagnoses   Final diagnoses:  Generalized weakness  Lower urinary tract infectious disease    New Prescriptions New Prescriptions   No medications on file     Loren Racer, MD 09/06/16 2310

## 2016-09-07 ENCOUNTER — Encounter (HOSPITAL_COMMUNITY): Payer: Self-pay | Admitting: Family Medicine

## 2016-09-07 DIAGNOSIS — M6281 Muscle weakness (generalized): Secondary | ICD-10-CM | POA: Diagnosis not present

## 2016-09-07 DIAGNOSIS — E876 Hypokalemia: Secondary | ICD-10-CM

## 2016-09-07 DIAGNOSIS — M79673 Pain in unspecified foot: Secondary | ICD-10-CM | POA: Diagnosis not present

## 2016-09-07 DIAGNOSIS — Z880 Allergy status to penicillin: Secondary | ICD-10-CM | POA: Diagnosis not present

## 2016-09-07 DIAGNOSIS — E860 Dehydration: Secondary | ICD-10-CM | POA: Diagnosis present

## 2016-09-07 DIAGNOSIS — E871 Hypo-osmolality and hyponatremia: Secondary | ICD-10-CM | POA: Diagnosis not present

## 2016-09-07 DIAGNOSIS — E785 Hyperlipidemia, unspecified: Secondary | ICD-10-CM | POA: Diagnosis present

## 2016-09-07 DIAGNOSIS — W19XXXA Unspecified fall, initial encounter: Secondary | ICD-10-CM | POA: Diagnosis present

## 2016-09-07 DIAGNOSIS — R262 Difficulty in walking, not elsewhere classified: Secondary | ICD-10-CM | POA: Diagnosis present

## 2016-09-07 DIAGNOSIS — Z882 Allergy status to sulfonamides status: Secondary | ICD-10-CM | POA: Diagnosis not present

## 2016-09-07 DIAGNOSIS — N390 Urinary tract infection, site not specified: Secondary | ICD-10-CM | POA: Diagnosis not present

## 2016-09-07 DIAGNOSIS — Z887 Allergy status to serum and vaccine status: Secondary | ICD-10-CM | POA: Diagnosis not present

## 2016-09-07 DIAGNOSIS — G9340 Encephalopathy, unspecified: Secondary | ICD-10-CM | POA: Diagnosis not present

## 2016-09-07 DIAGNOSIS — E861 Hypovolemia: Secondary | ICD-10-CM | POA: Diagnosis present

## 2016-09-07 DIAGNOSIS — I10 Essential (primary) hypertension: Secondary | ICD-10-CM | POA: Diagnosis not present

## 2016-09-07 DIAGNOSIS — Z1611 Resistance to penicillins: Secondary | ICD-10-CM | POA: Diagnosis present

## 2016-09-07 DIAGNOSIS — Z66 Do not resuscitate: Secondary | ICD-10-CM | POA: Diagnosis present

## 2016-09-07 DIAGNOSIS — R410 Disorientation, unspecified: Secondary | ICD-10-CM | POA: Diagnosis present

## 2016-09-07 DIAGNOSIS — B9620 Unspecified Escherichia coli [E. coli] as the cause of diseases classified elsewhere: Secondary | ICD-10-CM | POA: Diagnosis present

## 2016-09-07 DIAGNOSIS — F418 Other specified anxiety disorders: Secondary | ICD-10-CM | POA: Diagnosis not present

## 2016-09-07 DIAGNOSIS — K219 Gastro-esophageal reflux disease without esophagitis: Secondary | ICD-10-CM | POA: Diagnosis not present

## 2016-09-07 DIAGNOSIS — R350 Frequency of micturition: Secondary | ICD-10-CM | POA: Diagnosis present

## 2016-09-07 DIAGNOSIS — E878 Other disorders of electrolyte and fluid balance, not elsewhere classified: Secondary | ICD-10-CM | POA: Diagnosis present

## 2016-09-07 DIAGNOSIS — Z8249 Family history of ischemic heart disease and other diseases of the circulatory system: Secondary | ICD-10-CM | POA: Diagnosis not present

## 2016-09-07 DIAGNOSIS — M8580 Other specified disorders of bone density and structure, unspecified site: Secondary | ICD-10-CM | POA: Diagnosis present

## 2016-09-07 DIAGNOSIS — Z1629 Resistance to other single specified antibiotic: Secondary | ICD-10-CM | POA: Diagnosis present

## 2016-09-07 DIAGNOSIS — Z885 Allergy status to narcotic agent status: Secondary | ICD-10-CM | POA: Diagnosis not present

## 2016-09-07 DIAGNOSIS — G9341 Metabolic encephalopathy: Secondary | ICD-10-CM | POA: Diagnosis not present

## 2016-09-07 DIAGNOSIS — Z1623 Resistance to quinolones and fluoroquinolones: Secondary | ICD-10-CM | POA: Diagnosis present

## 2016-09-07 DIAGNOSIS — Z5189 Encounter for other specified aftercare: Secondary | ICD-10-CM | POA: Diagnosis not present

## 2016-09-07 DIAGNOSIS — R531 Weakness: Secondary | ICD-10-CM | POA: Diagnosis not present

## 2016-09-07 LAB — BASIC METABOLIC PANEL
Anion gap: 8 (ref 5–15)
BUN: 14 mg/dL (ref 6–20)
CALCIUM: 8.7 mg/dL — AB (ref 8.9–10.3)
CO2: 27 mmol/L (ref 22–32)
CREATININE: 0.88 mg/dL (ref 0.44–1.00)
Chloride: 101 mmol/L (ref 101–111)
GFR, EST NON AFRICAN AMERICAN: 59 mL/min — AB (ref >60)
Glucose, Bld: 109 mg/dL — ABNORMAL HIGH (ref 65–99)
Potassium: 3.1 mmol/L — ABNORMAL LOW (ref 3.5–5.1)
SODIUM: 136 mmol/L (ref 135–145)

## 2016-09-07 LAB — VITAMIN B12: VITAMIN B 12: 317 pg/mL (ref 180–914)

## 2016-09-07 LAB — TSH: TSH: 4.182 u[IU]/mL (ref 0.350–4.500)

## 2016-09-07 LAB — RPR: RPR Ser Ql: NONREACTIVE

## 2016-09-07 LAB — FOLATE: Folate: 26.5 ng/mL (ref >5.9)

## 2016-09-07 LAB — HIV ANTIBODY (ROUTINE TESTING W REFLEX): HIV Screen 4th Generation wRfx: NONREACTIVE

## 2016-09-07 MED ORDER — ESCITALOPRAM OXALATE 10 MG PO TABS
20.0000 mg | ORAL_TABLET | Freq: Every day | ORAL | Status: DC
  Administered 2016-09-07 – 2016-09-09 (×3): 20 mg via ORAL
  Filled 2016-09-07 (×3): qty 2

## 2016-09-07 MED ORDER — POLYETHYLENE GLYCOL 3350 17 G PO PACK: 17.0000 g | PACK | Freq: Every day | ORAL | Status: DC | PRN

## 2016-09-07 MED ORDER — ONDANSETRON HCL 4 MG PO TABS: 4.0000 mg | ORAL_TABLET | Freq: Four times a day (QID) | ORAL | Status: DC | PRN

## 2016-09-07 MED ORDER — VITAMIN D3 25 MCG (1000 UNIT) PO TABS
2000.0000 [IU] | ORAL_TABLET | Freq: Every day | ORAL | Status: DC
  Administered 2016-09-07 – 2016-09-09 (×3): 2000 [IU] via ORAL
  Filled 2016-09-07 (×3): qty 2

## 2016-09-07 MED ORDER — ONDANSETRON HCL 4 MG/2ML IJ SOLN: 4.0000 mg | Freq: Four times a day (QID) | INTRAMUSCULAR | Status: DC | PRN

## 2016-09-07 MED ORDER — ACETAMINOPHEN 325 MG PO TABS
650.0000 mg | ORAL_TABLET | Freq: Four times a day (QID) | ORAL | Status: DC | PRN
  Administered 2016-09-08: 650 mg via ORAL
  Filled 2016-09-07: qty 2

## 2016-09-07 MED ORDER — SODIUM CHLORIDE 0.9 % IV SOLN
INTRAVENOUS | Status: AC
  Administered 2016-09-07: 03:00:00 via INTRAVENOUS

## 2016-09-07 MED ORDER — POTASSIUM CHLORIDE CRYS ER 20 MEQ PO TBCR
40.0000 meq | EXTENDED_RELEASE_TABLET | ORAL | Status: AC
  Administered 2016-09-07 (×2): 40 meq via ORAL
  Filled 2016-09-07 (×2): qty 2

## 2016-09-07 MED ORDER — PANTOPRAZOLE SODIUM 40 MG PO TBEC
40.0000 mg | DELAYED_RELEASE_TABLET | Freq: Every day | ORAL | Status: DC
  Administered 2016-09-07 – 2016-09-09 (×3): 40 mg via ORAL
  Filled 2016-09-07 (×4): qty 1

## 2016-09-07 MED ORDER — DEXTROSE 5 % IV SOLN
1.0000 g | INTRAVENOUS | Status: DC
  Administered 2016-09-07 – 2016-09-08 (×2): 1 g via INTRAVENOUS
  Filled 2016-09-07 (×2): qty 10

## 2016-09-07 MED ORDER — SODIUM CHLORIDE 0.9 % IV SOLN
INTRAVENOUS | Status: DC
  Administered 2016-09-07: 22:00:00 via INTRAVENOUS

## 2016-09-07 MED ORDER — ENOXAPARIN SODIUM 40 MG/0.4ML ~~LOC~~ SOLN
40.0000 mg | SUBCUTANEOUS | Status: DC
  Administered 2016-09-07 – 2016-09-09 (×3): 40 mg via SUBCUTANEOUS
  Filled 2016-09-07 (×3): qty 0.4

## 2016-09-07 MED ORDER — SODIUM CHLORIDE 0.9% FLUSH
3.0000 mL | Freq: Two times a day (BID) | INTRAVENOUS | Status: DC
  Administered 2016-09-07 – 2016-09-08 (×4): 3 mL via INTRAVENOUS

## 2016-09-07 MED ORDER — ACETAMINOPHEN 650 MG RE SUPP: 650.0000 mg | Freq: Four times a day (QID) | RECTAL | Status: DC | PRN

## 2016-09-07 MED ORDER — FLUTICASONE PROPIONATE 50 MCG/ACT NA SUSP
1.0000 | Freq: Every day | NASAL | Status: DC
  Administered 2016-09-07 – 2016-09-09 (×3): 1 via NASAL
  Filled 2016-09-07: qty 16

## 2016-09-07 MED ORDER — TRAMADOL HCL 50 MG PO TABS: 50.0000 mg | ORAL_TABLET | Freq: Four times a day (QID) | ORAL | Status: DC | PRN

## 2016-09-07 MED ORDER — BISACODYL 5 MG PO TBEC: 5.0000 mg | DELAYED_RELEASE_TABLET | Freq: Every day | ORAL | Status: DC | PRN

## 2016-09-07 NOTE — ED Notes (Signed)
Provided patient a [place] sandwich and ice water with permission from Hospitalist.

## 2016-09-07 NOTE — Evaluation (Signed)
Physical Therapy Evaluation Patient Details Name: Amber Rollins MRN: [id number] DOB: [birth year] Today's Date: 09/07/2016   History of Present Illness  Pt admitted with weakness and diorientation s/p fall at home.  Pt with hx of kyphoplasty at T-7, T-8, and T 12 in 2014 s/p fall through attic floor.  Clinical Impression  Pt admitted as above and presenting with functional mobility limitations 2* generalized weakness and balance deficits.  Pt hopes to progress to dc home but recognizes that this may be unsafe dependent on acute stay progress and level of assist available at home.    Follow Up Recommendations Home health PT;SNF;Supervision/Assistance - 24 hour (dependent on assist at home and acute stay progress)    Equipment Recommendations  None recommended by PT    Recommendations for Other Services OT consult     Precautions / Restrictions Precautions Precautions: Fall Restrictions Weight Bearing Restrictions: No      Mobility  Bed Mobility Overal bed mobility: Modified Independent             General bed mobility comments: Pt to EOB unassisted but with increased time  Transfers Overall transfer level: Needs assistance Equipment used: Rolling walker (2 wheeled) Transfers: Sit to/from Stand Sit to Stand: Min assist         General transfer comment: cues for safe transition position and use of UEs to self assist  Ambulation/Gait Ambulation/Gait assistance: Min assist Ambulation Distance (Feet): 100 Feet Assistive device: Rolling walker (2 wheeled) Gait Pattern/deviations: Step-through pattern;Decreased step length - right;Decreased step length - left;Shuffle;Trunk flexed Gait velocity: decr Gait velocity interpretation: Below normal speed for age/gender General Gait Details: cues for posture and position from RW.  Physical assist to manage RW and to maintain balance  Stairs            Wheelchair Mobility    Modified Rankin (Stroke Patients  Only)       Balance Overall balance assessment: Needs assistance Sitting-balance support: No upper extremity supported;Feet supported Sitting balance-Leahy Scale: Good     Standing balance support: No upper extremity supported Standing balance-Leahy Scale: Poor Standing balance comment: posterior balance loss                             Pertinent Vitals/Pain Pain Assessment: 0-10 Pain Score: 4  Pain Location: R side Pain Descriptors / Indicators: Sore Pain Intervention(s): Limited activity within patient's tolerance;Monitored during session    Home Living Family/patient expects to be discharged to:: Private residence Living Arrangements: Alone Available Help at Discharge: Available PRN/intermittently;Family Type of Home: House Home Access: Stairs to enter   [employer] of Steps: 1-2 Home Layout: One level Home Equipment: Walker - 2 wheels;Bedside commode;Shower seat      Prior Function Level of Independence: Independent               Hand Dominance        Extremity/Trunk Assessment   Upper Extremity Assessment Upper Extremity Assessment: Generalized weakness    Lower Extremity Assessment Lower Extremity Assessment: Generalized weakness    Cervical / Trunk Assessment Cervical / Trunk Assessment: Kyphotic  Communication   Communication: No difficulties  Cognition Arousal/Alertness: Awake/alert Behavior During Therapy: WFL for tasks assessed/performed Overall Cognitive Status: Within Functional Limits for tasks assessed  General Comments      Exercises     Assessment/Plan    PT Assessment Patient needs continued PT services  PT Problem List Decreased strength;Decreased activity tolerance;Decreased balance;Decreased mobility;Decreased knowledge of use of DME;Pain       PT Treatment Interventions DME instruction;Gait training;Stair training;Functional mobility  training;Therapeutic activities;Therapeutic exercise;Patient/family education    PT Goals (Current goals can be found in the Care Plan section)  Acute Rehab PT Goals Patient Stated Goal: Home PT Goal Formulation: With patient Time For Goal Achievement: 09/18/16 Potential to Achieve Goals: Good    Frequency Min 3X/week   Barriers to discharge Decreased caregiver support Lives alone    Co-evaluation               End of Session Equipment Utilized During Treatment: Gait belt Activity Tolerance: Patient tolerated treatment well;Patient limited by fatigue Patient left: in chair;with call bell/phone within reach;with chair alarm set Nurse Communication: Mobility status PT Visit Diagnosis: Unsteadiness on feet (R26.81);Muscle weakness (generalized) (M62.81)    Time: 1610-9604 PT Time Calculation (min) (ACUTE ONLY): 27 min   Charges:   PT Evaluation $PT Eval Low Complexity: 1 Procedure PT Treatments $Gait Training: 8-22 mins   PT G Codes:   PT G-Codes **NOT FOR INPATIENT CLASS** Functional Assessment Tool Used: Clinical judgement Functional Limitation: Mobility: Walking and moving around Mobility: Walking and Moving Around Current Status (V4098): At least 20 percent but less than 40 percent impaired, limited or restricted Mobility: Walking and Moving Around Goal Status [phone]): At least 1 percent but less than 20 percent impaired, limited or restricted    Pg [phone]  Adonia Porada 09/07/2016, 12:53 PM

## 2016-09-07 NOTE — ED Notes (Signed)
Changed patient gown and placed clean incontinence pad underneath patient. Also, Truddie Coco repositioned patient in Doctor, general practice.

## 2016-09-07 NOTE — ED Notes (Signed)
Gave report to Leslie, RVerlon Auor assigned room [phone].

## 2016-09-07 NOTE — Progress Notes (Addendum)
[facility] PROGRESS NOTE  Amber Rollins ZOX:[id number] DOB: November 13, 1930 DOA: 09/06/2016 PCP: Roxy Manns, MD  Interim summary and HPI 83 y.o. female with medical history significant for chronic foot pain, hypertension, depression with anxiety, and GERD who presents to the emergency department for evaluation of confusion and generalized weakness. Found to have hyponatremia, UA suggesting UTI and with physical deconditioning and confusion.  Assessment/Plan: 1. Acute encephalopathy  - Pt presents with increasing confusion in recent days; most likely from UTI  - No focal neurologic deficit identified and head CT negative for acute intracranial abnormality  - ED workup with mild hyponatremia and UA suggestive of possible UTI  - will continue tx for infection, continue supportive care and IVF's - sodium repleted, B12, TSH and folate acid WNL - RPR neg  2. UTI  - Urinary urgency and frequency reported, suprapubic tenderness noted on admission - UA suggestive of possible infection  - will continue empiric rocephin and follow cultures - continue supportive care and IVF's  3. Hyponatremia  - Serum sodium is 132 on admission in setting of hypovolemia and dehydration. - resolved after IVF's - will follow electrolyte trend  4. Depression with anxiety  - no hallucinations - will Continue Lexapro    5. Generalized weakness  - Acute infection likely contributing, but family reports progressive weakness over several weeks  - No focal deficits identified and head CT negative for acute intracranial abnormality  - B12 and folate levels WNL -will follow PT evaluation and rec's.  6. Hypokalemia - will replete as needed follow trend - will check Mg level  Code Status: Full Family Communication: no family at bedside  Disposition Plan: to be determined. continue IV antibiotics, follow urine culture and continue supportive care.   Consultants:  None   Procedures:  See below  for x-ray reports   Antibiotics:  Rocephin 09/06/16  HPI/Subjective: Afebrile, no vomiting, denies CP and SOB. Still nauseated and feeling weak.  Objective: Vitals:   09/07/16 0259 09/07/16 1415  BP: (!) 166/68 (!) 120/55  Pulse: 73 91  Resp: 16 18  Temp: 98.5 F (36.9 C) 98.2 F (36.8 C)    Intake/Output Summary (Last 24 hours) at 09/07/16 1652 Last data filed at 09/07/16 1415  Gross per 24 hour  Intake           878.75 ml  Output                0 ml  Net           878.75 ml   Filed Weights   09/06/16 1650 09/07/16 0308  Weight: 56.7 kg (125 lb) 60.1 kg (132 lb 7.9 oz)    Exam:   General:  Afebrile, feeling better overall, but still complaining of weakness and nausea; endorses no vomiting. Oriented to person and place, insight was slightly off.  Cardiovascular: S1 and S2, no rubs or gallops  Respiratory: CTA bilaterally  Abdomen: soft, NT, ND, positive BS  Musculoskeletal: no edema or cyanosis   Data Reviewed: Basic Metabolic Panel:  Recent Labs Lab 09/06/16 1656 09/07/16 0608  NA 132* 136  K 3.5 3.1*  CL 99* 101  CO2 25 27  GLUCOSE 109* 109*  BUN 22* 14  CREATININE 1.06* 0.88  CALCIUM 9.4 8.7*   Liver Function Tests:  Recent Labs Lab 09/06/16 1901  AST 37  ALT 23  ALKPHOS 114  BILITOT 0.4  PROT 7.4  ALBUMIN 4.0   CBC:  Recent Labs Lab 09/06/16 1656  WBC 8.6  HGB 13.3  HCT 39.3  MCV 87.5  PLT 260    Studies: Ct Head Wo Contrast  Result Date: 09/06/2016 CLINICAL DATA:  Initial evaluation for acute trauma, fall. EXAM: CT HEAD WITHOUT CONTRAST TECHNIQUE: Contiguous axial images were obtained from the base of the skull through the vertex without intravenous contrast. COMPARISON:  Prior CT from 01/01/2016. FINDINGS: Brain: Generalized age-related cerebral atrophy. Fairly advanced chronic microvascular ischemic disease noted, similar to previous. Remote lacunar infarct noted within the left basal ganglia. No acute intracranial  hemorrhage. No evidence for acute large vessel territory infarct. No mass lesion, midline shift or mass effect. No hydrocephalus. No extra-axial fluid collection. Vascular: No asymmetric hyperdense vessel. Scattered vascular calcifications noted within the carotid siphons and distal vertebral arteries. Skull: Scalp soft tissues demonstrate no acute abnormality. Question small left facial contusion. Calvarium intact. Sinuses/Orbits: Globes and orbital soft tissues within normal limits. Patient status post lens extraction bilaterally. Paranasal sinuses are clear. No mastoid effusion. Other: None. IMPRESSION: 1. No acute intracranial process identified. 2. Question small left facial contusion. Correlation with physical exam recommended. 3. Stable atrophy with advanced chronic microvascular ischemic disease. Electronically Signed   By: Rise Mu M.D.   On: 09/06/2016 19:32    Scheduled Meds: . cefTRIAXone (ROCEPHIN)  IV  1 g Intravenous Q24H  . cholecalciferol  2,000 Units Oral Daily  . enoxaparin (LOVENOX) injection  40 mg Subcutaneous Q24H  . escitalopram  20 mg Oral Daily  . fluticasone  1 spray Each Nare Daily  . pantoprazole  40 mg Oral Daily  . potassium chloride  40 mEq Oral Q4H  . sodium chloride flush  3 mL Intravenous Q12H   Continuous Infusions:  Principal Problem:   Acute encephalopathy Active Problems:   Anxiety and depression   Essential hypertension   Acute lower UTI   Generalized weakness   Hyponatremia    Time spent: 25 minutes    Vassie Loll  [facility] Pager [phone]. If 7PM-7AM, please contact night-coverage at [email or web address], password [facility] 09/07/2016, 4:52 PM  LOS: 0 days

## 2016-09-07 NOTE — Progress Notes (Signed)
Patient had run of svt.  Dr. Gwenlyn Perking notified, with plans to replete electrolytes.

## 2016-09-07 NOTE — H&P (Addendum)
History and Physical    Viriginia Amendola ZOX:[id number] DOB: Jul 20, 1930 DOA: 09/06/2016  PCP: Roxy Manns, MD   Patient coming from: Home  Chief Complaint: Confusion, gen weakness, urinary urgency and frequency   HPI: Amber Rollins is a 83 y.o. female with medical history significant for chronic foot pain, hypertension, depression with anxiety, and GERD who presents to the emergency department for evaluation of confusion and generalized weakness. Patient is accompanied by a family friend who assists with the history. Patient lives alone and is typically able to care for herself adequately, but has been noted by family and friends to be increasingly confused and generally weak over the past few days. They feel that the weakness is actually been developing over several weeks. Upon visiting her today, the patient was found on the floor, reportedly too weak to get up. She was helped up and was able to ambulate with minimal assistance. She denied any specific complaints but has been reportedly needing to urinate very frequently recently with only small volume voids. Patient denies any headache, change in vision or hearing, loss of coordination, or focal numbness or weakness. She denies chest pain, palpitations, dyspnea, or cough.  ED Course: Upon arrival to the ED, patient is found to be afebrile, saturating well on room air, and with vital signs stable. EKG features a normal sinus rhythm and noncontrast head CT is negative for acute intracranial abnormality. Chemistry panel reveals a mild hyponatremia and hypochloremia and CBC is unremarkable. Troponin is undetectable and urinalysis is suggestive of possible infection and notable for moderate hemoglobin. Urine was sent for culture, 500 mL normal saline was given, and the patient was started on empiric Rocephin. She remained hemodynamically stable in the ED and has not been an apparent respiratory distress. She'll be observed on the  medical-surgical unit for ongoing evaluation and management of acute encephalopathy and generalized weakness, likely secondary to UTI.  Review of Systems:  All other systems reviewed and apart from HPI, are negative.  Past Medical History:  Diagnosis Date  . Chronic foot pain    mortons neuroma  . Complication of anesthesia   . Cystocele 01/01   neg. sx  . fracture fibula ? 2003   left  . GERD (gastroesophageal reflux disease)   . Hemorrhoids   . Hyperlipemia 05/31/1992  . Hypertension 05/31/2000  . Menopausal symptoms    vasomotor symptoms-severe  . Osteoarthritis   . Osteopenia 11/29/1999  . Pedal edema    worsened by Norvasc  . Plantar fasciitis   . PONV (postoperative nausea and vomiting)     Past Surgical History:  Procedure Laterality Date  . Anterior repari w//surg proc  06/30/99   RSO  . BLADDER SUSPENSION    . BLADDER TACK    . CATARACTS    . Foot problems     neuromas  . history of CT     mass right adnexa, U?S by GYN-observation  . KYPHOPLASTY N/A 09/04/2012   Procedure: T7, T8,T12 Kyphoplasty ;  Surgeon: Clydene Fake, MD;  Location: [facility];  Service: Neurosurgery;  Laterality: N/A;  thoracic seven,eight, and twelve   . PARTIAL HYSTERECTOMY  1970   endometriosis     reports that she has never smoked. She has never used smokeless tobacco. She reports that she does not drink alcohol or use drugs.  Allergies  Allergen Reactions  . Tdap [Diphth-Acell Pertussis-Tetanus] Swelling  . Amlodipine Besylate     REACTION: edema  . Codeine  headache  . Hydrocodone     hallucinations  . Lovastatin     REACTION: leg pain  . Metronidazole Swelling    And body aches  . Penicillins Itching    Unknown reaction; has tolerated cephalosporins Has patient had a PCN reaction causing immediate rash, facial/tongue/throat swelling, SOB or lightheadedness with hypotension:unsure Has patient had a PCN reaction causing severe rash involving mucus membranes or skin  necrosis:unsure Has patient had a PCN reaction that required hospitalization:unsure Has patient had a PCN reaction occurring within the last 10 years:No If all of the above answers are "NO", then may proceed with Cephalosporin use.   . Sertraline Hcl     REACTION: caused insomnia  . Simvastatin     REACTION: non tolerant- leg pain  . Latex Rash  . Sulfonamide Derivatives Rash    Family History  Problem Relation Age of Onset  . Diabetes Brother   . Hypertension Other   . Diabetes Mother   . Heart disease Mother     pacemaker  . Hypertension Mother   . COPD Father   . Diabetes Sister   . Mental illness Son     anxiety-mental health problems  . Diabetes Brother   . Diabetes Sister   . Diabetes Sister      Prior to Admission medications   Medication Sig Start Date End Date Taking? Authorizing Provider  azelastine (ASTELIN) 0.1 % nasal spray PLACE 2 SPRAYS INTO BOTH NOSTRILS 2 TIMES DAILY. Patient taking differently: PLACE 2 SPRAYS INTO BOTH NOSTRILS 2 TIMES DAILY AS NEEDED FOR ALLERGIES 02/27/16  Yes [name], MD  Cholecalciferol (VITAMIN D3) 2000 UNITS TABS Take 1 tablet by mouth daily.    Yes Historical Provider, MD  diphenhydrAMINE (BENADRYL) 25 mg capsule Take 25 mg by mouth daily.   Yes Historical Provider, MD  escitalopram (LEXAPRO) 20 MG tablet Take 1 tablet (20 mg total) by mouth daily. 08/23/16  Yes Marne A Tower, MD  fluticasone (FLONASE) 50 MCG/ACT nasal spray PLACE 1 TO 2 SPRAYS IN EACH NOSTRIL DAILY. Patient taking differently: PLACE 1 TO 2 SPRAYS IN EACH NOSTRIL DAILY AT BEDTIME 02/27/16  Yes Marne A Tower, MD  mometasone (ELOCON) 0.1 % cream Apply to affected area in small amount 3 times weekly as needed Patient taking differently: Apply 1 application topically 3 (three) times daily as needed (for itching).  11/10/15  Yes [name], MD  omeprazole (PRILOSEC) 20 MG capsule TAKE 1 CAPSULE BY MOUTH 2 TIMES DAILY. Patient taking differently: TAKE 1 CAPSULE BY  MOUTH ONCE DAILY 06/21/16  Yes [name], MD  traMADol (ULTRAM) 50 MG tablet TAKE 1 TABLET BY MOUTH EVERY 8 HOURS AS NEEDED FOR SEVERE PAIN. 03/30/16  Yes [name], MD    Physical Exam: Vitals:   09/06/16 1650 09/06/16 1858 09/06/16 2106 09/06/16 2248  BP:  135/68 (!) 188/81 (!) 176/94  Pulse:  78 72 75  Resp:  18 14 (!) 24  Temp:  97.7 F (36.5 C) 98.5 F (36.9 C)   TempSrc:  Oral Oral   SpO2:  96% 97% 95%  Weight: 56.7 kg (125 lb)     Height:  (1.499 m)         Constitutional: NAD, calm, appears anxious, uncomfortable Eyes: PERTLA, lids and conjunctivae normal ENMT: Mucous membranes are moist. Posterior pharynx clear of any exudate or lesions.   Neck: normal, supple, no masses, no thyromegaly Respiratory: clear to auscultation bilaterally, no wheezing, no crackles.  Normal respiratory effort.    Cardiovascular: S1 & S2 heard, regular rate and rhythm. No significant JVD. Abdomen: No distension, suprapubic tenderness without rebound pain or guarding, no masses palpated. Bowel sounds normal.  Musculoskeletal: no clubbing / cyanosis. No joint deformity upper and lower extremities.   Skin: no significant rashes, lesions, ulcers. Warm, dry, well-perfused. Neurologic: CN 2-12 grossly intact. Sensation intact, DTR normal. Strength 5/5 in all 4 limbs.  Psychiatric: Alert and oriented x 3. Intermittently confused.     Labs on Admission: I have personally reviewed following labs and imaging studies  CBC:  Recent Labs Lab 09/06/16 1656  WBC 8.6  HGB 13.3  HCT 39.3  MCV 87.5  PLT 260   Basic Metabolic Panel:  Recent Labs Lab 09/06/16 1656  NA 132*  K 3.5  CL 99*  CO2 25  GLUCOSE 109*  BUN 22*  CREATININE 1.06*  CALCIUM 9.4   GFR: Estimated Creatinine Clearance: 30.9 mL/min (A) (by C-G formula based on SCr of 1.06 mg/dL (H)). Liver Function Tests:  Recent Labs Lab 09/06/16 1901  AST 37  ALT 23  ALKPHOS 114  BILITOT 0.4  PROT 7.4  ALBUMIN 4.0     No results for input(s): LIPASE, AMYLASE in the last 168 hours. No results for input(s): AMMONIA in the last 168 hours. Coagulation Profile: No results for input(s): INR, PROTIME in the last 168 hours. Cardiac Enzymes: No results for input(s): CKTOTAL, CKMB, CKMBINDEX, TROPONINI in the last 168 hours. BNP (last 3 results) No results for input(s): PROBNP in the last 8760 hours. HbA1C: No results for input(s): HGBA1C in the last 72 hours. CBG: No results for input(s): GLUCAP in the last 168 hours. Lipid Profile: No results for input(s): CHOL, HDL, LDLCALC, TRIG, CHOLHDL, LDLDIRECT in the last 72 hours. Thyroid Function Tests: No results for input(s): TSH, T4TOTAL, FREET4, T3FREE, THYROIDAB in the last 72 hours. Anemia Panel: No results for input(s): VITAMINB12, FOLATE, FERRITIN, TIBC, IRON, RETICCTPCT in the last 72 hours. Urine analysis:    Component Value Date/Time   COLORURINE YELLOW 09/06/2016 1651   APPEARANCEUR HAZY (A) 09/06/2016 1651   LABSPEC 1.017 09/06/2016 1651   PHURINE 5.0 09/06/2016 1651   GLUCOSEU NEGATIVE 09/06/2016 1651   HGBUR MODERATE (A) 09/06/2016 1651   HGBUR small 10/02/2009 0840   BILIRUBINUR NEGATIVE 09/06/2016 1651   BILIRUBINUR Negative 08/23/2016 1536   KETONESUR 5 (A) 09/06/2016 1651   PROTEINUR NEGATIVE 09/06/2016 1651   UROBILINOGEN 0.2 08/23/2016 1536   UROBILINOGEN 0.2 08/30/2012 2259   NITRITE NEGATIVE 09/06/2016 1651   LEUKOCYTESUR MODERATE (A) 09/06/2016 1651   Sepsis Labs: (procalcitonin:4,lacticidven:4) )No results found for this or any previous visit (from the past 240 hour(s)).   Radiological Exams on Admission: Ct Head Wo Contrast  Result Date: 09/06/2016 CLINICAL DATA:  Initial evaluation for acute trauma, fall. EXAM: CT HEAD WITHOUT CONTRAST TECHNIQUE: Contiguous axial images were obtained from the base of the skull through the vertex without intravenous contrast. COMPARISON:  Prior CT from 01/01/2016. FINDINGS: Brain:  Generalized age-related cerebral atrophy. Fairly advanced chronic microvascular ischemic disease noted, similar to previous. Remote lacunar infarct noted within the left basal ganglia. No acute intracranial hemorrhage. No evidence for acute large vessel territory infarct. No mass lesion, midline shift or mass effect. No hydrocephalus. No extra-axial fluid collection. Vascular: No asymmetric hyperdense vessel. Scattered vascular calcifications noted within the carotid siphons and distal vertebral arteries. Skull: Scalp soft tissues demonstrate no acute abnormality. Question small left facial contusion. Calvarium intact. Sinuses/Orbits:  Globes and orbital soft tissues within normal limits. Patient status post lens extraction bilaterally. Paranasal sinuses are clear. No mastoid effusion. Other: None. IMPRESSION: 1. No acute intracranial process identified. 2. Question small left facial contusion. Correlation with physical exam recommended. 3. Stable atrophy with advanced chronic microvascular ischemic disease. Electronically Signed   By: Rise Mu M.D.   On: 09/06/2016 19:32    EKG: Independently reviewed. Normal sinus rhythm.   Assessment/Plan  1. Acute encephalopathy  - Pt presents with increasing confusion in recent days  - No focal neurologic deficit identified and head CT negative for acute intracranial abnormality  - ED workup with mild hyponatremia and UA suggestive of possible UTI  - Plan to treat UTI as below, check B12, folate, RPR, TSH    2. UTI  - Urinary urgency and frequency reported, suprapubic tenderness noted  - UA suggestive of possible infection and is sent for culture  - Remote culture grew E coli with resistance to ampicillin, FQ's, and aminoglycosides - Continue empiric Rocephin pending culture data   3. Hyponatremia  - Serum sodium is 133 on admission in setting of hypovolemia  - She was given 500 cc NS in ED and is continued on gentle NS infusion   - Repeat chem  panel in am   4. Depression with anxiety  - Pt appears anxious on admission  - Continue Lexapro    5. Generalized weakness  - Acute infection likely contributing, but family reports progressive weakness over several weeks  - No focal deficits identified and head CT negative for acute intracranial abnormality  - B12 and folate levels pending - PT eval and treat requested     DVT prophylaxis: sq Lovenox  Code Status: Full  Family Communication: Family friend updated at bedside with pt's permission Disposition Plan: Observe on med-surg Consults called: None Admission status: Observation   Briscoe Deutscher, MD [facility] Pager [phone]  If 7PM-7AM, please contact night-coverage [email or web address] Password [email or web address]  09/07/2016, 12:59 AM

## 2016-09-07 NOTE — Progress Notes (Signed)
PHARMACY NOTE -  ANTIBIOTIC RENAL DOSE ADJUSTMENT   Request received for Pharmacy to assist with antibiotic renal dose adjustment.  Patient has been initiated on Ceftriaxone 1gm iv q24hr   for UTI. SCr 1.06, estimated CrCl ~30 ml/min Current dosage is appropriate and need for further dosage adjustment appears unlikely at present. Will sign off at this time.  Please reconsult if a change in clinical status warrants re-evaluation of dosage.

## 2016-09-08 DIAGNOSIS — N390 Urinary tract infection, site not specified: Principal | ICD-10-CM

## 2016-09-08 DIAGNOSIS — I10 Essential (primary) hypertension: Secondary | ICD-10-CM

## 2016-09-08 DIAGNOSIS — E871 Hypo-osmolality and hyponatremia: Secondary | ICD-10-CM

## 2016-09-08 DIAGNOSIS — G9340 Encephalopathy, unspecified: Secondary | ICD-10-CM

## 2016-09-08 DIAGNOSIS — R531 Weakness: Secondary | ICD-10-CM

## 2016-09-08 LAB — BASIC METABOLIC PANEL
Anion gap: 6 (ref 5–15)
BUN: 10 mg/dL (ref 6–20)
CALCIUM: 8.5 mg/dL — AB (ref 8.9–10.3)
CO2: 27 mmol/L (ref 22–32)
Chloride: 103 mmol/L (ref 101–111)
Creatinine, Ser: 0.82 mg/dL (ref 0.44–1.00)
GFR calc non Af Amer: >60 mL/min (ref >60)
Glucose, Bld: 109 mg/dL — ABNORMAL HIGH (ref 65–99)
Potassium: 3.6 mmol/L (ref 3.5–5.1)
SODIUM: 136 mmol/L (ref 135–145)

## 2016-09-08 LAB — MAGNESIUM: Magnesium: 1.6 mg/dL — ABNORMAL LOW (ref 1.7–2.4)

## 2016-09-08 LAB — URINE CULTURE

## 2016-09-08 MED ORDER — SODIUM CHLORIDE 0.9 % IV SOLN: INTRAVENOUS | Status: DC

## 2016-09-08 MED ORDER — POTASSIUM CHLORIDE IN NACL 20-0.9 MEQ/L-% IV SOLN
INTRAVENOUS | Status: AC
  Administered 2016-09-08: 16:00:00 via INTRAVENOUS
  Filled 2016-09-08: qty 1000

## 2016-09-08 MED ORDER — MAGNESIUM SULFATE 2 GM/50ML IV SOLN
2.0000 g | Freq: Once | INTRAVENOUS | Status: AC
  Administered 2016-09-08: 2 g via INTRAVENOUS
  Filled 2016-09-08: qty 50

## 2016-09-08 NOTE — Progress Notes (Signed)
CSW met with patient at beside, explain role and reason for visit. Patient reports she is not interested in Skilled Nursing care at this time. Patient reports she wants to go home and have PT services come to her home. RNCM following for discharge per note.   Kathrin Greathouse, Latanya Presser, MSW Clinical Social Worker 5E and Psychiatric Service Line [phone] 09/08/2016  12:02 PM

## 2016-09-08 NOTE — Progress Notes (Signed)
Physical Therapy Treatment Patient Details Name: Amber Rollins MRN: [id number] DOB: 08/20/1930 Today's Date: 09/08/2016    History of Present Illness Pt admitted with weakness and diorientation s/p fall at home.  Pt with hx of kyphoplasty at T-7, T-8, and T 12 in 2014 s/p fall through attic floor.    PT Comments    Assisted OOB to attempt to amb to bathroom when pt became incont urine.  Assisted in bathroom.  Assisted with hygiene.  Assisted with gown/sock change.  Applied hospital panties and pad.  Assisting pt with turns and transfer in bathroom, pt had x 3 LOB in which therapist recovered.  LOB during turns, backward steps and while to stand and self pull up undergarment.  "Don't tell my daughter".  Pt wants to return home but she is alone.  Pt stated was was at 2 different faciities prior and does not wish  to return.  Assisted with amb a great distance in hallway with no LOB but noted gait instability and delayed corrective response.  HIGH FALL RISK.    Follow Up Recommendations     1. SNF but pt declines  2. HH with 24/7 assist   Equipment Recommendations       Recommendations for Other Services       Precautions / Restrictions Precautions Precautions: Fall Restrictions Weight Bearing Restrictions: No    Mobility  Bed Mobility Overal bed mobility: Needs Assistance Bed Mobility: Supine to Sit     Supine to sit: Supervision     General bed mobility comments: increased time and use of rail but pt able to self perfom  Transfers Overall transfer level: Needs assistance Equipment used: Rolling walker (2 wheeled) Transfers: Sit to/from Stand Sit to Stand: Min assist         General transfer comment: 25% VC's on safety with turns and hand placement.  Assisted in bathroom and pt had three near falls with transfers and with static standing and trying to pull up underpants.   Ambulation/Gait Ambulation/Gait assistance: Min guard;Min assist Ambulation Distance  (Feet): 85 Feet Assistive device: Rolling walker (2 wheeled) Gait Pattern/deviations: Step-through pattern;Decreased step length - right;Decreased step length - left;Shuffle;Trunk flexed     General Gait Details: <25% VC's on safety with turns and walker to self distance.  Tolerated a great distance and noted mild unsteadiness with straight away and Mod unsteadiness with turns.  Pt demonstartes poor, delayed self corrective reaction.  HIGH FALL RISK.    Stairs            Wheelchair Mobility    Modified Rankin (Stroke Patients Only)       Balance                                            Cognition Arousal/Alertness: Awake/alert Behavior During Therapy: WFL for tasks assessed/performed Overall Cognitive Status: Within Functional Limits for tasks assessed                                        Exercises      General Comments        Pertinent Vitals/Pain Pain Assessment: No/denies pain    Home Living  Prior Function            PT Goals (current goals can now be found in the care plan section) Progress towards PT goals: Progressing toward goals    Frequency           PT Plan      Co-evaluation             End of Session Equipment Utilized During Treatment: Gait belt Activity Tolerance: Patient tolerated treatment well Patient left: in chair;with chair alarm set;with call bell/phone within reach Nurse Communication:  (communicated to NT to have pt use BSC with assist vs bed pan.  "Pt needs to get up, needs activity")       Time: 1402-1430 PT Time Calculation (min) (ACUTE ONLY): 28 min  Charges:  $Gait Training: 8-22 mins $Therapeutic Activity: 8-22 mins                    G Codes:       {Cassell Voorhies  PTA [facility]  Acute  Rehab Pager      [phone]

## 2016-09-08 NOTE — Discharge Summary (Addendum)
Physician Discharge Summary  Amber Rollins WUJ:[id number] DOB: 05-31-1931 DOA: 09/06/2016  PCP: Roxy Manns, MD  Admit date: 09/06/2016 Discharge date: 09/09/2016  Admitted From:  Home Disposition:  Home  Recommendations for Outpatient Follow-up:  1. Follow up with PCP in 1-2 weeks 2. Please obtain BMP/CBC in one week   Home Health: [facility]PT Equipment/Devices:none  Discharge Condition: Stable CODE STATUS:FULL Diet recommendation: Heart Healthy  Brief/Interim Summary: 83 y.o.femalewith medical history significant for chronic foot pain, hypertension, depression with anxiety, and GERD who presents to the emergency department for evaluation of confusion and generalized weakness x 3 days. Upon visiting her 09/06/16, family found the patient on the floor, reportedly too weak to get up. She was helped up and was able to ambulate with minimal assistance. She denied any specific complaints but has been reportedly needing to urinate very frequently recently with only small volume voids. Patient denies any headache, change in vision or hearing, loss of coordination, or focal numbness or weakness. She denies chest pain, palpitations, dyspnea, or cough.  Discharge Diagnoses:  1. Acute Metabolic encephalopathy  - secondary to UTI and dehydration with hyponatremia  - No focal neurologic deficit identified and head CT negative for acute intracranial abnormality  - B12, folate WNL -HIV & RPR--neg -TSH 4.182 -resolved  2. UTI  - Urinary urgency and frequency reported, suprapubic tenderness noted  - UA suggestive of possible infection - Culture = multiple organisms - Continue empiric Rocephin  -plan d/c with cefuroxime 500 mg bid  x 4 more days  3. Hyponatremia  - Serum sodium is 132 on admission in setting of hypovolemia  - IVF x 12 more hours - Na 136 on day of d/c  4. Depression with anxiety  - Pt appears anxious on admission  - Continue Lexapro   5. Generalized weakness  -  Acute infection and dehydration are contributing superimposed upon deconditionin - No focal deficits identified and head CT negative for acute intracranial abnormality  - B12 and folate levels-WNL - PT eval and treat -->SNF   Discharge Instructions  Discharge Instructions    Diet - low sodium heart healthy    Complete by:  As directed    Increase activity slowly    Complete by:  As directed      Allergies as of 09/09/2016      Reactions   Tdap [diphth-acell Pertussis-tetanus] Swelling   Amlodipine Besylate    REACTION: edema   Codeine    headache   Hydrocodone    hallucinations   Lovastatin    REACTION: leg pain   Metronidazole Swelling   And body aches   Penicillins Itching   Unknown reaction; has tolerated cephalosporins Has patient had a PCN reaction causing immediate rash, facial/tongue/throat swelling, SOB or lightheadedness with hypotension:unsure Has patient had a PCN reaction causing severe rash involving mucus membranes or skin necrosis:unsure Has patient had a PCN reaction that required hospitalization:unsure Has patient had a PCN reaction occurring within the last 10 years:No If all of the above answers are "NO", then may proceed with Cephalosporin use.   Sertraline Hcl    REACTION: caused insomnia   Simvastatin    REACTION: non tolerant- leg pain   Latex Rash   Sulfonamide Derivatives Rash      Medication List    TAKE these medications   azelastine 0.1 % nasal spray Commonly known as:  ASTELIN PLACE 2 SPRAYS INTO BOTH NOSTRILS 2 TIMES DAILY. What changed:  See the new instructions.   cefUROXime  500 MG tablet Commonly known as:  CEFTIN Take 1 tablet (500 mg total) by mouth 2 (two) times daily with a meal.   diphenhydrAMINE 25 mg capsule Commonly known as:  BENADRYL Take 25 mg by mouth daily.   escitalopram 20 MG tablet Commonly known as:  LEXAPRO Take 1 tablet (20 mg total) by mouth daily.   fluticasone 50 MCG/ACT nasal spray Commonly known  as:  FLONASE PLACE 1 TO 2 SPRAYS IN EACH NOSTRIL DAILY. What changed:  See the new instructions.   mometasone 0.1 % cream Commonly known as:  ELOCON Apply to affected area in small amount 3 times weekly as needed What changed:  how much to take  how to take this  when to take this  reasons to take this  additional instructions   omeprazole 20 MG capsule Commonly known as:  PRILOSEC TAKE 1 CAPSULE BY MOUTH 2 TIMES DAILY. What changed:  See the new instructions.   traMADol 50 MG tablet Commonly known as:  ULTRAM TAKE 1 TABLET BY MOUTH EVERY 8 HOURS AS NEEDED FOR SEVERE PAIN.   Vitamin D3 2000 units Tabs Take 1 tablet by mouth daily.       Allergies  Allergen Reactions  . Tdap [Diphth-Acell Pertussis-Tetanus] Swelling  . Amlodipine Besylate     REACTION: edema  . Codeine     headache  . Hydrocodone     hallucinations  . Lovastatin     REACTION: leg pain  . Metronidazole Swelling    And body aches  . Penicillins Itching    Unknown reaction; has tolerated cephalosporins Has patient had a PCN reaction causing immediate rash, facial/tongue/throat swelling, SOB or lightheadedness with hypotension:unsure Has patient had a PCN reaction causing severe rash involving mucus membranes or skin necrosis:unsure Has patient had a PCN reaction that required hospitalization:unsure Has patient had a PCN reaction occurring within the last 10 years:No If all of the above answers are "NO", then may proceed with Cephalosporin use.   . Sertraline Hcl     REACTION: caused insomnia  . Simvastatin     REACTION: non tolerant- leg pain  . Latex Rash  . Sulfonamide Derivatives Rash    Consultations:  none   Procedures/Studies: Ct Head Wo Contrast  Result Date: 09/06/2016 CLINICAL DATA:  Initial evaluation for acute trauma, fall. EXAM: CT HEAD WITHOUT CONTRAST TECHNIQUE: Contiguous axial images were obtained from the base of the skull through the vertex without intravenous  contrast. COMPARISON:  Prior CT from 01/01/2016. FINDINGS: Brain: Generalized age-related cerebral atrophy. Fairly advanced chronic microvascular ischemic disease noted, similar to previous. Remote lacunar infarct noted within the left basal ganglia. No acute intracranial hemorrhage. No evidence for acute large vessel territory infarct. No mass lesion, midline shift or mass effect. No hydrocephalus. No extra-axial fluid collection. Vascular: No asymmetric hyperdense vessel. Scattered vascular calcifications noted within the carotid siphons and distal vertebral arteries. Skull: Scalp soft tissues demonstrate no acute abnormality. Question small left facial contusion. Calvarium intact. Sinuses/Orbits: Globes and orbital soft tissues within normal limits. Patient status post lens extraction bilaterally. Paranasal sinuses are clear. No mastoid effusion. Other: None. IMPRESSION: 1. No acute intracranial process identified. 2. Question small left facial contusion. Correlation with physical exam recommended. 3. Stable atrophy with advanced chronic microvascular ischemic disease. Electronically Signed   By: Rise Mu M.D.   On: 09/06/2016 19:32        Discharge Exam: Vitals:   09/08/16 2100 09/09/16 0621  BP: (!) 156/100 (!) 161/75  Pulse: 84 82  Resp: 20 18  Temp: 98.6 F (37 C) 99.2 F (37.3 C)   Vitals:   09/08/16 0432 09/08/16 1446 09/08/16 2100 09/09/16 0621  BP: (!) 153/68 (!) 150/60 (!) 156/100 (!) 161/75  Pulse: 74 69 84 82  Resp: (!) Temp: 98.3 F (36.8 C) 98.2 F (36.8 C) 98.6 F (37 C) 99.2 F (37.3 C)  TempSrc: Oral Oral Oral Oral  SpO2: 93% 95% 95% 92%  Weight:      Height:        General: Pt is alert, awake, not in acute distress Cardiovascular: RRR, S1/S2 +, no rubs, no gallops Respiratory: CTA bilaterally, no wheezing, no rhonchi Abdominal: Soft, NT, ND, bowel sounds + Extremities: no edema, no cyanosis   The results of significant diagnostics  from this hospitalization (including imaging, microbiology, ancillary and laboratory) are listed below for reference.    Significant Diagnostic Studies: Ct Head Wo Contrast  Result Date: 09/06/2016 CLINICAL DATA:  Initial evaluation for acute trauma, fall. EXAM: CT HEAD WITHOUT CONTRAST TECHNIQUE: Contiguous axial images were obtained from the base of the skull through the vertex without intravenous contrast. COMPARISON:  Prior CT from 01/01/2016. FINDINGS: Brain: Generalized age-related cerebral atrophy. Fairly advanced chronic microvascular ischemic disease noted, similar to previous. Remote lacunar infarct noted within the left basal ganglia. No acute intracranial hemorrhage. No evidence for acute large vessel territory infarct. No mass lesion, midline shift or mass effect. No hydrocephalus. No extra-axial fluid collection. Vascular: No asymmetric hyperdense vessel. Scattered vascular calcifications noted within the carotid siphons and distal vertebral arteries. Skull: Scalp soft tissues demonstrate no acute abnormality. Question small left facial contusion. Calvarium intact. Sinuses/Orbits: Globes and orbital soft tissues within normal limits. Patient status post lens extraction bilaterally. Paranasal sinuses are clear. No mastoid effusion. Other: None. IMPRESSION: 1. No acute intracranial process identified. 2. Question small left facial contusion. Correlation with physical exam recommended. 3. Stable atrophy with advanced chronic microvascular ischemic disease. Electronically Signed   By: Rise Mu M.D.   On: 09/06/2016 19:32     Microbiology: Recent Results (from the past 240 hour(s))  Urine culture     Status: Abnormal   Collection Time: 09/06/16  7:49 PM  Result Value Ref Range Status   Specimen Description URINE, CLEAN CATCH  Final   Special Requests NONE  Final   Culture MULTIPLE SPECIES PRESENT, SUGGEST RECOLLECTION (A)  Final   Report Status 09/08/2016 FINAL  Final      Labs: Basic Metabolic Panel:  Recent Labs Lab 09/06/16 1656 09/07/16 0608 09/08/16 0633 09/09/16 0650  NA 132* 136 136 136  K 3.5 3.1* 3.6 3.7  CL 99* 101 103 102  CO2 GLUCOSE 109* 109* 109* 113*  BUN 22* CREATININE 1.06* 0.88 0.82 0.85  CALCIUM 9.4 8.7* 8.5* 8.4*  MG  --   --  1.6* 2.0   Liver Function Tests:  Recent Labs Lab 09/06/16 1901  AST 37  ALT 23  ALKPHOS 114  BILITOT 0.4  PROT 7.4  ALBUMIN 4.0   No results for input(s): LIPASE, AMYLASE in the last 168 hours. No results for input(s): AMMONIA in the last 168 hours. CBC:  Recent Labs Lab 09/06/16 1656  WBC 8.6  HGB 13.3  HCT 39.3  MCV 87.5  PLT 260   Cardiac Enzymes: No results for input(s): CKTOTAL, CKMB, CKMBINDEX, TROPONINI in the last 168 hours. BNP: Invalid input(s): POCBNP  CBG: No results for input(s): GLUCAP in the last 168 hours.  Time coordinating discharge:  Greater than 30 minutes  Signed:  Aleshka Corney, DO [facility] Pager: [phone] 09/09/2016, 9:51 AM

## 2016-09-08 NOTE — Progress Notes (Signed)
PROGRESS NOTE  Amber Rollins WUJ:[id number] DOB: 12-07-1930 DOA: 09/06/2016 PCP: Roxy Manns, MD  Brief History:   83 y.o. female with medical history significant for chronic foot pain, hypertension, depression with anxiety, and GERD who presents to the emergency department for evaluation of confusion and generalized weakness x 3 days. Upon visiting her 09/06/16, family found the patient on the floor, reportedly too weak to get up. She was helped up and was able to ambulate with minimal assistance. She denied any specific complaints but has been reportedly needing to urinate very frequently recently with only small volume voids. Patient denies any headache, change in vision or hearing, loss of coordination, or focal numbness or weakness. She denies chest pain, palpitations, dyspnea, or cough.  Assessment/Plan: 1. Acute Metabolic encephalopathy  - secondary to UTI and dehydration with hyponatremia  - No focal neurologic deficit identified and head CT negative for acute intracranial abnormality  - B12, folate WNL -HIV & RPR--neg -TSH 4.182  2. UTI  - Urinary urgency and frequency reported, suprapubic tenderness noted  - UA suggestive of possible infection - Culture = multiple organisms - Continue empiric Rocephin  -plan d/c with cefuroxime  3. Hyponatremia  - Serum sodium is 132 on admission in setting of hypovolemia  - IVF x 12 more hours - Repeat chem panel in am   4. Depression with anxiety  - Pt appears anxious on admission  - Continue Lexapro    5. Generalized weakness  - Acute infection and dehydration are contributing superimposed upon deconditionin - No focal deficits identified and head CT negative for acute intracranial abnormality  - B12 and folate levels-WNL - PT eval and treat -->SNF but pt refuses   Disposition Plan:   Home 4/12 if stable Family Communication:   Family at bedside  Consultants:  none  Code Status:  FULL / DNR  DVT  Prophylaxis:  [place] Heparin / [place] Lovenox   Procedures: As Listed in Progress Note Above  Antibiotics: None    Subjective: Patient is feeling better today. She denies any fevers, chest, chest pain on forced breath, nausea, vomiting, diarrhea, abdominal pain. No dysuria or hematuria. No headache or neck pain.  Objective: Vitals:   09/07/16 1415 09/07/16 2106 09/08/16 0432 09/08/16 1446  BP: (!) 120/55 (!) 173/65 (!) 153/68 (!) 150/60  Pulse: 91 78 74 69  Resp: 18 20 (!) 22 20  Temp: 98.2 F (36.8 C) 98.8 F (37.1 C) 98.3 F (36.8 C) 98.2 F (36.8 C)  TempSrc: Oral Oral Oral Oral  SpO2: 95% 94% 93% 95%  Weight:      Height:        Intake/Output Summary (Last 24 hours) at 09/08/16 1752 Last data filed at 09/08/16 1447  Gross per 24 hour  Intake              600 ml  Output                0 ml  Net              600 ml   Weight change:  Exam:   General:  Pt is alert, follows commands appropriately, not in acute distress  HEENT: No icterus, No thrush, No neck mass, [place]/AT  Cardiovascular: RRR, S1/S2, no rubs, no gallops  Respiratory: Bibasilar crackles. No wheeze. Good air movement., no wheezing, no crackles, no rhonchi  Abdomen: Soft/+BS, non tender, non distended, no guarding  Extremities:  No edema, No lymphangitis, No petechiae, No rashes, no synovitis   Data Reviewed: I have personally reviewed following labs and imaging studies Basic Metabolic Panel:  Recent Labs Lab 09/06/16 1656 09/07/16 0608 09/08/16 0633  NA 132* 136 136  K 3.5 3.1* 3.6  CL 99* 101 103  CO2 GLUCOSE 109* 109* 109*  BUN 22* 14 10  CREATININE 1.06* 0.88 0.82  CALCIUM 9.4 8.7* 8.5*  MG  --   --  1.6*   Liver Function Tests:  Recent Labs Lab 09/06/16 1901  AST 37  ALT 23  ALKPHOS 114  BILITOT 0.4  PROT 7.4  ALBUMIN 4.0   No results for input(s): LIPASE, AMYLASE in the last 168 hours. No results for input(s): AMMONIA in the last 168 hours. Coagulation  Profile: No results for input(s): INR, PROTIME in the last 168 hours. CBC:  Recent Labs Lab 09/06/16 1656  WBC 8.6  HGB 13.3  HCT 39.3  MCV 87.5  PLT 260   Cardiac Enzymes: No results for input(s): CKTOTAL, CKMB, CKMBINDEX, TROPONINI in the last 168 hours. BNP: Invalid input(s): POCBNP CBG: No results for input(s): GLUCAP in the last 168 hours. HbA1C: No results for input(s): HGBA1C in the last 72 hours. Urine analysis:    Component Value Date/Time   COLORURINE YELLOW 09/06/2016 1651   APPEARANCEUR HAZY (A) 09/06/2016 1651   LABSPEC 1.017 09/06/2016 1651   PHURINE 5.0 09/06/2016 1651   GLUCOSEU NEGATIVE 09/06/2016 1651   HGBUR MODERATE (A) 09/06/2016 1651   HGBUR small 10/02/2009 0840   BILIRUBINUR NEGATIVE 09/06/2016 1651   BILIRUBINUR Negative 08/23/2016 1536   KETONESUR 5 (A) 09/06/2016 1651   PROTEINUR NEGATIVE 09/06/2016 1651   UROBILINOGEN 0.2 08/23/2016 1536   UROBILINOGEN 0.2 08/30/2012 2259   NITRITE NEGATIVE 09/06/2016 1651   LEUKOCYTESUR MODERATE (A) 09/06/2016 1651   Sepsis Labs: (procalcitonin:4,lacticidven:4) ) Recent Results (from the past 240 hour(s))  Urine culture     Status: Abnormal   Collection Time: 09/06/16  7:49 PM  Result Value Ref Range Status   Specimen Description URINE, CLEAN CATCH  Final   Special Requests NONE  Final   Culture MULTIPLE SPECIES PRESENT, SUGGEST RECOLLECTION (A)  Final   Report Status 09/08/2016 FINAL  Final     Scheduled Meds: . cefTRIAXone (ROCEPHIN)  IV  1 g Intravenous Q24H  . cholecalciferol  2,000 Units Oral Daily  . enoxaparin (LOVENOX) injection  40 mg Subcutaneous Q24H  . escitalopram  20 mg Oral Daily  . fluticasone  1 spray Each Nare Daily  . pantoprazole  40 mg Oral Daily  . sodium chloride flush  3 mL Intravenous Q12H   Continuous Infusions: . 0.9 % NaCl with KCl 20 mEq / L 75 mL/hr at 09/08/16 1629    Procedures/Studies: Ct Head Wo Contrast  Result Date: 09/06/2016 CLINICAL DATA:   Initial evaluation for acute trauma, fall. EXAM: CT HEAD WITHOUT CONTRAST TECHNIQUE: Contiguous axial images were obtained from the base of the skull through the vertex without intravenous contrast. COMPARISON:  Prior CT from 01/01/2016. FINDINGS: Brain: Generalized age-related cerebral atrophy. Fairly advanced chronic microvascular ischemic disease noted, similar to previous. Remote lacunar infarct noted within the left basal ganglia. No acute intracranial hemorrhage. No evidence for acute large vessel territory infarct. No mass lesion, midline shift or mass effect. No hydrocephalus. No extra-axial fluid collection. Vascular: No asymmetric hyperdense vessel. Scattered vascular calcifications noted within the carotid siphons and distal vertebral arteries. Skull: Scalp soft tissues  demonstrate no acute abnormality. Question small left facial contusion. Calvarium intact. Sinuses/Orbits: Globes and orbital soft tissues within normal limits. Patient status post lens extraction bilaterally. Paranasal sinuses are clear. No mastoid effusion. Other: None. IMPRESSION: 1. No acute intracranial process identified. 2. Question small left facial contusion. Correlation with physical exam recommended. 3. Stable atrophy with advanced chronic microvascular ischemic disease. Electronically Signed   By: Rise Mu M.D.   On: 09/06/2016 19:32    Amber Hatchel, DO  [facility] Pager [phone]  If 7PM-7AM, please contact night-coverage [email or web address] Password [email or web address] 09/08/2016, 5:52 PM   LOS: 1 day

## 2016-09-08 NOTE — Care Management Note (Signed)
Case Management Note  Patient Details  Name: Amber Rollins MRN: [id number] Date of Birth: July 11, 1930  Subjective/Objective:       sepsis             Action/Plan: Date:  September 08, 2016 Chart reviewed for concurrent status and case management needs. Will continue to follow patient progress. Discharge Planning: following for needs Expected discharge date: [id number] Marcelle Smiling, BSN, [place], [place]   [phone]  Expected Discharge Date:                  Expected Discharge Plan:  Home/Self Care  In-House Referral:     Discharge planning Services  CM Consult  Post Acute Care Choice:    Choice offered to:     DME Arranged:    DME Agency:     HH Arranged:    HH Agency:     Status of Service:  In process, will continue to follow  If discussed at Long Length of Stay Meetings, dates discussed:    Additional Comments:  Golda Acre, RN 09/08/2016, 10:03 AM

## 2016-09-09 DIAGNOSIS — M6281 Muscle weakness (generalized): Secondary | ICD-10-CM | POA: Diagnosis not present

## 2016-09-09 DIAGNOSIS — N390 Urinary tract infection, site not specified: Secondary | ICD-10-CM | POA: Diagnosis not present

## 2016-09-09 DIAGNOSIS — Z5189 Encounter for other specified aftercare: Secondary | ICD-10-CM | POA: Diagnosis not present

## 2016-09-09 DIAGNOSIS — M79673 Pain in unspecified foot: Secondary | ICD-10-CM | POA: Diagnosis not present

## 2016-09-09 DIAGNOSIS — F418 Other specified anxiety disorders: Secondary | ICD-10-CM | POA: Diagnosis not present

## 2016-09-09 DIAGNOSIS — E871 Hypo-osmolality and hyponatremia: Secondary | ICD-10-CM | POA: Diagnosis not present

## 2016-09-09 DIAGNOSIS — K219 Gastro-esophageal reflux disease without esophagitis: Secondary | ICD-10-CM | POA: Diagnosis not present

## 2016-09-09 DIAGNOSIS — G9341 Metabolic encephalopathy: Secondary | ICD-10-CM | POA: Diagnosis not present

## 2016-09-09 DIAGNOSIS — R531 Weakness: Secondary | ICD-10-CM | POA: Diagnosis not present

## 2016-09-09 DIAGNOSIS — I10 Essential (primary) hypertension: Secondary | ICD-10-CM | POA: Diagnosis not present

## 2016-09-09 DIAGNOSIS — G9340 Encephalopathy, unspecified: Secondary | ICD-10-CM | POA: Diagnosis not present

## 2016-09-09 LAB — BASIC METABOLIC PANEL
ANION GAP: 8 (ref 5–15)
BUN: 10 mg/dL (ref 6–20)
CALCIUM: 8.4 mg/dL — AB (ref 8.9–10.3)
CHLORIDE: 102 mmol/L (ref 101–111)
CO2: 26 mmol/L (ref 22–32)
Creatinine, Ser: 0.85 mg/dL (ref 0.44–1.00)
GFR calc non Af Amer: >60 mL/min (ref >60)
Glucose, Bld: 113 mg/dL — ABNORMAL HIGH (ref 65–99)
POTASSIUM: 3.7 mmol/L (ref 3.5–5.1)
Sodium: 136 mmol/L (ref 135–145)

## 2016-09-09 LAB — MAGNESIUM: Magnesium: 2 mg/dL (ref 1.7–2.4)

## 2016-09-09 MED ORDER — CEFUROXIME AXETIL 500 MG PO TABS: 500.0000 mg | ORAL_TABLET | Freq: Two times a day (BID) | ORAL | 0 refills | Status: DC

## 2016-09-09 MED ORDER — CEFUROXIME AXETIL 500 MG PO TABS
500.0000 mg | ORAL_TABLET | Freq: Two times a day (BID) | ORAL | Status: DC
  Filled 2016-09-09: qty 1

## 2016-09-09 NOTE — Progress Notes (Addendum)
Patient now agreeable to SNF, specifically [facility]. [facility] made bed offer. CSW called [employer] to initiate insurance authorization. Authorization Received. [facility] will transport.   Vivi Barrack, Theresia Majors, MSW Clinical Social Worker 5E and Psychiatric Service Line [phone] 09/09/2016  10:48 AM

## 2016-09-09 NOTE — Progress Notes (Signed)
Date: September 09, 2016 Discharge orders review for case management needs.  None found Marshawn Ninneman, BSN, RN3, CCM: [phone] 

## 2016-09-09 NOTE — [place] FL2 (Signed)
[place] MEDICAID FL2 LEVEL OF CARE SCREENING TOOL     IDENTIFICATION  Patient Name: Amber Rollins Birthdate: 11/03/1930 Sex: female Admission Date (Current Location): 09/06/2016  [facility] and [place] Number:  Producer, television/film/video and Address:  [facility],  [address] [place] [address]      Provider Number: [id number]  Attending Physician Name and Address:  Catarina Hartshorn, MD  Relative Name and Phone Number:       Current Level of Care: Hospital Recommended Level of Care: Skilled Nursing Facility Prior Approval Number:    Date Approved/Denied:   PASRR Number:  [id number]  Discharge Plan: SNF    Current Diagnoses: Patient Active Problem List   Diagnosis Date Noted  . UTI (urinary tract infection) 09/07/2016  . Foot pain, bilateral 08/23/2016  . Fall from bed 07/26/2016  . Diarrhea 03/29/2016  . Hyponatremia 01/01/2016  . Acute encephalopathy 01/01/2016  . Routine general medical examination at a health care facility 11/10/2015  . Urinary frequency 10/23/2014  . Generalized weakness 10/23/2014  . Hammer toe 09/20/2014  . Herpetic dermatitis 08/09/2014  . Candidal intertrigo 02/11/2014  . Poor balance 04/11/2013  . Thoracic back pain 04/11/2013  . Urinary incontinence 12/27/2012  . Depression 09/24/2012  . Thoracic kyphosis 09/24/2012  . Acute lower UTI 08/12/2012  . Fall 08/09/2012  . Hemorrhoid 12/24/2011  . Dysuria 12/24/2011  . Low back pain 12/24/2011  . Hypokalemia 10/06/2010  . HEMATURIA UNSPECIFIED 10/02/2009  . NIGHT SWEATS 10/02/2009  . GERD 06/18/2009  . INCONTINENCE, URGE 06/18/2009  . FATIGUE 03/12/2009  . HYPOKALEMIA 10/08/2008  . MENOPAUSAL SYNDROME 01/22/2008  . EDEMA 07/19/2007  . Anxiety and depression 06/21/2007  . REACTION, ACUTE STRESS W/EMOTIONAL DSTURB 03/06/2007  . FOOT PAIN, CHRONIC 03/06/2007  . ALLERGIC RHINITIS, SEASONAL 09/12/2006  . IRRITABLE BOWEL SYNDROME 09/12/2006  . FIBROCYSTIC BREAST DISEASE  09/12/2006  . OSTEOARTHRITIS 09/12/2006  . HEMORRHOIDS, HX OF 09/12/2006  . Essential hypertension 05/31/2000  . Disorder of bone and cartilage 11/29/1999  . Hyperlipidemia 05/31/1992    Orientation RESPIRATION BLADDER Height & Weight     Self, Time, Situation, Place  Normal Continent Weight: 132 lb 7.9 oz (60.1 kg) Height:   (149.9 cm)  BEHAVIORAL SYMPTOMS/MOOD NEUROLOGICAL BOWEL NUTRITION STATUS      Continent Diet (Low Sodium Heart Healthy )  AMBULATORY STATUS COMMUNICATION OF NEEDS Skin   Limited Assist Verbally Normal                       Personal Care Assistance Level of Assistance  Bathing, Feeding, Dressing Bathing Assistance: Limited assistance Feeding assistance: Independent Dressing Assistance: Limited assistance     Functional Limitations Info  Sight, Hearing, Speech Sight Info: Adequate Hearing Info: Adequate Speech Info: Adequate    SPECIAL CARE FACTORS FREQUENCY  PT (By licensed PT), OT (By licensed OT)     PT Frequency: 5 OT Frequency: 5            Contractures Contractures Info: Not present    Additional Factors Info  Code Status, Allergies Code Status Info: Fullcode Allergies Info: Tdap Diphth-acell Pertussis-tetanus, Amlodipine Besylate, Codeine, Hydrocodone, Lovastatin, Metronidazole, Penicillins, Sertraline Hcl, Simvastatin, Latex, Sulfonamide Derivatives           Current Medications (09/09/2016):  This is the current hospital active medication list Current Facility-Administered Medications  Medication Dose Route Frequency Provider Last Rate Last Dose  . acetaminophen (TYLENOL) tablet 650 mg  650 mg Oral Q6H PRN  Briscoe Deutscher, MD   650 mg at 09/08/16 2137   Or  . acetaminophen (TYLENOL) suppository 650 mg  650 mg Rectal Q6H PRN Briscoe Deutscher, MD      . bisacodyl (DULCOLAX) EC tablet 5 mg  5 mg Oral Daily PRN Briscoe Deutscher, MD      . cefUROXime (CEFTIN) tablet 500 mg  500 mg Oral BID WC Catarina Hartshorn, MD      .  cholecalciferol (VITAMIN D) tablet 2,000 Units  2,000 Units Oral Daily Briscoe Deutscher, MD   2,000 Units at 09/09/16 0939  . enoxaparin (LOVENOX) injection 40 mg  40 mg Subcutaneous Q24H Briscoe Deutscher, MD   40 mg at 09/09/16 0939  . escitalopram (LEXAPRO) tablet 20 mg  20 mg Oral Daily Briscoe Deutscher, MD   20 mg at 09/09/16 0939  . fluticasone (FLONASE) 50 MCG/ACT nasal spray 1 spray  1 spray Each Nare Daily Briscoe Deutscher, MD   1 spray at 09/09/16 0939  . ondansetron (ZOFRAN) tablet 4 mg  4 mg Oral Q6H PRN Briscoe Deutscher, MD       Or  . ondansetron (ZOFRAN) injection 4 mg  4 mg Intravenous Q6H PRN Briscoe Deutscher, MD      . pantoprazole (PROTONIX) EC tablet 40 mg  40 mg Oral Daily Briscoe Deutscher, MD   40 mg at 09/09/16 0939  . polyethylene glycol (MIRALAX / GLYCOLAX) packet 17 g  17 g Oral Daily PRN Lavone Neri Opyd, MD      . sodium chloride flush (NS) 0.9 % injection 3 mL  3 mL Intravenous Q12H Lavone Neri Opyd, MD   3 mL at 09/08/16 2200  . traMADol (ULTRAM) tablet 50 mg  50 mg Oral Q6H PRN Briscoe Deutscher, MD         Discharge Medications: Please see discharge summary for a list of discharge medications.  Relevant Imaging Results:  Relevant Lab Results:   Additional Information ssn:[id number]  Clearance Coots, LCSW

## 2016-09-09 NOTE — Clinical Social Work Placement (Addendum)
   CLINICAL SOCIAL WORK PLACEMENT  NOTE  Date:  09/09/2016  Patient Details  Name: Amber Rollins MRN: [id number] Date of Birth: 13-May-1931  Clinical Social Work is seeking post-discharge placement for this patient at the Skilled  Nursing Facility level of care (*CSW will initial, date and re-position this form in  chart as items are completed):  Yes   Patient/family provided with [facility] Clinical Social Work Department's list of facilities offering this level of care within the geographic area requested by the patient (or if unable, by the patient's family).  Yes   Patient/family informed of their freedom to choose among providers that offer the needed level of care, that participate in Medicare, Medicaid or managed care program needed by the patient, have an available bed and are willing to accept the patient.  Yes   Patient/family informed of 's ownership interest in [facility] and [facility], as well as of the fact that they are under no obligation to receive care at these facilities.  PASRR submitted to EDS on       PASRR number received on       Existing PASRR number confirmed on 09/09/16     FL2 transmitted to all facilities in geographic area requested by pt/family on       FL2 transmitted to all facilities within larger geographic area on 09/09/16     Patient informed that his/her managed care company has contracts with or will negotiate with certain facilities, including the following:  [facility]     Yes   Patient/family informed of bed offers received.  Patient chooses bed at [facility]     Physician recommends and patient chooses bed at      Patient to be transferred to [facility] on 09/09/16.  Patient to be transferred to facility by [facility]     Patient family notified on 09/09/16 of transfer.  Name of family member notified:   Daughter at bedside     PHYSICIAN Please sign FL2     Additional Comment:     _______________________________________________ Clearance Coots, LCSW 09/09/2016, 10:46 AM

## 2016-09-09 NOTE — Progress Notes (Signed)
Report called to nurse, Consuella Lose, at [facility].  Patient transported by daughter and sister.

## 2016-09-10 ENCOUNTER — Encounter: Payer: Self-pay | Admitting: Internal Medicine

## 2016-09-10 ENCOUNTER — Non-Acute Institutional Stay (SKILLED_NURSING_FACILITY): Payer: PPO | Admitting: Internal Medicine

## 2016-09-10 DIAGNOSIS — E871 Hypo-osmolality and hyponatremia: Secondary | ICD-10-CM | POA: Diagnosis not present

## 2016-09-10 DIAGNOSIS — N3000 Acute cystitis without hematuria: Secondary | ICD-10-CM

## 2016-09-10 DIAGNOSIS — R531 Weakness: Secondary | ICD-10-CM | POA: Diagnosis not present

## 2016-09-10 DIAGNOSIS — J309 Allergic rhinitis, unspecified: Secondary | ICD-10-CM | POA: Diagnosis not present

## 2016-09-10 DIAGNOSIS — F329 Major depressive disorder, single episode, unspecified: Secondary | ICD-10-CM

## 2016-09-10 DIAGNOSIS — K219 Gastro-esophageal reflux disease without esophagitis: Secondary | ICD-10-CM | POA: Diagnosis not present

## 2016-09-10 DIAGNOSIS — E559 Vitamin D deficiency, unspecified: Secondary | ICD-10-CM | POA: Diagnosis not present

## 2016-09-10 DIAGNOSIS — F32A Depression, unspecified: Secondary | ICD-10-CM

## 2016-09-10 NOTE — Progress Notes (Signed)
LOCATION: [facility]  PCP: Roxy Manns, MD   Code Status: Full Code  Goals of care: Advanced Directive information Advanced Directives 09/06/2016  Does Patient Have a Medical Advance Directive? Yes  Type of Advance Directive Living will  Does patient want to make changes to medical advance directive? No - Patient declined  Would patient like information on creating a medical advance directive? -  Pre-existing out of facility DNR order (yellow form or pink MOST form) -       Extended Emergency Contact Information Primary Emergency Contact: Monroe,Phyllis  [place] Home Phone: [phone] Relation: Daughter Secondary Emergency Contact: Reddy,Scott  [place] Home Phone: [phone] Relation: Son   Allergies  Allergen Reactions  . Tdap [Diphth-Acell Pertussis-Tetanus] Swelling  . Amlodipine Besylate     REACTION: edema  . Codeine     headache  . Hydrocodone     hallucinations  . Lovastatin     REACTION: leg pain  . Metronidazole Swelling    And body aches  . Penicillins Itching    Unknown reaction; has tolerated cephalosporins Has patient had a PCN reaction causing immediate rash, facial/tongue/throat swelling, SOB or lightheadedness with hypotension:unsure Has patient had a PCN reaction causing severe rash involving mucus membranes or skin necrosis:unsure Has patient had a PCN reaction that required hospitalization:unsure Has patient had a PCN reaction occurring within the last 10 years:No If all of the above answers are "NO", then may proceed with Cephalosporin use.   . Sertraline Hcl     REACTION: caused insomnia  . Simvastatin     REACTION: non tolerant- leg pain  . Latex Rash  . Sulfonamide Derivatives Rash    Chief Complaint  Patient presents with  . New Admit To SNF    New Admission Visit      HPI:  Patient is a 83 y.o. female seen today for short term rehabilitation post hospital admission from  09/06/2016-09/09/2016 with acute metabolic encephalopathy secondary to urinary tract infection along with dehydration and hyponatremia. CAT scan of the head was negative for acute intracranial abnormalities. She required IV fluids and IV antibiotics. She is seen in her room today. She has medical history of hypertension, GERD, osteoarthritis among others.  Review of Systems:  Constitutional: Negative for fever, chills, diaphoresis. Energy level is fair.  HENT: Negative for headache, congestion, nasal discharge, sore throat, difficulty swallowing.   Eyes: Negative for eye pain, blurred vision, double vision and discharge.  Respiratory: Negative for cough, shortness of breath and wheezing.   Cardiovascular: Negative for chest pain, palpitations, leg swelling.  Gastrointestinal: Negative for heartburn, nausea, vomiting, abdominal pain, loss of appetite, melena, diarrhea and constipation. Last bowel movement was yesterday.  Genitourinary: Negative for dysuria and flank pain.  Musculoskeletal: Negative for back pain, fall in the facility.  Skin: Negative for itching, rash.  Neurological: Negative for dizziness. Psychiatric/Behavioral: Negative for depression.   Past Medical History:  Diagnosis Date  . Chronic foot pain    mortons neuroma  . Complication of anesthesia   . Cystocele 01/01   neg. sx  . fracture fibula ? 2003   left  . GERD (gastroesophageal reflux disease)   . Hemorrhoids   . Hyperlipemia 05/31/1992  . Hypertension 05/31/2000  . Menopausal symptoms    vasomotor symptoms-severe  . Osteoarthritis   . Osteopenia 11/29/1999  . Pedal edema    worsened by Norvasc  . Plantar fasciitis   . PONV (postoperative nausea and vomiting)  Past Surgical History:  Procedure Laterality Date  . Anterior repari w//surg proc  06/30/99   RSO  . BLADDER SUSPENSION    . BLADDER TACK    . CATARACTS    . Foot problems     neuromas  . history of CT     mass right adnexa, U?S by  GYN-observation  . KYPHOPLASTY N/A 09/04/2012   Procedure: T7, T8,T12 Kyphoplasty ;  Surgeon: Clydene Fake, MD;  Location: [facility];  Service: Neurosurgery;  Laterality: N/A;  thoracic seven,eight, and twelve   . PARTIAL HYSTERECTOMY  1970   endometriosis   Social History:   reports that she has never smoked. She has never used smokeless tobacco. She reports that she does not drink alcohol or use drugs.  Family History  Problem Relation Age of Onset  . Diabetes Brother   . Hypertension Other   . Diabetes Mother   . Heart disease Mother     pacemaker  . Hypertension Mother   . COPD Father   . Diabetes Sister   . Mental illness Son     anxiety-mental health problems  . Diabetes Brother   . Diabetes Sister   . Diabetes Sister     Medications: Allergies as of 09/10/2016      Reactions   Tdap [diphth-acell Pertussis-tetanus] Swelling   Amlodipine Besylate    REACTION: edema   Codeine    headache   Hydrocodone    hallucinations   Lovastatin    REACTION: leg pain   Metronidazole Swelling   And body aches   Penicillins Itching   Unknown reaction; has tolerated cephalosporins Has patient had a PCN reaction causing immediate rash, facial/tongue/throat swelling, SOB or lightheadedness with hypotension:unsure Has patient had a PCN reaction causing severe rash involving mucus membranes or skin necrosis:unsure Has patient had a PCN reaction that required hospitalization:unsure Has patient had a PCN reaction occurring within the last 10 years:No If all of the above answers are "NO", then may proceed with Cephalosporin use.   Sertraline Hcl    REACTION: caused insomnia   Simvastatin    REACTION: non tolerant- leg pain   Latex Rash   Sulfonamide Derivatives Rash      Medication List       Accurate as of 09/10/16 10:38 AM. Always use your most recent med list.          azelastine 0.1 % nasal spray Commonly known as:  ASTELIN PLACE 2 SPRAYS INTO BOTH NOSTRILS 2 TIMES  DAILY.   cefUROXime 500 MG tablet Commonly known as:  CEFTIN Take 1 tablet (500 mg total) by mouth 2 (two) times daily with a meal.   diphenhydrAMINE 25 mg capsule Commonly known as:  BENADRYL Take 25 mg by mouth daily.   escitalopram 20 MG tablet Commonly known as:  LEXAPRO Take 1 tablet (20 mg total) by mouth daily.   fluticasone 50 MCG/ACT nasal spray Commonly known as:  FLONASE PLACE 1 TO 2 SPRAYS IN EACH NOSTRIL DAILY.   mometasone 0.1 % cream Commonly known as:  ELOCON Apply to affected area in small amount 3 times weekly as needed   omeprazole 20 MG capsule Commonly known as:  PRILOSEC TAKE 1 CAPSULE BY MOUTH 2 TIMES DAILY.   traMADol 50 MG tablet Commonly known as:  ULTRAM TAKE 1 TABLET BY MOUTH EVERY 8 HOURS AS NEEDED FOR SEVERE PAIN.   Vitamin D3 2000 units Tabs Take 1 tablet by mouth daily.  Immunizations: Immunization History  Administered Date(s) Administered  . Influenza Split 02/23/2011, 02/22/2012  . Influenza Whole 03/06/2007, 03/04/2008, 02/25/2010  . Influenza,inj,Quad PF,36+ Mos 02/08/2013, 02/11/2014, 02/25/2015, 03/17/2016  . PPD Test 09/09/2016  . Pneumococcal Conjugate-13 11/11/2015  . Pneumococcal Polysaccharide-23 02/11/2014     Physical Exam: Vitals:   09/10/16 1032  BP: 140/90  Pulse: 65  Resp: 19  Temp: 98.4 F (36.9 C)  TempSrc: Oral  SpO2: 97%  Weight: 133 lb (60.3 kg)  Height:  (1.499 m)   Body mass index is 26.86 kg/m.  General- elderly female, well built, in no acute distress Head- normocephalic, atraumatic Nose- no nasal discharge Throat- moist mucus membrane, normal oropharynx, has dentures Eyes- PERRLA, EOMI, no pallor, no icterus, no discharge, normal conjunctiva, normal sclera Neck- no cervical lymphadenopathy Cardiovascular- normal s1,s2, no murmur Respiratory- bilateral clear to auscultation, no wheeze, no rhonchi, no crackles, no use of accessory muscles Abdomen- bowel sounds present, soft,  non tender, no guarding or rigidity Musculoskeletal- able to move all 4 extremities, generalized weakness, trace leg edema, arthritis changes to her fingers Neurological- alert and oriented to person, place and time Skin- warm and dry Psychiatry- normal mood and affect    Labs reviewed: Basic Metabolic Panel:  Recent Labs  09/81/19 0459  09/07/16 0608 09/08/16 0633 09/09/16 0650  NA 134*  < > 136 136 136  K 3.8  < > 3.1* 3.6 3.7  CL 100*  < > 101 103 102  CO2 27  < > GLUCOSE 106*  < > 109* 109* 113*  BUN 15  < > CREATININE 0.97  < > 0.88 0.82 0.85  CALCIUM 8.7*  < > 8.7* 8.5* 8.4*  MG 2.0  --   --  1.6* 2.0  < > = values in this interval not displayed. Liver Function Tests:  Recent Labs  01/03/16 0459 03/29/16 1224 09/06/16 1901  AST 26 16 37  ALT ALKPHOS 53 59 114  BILITOT 0.6 0.5 0.4  PROT 6.2* 7.0 7.4  ALBUMIN 3.7 4.1 4.0   No results for input(s): LIPASE, AMYLASE in the last 8760 hours.  Recent Labs  01/01/16 2334  AMMONIA 16   CBC:  Recent Labs  11/10/15 1612 01/01/16 1937 01/01/16 2334 09/06/16 1656  WBC 10.2 10.0  --  8.6  NEUTROABS 6.6 5.9  --   --   HGB 13.9 14.5  --  13.3  HCT 42.0 41.0 41.0 39.3  MCV 92.2 87.8  --  87.5  PLT 337.0 307  --  260   Cardiac Enzymes:  Recent Labs  01/01/16 1937 01/02/16 0541 01/03/16 0459  CKTOTAL 696* 561* 225   BNP: Invalid input(s): POCBNP CBG:  Recent Labs  01/02/16 0725 01/03/16 0741  GLUCAP 107* 103*    Radiological Exams: Ct Head Wo Contrast  Result Date: 09/06/2016 CLINICAL DATA:  Initial evaluation for acute trauma, fall. EXAM: CT HEAD WITHOUT CONTRAST TECHNIQUE: Contiguous axial images were obtained from the base of the skull through the vertex without intravenous contrast. COMPARISON:  Prior CT from 01/01/2016. FINDINGS: Brain: Generalized age-related cerebral atrophy. Fairly advanced chronic microvascular ischemic disease noted, similar to previous.  Remote lacunar infarct noted within the left basal ganglia. No acute intracranial hemorrhage. No evidence for acute large vessel territory infarct. No mass lesion, midline shift or mass effect. No hydrocephalus. No extra-axial fluid collection. Vascular: No asymmetric hyperdense vessel. Scattered vascular calcifications noted within the  carotid siphons and distal vertebral arteries. Skull: Scalp soft tissues demonstrate no acute abnormality. Question small left facial contusion. Calvarium intact. Sinuses/Orbits: Globes and orbital soft tissues within normal limits. Patient status post lens extraction bilaterally. Paranasal sinuses are clear. No mastoid effusion. Other: None. IMPRESSION: 1. No acute intracranial process identified. 2. Question small left facial contusion. Correlation with physical exam recommended. 3. Stable atrophy with advanced chronic microvascular ischemic disease. Electronically Signed   By: Rise Mu M.D.   On: 09/06/2016 19:32    Assessment/Plan  Generalized weakness From physical deconditioning. Will have her work with physical therapy and occupational therapy team to help with gait training and muscle strengthening exercises.fall precautions. Skin care. Encourage to be out of bed.   Urinary tract infection Denies any symptoms this visit. Maintain hydration and perineal hygiene. Continue cefuroxime 500 mg every 12 hours until 09/13/2016.  Hyponatremia Status post IV fluids in the hospital. Monitor BMP and maintain hydration.  Allergic rhinitis Currently stable, continue Flonase and azelastine nasal spray  Chronic depression Mood appears stable, continue Lexapro 20 mg daily and monitor  GERD Denies any symptoms this visit. Continue home regimen omeprazole 20 mg twice a day and monitor  Vitamin D deficiency Continue vitamin D3 supplement 2000 units daily and all precautions to be taken.  Osteoarthritis Currently on tramadol 50 mg every 8 hours as needed for  pain. Continue vitamin D supplement and monitor clinically.   Goals of care: short term rehabilitation   Labs/tests ordered: cbc, bmp in one week  Family/ staff Communication: reviewed care plan with patient and nursing supervisor  I have spent greater than 50 minutes for this encounter which includes reviewing hospital records, addressing above mentioned concerns, reviewing care plan with patient, answering patient's concerns and counseling her.     Oneal Grout, MD Internal Medicine [facility] [address] [place], [place] [address] Cell Phone (Monday-Friday 8 am - 5 pm): [phone] On Call: [phone] and follow prompts after 5 pm and on weekends Office Phone: [phone] Office Fax: [phone]

## 2016-09-15 ENCOUNTER — Encounter: Payer: Self-pay | Admitting: Family

## 2016-09-15 ENCOUNTER — Non-Acute Institutional Stay (SKILLED_NURSING_FACILITY): Payer: PPO | Admitting: Family

## 2016-09-15 DIAGNOSIS — F418 Other specified anxiety disorders: Secondary | ICD-10-CM

## 2016-09-15 DIAGNOSIS — R600 Localized edema: Secondary | ICD-10-CM

## 2016-09-15 NOTE — Progress Notes (Signed)
Location:  [facility] Room Number: 1206 Place of Service:  SNF (31) Provider: Abrahan Fulmore FNP-C  Roxy Manns, MD  Patient Care Team: [name], MD as PCP - General  Extended Emergency Contact Information Primary Emergency Contact: [facility] Address: [address]          [place], [place] [address] Darden Amber of [place] Home Phone: [phone] Relation: Daughter Secondary Emergency Contact: Brentlinger,Scott  [place] of [place] Home Phone: [phone] Relation: Son  Code Status:  Full Code Goals of care: Advanced Directive information Advanced Directives 09/06/2016  Does Patient Have a Medical Advance Directive? Yes  Type of Advance Directive Living will  Does patient want to make changes to medical advance directive? No - Patient declined  Would patient like information on creating a medical advance directive? -  Pre-existing out of facility DNR order (yellow form or pink MOST form) -     Chief Complaint  Patient presents with  . Acute Visit     edema     HPI:  Pt is a 83 y.o. female seen today at [facility] and [facility] for an acute visit for evaluation of lower extremities edema.She is seen in her room today. She reports worsening lower extremities. Facility staff also states patient seems to be anxietous at times. She is currently on Lexapro 20 mg tablet daily. She states has taken anxiety medication in the past but does not recall the medication name.she  Would like to wait for her daughter to check previous records from her PCP. She recently completed antibiotics for UTI. States had a itching rash but now resolved.     Past Medical History:  Diagnosis Date  . Chronic foot pain    mortons neuroma  . Complication of anesthesia   . Cystocele 01/01   neg. sx  . fracture fibula ? 2003   left  . GERD (gastroesophageal reflux disease)   . Hemorrhoids   . Hyperlipemia 05/31/1992  . Hypertension 05/31/2000   . Menopausal symptoms    vasomotor symptoms-severe  . Osteoarthritis   . Osteopenia 11/29/1999  . Pedal edema    worsened by Norvasc  . Plantar fasciitis   . PONV (postoperative nausea and vomiting)    Past Surgical History:  Procedure Laterality Date  . Anterior repari w//surg proc  06/30/99   RSO  . BLADDER SUSPENSION    . BLADDER TACK    . CATARACTS    . Foot problems     neuromas  . history of CT     mass right adnexa, U?S by GYN-observation  . KYPHOPLASTY N/A 09/04/2012   Procedure: T7, T8,T12 Kyphoplasty ;  Surgeon: Clydene Fake, MD;  Location: [facility];  Service: Neurosurgery;  Laterality: N/A;  thoracic seven,eight, and twelve   . PARTIAL HYSTERECTOMY  1970   endometriosis    Allergies  Allergen Reactions  . Tdap [Diphth-Acell Pertussis-Tetanus] Swelling  . Amlodipine Besylate     REACTION: edema  . Codeine     headache  . Hydrocodone     hallucinations  . Lovastatin     REACTION: leg pain  . Metronidazole Swelling    And body aches  . Penicillins Itching    Unknown reaction; has tolerated cephalosporins Has patient had a PCN reaction causing immediate rash, facial/tongue/throat swelling, SOB or lightheadedness with hypotension:unsure Has patient had a PCN reaction causing severe rash involving mucus membranes or skin necrosis:unsure Has patient had a PCN reaction that  required hospitalization:unsure Has patient had a PCN reaction occurring within the last 10 years:No If all of the above answers are "NO", then may proceed with Cephalosporin use.   . Sertraline Hcl     REACTION: caused insomnia  . Simvastatin     REACTION: non tolerant- leg pain  . Latex Rash  . Sulfonamide Derivatives Rash    Allergies as of 09/15/2016      Reactions   Tdap [diphth-acell Pertussis-tetanus] Swelling   Amlodipine Besylate    REACTION: edema   Codeine    headache   Hydrocodone    hallucinations   Lovastatin    REACTION: leg pain   Metronidazole Swelling    And body aches   Penicillins Itching   Unknown reaction; has tolerated cephalosporins Has patient had a PCN reaction causing immediate rash, facial/tongue/throat swelling, SOB or lightheadedness with hypotension:unsure Has patient had a PCN reaction causing severe rash involving mucus membranes or skin necrosis:unsure Has patient had a PCN reaction that required hospitalization:unsure Has patient had a PCN reaction occurring within the last 10 years:No If all of the above answers are "NO", then may proceed with Cephalosporin use.   Sertraline Hcl    REACTION: caused insomnia   Simvastatin    REACTION: non tolerant- leg pain   Latex Rash   Sulfonamide Derivatives Rash      Medication List       Accurate as of 09/15/16 10:40 PM. Always use your most recent med list.          azelastine 0.1 % nasal spray Commonly known as:  ASTELIN PLACE 2 SPRAYS INTO BOTH NOSTRILS 2 TIMES DAILY.   diphenhydrAMINE 25 mg capsule Commonly known as:  BENADRYL Take 25 mg by mouth daily.   escitalopram 20 MG tablet Commonly known as:  LEXAPRO Take 1 tablet (20 mg total) by mouth daily.   fluticasone 50 MCG/ACT nasal spray Commonly known as:  FLONASE PLACE 1 TO 2 SPRAYS IN EACH NOSTRIL DAILY.   mometasone 0.1 % cream Commonly known as:  ELOCON Apply to affected area in small amount 3 times weekly as needed   omeprazole 20 MG capsule Commonly known as:  PRILOSEC TAKE 1 CAPSULE BY MOUTH 2 TIMES DAILY.   traMADol 50 MG tablet Commonly known as:  ULTRAM TAKE 1 TABLET BY MOUTH EVERY 8 HOURS AS NEEDED FOR SEVERE PAIN.   Vitamin D3 2000 units Tabs Take 1 tablet by mouth daily.       Review of Systems  Constitutional: Negative for activity change, appetite change, chills, fatigue and fever.  HENT: Negative for congestion, rhinorrhea, sinus pain, sinus pressure, sneezing and sore throat.   Eyes: Negative.   Respiratory: Negative for cough, chest tightness, shortness of breath, wheezing and  stridor.   Cardiovascular: Positive for leg swelling. Negative for chest pain and palpitations.  Gastrointestinal: Negative for abdominal distention, abdominal pain, constipation, diarrhea, nausea and vomiting.  Genitourinary: Negative for dysuria, flank pain, frequency and urgency.  Musculoskeletal: Positive for gait problem.  Skin: Negative for color change, pallor and rash.  Neurological: Negative for dizziness, seizures, syncope, light-headedness and headaches.  Psychiatric/Behavioral: Negative for agitation, confusion, hallucinations and sleep disturbance. The patient is nervous/anxious.     Immunization History  Administered Date(s) Administered  . Influenza Split 02/23/2011, 02/22/2012  . Influenza Whole 03/06/2007, 03/04/2008, 02/25/2010  . Influenza,inj,Quad PF,36+ Mos 02/08/2013, 02/11/2014, 02/25/2015, 03/17/2016  . PPD Test 09/09/2016  . Pneumococcal Conjugate-13 11/11/2015  . Pneumococcal Polysaccharide-23 02/11/2014   Pertinent  Health Maintenance Due  Topic Date Due  . MAMMOGRAM  08/10/2017 (Originally 08/10/2016)  . INFLUENZA VACCINE  12/29/2016  . DEXA SCAN  Completed  . PNA vac Low Risk Adult  Completed   Fall Risk  08/23/2016  Falls in the past year? Yes  Number falls in past yr: 1  Injury with Fall? Yes    Vitals:   09/15/16 1413  BP: (!) 146/96  Pulse: 85  Resp: 18  Temp: 97.4 F (36.3 C)  TempSrc: Oral  SpO2: 96%  Weight: 133 lb (60.3 kg)  Height:  (1.499 m)   Body mass index is 26.86 kg/m. Physical Exam  Constitutional: She is oriented to person, place, and time. She appears well-developed and well-nourished. No distress.  Elderly in no acute distress  HENT:  Head: Normocephalic.  Mouth/Throat: Oropharynx is clear and moist. No oropharyngeal exudate.  Eyes: Conjunctivae and EOM are normal. Pupils are equal, round, and reactive to light. Right eye exhibits no discharge. Left eye exhibits no discharge. No scleral icterus.  Neck: Normal  range of motion. No JVD present. No thyromegaly present.  Cardiovascular: Normal rate, regular rhythm, normal heart sounds and intact distal pulses.  Exam reveals no gallop and no friction rub.   No murmur heard. Pulmonary/Chest: Effort normal and breath sounds normal. No respiratory distress. She has no wheezes. She has no rales.  Abdominal: Soft. Bowel sounds are normal. She exhibits no distension. There is no tenderness. There is no rebound and no guarding.  Musculoskeletal: She exhibits no tenderness or deformity.  Moves x 4 extremities. Unsteady gait. Bilateral lower extremities 1-2+ edema.   Lymphadenopathy:    She has no cervical adenopathy.  Neurological: She is oriented to person, place, and time.  Skin: Skin is warm and dry. No rash noted. No erythema. No pallor.  Psychiatric: She has a normal mood and affect.    Labs reviewed:  Recent Labs  01/03/16 0459  09/07/16 0608 09/08/16 0633 09/09/16 0650  NA 134*  < > 136 136 136  K 3.8  < > 3.1* 3.6 3.7  CL 100*  < > 101 103 102  CO2 27  < > GLUCOSE 106*  < > 109* 109* 113*  BUN 15  < > CREATININE 0.97  < > 0.88 0.82 0.85  CALCIUM 8.7*  < > 8.7* 8.5* 8.4*  MG 2.0  --   --  1.6* 2.0  < > = values in this interval not displayed.  Recent Labs  01/03/16 0459 03/29/16 1224 09/06/16 1901  AST 26 16 37  ALT ALKPHOS 53 59 114  BILITOT 0.6 0.5 0.4  PROT 6.2* 7.0 7.4  ALBUMIN 3.7 4.1 4.0    Recent Labs  11/10/15 1612 01/01/16 1937 01/01/16 2334 09/06/16 1656  WBC 10.2 10.0  --  8.6  NEUTROABS 6.6 5.9  --   --   HGB 13.9 14.5  --  13.3  HCT 42.0 41.0 41.0 39.3  MCV 92.2 87.8  --  87.5  PLT 337.0 307  --  260   Lab Results  Component Value Date   TSH 4.182 09/07/2016   No results found for: HGBA1C Lab Results  Component Value Date   CHOL 431 (H) 02/11/2014   HDL 39.40 02/11/2014   LDLDIRECT 344.0 02/11/2014   TRIG 325.0 (H) 02/11/2014   CHOLHDL 11 02/11/2014     Assessment/Plan  1. Localized edema Bilateral 1-2+ edema  to lower extremities. Apply knee high ted hose on in the morning and off at bedtime. Continue to monitor weight.   2. Depression with anxiety Facility Nurse reports anxiety.Patient would like to inquire previous medication given by PCP. Continue on Lexapro for now.will consult with Psych.        Family/ staff Communication: Reviewed plan of care with patient and facility Nurse supervisor  Labs/tests ordered: None   Caesar Bookman, NP

## 2016-09-17 LAB — CBC AND DIFFERENTIAL
HEMATOCRIT: 38 % (ref 36–46)
HEMOGLOBIN: 12.5 g/dL (ref 12.0–16.0)
PLATELETS: 412 10*3/uL — AB (ref 150–399)
WBC: 6.5 10*3/mL

## 2016-09-17 LAB — BASIC METABOLIC PANEL
BUN: 12 mg/dL (ref 4–21)
Creatinine: 0.8 mg/dL (ref 0.5–1.1)
Glucose: 99 mg/dL
Potassium: 4 mmol/L (ref 3.4–5.3)
Sodium: 142 mmol/L (ref 137–147)

## 2016-09-22 ENCOUNTER — Non-Acute Institutional Stay (SKILLED_NURSING_FACILITY): Payer: PPO | Admitting: Family

## 2016-09-22 ENCOUNTER — Encounter: Payer: Self-pay | Admitting: Family

## 2016-09-22 DIAGNOSIS — F329 Major depressive disorder, single episode, unspecified: Secondary | ICD-10-CM

## 2016-09-22 DIAGNOSIS — F419 Anxiety disorder, unspecified: Secondary | ICD-10-CM | POA: Diagnosis not present

## 2016-09-22 NOTE — Progress Notes (Signed)
Location:  [facility] Room Number: 1206 Place of Service:  SNF (31) Provider: Eiza Canniff FNP-C  Roxy Manns, MD  Patient Care Team: [name], MD as PCP - General  Extended Emergency Contact Information Primary Emergency Contact: [facility] Address: [address]          [place], [place] [address] Darden Amber of [place] Home Phone: [phone] Relation: Daughter Secondary Emergency Contact: Puccinelli,Scott  [place] Home Phone: [phone] Relation: Son  Code Status:  Full Code Goals of care: Advanced Directive information Advanced Directives 09/22/2016  Does Patient Have a Medical Advance Directive? No  Type of Advance Directive -  Does patient want to make changes to medical advance directive? -  Would patient like information on creating a medical advance directive? -  Pre-existing out of facility DNR order (yellow form or pink MOST form) -     Chief Complaint  Patient presents with  . Acute Visit    Resident is being seen due to increased confusion per patient's daughter     HPI:  Pt is a 83 y.o. female seen today at [facility] and [facility] for an acute visit for evaluation of medications.She is seen in her room today per patient's daughter request.Family friend at the bedside visit.Spoke with patient's daughter Lorriane Shire at [phone] with facility Nurse supervisor present in the room. Patient's daughter states visited patient several times this week and noticed that patient seems to be more lethargic and confused. She states this symptoms have worsen since patient was started on Celexa.POA states she read the medication side effects on the Internet and would like the medication to be discontinued. Provider discussed with patient's POA that the medication cannot be stopped abruptly but can be wean off. POA in agreement with Pysch service evaluation and wean off medication. Of note facility staff reports  patient is alert and oriented and continues to participate in therapy without any concerns.during this visit patient states no acute issues. Also states completed therapy then went by another residence room ( who are neighbors at home) to visit and talk before wheeling herself  Back to her room.    Past Medical History:  Diagnosis Date  . Chronic foot pain    mortons neuroma  . Complication of anesthesia   . Cystocele 01/01   neg. sx  . fracture fibula ? 2003   left  . GERD (gastroesophageal reflux disease)   . Hemorrhoids   . Hyperlipemia 05/31/1992  . Hypertension 05/31/2000  . Menopausal symptoms    vasomotor symptoms-severe  . Osteoarthritis   . Osteopenia 11/29/1999  . Pedal edema    worsened by Norvasc  . Plantar fasciitis   . PONV (postoperative nausea and vomiting)    Past Surgical History:  Procedure Laterality Date  . Anterior repari w//surg proc  06/30/99   RSO  . BLADDER SUSPENSION    . BLADDER TACK    . CATARACTS    . Foot problems     neuromas  . history of CT     mass right adnexa, U?S by GYN-observation  . KYPHOPLASTY N/A 09/04/2012   Procedure: T7, T8,T12 Kyphoplasty ;  Surgeon: Clydene Fake, MD;  Location: [facility];  Service: Neurosurgery;  Laterality: N/A;  thoracic seven,eight, and twelve   . PARTIAL HYSTERECTOMY  1970   endometriosis    Allergies  Allergen Reactions  . Tdap [Diphth-Acell Pertussis-Tetanus] Swelling  . Amlodipine Besylate     REACTION: edema  .  Codeine     headache  . Hydrocodone     hallucinations  . Lovastatin     REACTION: leg pain  . Metronidazole Swelling    And body aches  . Penicillins Itching    Unknown reaction; has tolerated cephalosporins Has patient had a PCN reaction causing immediate rash, facial/tongue/throat swelling, SOB or lightheadedness with hypotension:unsure Has patient had a PCN reaction causing severe rash involving mucus membranes or skin necrosis:unsure Has patient had a PCN reaction that  required hospitalization:unsure Has patient had a PCN reaction occurring within the last 10 years:No If all of the above answers are "NO", then may proceed with Cephalosporin use.   . Sertraline Hcl     REACTION: caused insomnia  . Simvastatin     REACTION: non tolerant- leg pain  . Latex Rash  . Sulfonamide Derivatives Rash    Allergies as of 09/22/2016      Reactions   Tdap [diphth-acell Pertussis-tetanus] Swelling   Amlodipine Besylate    REACTION: edema   Codeine    headache   Hydrocodone    hallucinations   Lovastatin    REACTION: leg pain   Metronidazole Swelling   And body aches   Penicillins Itching   Unknown reaction; has tolerated cephalosporins Has patient had a PCN reaction causing immediate rash, facial/tongue/throat swelling, SOB or lightheadedness with hypotension:unsure Has patient had a PCN reaction causing severe rash involving mucus membranes or skin necrosis:unsure Has patient had a PCN reaction that required hospitalization:unsure Has patient had a PCN reaction occurring within the last 10 years:No If all of the above answers are "NO", then may proceed with Cephalosporin use.   Sertraline Hcl    REACTION: caused insomnia   Simvastatin    REACTION: non tolerant- leg pain   Latex Rash   Sulfonamide Derivatives Rash      Medication List       Accurate as of 09/22/16  9:51 PM. Always use your most recent med list.          azelastine 0.1 % nasal spray Commonly known as:  ASTELIN PLACE 2 SPRAYS INTO BOTH NOSTRILS 2 TIMES DAILY.   diphenhydrAMINE 25 mg capsule Commonly known as:  BENADRYL Take 25 mg by mouth daily.   escitalopram 20 MG tablet Commonly known as:  LEXAPRO Take 1 tablet (20 mg total) by mouth daily.   fluticasone 50 MCG/ACT nasal spray Commonly known as:  FLONASE PLACE 1 TO 2 SPRAYS IN EACH NOSTRIL DAILY.   mometasone 0.1 % cream Commonly known as:  ELOCON Apply to affected area in small amount 3 times weekly as needed     omeprazole 20 MG capsule Commonly known as:  PRILOSEC TAKE 1 CAPSULE BY MOUTH 2 TIMES DAILY.   traMADol 50 MG tablet Commonly known as:  ULTRAM TAKE 1 TABLET BY MOUTH EVERY 8 HOURS AS NEEDED FOR SEVERE PAIN.   Vitamin D3 2000 units Tabs Take 1 tablet by mouth daily.       Review of Systems  Constitutional: Negative for activity change, appetite change, chills, fatigue and fever.  HENT: Negative for congestion, rhinorrhea, sinus pain, sinus pressure, sneezing and sore throat.   Eyes: Negative.   Respiratory: Negative for cough, chest tightness, shortness of breath, wheezing and stridor.   Cardiovascular: Negative for chest pain, palpitations and leg swelling.  Gastrointestinal: Negative for abdominal distention, abdominal pain, constipation, diarrhea, nausea and vomiting.  Endocrine: Negative.   Genitourinary: Negative for dysuria, flank pain, frequency and urgency.  Musculoskeletal:  Positive for gait problem.  Skin: Negative for color change, pallor and rash.  Neurological: Negative for dizziness, seizures, syncope, light-headedness and headaches.  Hematological: Does not bruise/bleed easily.  Psychiatric/Behavioral: Negative for agitation, confusion, hallucinations and sleep disturbance. The patient is not nervous/anxious.     Immunization History  Administered Date(s) Administered  . Influenza Split 02/23/2011, 02/22/2012  . Influenza Whole 03/06/2007, 03/04/2008, 02/25/2010  . Influenza,inj,Quad PF,36+ Mos 02/08/2013, 02/11/2014, 02/25/2015, 03/17/2016  . PPD Test 09/09/2016  . Pneumococcal Conjugate-13 11/11/2015  . Pneumococcal Polysaccharide-23 02/11/2014   Pertinent  Health Maintenance Due  Topic Date Due  . MAMMOGRAM  08/10/2017 (Originally 08/10/2016)  . INFLUENZA VACCINE  12/29/2016  . DEXA SCAN  Completed  . PNA vac Low Risk Adult  Completed   Fall Risk  08/23/2016  Falls in the past year? Yes  Number falls in past yr: 1  Injury with Fall? Yes    Vitals:    09/22/16 1039  BP: 134/66  Pulse: 82  Resp: 20  Temp: 97.7 F (36.5 C)  SpO2: 96%  Weight: 125 lb 1.6 oz (56.7 kg)  Height:  (1.499 m)   Body mass index is 25.27 kg/m. Physical Exam  Constitutional: She is oriented to person, place, and time. She appears well-developed and well-nourished.  Elderly in no acute distress  HENT:  Head: Normocephalic.  Mouth/Throat: Oropharynx is clear and moist. No oropharyngeal exudate.  Eyes: Conjunctivae and EOM are normal. Pupils are equal, round, and reactive to light. Right eye exhibits no discharge. Left eye exhibits no discharge. No scleral icterus.  Neck: Normal range of motion. No JVD present. No thyromegaly present.  Cardiovascular: Normal rate, regular rhythm, normal heart sounds and intact distal pulses.  Exam reveals no gallop and no friction rub.   No murmur heard. Pulmonary/Chest: Effort normal and breath sounds normal. No respiratory distress. She has no wheezes. She has no rales.  Abdominal: Soft. Bowel sounds are normal. She exhibits no distension. There is no tenderness. There is no rebound and no guarding.  Musculoskeletal: She exhibits no tenderness or deformity.  Moves x 4 extremities. Unsteady gait. Bilateral lower extremities knee high Ted hose in place.   Lymphadenopathy:    She has no cervical adenopathy.  Neurological: She is oriented to person, place, and time.  Skin: Skin is warm and dry. No rash noted. No erythema. No pallor.  Psychiatric: She has a normal mood and affect.    Labs reviewed:  Recent Labs  01/03/16 0459  09/07/16 0608 09/08/16 0633 09/09/16 0650  NA 134*  < > 136 136 136  K 3.8  < > 3.1* 3.6 3.7  CL 100*  < > 101 103 102  CO2 27  < > GLUCOSE 106*  < > 109* 109* 113*  BUN 15  < > CREATININE 0.97  < > 0.88 0.82 0.85  CALCIUM 8.7*  < > 8.7* 8.5* 8.4*  MG 2.0  --   --  1.6* 2.0  < > = values in this interval not displayed.  Recent Labs  01/03/16 0459  03/29/16 1224 09/06/16 1901  AST 26 16 37  ALT ALKPHOS 53 59 114  BILITOT 0.6 0.5 0.4  PROT 6.2* 7.0 7.4  ALBUMIN 3.7 4.1 4.0    Recent Labs  11/10/15 1612 01/01/16 1937 01/01/16 2334 09/06/16 1656  WBC 10.2 10.0  --  8.6  NEUTROABS 6.6 5.9  --   --  HGB 13.9 14.5  --  13.3  HCT 42.0 41.0 41.0 39.3  MCV 92.2 87.8  --  87.5  PLT 337.0 307  --  260   Lab Results  Component Value Date   TSH 4.182 09/07/2016   No results found for: HGBA1C Lab Results  Component Value Date   CHOL 431 (H) 02/11/2014   HDL 39.40 02/11/2014   LDLDIRECT 344.0 02/11/2014   TRIG 325.0 (H) 02/11/2014   CHOLHDL 11 02/11/2014    Assessment/Plan Depression with anxiety Stable.Participating in therapy and interacts with staff and other residence.Patient's daughter voices concerns with current dose of Lexapro. Psych service to evaluate medication and wean off as tolerated.    Family/ staff Communication: Reviewed plan of care with patient and facility Nurse supervisor  Labs/tests ordered: None   Caesar Bookman, NP

## 2016-09-28 DIAGNOSIS — M1991 Primary osteoarthritis, unspecified site: Secondary | ICD-10-CM | POA: Diagnosis not present

## 2016-09-28 DIAGNOSIS — R2681 Unsteadiness on feet: Secondary | ICD-10-CM | POA: Diagnosis not present

## 2016-09-28 DIAGNOSIS — Z5189 Encounter for other specified aftercare: Secondary | ICD-10-CM | POA: Diagnosis not present

## 2016-09-28 DIAGNOSIS — G9341 Metabolic encephalopathy: Secondary | ICD-10-CM | POA: Diagnosis not present

## 2016-09-28 DIAGNOSIS — M6281 Muscle weakness (generalized): Secondary | ICD-10-CM | POA: Diagnosis not present

## 2016-09-28 DIAGNOSIS — R488 Other symbolic dysfunctions: Secondary | ICD-10-CM | POA: Diagnosis not present

## 2016-09-28 DIAGNOSIS — R41841 Cognitive communication deficit: Secondary | ICD-10-CM | POA: Diagnosis not present

## 2016-10-05 ENCOUNTER — Ambulatory Visit (INDEPENDENT_AMBULATORY_CARE_PROVIDER_SITE_OTHER): Payer: PPO | Admitting: Podiatry

## 2016-10-05 ENCOUNTER — Encounter: Payer: Self-pay | Admitting: Podiatry

## 2016-10-05 VITALS — BP 176/89 | HR 66

## 2016-10-05 DIAGNOSIS — B351 Tinea unguium: Secondary | ICD-10-CM

## 2016-10-05 DIAGNOSIS — Q828 Other specified congenital malformations of skin: Secondary | ICD-10-CM | POA: Diagnosis not present

## 2016-10-05 DIAGNOSIS — M79676 Pain in unspecified toe(s): Secondary | ICD-10-CM | POA: Diagnosis not present

## 2016-10-05 NOTE — Progress Notes (Addendum)
   Subjective:    Patient ID: Amber Rollins, female    DOB: [birth year], 83 y.o.   MRN: [id number]  HPI this patient presents the office for preventative foot care services. She has long thick painful nails and a painful callus on the bottom of her right foot. [facility] but she presents the office today for her continued foot treatment. She presents the office wearing compression socks for swelling in her legs and feet.  She says that her nails have grown thick and long and are painful wearing shoes. She also has a painful callus on the bottom of her right foot which is painful. She presents the office today for an evaluation and treatment of her painful nails and callus.  She says she has severe hammer toe right foot which she already has scheduled appointment with orthopedic doctor.    Review of Systems  Musculoskeletal: Positive for gait problem.       Objective:   Physical Exam GENERAL APPEARANCE: Alert, conversant. Appropriately groomed. No acute distress.  VASCULAR: Pedal pulses are not   palpable at  [facility] bilateral due to excessive swelling and her compression socks.  Capillary refill diminished.  Cold feet noted. NEUROLOGIC: Deferred at this visit due to compression socks.  MUSCULOSKELETAL: acceptable muscle strength, tone and stability bilateral.  Intrinsic muscluature intact bilateral.  Hammer toe right foot second digit.    DERMATOLOGIC: skin color, texture, and turgor are within normal limits.  No preulcerative lesions or ulcers  are seen, no interdigital maceration noted.  No open lesions present.  Porokeratotic lesion sub 2 right foot. No drainage noted.  NAILS  Thick disfigured discolored nails both feet.        Assessment & Plan:  Onychomycosis  B/L  Porokeratosis right foot.  IE  Debridemnt of nails.  Debridement of porokeratosis right foot.  RTC 3 months. Padding dispensed for second toe right foot.   Helane GuntherGregory  Noely Kuhnle DPM

## 2016-10-07 ENCOUNTER — Encounter: Payer: Self-pay | Admitting: Family

## 2016-10-07 ENCOUNTER — Non-Acute Institutional Stay (SKILLED_NURSING_FACILITY): Payer: PPO | Admitting: Family

## 2016-10-07 DIAGNOSIS — I10 Essential (primary) hypertension: Secondary | ICD-10-CM

## 2016-10-07 DIAGNOSIS — J301 Allergic rhinitis due to pollen: Secondary | ICD-10-CM | POA: Diagnosis not present

## 2016-10-07 DIAGNOSIS — R2681 Unsteadiness on feet: Secondary | ICD-10-CM | POA: Diagnosis not present

## 2016-10-07 DIAGNOSIS — K219 Gastro-esophageal reflux disease without esophagitis: Secondary | ICD-10-CM

## 2016-10-07 NOTE — Progress Notes (Signed)
Location:  [facility] Room Number: [phone] Place of Service:  SNF (31)  Provider: Richarda Blade FNP-C   PCP: [name], MD Patient Care Team: Tower, Audrie Gallus, MD as PCP - General  Extended Emergency Contact Information Primary Emergency Contact: [facility] Address: [address]          [place], [place] [address] [place] of [place] Home Phone: [phone] Relation: Daughter Secondary Emergency Contact: Moret,Scott  [place] of [place] Home Phone: [phone] Relation: Son  Code Status: Full code  Goals of care:  Advanced Directive information Advanced Directives 10/07/2016  Does Patient Have a Medical Advance Directive? No  Type of Advance Directive -  Does patient want to make changes to medical advance directive? -  Would patient like information on creating a medical advance directive? -  Pre-existing out of facility DNR order (yellow form or pink MOST form) -     Allergies  Allergen Reactions  . Tdap [Diphth-Acell Pertussis-Tetanus] Swelling  . Amlodipine Besylate     REACTION: edema  . Codeine     headache  . Hydrocodone     hallucinations  . Lovastatin     REACTION: leg pain  . Metronidazole Swelling    And body aches  . Penicillins Itching    Unknown reaction; has tolerated cephalosporins Has patient had a PCN reaction causing immediate rash, facial/tongue/throat swelling, SOB or lightheadedness with hypotension:unsure Has patient had a PCN reaction causing severe rash involving mucus membranes or skin necrosis:unsure Has patient had a PCN reaction that required hospitalization:unsure Has patient had a PCN reaction occurring within the last 10 years:No If all of the above answers are "NO", then may proceed with Cephalosporin use.   . Sertraline Hcl     REACTION: caused insomnia  . Simvastatin     REACTION: non tolerant- leg pain  . Latex Rash  . Sulfonamide Derivatives Rash    Chief Complaint    Patient presents with  . Discharge Note    Discharge from [facility]    HPI:  83 y.o. female seen today at [facility] for discharge home. She was here for short term rehabilitation for post hospital admission from 09/06/2016-09/09/2016 with acute metabolic encephalopathy secondary to urinary tract infection along with dehydration and hyponatremia. CAT scan of the head was negative for acute intracranial abnormalities. She required IV fluids and IV antibiotics.She has a medical history of HTN, osteoarthritis, GERD among other conditions.She is seen in her room today. She denies any acute issues this visit.during her stay here in Rehab, she was seen by Psychiatry service. Her Lexapro was discontinued per patient's daughter request " Patient was more confused while on lexapro".Her benadrly was also discontinued.    She has worked well with PT/OT now stable for discharge home.She will be discharged home with outpatient Therapy at [facility]  to continue with ROM, Exercise, Gait stability and muscle strengthening. She does not require any DME has own FWW and WC at home. Home health services will be arranged by facility social worker prior to discharge.she will be discharged home with her medication from the facility. Prescription medication will be written x 1 month then patient to follow up with PCP in 1-2 weeks. Facility staff report no new concerns.  Past Medical History:  Diagnosis Date  . Chronic foot pain    mortons neuroma  . Complication of anesthesia   . Cystocele 01/01   neg. sx  .  fracture fibula ? 2003   left  . GERD (gastroesophageal reflux disease)   . Hemorrhoids   . Hyperlipemia 05/31/1992  . Hypertension 05/31/2000  . Menopausal symptoms    vasomotor symptoms-severe  . Osteoarthritis   . Osteopenia 11/29/1999  . Pedal edema    worsened by Norvasc  . Plantar fasciitis   . PONV (postoperative nausea and vomiting)      Past Surgical History:  Procedure Laterality Date  . Anterior repari w//surg proc  06/30/99   RSO  . BLADDER SUSPENSION    . BLADDER TACK    . CATARACTS    . Foot problems     neuromas  . history of CT     mass right adnexa, U?S by GYN-observation  . KYPHOPLASTY N/A 09/04/2012   Procedure: T7, T8,T12 Kyphoplasty ;  Surgeon: Clydene Fake, MD;  Location: [facility];  Service: Neurosurgery;  Laterality: N/A;  thoracic seven,eight, and twelve   . PARTIAL HYSTERECTOMY  1970   endometriosis      reports that she has never smoked. She has never used smokeless tobacco. She reports that she does not drink alcohol or use drugs. Social History   Social History  . Marital status: Widowed    Spouse name: N/A  . Number of children: N/A  . Years of education: N/A   Occupational History  . Not on file.   Social History Main Topics  . Smoking status: Never Smoker  . Smokeless tobacco: Never Used  . Alcohol use No  . Drug use: No  . Sexual activity: Not Currently   Other Topics Concern  . Not on file   Social History Narrative   Allergic to TD   Does some volunteer visits to nsg homes   Functional Status Survey:    Allergies  Allergen Reactions  . Tdap [Diphth-Acell Pertussis-Tetanus] Swelling  . Amlodipine Besylate     REACTION: edema  . Codeine     headache  . Hydrocodone     hallucinations  . Lovastatin     REACTION: leg pain  . Metronidazole Swelling    And body aches  . Penicillins Itching    Unknown reaction; has tolerated cephalosporins Has patient had a PCN reaction causing immediate rash, facial/tongue/throat swelling, SOB or lightheadedness with hypotension:unsure Has patient had a PCN reaction causing severe rash involving mucus membranes or skin necrosis:unsure Has patient had a PCN reaction that required hospitalization:unsure Has patient had a PCN reaction occurring within the last 10 years:No If all of the above answers are "NO", then may proceed  with Cephalosporin use.   . Sertraline Hcl     REACTION: caused insomnia  . Simvastatin     REACTION: non tolerant- leg pain  . Latex Rash  . Sulfonamide Derivatives Rash    Pertinent  Health Maintenance Due  Topic Date Due  . MAMMOGRAM  08/10/2017 (Originally 08/10/2016)  . INFLUENZA VACCINE  12/29/2016  . DEXA SCAN  Completed  . PNA vac Low Risk Adult  Completed    Medications: Allergies as of 10/07/2016      Reactions   Tdap [diphth-acell Pertussis-tetanus] Swelling   Amlodipine Besylate    REACTION: edema   Codeine    headache   Hydrocodone    hallucinations   Lovastatin    REACTION: leg pain   Metronidazole Swelling   And body aches   Penicillins Itching   Unknown reaction; has tolerated cephalosporins Has patient had a PCN reaction causing immediate rash, facial/tongue/throat  swelling, SOB or lightheadedness with hypotension:unsure Has patient had a PCN reaction causing severe rash involving mucus membranes or skin necrosis:unsure Has patient had a PCN reaction that required hospitalization:unsure Has patient had a PCN reaction occurring within the last 10 years:No If all of the above answers are "NO", then may proceed with Cephalosporin use.   Sertraline Hcl    REACTION: caused insomnia   Simvastatin    REACTION: non tolerant- leg pain   Latex Rash   Sulfonamide Derivatives Rash      Medication List       Accurate as of 10/07/16 11:32 PM. Always use your most recent med list.          azelastine 0.1 % nasal spray Commonly known as:  ASTELIN PLACE 2 SPRAYS INTO BOTH NOSTRILS 2 TIMES DAILY.   fluticasone 50 MCG/ACT nasal spray Commonly known as:  FLONASE PLACE 1 TO 2 SPRAYS IN EACH NOSTRIL DAILY.   mometasone 0.1 % cream Commonly known as:  ELOCON Apply to affected area in small amount 3 times weekly as needed   omeprazole 20 MG capsule Commonly known as:  PRILOSEC TAKE 1 CAPSULE BY MOUTH 2 TIMES DAILY.   traMADol 50 MG tablet Commonly known  as:  ULTRAM TAKE 1 TABLET BY MOUTH EVERY 8 HOURS AS NEEDED FOR SEVERE PAIN.   UNABLE TO FIND Take 60 mLs by mouth 2 (two) times daily. Med Name: Med Pass 2.0 (Document % consumed)   Vitamin D3 2000 units Tabs Take 1 tablet by mouth daily.       Review of Systems  Constitutional: Negative for activity change, appetite change, chills, fatigue and fever.  HENT: Negative for congestion, rhinorrhea, sinus pain, sinus pressure, sneezing and sore throat.   Eyes: Negative.   Respiratory: Negative for cough, chest tightness, shortness of breath, wheezing and stridor.   Cardiovascular: Negative for chest pain, palpitations and leg swelling.  Gastrointestinal: Negative for abdominal distention, abdominal pain, constipation, diarrhea, nausea and vomiting.  Endocrine: Negative.   Genitourinary: Negative for dysuria, flank pain, frequency and urgency.  Musculoskeletal: Positive for gait problem.  Skin: Negative for color change, pallor and rash.  Neurological: Negative for dizziness, seizures, syncope, light-headedness and headaches.  Hematological: Does not bruise/bleed easily.  Psychiatric/Behavioral: Negative for agitation, confusion, hallucinations and sleep disturbance. The patient is not nervous/anxious.     Vitals:   10/07/16 1221  BP: 100/70  Pulse: 72  Resp: 20  Temp: 98.2 F (36.8 C)  SpO2: 95%  Weight: 131 lb 12.8 oz (59.8 kg)  Height: 4\' 11"  (1.499 m)   Body mass index is 26.62 kg/m. Physical Exam  Constitutional: She is oriented to person, place, and time. She appears well-developed and well-nourished. No distress.  Elderly   HENT:  Head: Normocephalic.  Mouth/Throat: Oropharynx is clear and moist. No oropharyngeal exudate.  Eyes: Conjunctivae and EOM are normal. Pupils are equal, round, and reactive to light. Right eye exhibits no discharge. Left eye exhibits no discharge. No scleral icterus.  Neck: Normal range of motion. No JVD present. No thyromegaly present.   Cardiovascular: Normal rate, regular rhythm, normal heart sounds and intact distal pulses.  Exam reveals no gallop and no friction rub.   No murmur heard. Pulmonary/Chest: Effort normal and breath sounds normal. No respiratory distress. She has no wheezes. She has no rales.  Abdominal: Soft. Bowel sounds are normal. She exhibits no distension. There is no tenderness. There is no rebound and no guarding.  Musculoskeletal: She exhibits no tenderness  or deformity.  Moves x 4 extremities.Unsteady gait.   Lymphadenopathy:    She has no cervical adenopathy.  Neurological: She is oriented to person, place, and time.  Skin: Skin is warm and dry. No rash noted. No erythema. No pallor.  Psychiatric: She has a normal mood and affect.    Labs reviewed: Basic Metabolic Panel:  Recent Labs  16/10/96 0459  09/07/16 0608 09/08/16 0633 09/09/16 0650 09/17/16  NA 134*  < > 136 136 136 142  K 3.8  < > 3.1* 3.6 3.7 4.0  CL 100*  < > 101 103 102  --   CO2 27  < > 27 27 26   --   GLUCOSE 106*  < > 109* 109* 113*  --   BUN 15  < > 14 10 10 12   CREATININE 0.97  < > 0.88 0.82 0.85 0.8  CALCIUM 8.7*  < > 8.7* 8.5* 8.4*  --   MG 2.0  --   --  1.6* 2.0  --   < > = values in this interval not displayed. Liver Function Tests:  Recent Labs  01/03/16 0459 03/29/16 1224 09/06/16 1901  AST 26 16 37  ALT 16 10 23   ALKPHOS 53 59 114  BILITOT 0.6 0.5 0.4  PROT 6.2* 7.0 7.4  ALBUMIN 3.7 4.1 4.0    Recent Labs  01/01/16 2334  AMMONIA 16   CBC:  Recent Labs  11/10/15 1612 01/01/16 1937 01/01/16 2334 09/06/16 1656 09/17/16  WBC 10.2 10.0  --  8.6 6.5  NEUTROABS 6.6 5.9  --   --   --   HGB 13.9 14.5  --  13.3 12.5  HCT 42.0 41.0 41.0 39.3 38  MCV 92.2 87.8  --  87.5  --   PLT 337.0 307  --  260 412*   Cardiac Enzymes:  Recent Labs  01/01/16 1937 01/02/16 0541 01/03/16 0459  CKTOTAL 696* 561* 225    Recent Labs  01/02/16 0725 01/03/16 0741  GLUCAP 107* 103*   Assessment/Plan:     1. Unsteady gait  Has worked well with PT/ OT.She will be discharged home with outpatient Therapy at [facility]  to continue with ROM, Exercise, Gait stability and muscle strengthening. She does not require any DME has own FWW and WC at home. Fall and safety precautions.  2. Gastroesophageal reflux disease without esophagitis Stable. Continue on Omeprazole.   3. Allergic rhinitis  Continue on Fluticasone  And azelastine. Continue to monitor.   4. HTN  B/p stable. currently off medications. Monitor BMP   Patient is being discharged with the following home health services:   -None will be discharged home with outpatient Therapy at [facility]  to continue with ROM, Exercise, Gait stability and muscle strengthening.   Patient is being discharged with the following durable medical equipment:  - She does not require any DME has own FWW and WC at home.  Patient has been advised to f/u with their PCP in 1-2 weeks to for a transitions of care visit.Social services at their facility was responsible for arranging this appointment.  Pt was provided with adequate prescriptions of noncontrolled medications to reach the scheduled appointment.For controlled substances, a limited supply was provided as appropriate for the individual patient. If the pt normally receives these medications from a pain clinic or has a contract with another physician, these medications should be received from that clinic or physician only).    Future  labs/tests needed:  CBC, BMP in 1-2 weeks PCP

## 2016-10-11 ENCOUNTER — Telehealth: Payer: Self-pay | Admitting: Family Medicine

## 2016-10-11 NOTE — Telephone Encounter (Signed)
Letter is on  your desk.

## 2016-10-11 NOTE — Telephone Encounter (Signed)
Patient's daughter "Amber Rollins" dropped off letter for Dr. Milinda Antisower to view to catch her up on her mothers care. Amber Rollins would like Dr. Milinda Antisower to read the letter before she comes in to her appt on  May 21st 2018.  Placed in script tower.  Thank you,  -LLL

## 2016-10-11 NOTE — Telephone Encounter (Signed)
Aware, thanks!

## 2016-10-12 ENCOUNTER — Ambulatory Visit (INDEPENDENT_AMBULATORY_CARE_PROVIDER_SITE_OTHER): Payer: PPO | Admitting: Family Medicine

## 2016-10-12 ENCOUNTER — Encounter: Payer: Self-pay | Admitting: Family Medicine

## 2016-10-12 DIAGNOSIS — R410 Disorientation, unspecified: Secondary | ICD-10-CM | POA: Diagnosis not present

## 2016-10-12 DIAGNOSIS — E871 Hypo-osmolality and hyponatremia: Secondary | ICD-10-CM

## 2016-10-12 DIAGNOSIS — R4182 Altered mental status, unspecified: Secondary | ICD-10-CM | POA: Insufficient documentation

## 2016-10-12 DIAGNOSIS — W19XXXS Unspecified fall, sequela: Secondary | ICD-10-CM

## 2016-10-12 LAB — CBC WITH DIFFERENTIAL/PLATELET
BASOS ABS: 0.1 10*3/uL (ref 0.0–0.1)
Basophils Relative: 0.7 % (ref 0.0–3.0)
EOS ABS: 0.3 10*3/uL (ref 0.0–0.7)
Eosinophils Relative: 3.7 % (ref 0.0–5.0)
HEMATOCRIT: 41.6 % (ref 36.0–46.0)
HEMOGLOBIN: 13.8 g/dL (ref 12.0–15.0)
Lymphocytes Relative: 36.9 % (ref 12.0–46.0)
Lymphs Abs: 3.1 10*3/uL (ref 0.7–4.0)
MCHC: 33.1 g/dL (ref 30.0–36.0)
MCV: 91.3 fl (ref 78.0–100.0)
Monocytes Absolute: 0.7 10*3/uL (ref 0.1–1.0)
Monocytes Relative: 8.9 % (ref 3.0–12.0)
Neutro Abs: 4.1 10*3/uL (ref 1.4–7.7)
Neutrophils Relative %: 49.8 % (ref 43.0–77.0)
Platelets: 332 10*3/uL (ref 150.0–400.0)
RBC: 4.55 Mil/uL (ref 3.87–5.11)
RDW: 15.2 % (ref 11.5–15.5)
WBC: 8.3 10*3/uL (ref 4.0–10.5)

## 2016-10-12 LAB — COMPREHENSIVE METABOLIC PANEL
ALBUMIN: 4 g/dL (ref 3.5–5.2)
ALT: 11 U/L (ref 0–35)
AST: 16 U/L (ref 0–37)
Alkaline Phosphatase: 57 U/L (ref 39–117)
BUN: 28 mg/dL — ABNORMAL HIGH (ref 6–23)
CHLORIDE: 102 meq/L (ref 96–112)
CO2: 33 mEq/L — ABNORMAL HIGH (ref 19–32)
Calcium: 9.9 mg/dL (ref 8.4–10.5)
Creatinine, Ser: 1.06 mg/dL (ref 0.40–1.20)
GFR: 52.55 mL/min — ABNORMAL LOW (ref >60.00)
Glucose, Bld: 104 mg/dL — ABNORMAL HIGH (ref 70–99)
Potassium: 3.7 mEq/L (ref 3.5–5.1)
Sodium: 141 mEq/L (ref 135–145)
Total Bilirubin: 0.4 mg/dL (ref 0.2–1.2)
Total Protein: 6.8 g/dL (ref 6.0–8.3)

## 2016-10-12 LAB — POCT UA - MICROSCOPIC ONLY

## 2016-10-12 LAB — POC URINALSYSI DIPSTICK (AUTOMATED)
Bilirubin, UA: NEGATIVE
Glucose, UA: NEGATIVE
Ketones, UA: NEGATIVE
Leukocytes, UA: NEGATIVE
NITRITE UA: NEGATIVE
PROTEIN UA: NEGATIVE
Spec Grav, UA: >=1.03 — AB (ref 1.010–1.025)
UROBILINOGEN UA: 0.2 U/dL
pH, UA: 6 (ref 5.0–8.0)

## 2016-10-12 NOTE — Addendum Note (Signed)
Addended by: Damita LackLORING, Asma Boldon S on: 10/12/2016 02:19 PM   Modules accepted: Orders

## 2016-10-12 NOTE — Addendum Note (Signed)
Addended by: Kerby NoraBEDSOLE, Jacklynn Dehaas E on: 10/12/2016 02:38 PM   Modules accepted: Orders

## 2016-10-12 NOTE — Assessment & Plan Note (Addendum)
Will re-eva; UA given recent UTi resulting in AMS. No clear new cardiopulmonary issues.  Not on any meds contribting to confusion.  No clear S/S of new CVA/TIA on exam.  head CT was neg.  Will eval with labs and UA.  Before pt leaving son and daughter requested neuro referral.

## 2016-10-12 NOTE — Patient Instructions (Addendum)
[place] eldercare services if needing assistance.  Make sure she has full time observation at home until nursing home set up.  Increase fluids and start ensure or boost in additiona to meals.  Please stop at the lab to set up to have labs drawn. Please stop at the front desk to set up referral.  Keep appt as scheduled  With Dr. Milinda Antisower.

## 2016-10-12 NOTE — Assessment & Plan Note (Signed)
Likely recurred along with dehydration given decreased po.

## 2016-10-12 NOTE — Progress Notes (Signed)
Subjective:    Patient ID: Amber Rollins, female    DOB: [birth year], 83 y.o.   MRN: [id number]  HPI    83 year old female pt of Dr. Royden Purlower's with complicated past medical history including frequent falls, poor balance presents following a fall yesterday.  She comes to clinic today with her son who states he cannot care for her at home any longer. She is unsafe at home. Has nurse coming 6 hours. Son and daughter  Spending the night.  When son was gone for 2 hours and while he was gone she fell.   She remembers slipping down while using rolling walker. No associated dizziness, no CP, no headache.. Not sure why she was out of bed.  Slid to floor. She was leaning on wall when son founds her.. No clear head injury. Unsure of LOC.  She had recent hospitalization on  4/9 for generalized weakness and acute encephalopathy secondary to UTI, dehydration and hyponatremia.  Neg head CT, B12, folate nml, HIV and RPR neg TSH nml  Referred to PT and sent to SNF for rehab after that hospitalization, but pt was then discharged to home. After this there was no   Since this hospitalization.. She has continued to have confusion, disorientation. Continuous. Per son all these mental status changes have been pretty sudden in last month. No history of dementia. She is off the lexapro.. But MS  No changes. Not using tramadol.  Some incontinence of urine, wears depends at baseline. No urgency , no dysuria, no fever, no hematuria. No blood in stool.  decreased appetite, decreased liquid intake overall.    Review of Systems  Constitutional: Positive for fatigue. Negative for fever.  HENT: Negative for ear pain and sinus pain.   Eyes: Negative for pain.  Respiratory: Negative for shortness of breath and wheezing.   Cardiovascular: Negative for chest pain, palpitations and leg swelling.  Gastrointestinal: Negative for abdominal pain, constipation, diarrhea and nausea.  Neurological: Negative for  dizziness.  Psychiatric/Behavioral: Positive for confusion. The patient is nervous/anxious.        Objective:   Physical Exam  Constitutional: She is oriented to person, place, and time. Vital signs are normal. She appears well-developed and well-nourished. She is cooperative.  Non-toxic appearance. She has a sickly appearance. She does not appear ill. No distress.  HENT:  Head: Normocephalic.  Right Ear: Hearing, tympanic membrane, external ear and ear canal normal. Tympanic membrane is not erythematous, not retracted and not bulging.  Left Ear: Hearing, tympanic membrane, external ear and ear canal normal. Tympanic membrane is not erythematous, not retracted and not bulging.  Nose: No mucosal edema or rhinorrhea. Right sinus exhibits no maxillary sinus tenderness and no frontal sinus tenderness. Left sinus exhibits no maxillary sinus tenderness and no frontal sinus tenderness.  Mouth/Throat: Uvula is midline, oropharynx is clear and moist and mucous membranes are normal.  Eyes: Conjunctivae, EOM and lids are normal. Pupils are equal, round, and reactive to light. Lids are everted and swept, no foreign bodies found.  Neck: Trachea normal and normal range of motion. Neck supple. Carotid bruit is not present. No thyroid mass and no thyromegaly present.  Cardiovascular: Normal rate, regular rhythm, S1 normal, S2 normal, normal heart sounds, intact distal pulses and normal pulses.  Exam reveals no gallop and no friction rub.   No murmur heard. Pulmonary/Chest: Effort normal and breath sounds normal. No tachypnea. No respiratory distress. She has no decreased breath sounds. She has no wheezes. She  has no rhonchi. She has no rales.  Abdominal: Soft. Normal appearance and bowel sounds are normal. There is no tenderness.  Neurological: She is alert and oriented to person, place, and time. She displays atrophy. No cranial nerve deficit or sensory deficit. She exhibits abnormal muscle tone. Coordination  and gait abnormal.  4/5 throughout., but equal  Skin: Skin is warm, dry and intact. No rash noted.  hammer toe right foot, no acute sign of infeciton such as discharge, odor or redness.  Psychiatric: Her speech is normal and behavior is normal. Thought content normal. Her mood appears not anxious. Cognition and memory are impaired. She expresses impulsivity and inappropriate judgment. She does not exhibit a depressed mood.          Assessment & Plan:

## 2016-10-12 NOTE — Assessment & Plan Note (Signed)
Pt needs additional support unable to provide at home per son. Reocmmend eval of local SNF for longterm admission. FL2s to be brought to office by son.  Will given info on elder care services if needed.

## 2016-10-13 ENCOUNTER — Other Ambulatory Visit: Payer: Self-pay

## 2016-10-13 DIAGNOSIS — R2689 Other abnormalities of gait and mobility: Secondary | ICD-10-CM | POA: Diagnosis not present

## 2016-10-13 DIAGNOSIS — M6281 Muscle weakness (generalized): Secondary | ICD-10-CM | POA: Diagnosis not present

## 2016-10-13 NOTE — Patient Outreach (Signed)
[facility]) Care Management  10/13/2016  Amber Rollins [birth year] [id number]     Transition of Care Referral  Referral Date:  10/12/16 Referral Source: [facility] Discharge Report Date of Discharge: 10/09/16 [facility] Insurance: [facility]TA    Outreach attempt # 1 to patient. Spoke with patient.  Social: Patient resides in her home. She states that her son and dtr are rotating shifts and staying with her after they get off work. She voices that she has a "nurse Malvin Johnsadie' that is there with her during the day to assist with her needs. Patient reports that she requires some assistance with ADLs and IADLS. She does not drive and relies on her children to transport her to appts. Patient states she had had several falls within the past year. Last fall was a few days ago where she fell at night, She denies any injuries. She voices that she was set up with outpatient therapy and is to start today. DME in the home include rollator, wheelchair and shower chair.  Conditions: Patient has PMH of GERD,OA, HTN,HLD, chronic foot pain, freq falls, poor balance and confusion/disorientation with no history of dementia. Patient was hospitalized on 09/06/16 for generalized weakness, acute encephalopathy and UTI. Upon discharge [facility] and was discharged from there a few days ago.   Medications: Patient voices taking less than  10 meds. She reports that her dtr fills med planner for her weekly. She states that her pharmacy delivers her meds to her.  Appointments: Patient had PCP f/u appt on 10/12/16.  Advance Directives: None. Declined info.  Consent: [facility] reviewed and discussed. Patient seemed to get a little puzzled and confused but did give verbal consent. She gave okay for RN CM to contact one of her children (ROI on file for both son-Scott and dtr-Phyllis) to discuss [facility] services and make them aware. Patient requested RN CM contact dtr as her son  was currently out of town. RN CM placed call to dtr-Phyllis at [phone]. Discussed with dtr [facility] services. Dtr states that they could benefit from services and gave verbal consent. She reports that they are currently in the process of trying to figure out what type of placement patient is eligible ALF vs SNF. They are no longer able to provide 24/7 care in the home and patient is unsafe to stay in the home alone.  [facility] staff please coordinate home visits with either dtr-Phyllis or son-Scott due to patient's periods of confusion at times.    Plan: RN CM will notify [facility] administrative assistant of case status. RN CM will send [facility] and further in home eval/assessment of care needs. RN CM will send [facility] referral for possible assistance with placement for patient.   Antionette Fairyoshanda Joss Mcdill, RN,BSN,CCM [facility] Care Management Telephonic Care Management Coordinator Direct Phone: [phone] Toll Free: [phone] Fax: [phone]

## 2016-10-15 ENCOUNTER — Encounter: Payer: Self-pay | Admitting: *Deleted

## 2016-10-15 ENCOUNTER — Other Ambulatory Visit: Payer: Self-pay | Admitting: *Deleted

## 2016-10-15 NOTE — Patient Outreach (Addendum)
[facility] Care Management  10/15/2016  Lenn SinkShirley Anne Taul [birth year] [id number]   Phone call to patient's daughter [name] to discuss placement needs.  Per patient's daughter, due to safety reasons they are considering facility care for patient. Patient has long term care insurance and the family is currently working with a Elder Sales executiveLaw Attorney to coordinate patient's additional assets. Patient's daughter provided with the contact information for [facility] senior care advisory and consulting service as an option for assistance.. This Child psychotherapistsocial worker also available to assist. Patient has an appointment with her primary care doctor on Monday, FL2 will be faxed for completion at that time to determine level of care needs.   Plan: Patient's daughter would like a list of available memory care options           FL2 to be faxed to patient's provider's office to be completed during the appointment.  Late Entry list of Assisted Living Facilities emailed to patient's daughter.     Adriana ReamsChrystal Land, LCSW [facility] Care Management [phone]

## 2016-10-15 NOTE — Patient Outreach (Addendum)
[facility] Care Management [facility] CM Telephone Outreach, Transition of Care attempt #1  10/15/2016  Amber SinkShirley Anne Rollins [birth year] [id number]  Unsuccessful telephone outreach to Lorriane ShirePhyllis Monroe, daughter/ caregiver for Amber SinkShirley Anne Larios, 83 y/o female [facility] Tele[facility] CM for transition of care after recent hospitalization April 9-12, 2018 for acute encephalopathy, generalized weakness, dehydration and UTI.  Patient was discharged from hospital to SNF for rehabilitation and [facility]  Patient has history including, but not limited to, HTN, HLD, osteoarthritis, frequent falls, depression, and recent memory loss/ dementia onset.  [facility] on Oct 13, 2016, patient provided verbal consent for [facility] services and requested that [facility] CM staff communicate with her daughter [name] for ongoing follow up/ outreach.  [facility] CSW is also involved in patient's care for assistance with possible long-term SNF placement.  I was unable to leave patient's daughter HIPAA compliant voice mail message today, as automated outgoing message states that her voicemail box is full and cannot accept messages.  Plan:  Will re-attempt [facility] Community CM telephone outreach on Monday of next week, after patient's scheduled PCP visit, if I do not hear back from patient's daughter later today.  Caryl PinaLaine Mckinney Chrysa Rampy, RN, BSN, [employer] Community Care Coordinator [facility] Care Management  [phone]

## 2016-10-18 ENCOUNTER — Encounter: Payer: Self-pay | Admitting: *Deleted

## 2016-10-18 ENCOUNTER — Encounter: Payer: Self-pay | Admitting: Family Medicine

## 2016-10-18 ENCOUNTER — Other Ambulatory Visit: Payer: Self-pay | Admitting: *Deleted

## 2016-10-18 ENCOUNTER — Ambulatory Visit (INDEPENDENT_AMBULATORY_CARE_PROVIDER_SITE_OTHER): Payer: PPO | Admitting: Family Medicine

## 2016-10-18 VITALS — BP 114/58 | HR 69 | Temp 97.5°F | Ht 59.25 in | Wt 127.8 lb

## 2016-10-18 DIAGNOSIS — N3941 Urge incontinence: Secondary | ICD-10-CM

## 2016-10-18 DIAGNOSIS — F329 Major depressive disorder, single episode, unspecified: Secondary | ICD-10-CM

## 2016-10-18 DIAGNOSIS — R531 Weakness: Secondary | ICD-10-CM | POA: Diagnosis not present

## 2016-10-18 DIAGNOSIS — R2689 Other abnormalities of gait and mobility: Secondary | ICD-10-CM

## 2016-10-18 DIAGNOSIS — R410 Disorientation, unspecified: Secondary | ICD-10-CM

## 2016-10-18 DIAGNOSIS — F32A Depression, unspecified: Secondary | ICD-10-CM

## 2016-10-18 DIAGNOSIS — F419 Anxiety disorder, unspecified: Secondary | ICD-10-CM | POA: Diagnosis not present

## 2016-10-18 DIAGNOSIS — W19XXXS Unspecified fall, sequela: Secondary | ICD-10-CM

## 2016-10-18 DIAGNOSIS — I10 Essential (primary) hypertension: Secondary | ICD-10-CM

## 2016-10-18 MED ORDER — BUSPIRONE HCL 15 MG PO TABS: 15.0000 mg | ORAL_TABLET | Freq: Two times a day (BID) | ORAL | 11 refills | Status: DC

## 2016-10-18 NOTE — Assessment & Plan Note (Signed)
Not improved with lexapro 20 mg in the setting of declining health and many other stressors  In past -failed sertraline and paroxetine (side eff) Long disc- ref for counseling-pt is now open to it Reviewed stressors/ coping techniques/symptoms/ support sources/ tx options and side effects in detail today She is loosing motivation and exp anhedonia (no SI) Loosing independence as well  Ref for psychologic counseling  Trial of buspar 7.5 bid -to inc to 15 mg bid as tolerated  Discussed expectations of SSRI medication including time to effectiveness and mechanism of action, also poss of side effects (early and late)- including mental fuzziness, weight or appetite change, nausea and poss of worse dep or anxiety (even suicidal thoughts)  Pt voiced understanding and will stop med and update if this occurs   Daughter with her voiced concern and understanding F/u in approx 2 mo or sooner  Planning a move to assisted living

## 2016-10-18 NOTE — Assessment & Plan Note (Signed)
No falls since last correspondance [facility] Using walker and getting stronger  Planning move to asst living - FL2 done today

## 2016-10-18 NOTE — Patient Outreach (Signed)
[facility] Care Management  10/18/2016  Amber Rollins Oct 20, 1930 [id number]   Phone call to Amber Rollins to request that the [facility] form faxed today be completed by patient's provider to assist patient and her family with placement options.    Amber ReamsChrystal Laurita Peron, LCSW [facility] Care Management [phone]

## 2016-10-18 NOTE — Patient Instructions (Signed)
For depression/anxiety we will call regarding referral to counseling   Start buspar 1/2 pill twice daily (am and pm)  After 1-2 weeks if doing well go up to a whole pill twice daily   If any intolerable side effects or if worse - stop it and alert me   We will proceed with urology referral   Continue PT  I will work on FL2  Follow up in about 2 months

## 2016-10-18 NOTE — Assessment & Plan Note (Signed)
bp in fair control at this time  BP Readings from Last 1 Encounters:  10/18/16 (!) 114/58   No changes needed Disc lifstyle change with low sodium diet and exercise  Labs reviewed

## 2016-10-18 NOTE — Assessment & Plan Note (Addendum)
Worsening with time  CT head reassuring w/o signs of NPH Wearing adult undergarments  Watching for s/s of uti Wishes to proceed with urology ref

## 2016-10-18 NOTE — Assessment & Plan Note (Signed)
Multifactorial -hx of falls/ delirium with uti/generalize deconditioning and poor balance/gait disorder Multiple falls  No longer safe at home w/o 24 hour supervision from family  Can mt some independence Interested in assisted living  FL2 done today Medications reviewed today

## 2016-10-18 NOTE — Assessment & Plan Note (Signed)
This is improved and daughter no longer thinks neurology ref appropriate Depressed affect but no dementia red flags now that her uti is resolved  CT of head reviewed

## 2016-10-18 NOTE — Assessment & Plan Note (Signed)
Working with PT  Uses walker Multiple falls Unsure of safety at home- working on move to asst living

## 2016-10-18 NOTE — Patient Outreach (Addendum)
[facility] Customer service manager [facility]) Care Management [facility] Community CM Telephone Outreach, Transition of Care day 1  10/18/2016  Amber Rollins [facility] 01/13/1931 [id number]  Successful telephone outreach to Amber Rollins, daughter/ caregiver for Amber Rollins, 83 y/o female referred to [facility] Community CM from [facility] Telephonic CM for transition of care after recent hospitalization April 9-12, 2018 for acute encephalopathy, generalized weakness, dehydration and UTI.  Patient was discharged from hospital to SNF for rehabilitation and was subsequently discharged home from [facility] on Oct 09, 2016.  Patient has history including, but not limited to, HTN, HLD, osteoarthritis, frequent falls, depression, and recent memory loss/ questionable dementia onset.  During [facility] telephonic CM outreach on Oct 13, 2016, patient provided verbal consent for [facility] CM services and requested that [facility] CM staff communicate with her daughter Amber Rollins for ongoing follow up/ outreach.  [facility] CSW is also involved in patient's care for assistance with possible long-term SNF placement.  Discussed [facility] Community CM services with Amber Rollins, and shared that on 10/13/16 patient was agreeable to ongoing [facility] CM follow up post-SNF discharge; I explained the difference between [facility] CM and home health services, and the Carpenter verbalized understanding/ agreement and provided verbal consent.    Today, Amber Rollins reports:  Medications: -- patient independently manages medications, has all medications and takes as prescribed -- no swallowing issues -- declines medication reconciliation today; prefers to do in person during [facility] CCM initial home visit  Home health [facility]) services: -- no home health orders after SNF discharge -- family has hired Dentist through [facility] home care agency; stays with patient every week day from 10 am- 2 pm -- family currently exploring SNF placement options; [facility] CSW actively involved in  facilitating patient's placement options  Provider appointments: -- Attended PCP office visit today, daughter provided transportation -- Upcoming provider appointments reviewed with Amber Rollins today  Safety/ Mobility/ Falls: -- multiple recent falls, per report of caregiver, "no serious injury yet." -- "balance" issues cause patient to fall- general fall risks/ prevention discussed with Amber Rollins today. -- Patient currently using walker for ambulation. -- Patient continues going [facility] for PT post-SNF discharge, 3 x/ week.  [name] picks patient up from her home for PT sessions  Social/ [employer] needs: -- currently denies need for community resources -- actively involved family/ supportive and involved in patient's care needs (patient's daughter Amber Rollins and son Amber Rollins).  Patient's son and daughter are currently alternating nights of staying with patient overnight to assist with care needs, hopefully prevent falls. -- per report of Amber Rollins, "the main thing we need is for her to be somewhere that she stays supervised and safe 24-7"  Advanced Directives: -- per report of Amber Rollins, patient has both in place since 2015; declines that patient would want to make changes in either   Self-health management of encephalopathy secondary to UTI/ dehydration: -- daughter reports patient has a tendency to not drink enough fluids, which leads to UTI -- family/ hired caregiver attempt to make sure patient is drinking enough water and stays hydrated properly  I provided Amber Rollins the phone numbers for the [facility] 24-hour nurse line, my direct phone number, and the main number to [facility] CM.  Amber Rollins denies further questions, concerns, needs, or problems today, and we scheduled [facility] RN CCM initial home visit for next week.  Plan:  Patient will take medications as prescribed and attend all scheduled provider appointments post-SNF discharge  Patient will continue participating in OP PT sessions/  using walker for ambulation.  Patient and/or her caregivers will contact providers promptly for any new concerns, issues, or problems  Patient/ and her caregivers will actively communicate/ participate with [facility] regarding level of care needs/ possible SNF placement  I will make patient's PCP aware of [facility] Community CM involvement in patient's care post- SNF discharge  [facility] Community CM outreach for ongoing transition of care to continue with scheduled home visit next week.  Caryl PinaLaine Mckinney Rudean Icenhour, RN, BSN, [employer] Community Care Coordinator [facility] Care Management  [phone]

## 2016-10-18 NOTE — Progress Notes (Signed)
Subjective:    Patient ID: Amber Rollins, female    DOB: April 08, 1931, 83 y.o.   MRN: [id number]  HPI Here for f/u of multiple acute and chronic health issues    Last seen by me for dep/anx and urinary c/o (dehydration) and foot c/o inst to get better water intake and ref to podiatry and inc lexapro to 20 mg    She then saw Dr Ermalene Searing in my absence for worsening disorientation and fall  ua and labs were done  Results for orders placed or performed in visit on 10/12/16  CBC with Differential/Platelet  Result Value Ref Range   WBC 8.3 4.0 - 10.5 K/uL   RBC 4.55 3.87 - 5.11 Mil/uL   Hemoglobin 13.8 12.0 - 15.0 g/dL   HCT 86.5 78.4 - 69.6 %   MCV 91.3 78.0 - 100.0 fl   MCHC 33.1 30.0 - 36.0 g/dL   RDW 29.5 28.4 - 13.2 %   Platelets 332.0 150.0 - 400.0 K/uL   Neutrophils Relative % 49.8 43.0 - 77.0 %   Lymphocytes Relative 36.9 12.0 - 46.0 %   Monocytes Relative 8.9 3.0 - 12.0 %   Eosinophils Relative 3.7 0.0 - 5.0 %   Basophils Relative 0.7 0.0 - 3.0 %   Neutro Abs 4.1 1.4 - 7.7 K/uL   Lymphs Abs 3.1 0.7 - 4.0 K/uL   Monocytes Absolute 0.7 0.1 - 1.0 K/uL   Eosinophils Absolute 0.3 0.0 - 0.7 K/uL   Basophils Absolute 0.1 0.0 - 0.1 K/uL  Comprehensive metabolic panel  Result Value Ref Range   Sodium 141 135 - 145 mEq/L   Potassium 3.7 3.5 - 5.1 mEq/L   Chloride 102 96 - 112 mEq/L   CO2 33 (H) 19 - 32 mEq/L   Glucose, Bld 104 (H) 70 - 99 mg/dL   BUN 28 (H) 6 - 23 mg/dL   Creatinine, Ser 4.40 0.40 - 1.20 mg/dL   Total Bilirubin 0.4 0.2 - 1.2 mg/dL   Alkaline Phosphatase 57 39 - 117 U/L   AST 16 0 - 37 U/L   ALT 11 0 - 35 U/L   Total Protein 6.8 6.0 - 8.3 g/dL   Albumin 4.0 3.5 - 5.2 g/dL   Calcium 9.9 8.4 - 10.2 mg/dL   GFR 72.53 (L) >66.44 mL/min  POCT Urinalysis Dipstick (Automated)  Result Value Ref Range   Color, UA yellow    Clarity, UA clear    Glucose, UA negative    Bilirubin, UA negative    Ketones, UA negative    Spec Grav, UA >=1.030 (A) 1.010 -  1.025   Blood, UA small (1+)    pH, UA 6.0 5.0 - 8.0   Protein, UA negative    Urobilinogen, UA 0.2 0.2 or 1.0 E.U./dL   Nitrite, UA negative    Leukocytes, UA Negative Negative  POCT UA - Microscopic Only  Result Value Ref Range   WBC, Ur, HPF, POC none    RBC, urine, microscopic none    Bacteria, U Microscopic none    Mucus, UA     Epithelial cells, urine per micros rare    Crystals, Ur, HPF, POC     Casts, Ur, LPF, POC     Yeast, UA       THN services consulted [facility] community CM) Considering placement- FL2 needed   She was admited 4/9 to hospital for uti with confusion Noted metabolic encephalopathy D/c to [facility] for rehab Came home on the  12th  Confusion continued  Also incontinence   CT from hosp  CT Head Wo Contrast (Accession [id number]) (Order [id number])  Imaging  Date: 09/06/2016 Department: [facility] Released By/Authorizing: Loren Racer, MD (auto-released)  Exam Information   Status Exam Begun  Exam Ended   Final [99] 09/06/2016 7:02 PM 09/06/2016 7:19 PM  PACS Images   Show images for CT Head Wo Contrast  Study Result   CLINICAL DATA:  Initial evaluation for acute trauma, fall.  EXAM: CT HEAD WITHOUT CONTRAST  TECHNIQUE: Contiguous axial images were obtained from the base of the skull through the vertex without intravenous contrast.  COMPARISON:  Prior CT from 01/01/2016.  FINDINGS: Brain: Generalized age-related cerebral atrophy. Fairly advanced chronic microvascular ischemic disease noted, similar to previous. Remote lacunar infarct noted within the left basal ganglia.  No acute intracranial hemorrhage. No evidence for acute large vessel territory infarct. No mass lesion, midline shift or mass effect. No hydrocephalus. No extra-axial fluid collection.  Vascular: No asymmetric hyperdense vessel. Scattered vascular calcifications noted within the carotid siphons and distal  vertebral arteries.  Skull: Scalp soft tissues demonstrate no acute abnormality. Question small left facial contusion. Calvarium intact.  Sinuses/Orbits: Globes and orbital soft tissues within normal limits. Patient status post lens extraction bilaterally. Paranasal sinuses are clear. No mastoid effusion.  Other: None.  IMPRESSION: 1. No acute intracranial process identified. 2. Question small left facial contusion. Correlation with physical exam recommended. 3. Stable atrophy with advanced chronic microvascular ischemic disease.   Electronically Signed   By: Rise Mu M.D.   On: 09/06/2016 19:32   No signs of NPH  Wt Readings from Last 3 Encounters:  10/18/16 127 lb 12 oz (57.9 kg)  10/12/16 128 lb 4 oz (58.2 kg)  10/07/16 131 lb 12.8 oz (59.8 kg)   bmi 25.5  Reviewed letter from family Phillis Monroe vs her recent decline  Having some home care  8-2 pm  Family is there with her at night- son and daughter Having a hard time to keep herself from falling  She still goes back to [place] for PT (they pick her up)   No c/o pain  Vitals are strong   Depression/anxiety is overall worse  She does not think increasing the lexapro helped (wondered if it made her mouth dry and if it made her more confused)  She "does not have anything to look forward to" Not motivated  Weaned back off of the lexapro   Has urology referral   Would rather not go to neuro   Is looking for elder care to help more  Wants to move to assisted living  Needs FL2 done today  Also paperwork for handicapped placard for her daughter who drives her    Patient Active Problem List   Diagnosis Date Noted  . Altered mental state 10/12/2016  . Foot pain, bilateral 08/23/2016  . Diarrhea 03/29/2016  . Hyponatremia 01/01/2016  . Routine general medical examination at a health care facility 11/10/2015  . Urinary frequency 10/23/2014  . Generalized weakness 10/23/2014  . Hammer toe  09/20/2014  . Herpetic dermatitis 08/09/2014  . Poor balance 04/11/2013  . Thoracic back pain 04/11/2013  . Urinary incontinence 12/27/2012  . Thoracic kyphosis 09/24/2012  . Fall 08/09/2012  . Hemorrhoid 12/24/2011  . Dysuria 12/24/2011  . Low back pain 12/24/2011  . Hypokalemia 10/06/2010  . HEMATURIA UNSPECIFIED 10/02/2009  . NIGHT SWEATS 10/02/2009  . GERD 06/18/2009  . INCONTINENCE,  URGE 06/18/2009  . FATIGUE 03/12/2009  . HYPOKALEMIA 10/08/2008  . MENOPAUSAL SYNDROME 01/22/2008  . EDEMA 07/19/2007  . Anxiety and depression 06/21/2007  . FOOT PAIN, CHRONIC 03/06/2007  . ALLERGIC RHINITIS, SEASONAL 09/12/2006  . IRRITABLE BOWEL SYNDROME 09/12/2006  . FIBROCYSTIC BREAST DISEASE 09/12/2006  . OSTEOARTHRITIS 09/12/2006  . HEMORRHOIDS, HX OF 09/12/2006  . Essential hypertension 05/31/2000  . Disorder of bone and cartilage 11/29/1999  . Hyperlipidemia 05/31/1992   Past Medical History:  Diagnosis Date  . Chronic foot pain    mortons neuroma  . Complication of anesthesia   . Cystocele 01/01   neg. sx  . fracture fibula ? 2003   left  . GERD (gastroesophageal reflux disease)   . Hemorrhoids   . Hyperlipemia 05/31/1992  . Hypertension 05/31/2000  . Menopausal symptoms    vasomotor symptoms-severe  . Osteoarthritis   . Osteopenia 11/29/1999  . Pedal edema    worsened by Norvasc  . Plantar fasciitis   . PONV (postoperative nausea and vomiting)    Past Surgical History:  Procedure Laterality Date  . Anterior repari w//surg proc  06/30/99   RSO  . BLADDER SUSPENSION    . BLADDER TACK    . CATARACTS    . Foot problems     neuromas  . history of CT     mass right adnexa, U?S by GYN-observation  . KYPHOPLASTY N/A 09/04/2012   Procedure: T7, T8,T12 Kyphoplasty ;  Surgeon: Clydene Fake, MD;  Location: [facility];  Service: Neurosurgery;  Laterality: N/A;  thoracic seven,eight, and twelve   . PARTIAL HYSTERECTOMY  1970   endometriosis   Social History   Substance Use Topics  . Smoking status: Never Smoker  . Smokeless tobacco: Never Used  . Alcohol use No   Family History  Problem Relation Age of Onset  . Diabetes Brother   . Hypertension Other   . Diabetes Mother   . Heart disease Mother        pacemaker  . Hypertension Mother   . COPD Father   . Diabetes Sister   . Mental illness Son        anxiety-mental health problems  . Diabetes Brother   . Diabetes Sister   . Diabetes Sister    Allergies  Allergen Reactions  . Tdap [Diphth-Acell Pertussis-Tetanus] Swelling  . Amlodipine Besylate     REACTION: edema  . Codeine     headache  . Hydrocodone     hallucinations  . Lovastatin     REACTION: leg pain  . Metronidazole Swelling    And body aches  . Penicillins Itching    Unknown reaction; has tolerated cephalosporins Has patient had a PCN reaction causing immediate rash, facial/tongue/throat swelling, SOB or lightheadedness with hypotension:unsure Has patient had a PCN reaction causing severe rash involving mucus membranes or skin necrosis:unsure Has patient had a PCN reaction that required hospitalization:unsure Has patient had a PCN reaction occurring within the last 10 years:No If all of the above answers are "NO", then may proceed with Cephalosporin use.   . Sertraline Hcl     REACTION: caused insomnia  . Simvastatin     REACTION: non tolerant- leg pain  . Latex Rash  . Sulfonamide Derivatives Rash   Current Outpatient Prescriptions on File Prior to Visit  Medication Sig Dispense Refill  . azelastine (ASTELIN) 0.1 % nasal spray PLACE 2 SPRAYS INTO BOTH NOSTRILS 2 TIMES DAILY. 30 mL 11  . Cholecalciferol (VITAMIN D3) 2000  UNITS TABS Take 1 tablet by mouth daily.     . fluticasone (FLONASE) 50 MCG/ACT nasal spray PLACE 1 TO 2 SPRAYS IN EACH NOSTRIL DAILY. 16 g 11  . mometasone (ELOCON) 0.1 % cream Apply to affected area in small amount 3 times weekly as needed 45 g 3  . omeprazole (PRILOSEC) 20 MG capsule TAKE  1 CAPSULE BY MOUTH 2 TIMES DAILY. 180 capsule 0  . traMADol (ULTRAM) 50 MG tablet TAKE 1 TABLET BY MOUTH EVERY 8 HOURS AS NEEDED FOR SEVERE PAIN. 30 tablet 1  . UNABLE TO FIND Take 60 mLs by mouth 2 (two) times daily. Med Name: Med Pass 2.0 (Document % consumed)     No current facility-administered medications on file prior to visit.     Review of Systems Review of Systems  Constitutional: Negative for fever, appetite change, and unexpected weight change. pos for fatigue and lack of motivation, pos for baseline poor appetite  Eyes: Negative for pain and visual disturbance.  Respiratory: Negative for cough and shortness of breath.   Cardiovascular: Negative for cp or palpitations    Gastrointestinal: Negative for nausea, diarrhea and constipation.  Genitourinary: pos for baseline for urgency and frequency. neg for dysuria or hematuria  Skin: Negative for pallor or rash   Neurological: Negative for weakness, light-headedness, numbness and headaches. pos for poor balance and high fall risk Hematological: Negative for adenopathy. Does not bruise/bleed easily.  Psychiatric/Behavioral: pos for dysphoric and anxious mood w/o SI       Objective:   Physical Exam  Constitutional: She appears well-developed and well-nourished. No distress.  Frail appearing elderly female  Uses walker for ambulation   HENT:  Head: Normocephalic and atraumatic.  Mouth/Throat: Oropharynx is clear and moist. No oropharyngeal exudate.  Eyes: Conjunctivae and EOM are normal. Pupils are equal, round, and reactive to light. Right eye exhibits no discharge. Left eye exhibits no discharge. No scleral icterus.  Neck: Normal range of motion. Neck supple. No JVD present. Carotid bruit is not present. No thyromegaly present.  Cardiovascular: Normal rate, regular rhythm, normal heart sounds and intact distal pulses.  Exam reveals no gallop.   Pulmonary/Chest: Effort normal and breath sounds normal. No respiratory distress. She  has no wheezes. She has no rales.  No crackles  Abdominal: Soft. Bowel sounds are normal. She exhibits no distension, no abdominal bruit and no mass. There is no tenderness.  Musculoskeletal: She exhibits no edema.  Moderate kyphosis  No spinal tenderness No cva tenderness  Lymphadenopathy:    She has no cervical adenopathy.  Neurological: She is alert. She has normal reflexes. She displays no atrophy. No cranial nerve deficit. She exhibits normal muscle tone. Coordination normal.  Mild head tremor today  Gait is slow with walker from generalized weakness   req assistance to rise from chair today   Skin: Skin is warm and dry. No rash noted.  Psychiatric: Her mood appears anxious. Her affect is blunt. Her speech is delayed. She is slowed and withdrawn. She is not actively hallucinating. Thought content is not paranoid. She exhibits a depressed mood. She expresses no homicidal and no suicidal ideation.  Cognition is difficult to establish due to depressed affect today She is attentive.          Assessment & Plan:   Problem List Items Addressed This Visit      Cardiovascular and Mediastinum   Essential hypertension    bp in fair control at this time  BP Readings from Last  1 Encounters:  10/18/16 (!) 114/58   No changes needed Disc lifstyle change with low sodium diet and exercise  Labs reviewed         Other   Altered mental state    This is improved and daughter no longer thinks neurology ref appropriate Depressed affect but no dementia red flags now that her uti is resolved  CT of head reviewed       Anxiety and depression    Not improved with lexapro 20 mg in the setting of declining health and many other stressors  In past -failed sertraline and paroxetine (side eff) Long disc- ref for counseling-pt is now open to it Reviewed stressors/ coping techniques/symptoms/ support sources/ tx options and side effects in detail today She is loosing motivation and exp  anhedonia (no SI) Loosing independence as well  Ref for psychologic counseling  Trial of buspar 7.5 bid -to inc to 15 mg bid as tolerated  Discussed expectations of SSRI medication including time to effectiveness and mechanism of action, also poss of side effects (early and late)- including mental fuzziness, weight or appetite change, nausea and poss of worse dep or anxiety (even suicidal thoughts)  Pt voiced understanding and will stop med and update if this occurs   Daughter with her voiced concern and understanding F/u in approx 2 mo or sooner  Planning a move to assisted living      Relevant Orders   Ambulatory referral to Psychology   Fall    No falls since last correspondance [facility] Using walker and getting stronger  Planning move to asst living - FL2 done today      Generalized weakness    Multifactorial -hx of falls/ delirium with uti/generalize deconditioning and poor balance/gait disorder Multiple falls  No longer safe at home w/o 24 hour supervision from family  Can mt some independence Interested in assisted living  FL2 done today Medications reviewed today      INCONTINENCE, URGE    Worsening with time  CT head reassuring w/o signs of NPH Wearing adult undergarments  Watching for s/s of uti Wishes to proceed with urology ref       Poor balance - Primary    Working with PT  Uses walker Multiple falls Unsure of safety at home- working on move to asst living

## 2016-10-18 NOTE — Assessment & Plan Note (Signed)
Labs reviewed  Suspect much of it caused by depression

## 2016-10-20 ENCOUNTER — Telehealth: Payer: Self-pay | Admitting: Family Medicine

## 2016-10-20 DIAGNOSIS — R350 Frequency of micturition: Secondary | ICD-10-CM

## 2016-10-20 DIAGNOSIS — N3941 Urge incontinence: Secondary | ICD-10-CM

## 2016-10-20 DIAGNOSIS — N3943 Post-void dribbling: Secondary | ICD-10-CM

## 2016-10-20 NOTE — Telephone Encounter (Signed)
Spoke with patients daughter Amber Rollins, she asked me to cancel the Neurology Referral that Dr Ermalene SearingBedsole placed when she saw her. I gave her the number to [place] [facility] [place] as they probably have a sooner appt there. Also she asked that you place a Urology Referral and put [facility]. I have cancelled the Neurology referral.

## 2016-10-21 NOTE — Telephone Encounter (Signed)
Referral done.  Thanks.

## 2016-10-26 ENCOUNTER — Encounter: Payer: Self-pay | Admitting: *Deleted

## 2016-10-26 ENCOUNTER — Other Ambulatory Visit: Payer: Self-pay | Admitting: *Deleted

## 2016-10-26 NOTE — Patient Outreach (Signed)
[facility](THN) Care Management [facility] Community CM Telephone Outreach, Transition of Care day 9  10/26/2016  Amber Rollins [birth year] [id number]  Received voice mail message from [facility], daughter/ caregiver for Amber Rollins, stating need to cancel previously scheduled [facility] RN CM initial home visit planned for Friday, October 29, 2016, as patient will be [facility] on Friday October 29, 2016.  Successful call back telephone outreach to [facility], daughter/ caregiver for Amber Rollins, 83 y/o female [facility] CM from [facility] Telephonic CM for transition of care after recent hospitalization April 9-12, 2018 for acute encephalopathy, generalized weakness, dehydration and UTI. Patient was discharged from hospital to SNF for rehabilitation and [facility]. Patient has history including, but not limited to, HTN, HLD, osteoarthritis, frequent falls, depression, and recent memory loss/ questionable dementia onset.  During [facility] telephonic CM outreach on Oct 13, 2016, patient provided verbal consent for [facility] services and requested that [facility] CM staff communicate with her daughter [name] for ongoing follow up/ outreach. [facility] CSW is also involved in patient's care for assistance with possible long-term SNF placement.  Today, [name] reports that patient "is doing pretty good," and confirms that patient will be moving into ALF [facility] on Friday October 29, 2016.  [name] would like to cancel previously scheduled initial home visit on Friday 10/29/16.  Medications: -- patient independently manages medications, has all medications and takes as prescribed -- declines medication reconciliation today; prefers to do at another time  Home health [facility](HH) services: -- no home health orders after SNF discharge -- family has hired private duty Advertising copywritercaregiver/ sitter through [facility] home care agency;  stays with patient every week day from 10 am- 2 pm  Safety/ Mobility/ Falls: -- no new falls reported today -- Patient currently using walker for ambulation. -- [facility] post-[facility]NF discharge, 3 x/ week.  [facility] from her home for PT sessions -- PT included in therapy for residents of [facility] where patient will be moving on 10/29/16  Self-health management of encephalopathy secondary to UTI/ dehydration: -- daughter reports patient has a tendency to not drink enough fluids, which leads to UTI; reports currently "doing better." -- family/ hired caregiver attempt to make sure patient is drinking enough water and stays hydrated properly -- reiterated previous conversation around UTI prevention/ need to stay hydrated  Given patient's upcoming move to ALF, [name] questions ongoing need for [facility] CM support; explained/ discussed with Jamesetta SoPhyllis that [facility] RN CM and CSW could continue involvement for transition of care, if patient/ family desire for ongoing [facility] support.  [name] confirms that staff at ALF will be providing patient's medications, meals, assisting with bathing needs, routine PT.  ALF will provide transportation to provider appointments for patient.  [name] would like to discuss whether or not [facility] CM services will still be needed with patient and with her brother, and for me to contact her next week, late in the week, to follow up.   I confirmed that [name] the phone numbers for the [facility] 24-hour nurse line, my direct phone number, and the main number to [facility] CM should needs arise prior to scheduled telephone outreach next week.  [name] denies further questions, concerns, needs, or problems today, and we scheduled [facility] RN CCM initial home visit for next week.  Plan:  Patient will take medications as prescribed and  attend all scheduled provider appointments post-SNF discharge  Patient will continue participating in OP PT  sessions/ using walker for ambulation.  Patient and/or her caregivers will contact providers promptly for any new concerns, issues, or problems  Patient/ and her caregivers will actively communicate/ participate with [facility] CSW and [facility] RN CM  [facility] Community CM outreach for ongoing transition of care to continue with scheduled telephone call next week.  Caryl Pina, RN, BSN, [employer] [facility] Care Management  [phone]

## 2016-10-28 ENCOUNTER — Encounter: Payer: Self-pay | Admitting: *Deleted

## 2016-10-28 ENCOUNTER — Other Ambulatory Visit: Payer: Self-pay | Admitting: *Deleted

## 2016-10-28 ENCOUNTER — Telehealth: Payer: Self-pay

## 2016-10-28 NOTE — Telephone Encounter (Signed)
Please give a verbal order and I can fill out any paperwork needed tomorrow

## 2016-10-28 NOTE — Telephone Encounter (Signed)
Spoke to nurse at [facility] and they said that they couldn't take verbal order for initial eval., Dr. Milinda Antisower would have to write order on Rx paper and send. The order needs to state:  "PT and OT eval. and treat", they would need the order faxed to them at [phone].   Daughter called and advise of the process and she is aware Dr. Milinda Antisower will do order tomorrow once she returns

## 2016-10-28 NOTE — Patient Outreach (Signed)
[facility]N) Care Management  10/28/2016  Amber Rollins 02-Mar-1931 [id number]   Voicemail message received from patient's daughter, [name] stating that patient will be moving to [facility] on 10/29/16.  Per voicemail message, the facility will need a copy of patient's FL2.   Plan: FL2 forwarded [facility] [name].           Patient to be [facility] to [facility] social [facility] due to patient's transition to long term care.    Adriana ReamsChrystal Ajayla Iglesias, LCSW [facility] Care Management [phone]

## 2016-10-28 NOTE — Telephone Encounter (Signed)
[name] pts daughter, (DPR signed) left v/m; [facility]. [facility] 10[facility]16.Phyllis request [facility] that pt should continue PT. Phyllis request cb.

## 2016-10-29 ENCOUNTER — Ambulatory Visit: Payer: Self-pay | Admitting: *Deleted

## 2016-10-29 NOTE — Telephone Encounter (Signed)
Done and in IN box 

## 2016-10-29 NOTE — Telephone Encounter (Signed)
Order faxed.

## 2016-11-01 ENCOUNTER — Telehealth: Payer: Self-pay | Admitting: *Deleted

## 2016-11-01 NOTE — Telephone Encounter (Signed)
That is ok -please make sure they have made pt aware

## 2016-11-01 NOTE — Telephone Encounter (Signed)
Kessia notified of Dr. Royden Purlower's comments, and she will make sure family/pt is aware

## 2016-11-01 NOTE — Telephone Encounter (Signed)
Kessia with [facility] left a voicemail stating that she was calling to let Dr. Milinda Antisower know that there is a 48 hour delay on start of care for patient. Governor SpeckingKessia did say that physical therapist can go out tomorrow if that is okay with Dr. Milinda Antisower. Governor SpeckingKessia requested a call back to let her know that this is okay.

## 2016-11-04 ENCOUNTER — Encounter: Payer: Self-pay | Admitting: *Deleted

## 2016-11-04 ENCOUNTER — Other Ambulatory Visit: Payer: Self-pay | Admitting: *Deleted

## 2016-11-04 NOTE — Patient Outreach (Signed)
[facility]) Care Management [facility] Community CM Telephone Outreach, Transition of Care day 18  11/04/2016  Amber Rollins May 30, 1931 [id number]   Unsuccessful telephone outreach to Amber ShirePhyllis Rollins, daughter/ caregiver for Amber SinkShirley Anne Rollins, 83 y/o female [facility] Tele[facility] for transition of care after recent hospitalization April 9-12, 2018 for acute encephalopathy, generalized weakness, dehydration and UTI. Patient was discharged from hospital to SNF for rehabilitation and [facility]. Patient has history including, but not limited to, HTN, HLD, osteoarthritis, frequent falls, depression, and recent memory loss/ questionabledementia onset.  Last week, Amber Rollins had confirmed that [facility], and Amber Rollins was unsure if [facility] CM services would be needed.  Call today was to clarify needs moving forward for transition of care now that patient is living in long-term ALF.  HIPAA compliant voice mail message left for patient's caregiver/ daughter Amber Rollins, requesting return call back.  Plan:  Will re-attempt [facility] Community CM telephone outreach later this week if I do not hear back from patient's caregiver first.  Amber PinaLaine Mckinney Makani Seckman, RN, BSN, CCRN Alumnus [facility] Care Coordinator [facility] Care Management  [phone]

## 2016-11-05 ENCOUNTER — Other Ambulatory Visit: Payer: Self-pay | Admitting: *Deleted

## 2016-11-05 ENCOUNTER — Encounter: Payer: Self-pay | Admitting: *Deleted

## 2016-11-05 NOTE — Patient Outreach (Signed)
[facility] Care Management [facility] Community CM Telephone Outreach, Transition of Care day 19 Case Closure 11/05/2016  Amber SinkShirley Anne Amster 1931/05/10 [id number]  Successful telephone outreach to Amber ShirePhyllis Monroe, daughter/ caregiver for Amber Rollins, 83 y/o female [facility] Community CM [facility] Telephonic CM for transition of care after recent hospitalization April 9-12, 2018 for acute encephalopathy, generalized weakness, dehydration and UTI. Patient was discharged from hospital to SNF for rehabilitation and [facility]. Patient has history including, but not limited to, HTN, HLD, osteoarthritis, frequent falls, depression, and recent memory loss/ questionabledementia onset.  Last week, [name] had confirmed that [facility] would be [facility] October [facility], 2016, and [name] was unsure if [facility] services would be needed.  Call today was to clarify needs moving forward for transition of care now that patient is living in long-term ALF.  Today, [name] reports that patient has transitioned to long-term ALF, where she will permanently reside; HumboldtPhyllis reports staff [facility] involved in all aspects of patient's care, and denies further care coordination needs at this time.  Discussed with AlderPhyllis home health [facility](HH) services for PT, which appear to be active for patient now that she is at ALF; [name] reports that she would like [facility]; I confirmed through EMR that [facility] and that [facility] November 01, 2016.  Encouraged Jamesetta SoPhyllis to follow up with [facility] regarding [facility], and she verbalized understanding and agreement.  Phyllis agrees with [facility] CM case closure today, as [facility].  Plan:  Will make  patient's PCP aware of [facility] Community CM case closure.  Caryl PinaLaine Mckinney Tousey, RN, BSN, [employer]N Alumnus Community Care Coordinator [facility] Care Management  [phone]

## 2016-11-09 ENCOUNTER — Telehealth: Payer: Self-pay | Admitting: *Deleted

## 2016-11-09 NOTE — Telephone Encounter (Signed)
Dr. Milinda Antisower [facility] stating "Resident states that she has nausea and she thinks it's from the new medication. Resident states she never vomited"  Dr. Milinda Antisower put a message on the order asking me to call family and inquire about pt's nausea, and do they want to continue the med (Buspar), if it's making her nauseous.   Called daughter [name] and no answer so left voicemail requesting daughter to call the office back

## 2016-11-10 DIAGNOSIS — B1710 Acute hepatitis C without hepatic coma: Secondary | ICD-10-CM | POA: Diagnosis not present

## 2016-11-10 DIAGNOSIS — D649 Anemia, unspecified: Secondary | ICD-10-CM | POA: Diagnosis not present

## 2016-11-10 DIAGNOSIS — E039 Hypothyroidism, unspecified: Secondary | ICD-10-CM | POA: Diagnosis not present

## 2016-11-10 DIAGNOSIS — R319 Hematuria, unspecified: Secondary | ICD-10-CM | POA: Diagnosis not present

## 2016-11-10 NOTE — Telephone Encounter (Signed)
Spoke with daughter and they did increase her buspar like 10 days ago so they think it was just her body getting use to it. I called nursing home and they said she hasn't recently complained of nausea so they think she is okay for now but they will keep an eye on her. They are going to do a UA/ culture on her today for increase confusion so the nausea could also be related to a possible UTI, but they will send us the results once they get them. Daughter notified and she said she wants to continue med and if pt does have UTI then if we treat it, it may help with nausea if she still has it even though pt hasn't complained of nausea in a few days

## 2016-11-10 NOTE — Telephone Encounter (Signed)
Sounds good-will wait for results  Continue the buspar Thanks for the update

## 2016-11-11 ENCOUNTER — Telehealth: Payer: Self-pay

## 2016-11-11 NOTE — Telephone Encounter (Signed)
Lois with [facility] left v/m; pts daughter had previously declined Home health but pts daughter has changed her mind and request HH to see pt; not this week but next week. Lois [facility]; [facility] will need [facility] of office note within last 90 days. When cb with order Tropic LionsLois will have fax # to send office note.

## 2016-11-11 NOTE — Telephone Encounter (Signed)
Please place a verbal order if you can - let me know if there is paperwork or if I have to order through epic  They will need to let me know what home health they need- PT/OT or skilled nursing? thanks They will need an appt as well in the period specified

## 2016-11-12 NOTE — Telephone Encounter (Signed)
Spoke to [place]issa, Charity fundraiserN and she took the verbal order, it's for the initial eval, they will call back to get verbal orders for specific visits and frequency once they do the initial eval. Office note faxed to fax # given so they can have it on file

## 2016-11-16 ENCOUNTER — Telehealth: Payer: Self-pay

## 2016-11-16 NOTE — Telephone Encounter (Signed)
Tried to return call to [facility] with no answer and no VM option.

## 2016-11-16 NOTE — Telephone Encounter (Signed)
Please give verbal ok for that

## 2016-11-16 NOTE — Telephone Encounter (Signed)
Called to let you know that they're experiencing a delay for PT, she needs an ok order so they can go evaluate pt for PT tomorrow.

## 2016-11-17 DIAGNOSIS — F419 Anxiety disorder, unspecified: Secondary | ICD-10-CM | POA: Diagnosis not present

## 2016-11-17 DIAGNOSIS — M40204 Unspecified kyphosis, thoracic region: Secondary | ICD-10-CM | POA: Diagnosis not present

## 2016-11-17 DIAGNOSIS — G5763 Lesion of plantar nerve, bilateral lower limbs: Secondary | ICD-10-CM | POA: Diagnosis not present

## 2016-11-17 DIAGNOSIS — K589 Irritable bowel syndrome without diarrhea: Secondary | ICD-10-CM | POA: Diagnosis not present

## 2016-11-17 DIAGNOSIS — M1991 Primary osteoarthritis, unspecified site: Secondary | ICD-10-CM | POA: Diagnosis not present

## 2016-11-17 DIAGNOSIS — N6019 Diffuse cystic mastopathy of unspecified breast: Secondary | ICD-10-CM | POA: Diagnosis not present

## 2016-11-17 DIAGNOSIS — I10 Essential (primary) hypertension: Secondary | ICD-10-CM | POA: Diagnosis not present

## 2016-11-17 DIAGNOSIS — F329 Major depressive disorder, single episode, unspecified: Secondary | ICD-10-CM | POA: Diagnosis not present

## 2016-11-17 NOTE — Telephone Encounter (Signed)
Verbal order given to [facility]

## 2016-11-23 DIAGNOSIS — F329 Major depressive disorder, single episode, unspecified: Secondary | ICD-10-CM | POA: Diagnosis not present

## 2016-11-23 DIAGNOSIS — M40204 Unspecified kyphosis, thoracic region: Secondary | ICD-10-CM | POA: Diagnosis not present

## 2016-11-23 DIAGNOSIS — K589 Irritable bowel syndrome without diarrhea: Secondary | ICD-10-CM | POA: Diagnosis not present

## 2016-11-23 DIAGNOSIS — F419 Anxiety disorder, unspecified: Secondary | ICD-10-CM | POA: Diagnosis not present

## 2016-11-23 DIAGNOSIS — I10 Essential (primary) hypertension: Secondary | ICD-10-CM | POA: Diagnosis not present

## 2016-11-23 DIAGNOSIS — M1991 Primary osteoarthritis, unspecified site: Secondary | ICD-10-CM | POA: Diagnosis not present

## 2016-11-23 DIAGNOSIS — Z9181 History of falling: Secondary | ICD-10-CM | POA: Diagnosis not present

## 2016-11-23 DIAGNOSIS — G5763 Lesion of plantar nerve, bilateral lower limbs: Secondary | ICD-10-CM | POA: Diagnosis not present

## 2016-11-29 ENCOUNTER — Ambulatory Visit (INDEPENDENT_AMBULATORY_CARE_PROVIDER_SITE_OTHER): Payer: PPO | Admitting: Licensed Clinical Social Worker

## 2016-11-29 DIAGNOSIS — F4323 Adjustment disorder with mixed anxiety and depressed mood: Secondary | ICD-10-CM | POA: Diagnosis not present

## 2016-11-30 DIAGNOSIS — F419 Anxiety disorder, unspecified: Secondary | ICD-10-CM | POA: Diagnosis not present

## 2016-11-30 DIAGNOSIS — M40204 Unspecified kyphosis, thoracic region: Secondary | ICD-10-CM | POA: Diagnosis not present

## 2016-11-30 DIAGNOSIS — M1991 Primary osteoarthritis, unspecified site: Secondary | ICD-10-CM | POA: Diagnosis not present

## 2016-11-30 DIAGNOSIS — I10 Essential (primary) hypertension: Secondary | ICD-10-CM | POA: Diagnosis not present

## 2016-11-30 DIAGNOSIS — N6019 Diffuse cystic mastopathy of unspecified breast: Secondary | ICD-10-CM | POA: Diagnosis not present

## 2016-11-30 DIAGNOSIS — Z9181 History of falling: Secondary | ICD-10-CM | POA: Diagnosis not present

## 2016-11-30 DIAGNOSIS — G5763 Lesion of plantar nerve, bilateral lower limbs: Secondary | ICD-10-CM | POA: Diagnosis not present

## 2016-11-30 DIAGNOSIS — K589 Irritable bowel syndrome without diarrhea: Secondary | ICD-10-CM | POA: Diagnosis not present

## 2016-11-30 DIAGNOSIS — F329 Major depressive disorder, single episode, unspecified: Secondary | ICD-10-CM | POA: Diagnosis not present

## 2016-12-02 DIAGNOSIS — N8111 Cystocele, midline: Secondary | ICD-10-CM | POA: Diagnosis not present

## 2016-12-02 DIAGNOSIS — N3946 Mixed incontinence: Secondary | ICD-10-CM | POA: Diagnosis not present

## 2016-12-02 DIAGNOSIS — R3129 Other microscopic hematuria: Secondary | ICD-10-CM | POA: Diagnosis not present

## 2016-12-06 DIAGNOSIS — M40204 Unspecified kyphosis, thoracic region: Secondary | ICD-10-CM | POA: Diagnosis not present

## 2016-12-06 DIAGNOSIS — Z9181 History of falling: Secondary | ICD-10-CM | POA: Diagnosis not present

## 2016-12-06 DIAGNOSIS — N6019 Diffuse cystic mastopathy of unspecified breast: Secondary | ICD-10-CM | POA: Diagnosis not present

## 2016-12-06 DIAGNOSIS — K589 Irritable bowel syndrome without diarrhea: Secondary | ICD-10-CM | POA: Diagnosis not present

## 2016-12-06 DIAGNOSIS — M1991 Primary osteoarthritis, unspecified site: Secondary | ICD-10-CM | POA: Diagnosis not present

## 2016-12-06 DIAGNOSIS — F329 Major depressive disorder, single episode, unspecified: Secondary | ICD-10-CM | POA: Diagnosis not present

## 2016-12-06 DIAGNOSIS — F419 Anxiety disorder, unspecified: Secondary | ICD-10-CM | POA: Diagnosis not present

## 2016-12-06 DIAGNOSIS — I10 Essential (primary) hypertension: Secondary | ICD-10-CM | POA: Diagnosis not present

## 2016-12-06 DIAGNOSIS — G5763 Lesion of plantar nerve, bilateral lower limbs: Secondary | ICD-10-CM | POA: Diagnosis not present

## 2016-12-07 ENCOUNTER — Ambulatory Visit: Payer: PPO | Admitting: Licensed Clinical Social Worker

## 2016-12-08 ENCOUNTER — Observation Stay (HOSPITAL_BASED_OUTPATIENT_CLINIC_OR_DEPARTMENT_OTHER)
Admission: EM | Admit: 2016-12-08 | Discharge: 2016-12-10 | Disposition: A | Discharge status: Nursing Home (Includes ICF) | Payer: PPO | Source: Home / Self Care | Attending: Emergency Medicine | Admitting: Emergency Medicine

## 2016-12-08 ENCOUNTER — Emergency Department (HOSPITAL_COMMUNITY): Payer: PPO

## 2016-12-08 ENCOUNTER — Encounter (HOSPITAL_COMMUNITY): Payer: Self-pay | Admitting: Emergency Medicine

## 2016-12-08 DIAGNOSIS — Z881 Allergy status to other antibiotic agents status: Secondary | ICD-10-CM | POA: Diagnosis not present

## 2016-12-08 DIAGNOSIS — I10 Essential (primary) hypertension: Secondary | ICD-10-CM

## 2016-12-08 DIAGNOSIS — K219 Gastro-esophageal reflux disease without esophagitis: Secondary | ICD-10-CM | POA: Diagnosis present

## 2016-12-08 DIAGNOSIS — H269 Unspecified cataract: Secondary | ICD-10-CM | POA: Diagnosis present

## 2016-12-08 DIAGNOSIS — Z833 Family history of diabetes mellitus: Secondary | ICD-10-CM

## 2016-12-08 DIAGNOSIS — G9340 Encephalopathy, unspecified: Secondary | ICD-10-CM

## 2016-12-08 DIAGNOSIS — Z79899 Other long term (current) drug therapy: Secondary | ICD-10-CM

## 2016-12-08 DIAGNOSIS — R404 Transient alteration of awareness: Secondary | ICD-10-CM | POA: Diagnosis not present

## 2016-12-08 DIAGNOSIS — I6789 Other cerebrovascular disease: Secondary | ICD-10-CM | POA: Diagnosis not present

## 2016-12-08 DIAGNOSIS — J302 Other seasonal allergic rhinitis: Secondary | ICD-10-CM | POA: Insufficient documentation

## 2016-12-08 DIAGNOSIS — R9082 White matter disease, unspecified: Secondary | ICD-10-CM | POA: Diagnosis present

## 2016-12-08 DIAGNOSIS — Z7982 Long term (current) use of aspirin: Secondary | ICD-10-CM | POA: Insufficient documentation

## 2016-12-08 DIAGNOSIS — R55 Syncope and collapse: Secondary | ICD-10-CM | POA: Diagnosis present

## 2016-12-08 DIAGNOSIS — R569 Unspecified convulsions: Secondary | ICD-10-CM | POA: Diagnosis not present

## 2016-12-08 DIAGNOSIS — R9431 Abnormal electrocardiogram [ECG] [EKG]: Secondary | ICD-10-CM | POA: Diagnosis not present

## 2016-12-08 DIAGNOSIS — R627 Adult failure to thrive: Secondary | ICD-10-CM

## 2016-12-08 DIAGNOSIS — Z818 Family history of other mental and behavioral disorders: Secondary | ICD-10-CM

## 2016-12-08 DIAGNOSIS — M8580 Other specified disorders of bone density and structure, unspecified site: Secondary | ICD-10-CM | POA: Diagnosis present

## 2016-12-08 DIAGNOSIS — Z66 Do not resuscitate: Secondary | ICD-10-CM | POA: Diagnosis present

## 2016-12-08 DIAGNOSIS — Z8249 Family history of ischemic heart disease and other diseases of the circulatory system: Secondary | ICD-10-CM | POA: Diagnosis not present

## 2016-12-08 DIAGNOSIS — F0390 Unspecified dementia without behavioral disturbance: Secondary | ICD-10-CM | POA: Diagnosis present

## 2016-12-08 DIAGNOSIS — M2062 Acquired deformities of toe(s), unspecified, left foot: Secondary | ICD-10-CM

## 2016-12-08 DIAGNOSIS — Z9181 History of falling: Secondary | ICD-10-CM

## 2016-12-08 DIAGNOSIS — R410 Disorientation, unspecified: Secondary | ICD-10-CM | POA: Insufficient documentation

## 2016-12-08 DIAGNOSIS — Z90711 Acquired absence of uterus with remaining cervical stump: Secondary | ICD-10-CM

## 2016-12-08 DIAGNOSIS — R4182 Altered mental status, unspecified: Secondary | ICD-10-CM | POA: Diagnosis not present

## 2016-12-08 DIAGNOSIS — Z887 Allergy status to serum and vaccine status: Secondary | ICD-10-CM

## 2016-12-08 DIAGNOSIS — R9401 Abnormal electroencephalogram [EEG]: Secondary | ICD-10-CM

## 2016-12-08 DIAGNOSIS — R29818 Other symptoms and signs involving the nervous system: Secondary | ICD-10-CM | POA: Diagnosis not present

## 2016-12-08 DIAGNOSIS — Z8673 Personal history of transient ischemic attack (TIA), and cerebral infarction without residual deficits: Secondary | ICD-10-CM

## 2016-12-08 DIAGNOSIS — R4781 Slurred speech: Secondary | ICD-10-CM | POA: Diagnosis not present

## 2016-12-08 DIAGNOSIS — F329 Major depressive disorder, single episode, unspecified: Secondary | ICD-10-CM

## 2016-12-08 DIAGNOSIS — Z7951 Long term (current) use of inhaled steroids: Secondary | ICD-10-CM | POA: Insufficient documentation

## 2016-12-08 DIAGNOSIS — E785 Hyperlipidemia, unspecified: Secondary | ICD-10-CM | POA: Diagnosis present

## 2016-12-08 DIAGNOSIS — R42 Dizziness and giddiness: Secondary | ICD-10-CM | POA: Diagnosis not present

## 2016-12-08 DIAGNOSIS — Z880 Allergy status to penicillin: Secondary | ICD-10-CM

## 2016-12-08 DIAGNOSIS — Z91048 Other nonmedicinal substance allergy status: Secondary | ICD-10-CM

## 2016-12-08 DIAGNOSIS — Z888 Allergy status to other drugs, medicaments and biological substances status: Secondary | ICD-10-CM

## 2016-12-08 DIAGNOSIS — Z825 Family history of asthma and other chronic lower respiratory diseases: Secondary | ICD-10-CM

## 2016-12-08 DIAGNOSIS — Z882 Allergy status to sulfonamides status: Secondary | ICD-10-CM

## 2016-12-08 DIAGNOSIS — G40209 Localization-related (focal) (partial) symptomatic epilepsy and epileptic syndromes with complex partial seizures, not intractable, without status epilepticus: Principal | ICD-10-CM | POA: Diagnosis present

## 2016-12-08 DIAGNOSIS — Z885 Allergy status to narcotic agent status: Secondary | ICD-10-CM | POA: Diagnosis not present

## 2016-12-08 DIAGNOSIS — Z91040 Latex allergy status: Secondary | ICD-10-CM | POA: Diagnosis not present

## 2016-12-08 DIAGNOSIS — F419 Anxiety disorder, unspecified: Secondary | ICD-10-CM

## 2016-12-08 DIAGNOSIS — I639 Cerebral infarction, unspecified: Secondary | ICD-10-CM | POA: Diagnosis not present

## 2016-12-08 DIAGNOSIS — E876 Hypokalemia: Secondary | ICD-10-CM

## 2016-12-08 DIAGNOSIS — F0150 Vascular dementia without behavioral disturbance: Secondary | ICD-10-CM | POA: Diagnosis not present

## 2016-12-08 LAB — CBG MONITORING, ED: Glucose-Capillary: 118 mg/dL — ABNORMAL HIGH (ref 65–99)

## 2016-12-08 LAB — URINALYSIS, ROUTINE W REFLEX MICROSCOPIC
BILIRUBIN URINE: NEGATIVE
GLUCOSE, UA: NEGATIVE mg/dL
HGB URINE DIPSTICK: NEGATIVE
KETONES UR: NEGATIVE mg/dL
Leukocytes, UA: NEGATIVE
Nitrite: NEGATIVE
PROTEIN: NEGATIVE mg/dL
Specific Gravity, Urine: 1.006 (ref 1.005–1.030)
pH: 8 (ref 5.0–8.0)

## 2016-12-08 LAB — BASIC METABOLIC PANEL
Anion gap: 8 (ref 5–15)
BUN: 19 mg/dL (ref 6–20)
CALCIUM: 9.6 mg/dL (ref 8.9–10.3)
CHLORIDE: 105 mmol/L (ref 101–111)
CO2: 30 mmol/L (ref 22–32)
Creatinine, Ser: 1.03 mg/dL — ABNORMAL HIGH (ref 0.44–1.00)
GFR calc Af Amer: 57 mL/min — ABNORMAL LOW (ref >60)
GFR calc non Af Amer: 49 mL/min — ABNORMAL LOW (ref >60)
GLUCOSE: 150 mg/dL — AB (ref 65–99)
Potassium: 3.5 mmol/L (ref 3.5–5.1)
Sodium: 143 mmol/L (ref 135–145)

## 2016-12-08 LAB — CBC
HCT: 40.2 % (ref 36.0–46.0)
Hemoglobin: 13.2 g/dL (ref 12.0–15.0)
MCH: 30.1 pg (ref 26.0–34.0)
MCHC: 32.8 g/dL (ref 30.0–36.0)
MCV: 91.6 fL (ref 78.0–100.0)
PLATELETS: 270 10*3/uL (ref 150–400)
RBC: 4.39 MIL/uL (ref 3.87–5.11)
RDW: 14.6 % (ref 11.5–15.5)
WBC: 8.9 10*3/uL (ref 4.0–10.5)

## 2016-12-08 LAB — I-STAT TROPONIN, ED: Troponin i, poc: 0.01 ng/mL (ref 0.00–0.08)

## 2016-12-08 LAB — MRSA PCR SCREENING: MRSA BY PCR: NEGATIVE

## 2016-12-08 MED ORDER — SODIUM CHLORIDE 0.9 % IV BOLUS (SEPSIS)
250.0000 mL | Freq: Once | INTRAVENOUS | Status: AC
  Administered 2016-12-08: 250 mL via INTRAVENOUS

## 2016-12-08 MED ORDER — PANTOPRAZOLE SODIUM 40 MG PO TBEC
40.0000 mg | DELAYED_RELEASE_TABLET | Freq: Every day | ORAL | Status: DC
  Administered 2016-12-09 – 2016-12-10 (×2): 40 mg via ORAL
  Filled 2016-12-08 (×2): qty 1

## 2016-12-08 MED ORDER — MOMETASONE FUROATE 0.1 % EX CREA: 1.0000 "application " | TOPICAL_CREAM | Freq: Every day | CUTANEOUS | Status: DC | PRN

## 2016-12-08 MED ORDER — SODIUM CHLORIDE 0.9% FLUSH
3.0000 mL | Freq: Two times a day (BID) | INTRAVENOUS | Status: DC
  Administered 2016-12-09 – 2016-12-10 (×2): 3 mL via INTRAVENOUS

## 2016-12-08 MED ORDER — SODIUM CHLORIDE 0.9 % IV SOLN
INTRAVENOUS | Status: DC
  Administered 2016-12-08: 18:00:00 via INTRAVENOUS

## 2016-12-08 MED ORDER — BUSPIRONE HCL 5 MG PO TABS
15.0000 mg | ORAL_TABLET | Freq: Two times a day (BID) | ORAL | Status: DC
  Administered 2016-12-08 – 2016-12-10 (×3): 15 mg via ORAL
  Filled 2016-12-08 (×4): qty 1

## 2016-12-08 MED ORDER — TRAMADOL HCL 50 MG PO TABS: 50.0000 mg | ORAL_TABLET | Freq: Four times a day (QID) | ORAL | Status: DC | PRN

## 2016-12-08 MED ORDER — HYDRALAZINE HCL 20 MG/ML IJ SOLN
5.0000 mg | INTRAMUSCULAR | Status: DC | PRN
  Administered 2016-12-08 – 2016-12-09 (×2): 5 mg via INTRAVENOUS
  Filled 2016-12-08 (×4): qty 0.25

## 2016-12-08 MED ORDER — VITAMIN D3 25 MCG (1000 UNIT) PO TABS
2000.0000 [IU] | ORAL_TABLET | Freq: Every day | ORAL | Status: DC
  Administered 2016-12-09 – 2016-12-10 (×2): 2000 [IU] via ORAL
  Filled 2016-12-08 (×2): qty 2

## 2016-12-08 MED ORDER — FLUTICASONE PROPIONATE 50 MCG/ACT NA SUSP
1.0000 | Freq: Every day | NASAL | Status: DC
  Administered 2016-12-09 – 2016-12-10 (×2): 1 via NASAL
  Filled 2016-12-08: qty 16

## 2016-12-08 MED ORDER — AZELASTINE HCL 0.1 % NA SOLN
1.0000 | Freq: Two times a day (BID) | NASAL | Status: DC
  Administered 2016-12-08 – 2016-12-10 (×4): 1 via NASAL
  Filled 2016-12-08: qty 30

## 2016-12-08 NOTE — ED Notes (Signed)
Pt left to go upstairs with transporter and family.

## 2016-12-08 NOTE — ED Triage Notes (Signed)
[facility] EMS [facility] "[facility]"; no fall or pain reported post; pt initially disoriented but a/o x4 at present time.

## 2016-12-08 NOTE — ED Provider Notes (Signed)
[facility]EM[facility] Provider Note   CSN: [id number] Arrival date & time: 12/08/16  1111     History   Chief Complaint Chief Complaint  Patient presents with  . Loss of Consciousness    HPI Amber Rollins is a 83 y.o. female.  The history is provided by the EMS personnel and the patient. No language interpreter was used.  Loss of Consciousness      Amber Rollins is a 83 y.o. female who presents to the Emergency Department complaining of syncope.  Level V caveat due to confusion. Per EMS report patient was sitting with family this morning when she became unresponsive and had a syncopal event. When she awoke she was disoriented and did not recall the event. No noted seizure activity. Patient denies any complaints the emergency department and does not recall events earlier in the day. She denies any headache, chest pain, shortness breath, no pain, black or bloody stools, numbness, weakness.  Past Medical History:  Diagnosis Date  . Chronic foot pain    mortons neuroma  . Complication of anesthesia   . Cystocele 01/01   neg. sx  . fracture fibula ? 2003   left  . GERD (gastroesophageal reflux disease)   . Hemorrhoids   . Hyperlipemia 05/31/1992  . Hypertension 05/31/2000  . Menopausal symptoms    vasomotor symptoms-severe  . Osteoarthritis   . Osteopenia 11/29/1999  . Pedal edema    worsened by Norvasc  . Plantar fasciitis   . PONV (postoperative nausea and vomiting)     Patient Active Problem List   Diagnosis Date Noted  . Syncope 12/08/2016  . Altered mental state 10/12/2016  . Foot pain, bilateral 08/23/2016  . Diarrhea 03/29/2016  . Hyponatremia 01/01/2016  . Routine general medical examination at a health care facility 11/10/2015  . Urinary frequency 10/23/2014  . Generalized weakness 10/23/2014  . Hammer toe 09/20/2014  . Herpetic dermatitis 08/09/2014  . Poor balance 04/11/2013  . Thoracic back pain 04/11/2013  . Urinary incontinence  12/27/2012  . Thoracic kyphosis 09/24/2012  . Fall 08/09/2012  . Hemorrhoid 12/24/2011  . Dysuria 12/24/2011  . Low back pain 12/24/2011  . Hypokalemia 10/06/2010  . HEMATURIA UNSPECIFIED 10/02/2009  . NIGHT SWEATS 10/02/2009  . GERD 06/18/2009  . INCONTINENCE, URGE 06/18/2009  . FATIGUE 03/12/2009  . HYPOKALEMIA 10/08/2008  . MENOPAUSAL SYNDROME 01/22/2008  . EDEMA 07/19/2007  . Anxiety and depression 06/21/2007  . FOOT PAIN, CHRONIC 03/06/2007  . ALLERGIC RHINITIS, SEASONAL 09/12/2006  . IRRITABLE BOWEL SYNDROME 09/12/2006  . FIBROCYSTIC BREAST DISEASE 09/12/2006  . OSTEOARTHRITIS 09/12/2006  . HEMORRHOIDS, HX OF 09/12/2006  . Essential hypertension 05/31/2000  . Disorder of bone and cartilage 11/29/1999  . Hyperlipidemia 05/31/1992    Past Surgical History:  Procedure Laterality Date  . Anterior repari w//surg proc  06/30/99   RSO  . BLADDER SUSPENSION    . BLADDER TACK    . CATARACTS    . Foot problems     neuromas  . history of CT     mass right adnexa, U?S by GYN-observation  . KYPHOPLASTY N/A 09/04/2012   Procedure: T7, T8,T12 Kyphoplasty ;  Surgeon: Clydene Fake, MD;  Location: [facility];  Service: Neurosurgery;  Laterality: N/A;  thoracic seven,eight, and twelve   . PARTIAL HYSTERECTOMY  1970   endometriosis    OB History    No data available       Home Medications    Prior to Admission medications  Medication Sig Start Date End Date Taking? Authorizing Provider  acetaminophen (TYLENOL) 325 MG tablet Take 650 mg by mouth every 4 (four) hours as needed.   Yes [provider]  azelastine (ASTELIN) 0.1 % nasal spray PLACE 2 SPRAYS INTO BOTH NOSTRILS 2 TIMES DAILY. 02/27/16  Yes Tower, Audrie Gallus, MD  busPIRone (BUSPAR) 15 MG tablet Take 1 tablet (15 mg total) by mouth 2 (two) times daily. 10/18/16  Yes Tower, Audrie Gallus, MD  Cholecalciferol (VITAMIN D3) 2000 UNITS TABS Take 1 tablet by mouth daily with breakfast.    Yes [provider]    fluticasone (FLONASE) 50 MCG/ACT nasal spray PLACE 1 TO 2 SPRAYS IN EACH NOSTRIL DAILY. 02/27/16  Yes Tower, Marne A, MD  mometasone (ELOCON) 0.1 % cream Apply to affected area in small amount 3 times weekly as needed Patient taking differently: Apply 1 application topically daily as needed (for rash).  11/10/15  Yes Tower, Audrie Gallus, MD  omeprazole (PRILOSEC) 20 MG capsule TAKE 1 CAPSULE BY MOUTH 2 TIMES DAILY. 06/21/16  Yes Tower, Audrie Gallus, MD  traMADol (ULTRAM) 50 MG tablet TAKE 1 TABLET BY MOUTH EVERY 8 HOURS AS NEEDED FOR SEVERE PAIN. 03/30/16  Yes Tower, Audrie Gallus, MD    Family History Family History  Problem Relation Age of Onset  . Diabetes Brother   . Hypertension Other   . Diabetes Mother   . Heart disease Mother        pacemaker  . Hypertension Mother   . COPD Father   . Diabetes Sister   . Mental illness Son        anxiety-mental health problems  . Diabetes Brother   . Diabetes Sister   . Diabetes Sister     Social History Social History  Substance Use Topics  . Smoking status: Never Smoker  . Smokeless tobacco: Never Used  . Alcohol use No     Allergies   Tdap [diphth-acell pertussis-tetanus]; Amlodipine besylate; Codeine; Hydrocodone; Lovastatin; Metronidazole; Penicillins; Sertraline hcl; Simvastatin; Latex; and Sulfonamide derivatives   Review of Systems Review of Systems  Cardiovascular: Positive for syncope.  All other systems reviewed and are negative.    Physical Exam Updated Vital Signs BP (!) 149/70 (BP Location: Right Arm)   Pulse 69   Temp 98.1 F (36.7 C) (Oral)   Resp 20   Ht 5' 3.5" (1.613 m)   Wt 57.6 kg (127 lb)   SpO2 96%   BMI 22.14 kg/m   Physical Exam  Constitutional: She is oriented to person, place, and time. She appears well-developed and well-nourished.  HENT:  Head: Normocephalic and atraumatic.  Eyes: Pupils are equal, round, and reactive to light.  Cardiovascular: Normal rate and regular rhythm.   No murmur  heard. Pulmonary/Chest: Effort normal and breath sounds normal. No respiratory distress.  Abdominal: Soft. There is no tenderness. There is no rebound and no guarding.  Musculoskeletal: She exhibits no edema or tenderness.  Neurological: She is alert and oriented to person, place, and time.  5 out of 5 strength in all 4 extremities with sensation to light touch intact in all 4 Lahoma Rocker is  Skin: Skin is warm and dry.  Psychiatric: She has a normal mood and affect. Her behavior is normal.  Nursing note and vitals reviewed.    ED Treatments / Results  Labs (all labs ordered are listed, but only abnormal results are displayed) Labs Reviewed  BASIC METABOLIC PANEL - Abnormal; Notable for the following:  Result Value   Glucose, Bld 150 (*)    Creatinine, Ser 1.03 (*)    GFR calc non Af Amer 49 (*)    GFR calc Af Amer 57 (*)    All other components within normal limits  URINALYSIS, ROUTINE W REFLEX MICROSCOPIC - Abnormal; Notable for the following:    Color, Urine STRAW (*)    All other components within normal limits  BASIC METABOLIC PANEL - Abnormal; Notable for the following:    Potassium 3.3 (*)    GFR calc non Af Amer 55 (*)    All other components within normal limits  CBG MONITORING, ED - Abnormal; Notable for the following:    Glucose-Capillary 118 (*)    All other components within normal limits  MRSA PCR SCREENING  CBC  CBC  TSH  I-STAT TROPOININ, ED    EKG  EKG Interpretation  Date/Time:  Wednesday December 08 2016 11:40:31 EDT Ventricular Rate:  61 PR Interval:    QRS Duration: 81 QT Interval:  426 QTC Calculation: 430 R Axis:   2 Text Interpretation:  Sinus rhythm Baseline wander in lead(s) V2 Confirmed by Lincoln Brighamees, Liz [phone]([id number]) on 12/08/2016 11:44:46 AM       Radiology Dg Chest 2 View  Result Date: 12/08/2016 CLINICAL DATA:  Syncope EXAM: CHEST  2 VIEW COMPARISON:  September 03, 2012 FINDINGS: There is no edema or consolidation. Heart size and pulmonary  vascularity are normal. No adenopathy. There is aortic atherosclerosis. There is evidence of an old fracture of the inferior sternum with remodeling. There is also a prior fracture of the inferior manubrium with remodeling. Patient has had previous kyphoplasty procedures at T7, T8, T12. There is increase in kyphosis at the T7-T8 level. IMPRESSION: No edema or consolidation. Stable cardiac silhouette. There is aortic atherosclerosis. Evidence of prior fractures. Patient has undergone kyphoplasty procedures in the mid and lower thoracic regions. Aortic Atherosclerosis (ICD10-I70.0). Electronically Signed   By: Bretta BangWilliam  Woodruff III M.D.   On: 12/08/2016 13:27   Ct Head Wo Contrast  Result Date: 12/08/2016 CLINICAL DATA:  Altered mental status with probable seizure EXAM: CT HEAD WITHOUT CONTRAST TECHNIQUE: Contiguous axial images were obtained from the base of the skull through the vertex without intravenous contrast. COMPARISON:  September 06, 2016 FINDINGS: Brain: Mild diffuse atrophy is stable. There is no intracranial mass, hemorrhage, extra-axial fluid collection, or midline shift. There is small vessel disease throughout the centra semiovale bilaterally. There is extensive small vessel disease in the external capsules bilaterally, particularly anteriorly. A lesser degree of small vessel disease is noted in the anterior limb of each internal capsule. There is no evident new gray-white compartment lesion. No acute infarct appreciable. Vascular: No hyperdense vessel. There is calcification in each carotid siphon. Skull: Bony calvarium appears intact. There is a small left frontal exostosis, benign and stable. Sinuses/Orbits: There is mucosal thickening in several ethmoid air cells bilaterally. Other visualized paranasal sinuses are clear. Orbits appear symmetric bilaterally. Other: Mastoid air cells are clear. There is degenerative change in the temporomandibular joints bilaterally. IMPRESSION: Atrophy with extensive  supratentorial small vessel disease, stable. No intracranial mass, hemorrhage, or extra-axial fluid collection. No evident acute infarct. Areas of arterial vascular calcification bilaterally. 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 There is mucosal thickening in several ethmoid air cells. Electronically Signed   By: Bretta BangWilliam  Woodruff III M.D.   On: 12/08/2016 15:37    Procedures Procedures (including critical care time)  Medications Ordered in ED Medications  busPIRone (BUSPAR)  tablet 15 mg (15 mg Oral Given 12/08/16 2127)  pantoprazole (PROTONIX) EC tablet 40 mg (not administered)  traMADol (ULTRAM) tablet 50 mg (not administered)  azelastine (ASTELIN) 0.1 % nasal spray 1 spray (1 spray Each Nare Given 12/08/16 2128)  fluticasone (FLONASE) 50 MCG/ACT nasal spray 1 spray (not administered)  mometasone (ELOCON) 0.1 % cream 1 application (not administered)  cholecalciferol (VITAMIN D) tablet 2,000 Units (not administered)  sodium chloride flush (NS) 0.9 % injection 3 mL (not administered)  0.9 %  sodium chloride infusion ( Intravenous New Bag/Given 12/08/16 1823)  hydrALAZINE (APRESOLINE) injection 5 mg (5 mg Intravenous Given 12/08/16 1852)  sodium chloride 0.9 % bolus 250 mL (0 mLs Intravenous Stopped 12/08/16 1426)     Initial Impression / Assessment and Plan / ED Course  I have reviewed the triage vital signs and the nursing notes.  Pertinent labs & imaging results that were available during my care of the patient were reviewed by me and considered in my medical decision making (see chart for details).     1339 additional hx available from daughter.  Pt was sitting in a chair and they were talking when the patient stared forward and began to blink and then her head slumped forward.  She appeared to be breathing heavy and was unresponsive with her eyes open for about five minutes.  When she awoke she was confused for another 20 minutes and had no recollection of the  episode.   Patient here for evaluation of syncopal event. Question syncope versus seizure based on daughter's description of the event. Patient is currently neurologically intact but amnestic to events. Plan to admit for observation for syncope/seizure evaluation.  Final Clinical Impressions(s) / ED Diagnoses   Final diagnoses:  None    New Prescriptions Current Discharge Medication List       Tilden Fossa, MD 12/09/16 [phone]

## 2016-12-08 NOTE — H&P (Signed)
History and Physical    Amber Rollins ZOX:[id number] DOB: [birth year] DOA: 12/08/2016  Referring MD/NP/PA: Dr. Madilyn Hook  PCP: Milinda Antis Audrie Gallus, MD   Patient coming from: Home  Chief Complaint: syncope  HPI: Amber Rollins is a 83 y.o. female with hypertension and hyperlipidemia, not on any treatment for it, presented to emergency department after an episode of passing out at home. Please note that patient was somewhat confused on admission and daughter at bedside provided more information. Daughter explains that patient was sitting with family and talking, became suddenly unresponsive and passed out for several minutes. When patient woke up she was disoriented and not able to recall any details of the event. Patient denies any symptoms prior to the event, no chest pains or headaches, no shortness of breath or dizziness, no numbness or weakness, no changes in vision. Patient denies similar events in the past.  ED Course:  Patient hemodynamically stable, vital signs notable for initial blood pressure 156/60, repeat blood pressure 181/93, vital signs otherwise stable. Blood work unremarkable except mild lower rise in creatinine 1.03. TRH asked to admit for further evaluation.  Review of Systems:  Constitutional: Negative for fever, chills, diaphoresis, activity change, appetite change and fatigue.  HENT: Negative for ear pain, nosebleeds, congestion, facial swelling, rhinorrhea, neck pain, neck stiffness and ear discharge.   Eyes: Negative for pain, discharge, redness, itching and visual disturbance.  Respiratory: Negative for cough, choking, chest tightness, shortness of breath, wheezing and stridor.   Cardiovascular: Negative for chest pain, palpitations and leg swelling.  Gastrointestinal: Negative for abdominal distention.  Genitourinary: Negative for dysuria, urgency, frequency, hematuria, flank pain, decreased urine volume, difficulty urinating and dyspareunia.  Musculoskeletal:  Negative for back pain, joint swelling, arthralgias and gait problem.  Neurological: Negative for dizziness, tremors, seizures Hematological: Negative for adenopathy. Does not bruise/bleed easily.  Psychiatric/Behavioral: Negative for hallucinations, behavioral problems, confusion, dysphoric mood, decreased concentration and agitation.   Past Medical History:  Diagnosis Date  . Chronic foot pain    mortons neuroma  . Complication of anesthesia   . Cystocele 01/01   neg. sx  . fracture fibula ? 2003   left  . GERD (gastroesophageal reflux disease)   . Hemorrhoids   . Hyperlipemia 05/31/1992  . Hypertension 05/31/2000  . Menopausal symptoms    vasomotor symptoms-severe  . Osteoarthritis   . Osteopenia 11/29/1999  . Pedal edema    worsened by Norvasc  . Plantar fasciitis   . PONV (postoperative nausea and vomiting)     Past Surgical History:  Procedure Laterality Date  . Anterior repari w//surg proc  06/30/99   RSO  . BLADDER SUSPENSION    . BLADDER TACK    . CATARACTS    . Foot problems     neuromas  . history of CT     mass right adnexa, U?S by GYN-observation  . KYPHOPLASTY N/A 09/04/2012   Procedure: T7, T8,T12 Kyphoplasty ;  Surgeon: Clydene Fake, MD;  Location: [facility];  Service: Neurosurgery;  Laterality: N/A;  thoracic seven,eight, and twelve   . PARTIAL HYSTERECTOMY  1970   endometriosis   Social Hx:  reports that she has never smoked. She has never used smokeless tobacco. She reports that she does not drink alcohol or use drugs.  Allergies  Allergen Reactions  . Tdap [Diphth-Acell Pertussis-Tetanus] Swelling  . Amlodipine Besylate     REACTION: edema  . Codeine     headache  . Hydrocodone  hallucinations  . Lovastatin     REACTION: leg pain  . Metronidazole Swelling    And body aches  . Penicillins Itching    Unknown reaction; has tolerated cephalosporins Has patient had a PCN reaction causing immediate rash, facial/tongue/throat swelling,  SOB or lightheadedness with hypotension:unsure Has patient had a PCN reaction causing severe rash involving mucus membranes or skin necrosis:unsure Has patient had a PCN reaction that required hospitalization:unsure Has patient had a PCN reaction occurring within the last 10 years:No If all of the above answers are "NO", then may proceed with Cephalosporin use.   . Sertraline Hcl     REACTION: caused insomnia  . Simvastatin     REACTION: non tolerant- leg pain  . Latex Rash  . Sulfonamide Derivatives Rash    Family History  Problem Relation Age of Onset  . Diabetes Brother   . Hypertension Other   . Diabetes Mother   . Heart disease Mother        pacemaker  . Hypertension Mother   . COPD Father   . Diabetes Sister   . Mental illness Son        anxiety-mental health problems  . Diabetes Brother   . Diabetes Sister   . Diabetes Sister     Prior to Admission medications   Medication Sig Start Date End Date Taking? Authorizing Provider  busPIRone (BUSPAR) 15 MG tablet Take 1 tablet (15 mg total) by mouth 2 (two) times daily. 10/18/16  Yes Tower, Marne A, MD  fluticasone (FLONASE) 50 MCG/ACT nasal spray PLACE 1 TO 2 SPRAYS IN EACH NOSTRIL DAILY. 02/27/16  Yes Tower, Marne A, MD  mometasone (ELOCON) 0.1 % cream Apply to affected area in small amount 3 times weekly as needed Patient taking differently: Apply 1 application topically daily as needed (for rash).  11/10/15  Yes Tower, Audrie Gallus, MD  omeprazole (PRILOSEC) 20 MG capsule TAKE 1 CAPSULE BY MOUTH 2 TIMES DAILY. 06/21/16  Yes Tower, Audrie Gallus, MD  traMADol (ULTRAM) 50 MG tablet TAKE 1 TABLET BY MOUTH EVERY 8 HOURS AS NEEDED FOR SEVERE PAIN. 03/30/16  Yes Tower, Audrie Gallus, MD    Physical Exam: Vitals:   12/08/16 1122 12/08/16 1125 12/08/16 1441 12/08/16 1706  BP:  (!) 156/60 (!) 181/82 (!) 181/93  Pulse:  74 81 88  Resp:  16 18 16   Temp:  97.6 F (36.4 C)    TempSrc:  Oral    SpO2: 96% 99% 98% 100%  Weight:  57.6 kg (127 lb)     Height:  5' 3.5" (1.613 m)      Constitutional: NAD, calm, comfortable Vitals:   12/08/16 1122 12/08/16 1125 12/08/16 1441 12/08/16 1706  BP:  (!) 156/60 (!) 181/82 (!) 181/93  Pulse:  74 81 88  Resp:  16 18 16   Temp:  97.6 F (36.4 C)    TempSrc:  Oral    SpO2: 96% 99% 98% 100%  Weight:  57.6 kg (127 lb)    Height:  5' 3.5" (1.613 m)     Eyes: PERRL, lids and conjunctivae normal ENMT: Mucous membranes are moist. Posterior pharynx clear of any exudate or lesions.Normal dentition.  Neck: normal, supple, no masses, no thyromegaly Respiratory: clear to auscultation bilaterally, no wheezing, no crackles. Normal respiratory effort. No accessory muscle use.  Cardiovascular: Regular rate and rhythm, no rubs / gallops. No extremity edema. 2+ pedal pulses. No carotid bruits.  Abdomen: no tenderness, no masses palpated. No hepatosplenomegaly. Bowel sounds positive.  Musculoskeletal: no clubbing / cyanosis. No joint deformity upper and lower extremities. Good ROM, no contractures. Normal muscle tone.  Skin: no rashes, lesions, ulcers. No induration Neurologic: CN 2-12 grossly intact. Sensation intact, DTR normal. Strength 5/5 in all 4.  Psychiatric: Normal judgment and insight. Alert and oriented x 3. Normal mood.   Labs on Admission: I have personally reviewed following labs and imaging studies  CBC:  Recent Labs Lab 12/08/16 1132  WBC 8.9  HGB 13.2  HCT 40.2  MCV 91.6  PLT 270   Basic Metabolic Panel:  Recent Labs Lab 12/08/16 1132  NA 143  K 3.5  CL 105  CO2 30  GLUCOSE 150*  BUN 19  CREATININE 1.03*  CALCIUM 9.6   CBG:  Recent Labs Lab 12/08/16 1137  GLUCAP 118*   Urine analysis:    Component Value Date/Time   COLORURINE STRAW (A) 12/08/2016 1127   APPEARANCEUR CLEAR 12/08/2016 1127   LABSPEC 1.006 12/08/2016 1127   PHURINE 8.0 12/08/2016 1127   GLUCOSEU NEGATIVE 12/08/2016 1127   HGBUR NEGATIVE 12/08/2016 1127   HGBUR small 10/02/2009 0840    BILIRUBINUR NEGATIVE 12/08/2016 1127   BILIRUBINUR negative 10/12/2016 1418   KETONESUR NEGATIVE 12/08/2016 1127   PROTEINUR NEGATIVE 12/08/2016 1127   UROBILINOGEN 0.2 10/12/2016 1418   UROBILINOGEN 0.2 08/30/2012 2259   NITRITE NEGATIVE 12/08/2016 1127   LEUKOCYTESUR NEGATIVE 12/08/2016 1127   Radiological Exams on Admission: Dg Chest 2 View  Result Date: 12/08/2016 CLINICAL DATA:  Syncope EXAM: CHEST  2 VIEW COMPARISON:  September 03, 2012 FINDINGS: There is no edema or consolidation. Heart size and pulmonary vascularity are normal. No adenopathy. There is aortic atherosclerosis. There is evidence of an old fracture of the inferior sternum with remodeling. There is also a prior fracture of the inferior manubrium with remodeling. Patient has had previous kyphoplasty procedures at T7, T8, T12. There is increase in kyphosis at the T7-T8 level. IMPRESSION: No edema or consolidation. Stable cardiac silhouette. There is aortic atherosclerosis. Evidence of prior fractures. Patient has undergone kyphoplasty procedures in the mid and lower thoracic regions. Aortic Atherosclerosis (ICD10-I70.0). Electronically Signed   By: Bretta BangWilliam  Woodruff III M.D.   On: 12/08/2016 13:27   Ct Head Wo Contrast  Result Date: 12/08/2016 CLINICAL DATA:  Altered mental status with probable seizure EXAM: CT HEAD WITHOUT CONTRAST TECHNIQUE: Contiguous axial images were obtained from the base of the skull through the vertex without intravenous contrast. COMPARISON:  September 06, 2016 FINDINGS: Brain: Mild diffuse atrophy is stable. There is no intracranial mass, hemorrhage, extra-axial fluid collection, or midline shift. There is small vessel disease throughout the centra semiovale bilaterally. There is extensive small vessel disease in the external capsules bilaterally, particularly anteriorly. A lesser degree of small vessel disease is noted in the anterior limb of each internal capsule. There is no evident new gray-white compartment  lesion. No acute infarct appreciable. Vascular: No hyperdense vessel. There is calcification in each carotid siphon. Skull: Bony calvarium appears intact. There is a small left frontal exostosis, benign and stable. Sinuses/Orbits: There is mucosal thickening in several ethmoid air cells bilaterally. Other visualized paranasal sinuses are clear. Orbits appear symmetric bilaterally. Other: Mastoid air cells are clear. There is degenerative change in the temporomandibular joints bilaterally. IMPRESSION: Atrophy with extensive supratentorial small vessel disease, stable. No intracranial mass, hemorrhage, or extra-axial fluid collection. No evident acute infarct. Areas of arterial vascular calcification bilaterally. 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 There is mucosal thickening in several ethmoid air  cells. Electronically Signed   By: Bretta Bang III M.D.   On: 12/08/2016 15:37    EKG: Pending  Assessment/Plan Active Problems:   Syncope - unclear etiology and could be cardiogenic - Admit to telemetry unit for observation - Place patient on IV fluids, check orthostatic vitals - Plan to obtain echocardiogram for further evaluation - Would likely benefit from PT evaluation in the morning    Hypertension - Has not been on any medications at home, I will place on as needed hydralazine for now - Monitor response to as needed hydralazine and place on scheduled antihypertensive regimen in the morning if deemed appropriate   DVT prophylaxis: SCDs Code Status: full Family Communication: Pt  and daughter updated at bedside Disposition Plan: Patient will go home when medically stable Consults called: none Admission status: observation  Debbora Presto MD [facility] Pager [phone]  If 7PM-7AM, please contact night-coverage [email or web address] Password [email or web address]  12/08/2016, 7:00 PM

## 2016-12-09 ENCOUNTER — Observation Stay (HOSPITAL_COMMUNITY): Payer: PPO

## 2016-12-09 ENCOUNTER — Observation Stay (HOSPITAL_BASED_OUTPATIENT_CLINIC_OR_DEPARTMENT_OTHER)
Admit: 2016-12-09 | Discharge: 2016-12-09 | Disposition: A | Discharge status: Home / Self Care | Payer: PPO | Attending: Internal Medicine | Admitting: Internal Medicine

## 2016-12-09 DIAGNOSIS — R55 Syncope and collapse: Secondary | ICD-10-CM | POA: Diagnosis not present

## 2016-12-09 DIAGNOSIS — R569 Unspecified convulsions: Secondary | ICD-10-CM | POA: Diagnosis not present

## 2016-12-09 DIAGNOSIS — G9340 Encephalopathy, unspecified: Secondary | ICD-10-CM

## 2016-12-09 DIAGNOSIS — E785 Hyperlipidemia, unspecified: Secondary | ICD-10-CM | POA: Diagnosis not present

## 2016-12-09 DIAGNOSIS — I639 Cerebral infarction, unspecified: Secondary | ICD-10-CM | POA: Diagnosis not present

## 2016-12-09 DIAGNOSIS — R627 Adult failure to thrive: Secondary | ICD-10-CM | POA: Diagnosis not present

## 2016-12-09 DIAGNOSIS — E876 Hypokalemia: Secondary | ICD-10-CM

## 2016-12-09 DIAGNOSIS — I10 Essential (primary) hypertension: Secondary | ICD-10-CM | POA: Diagnosis not present

## 2016-12-09 DIAGNOSIS — R410 Disorientation, unspecified: Secondary | ICD-10-CM | POA: Diagnosis not present

## 2016-12-09 DIAGNOSIS — F0150 Vascular dementia without behavioral disturbance: Secondary | ICD-10-CM | POA: Diagnosis not present

## 2016-12-09 LAB — CBC
HCT: 40.7 % (ref 36.0–46.0)
Hemoglobin: 13.3 g/dL (ref 12.0–15.0)
MCH: 29.6 pg (ref 26.0–34.0)
MCHC: 32.7 g/dL (ref 30.0–36.0)
MCV: 90.6 fL (ref 78.0–100.0)
PLATELETS: 298 10*3/uL (ref 150–400)
RBC: 4.49 MIL/uL (ref 3.87–5.11)
RDW: 14.9 % (ref 11.5–15.5)
WBC: 8 10*3/uL (ref 4.0–10.5)

## 2016-12-09 LAB — BASIC METABOLIC PANEL
Anion gap: 8 (ref 5–15)
BUN: 15 mg/dL (ref 6–20)
CALCIUM: 9.4 mg/dL (ref 8.9–10.3)
CO2: 30 mmol/L (ref 22–32)
CREATININE: 0.94 mg/dL (ref 0.44–1.00)
Chloride: 106 mmol/L (ref 101–111)
GFR, EST NON AFRICAN AMERICAN: 55 mL/min — AB (ref >60)
Glucose, Bld: 98 mg/dL (ref 65–99)
Potassium: 3.3 mmol/L — ABNORMAL LOW (ref 3.5–5.1)
SODIUM: 144 mmol/L (ref 135–145)

## 2016-12-09 LAB — MAGNESIUM: Magnesium: 1.8 mg/dL (ref 1.7–2.4)

## 2016-12-09 LAB — ECHOCARDIOGRAM COMPLETE
HEIGHTINCHES: 63.5 in
WEIGHTICAEL: 2032 [oz_av]

## 2016-12-09 LAB — TSH: TSH: 2.803 u[IU]/mL (ref 0.350–4.500)

## 2016-12-09 MED ORDER — ASPIRIN 81 MG PO CHEW
81.0000 mg | CHEWABLE_TABLET | Freq: Every day | ORAL | Status: DC
Start: 2016-12-09 — End: 2016-12-10
  Administered 2016-12-09 – 2016-12-10 (×2): 81 mg via ORAL
  Filled 2016-12-09 (×2): qty 1

## 2016-12-09 MED ORDER — LORAZEPAM 0.5 MG PO TABS: 0.5000 mg | ORAL_TABLET | Freq: Once | ORAL | Status: DC

## 2016-12-09 MED ORDER — LORAZEPAM 2 MG/ML IJ SOLN
0.5000 mg | Freq: Once | INTRAMUSCULAR | Status: AC
  Administered 2016-12-09: 0.5 mg via INTRAVENOUS
  Filled 2016-12-09: qty 1

## 2016-12-09 MED ORDER — POTASSIUM CHLORIDE CRYS ER 20 MEQ PO TBCR
40.0000 meq | EXTENDED_RELEASE_TABLET | ORAL | Status: AC
  Administered 2016-12-09 (×2): 40 meq via ORAL
  Filled 2016-12-09 (×2): qty 2

## 2016-12-09 NOTE — Consult Note (Signed)
Neurology Consultation  Reason for Consult: passing out spell Referring Physician: Dr. Albertine Grates [facility])  CC: passed out followed by confusion.  History is obtained from: chart and some from patient.   HPI: Amber Rollins is a 83 y.o. female who on no medications presented to the [facility] a past medical history of hypertension hyperlipidemia and presumably dementia, on no medications had an episode of passing out at home on 12/08/2016 and was brought into the ER for evaluation. According to the daughter who brought her in per the chart review, she continued to be confused after this episode and disoriented and that's what prompted this ER visit. Patient is not able to to provide any history but she is fluently conversant and following all commands although she is a little bit upset at being here.   patient is unable to provide review of systems per chart review the review of systems is negative  LKW: >24h ago tpa given?: no, outside window Premorbid modified Rankin scale (mRS): 2-3 0-Completely asymptomatic and back to baseline post-stroke 1-No significant post stroke disability and can perform usual duties with stroke symptoms 2-Slight disability-UNABLE to perform all activities but does not need assistance  3-Moderate disability-requires help but walks WITHOUT assistance 4-Needs assistance to walk and tend to bodily needs 5-Severe disability-bedridden, incontinent, needs constant attention 6- Death  ICH Score:   ROS:  Unable to obtain reliably due to altered mental status. Attempted 14 point review of systems. She coherently denied fevers, chills, abdominal pain, nausea, vomiting, neck pain, back pain, weakness, tingling, numbness, headache, bruising/bleeding tendencies.   Past Medical History:  Diagnosis Date  . Chronic foot pain    mortons neuroma  . Complication of anesthesia   . Cystocele 01/01   neg. sx  . fracture fibula ? 2003   left  . GERD  (gastroesophageal reflux disease)   . Hemorrhoids   . Hyperlipemia 05/31/1992  . Hypertension 05/31/2000  . Menopausal symptoms    vasomotor symptoms-severe  . Osteoarthritis   . Osteopenia 11/29/1999  . Pedal edema    worsened by Norvasc  . Plantar fasciitis   . PONV (postoperative nausea and vomiting)     Family History  Problem Relation Age of Onset  . Diabetes Brother   . Hypertension Other   . Diabetes Mother   . Heart disease Mother        pacemaker  . Hypertension Mother   . COPD Father   . Diabetes Sister   . Mental illness Son        anxiety-mental health problems  . Diabetes Brother   . Diabetes Sister   . Diabetes Sister      Social History:   reports that she has never smoked. She has never used smokeless tobacco. She reports that she does not drink alcohol or use drugs.  Medication list  Current Facility-Administered Medications:  .  aspirin chewable tablet 81 mg, 81 mg, Oral, Daily, Albertine Grates, MD, 81 mg at 12/09/16 1714 .  azelastine (ASTELIN) 0.1 % nasal spray 1 spray, 1 spray, Each Nare, BID, [name], MD, 1 spray at 12/09/16 1058 .  busPIRone (BUSPAR) tablet 15 mg, 15 mg, Oral, BID, [name], MD, 15 mg at 12/09/16 1058 .  cholecalciferol (VITAMIN D) tablet 2,000 Units, 2,000 Units, Oral, Daily, [name], MD, 2,000 Units at 12/09/16 1058 .  fluticasone (FLONASE) 50 MCG/ACT nasal spray 1 spray, 1 spray, Each Nare, Daily, [name], MD, 1  spray at 12/09/16 1057 .  hydrALAZINE (APRESOLINE) injection 5 mg, 5 mg, Intravenous, Q4H PRN, Dorothea Ogle, MD, 5 mg at 12/08/16 1852 .  mometasone (ELOCON) 0.1 % cream 1 application, 1 application, Topical, Daily PRN, [name], MD .  pantoprazole (PROTONIX) EC tablet 40 mg, 40 mg, Oral, Daily, [name], MD, 40 mg at 12/09/16 1058 .  sodium chloride flush (NS) 0.9 % injection 3 mL, 3 mL, Intravenous, Q12H, [name], MD .  traMADol Janean Sark) tablet 50 mg, 50 mg, Oral, Q6H PRN,  [name], MD  Exam: Current vital signs: BP (!) 150/57 (BP Location: Right Arm)   Pulse 78   Temp 98.1 F (36.7 C) (Oral)   Resp 19   Ht 5' 3.5" (1.613 m)   Wt 57.6 kg (127 lb)   SpO2 100%   BMI 22.14 kg/m  Vital signs in last 24 hours: Temp:  [98.1 F (36.7 C)-98.2 F (36.8 C)] 98.1 F (36.7 C) (07/12 1038) Pulse Rate:  [69-86] 78 (07/12 1429) Resp:  [16-20] 19 (07/12 1429) BP: (129-153)/(57-72) 150/57 (07/12 1429) SpO2:  [96 %-100 %] 100 % (07/12 1429) Gen. exam: Awake alert, in no acute distress, oriented to self and place. HEENT: Normocephalic atraumatic, dry mucous membranes, no lymphadenopathy no thyromegaly. Lungs clear to auscultation CVS: S1-S2 regular rate and rhythm no murmur or gallop Abdomen nondistended nontender, bowel sounds present Extremities warm and well-perfused intact peripheral pulses. Neurological exam Alert awake oriented to place and person, month and year. Was able to name the president. Naming, repetition and comprehension is intact. Cranial nerve exam: Pupils equal round reactive to light, extraocular movements intact, visual fields full, no facial asymmetry, facial sensation intact, , uvula midline, tongue midline,. Motor exam: Symmetric 4/5 antigravity strength all over with normal tone and bulk. Sensation intact to light touch bilaterally with no extinction. Coordination-intact finger-nose-finger bilaterally. Gait testing was deferred at this time Deep tendon reflexes 2+ all over with downgoing toes bilaterally.   NIHSS-0   Labs CBC    Component Value Date/Time   WBC 8.0 12/09/2016 0522   RBC 4.49 12/09/2016 0522   HGB 13.3 12/09/2016 0522   HCT 40.7 12/09/2016 0522   HCT 41.0 01/01/2016 2334   PLT 298 12/09/2016 0522   MCV 90.6 12/09/2016 0522   MCH 29.6 12/09/2016 0522   MCHC 32.7 12/09/2016 0522   RDW 14.9 12/09/2016 0522   LYMPHSABS 3.1 10/12/2016 1450   MONOABS 0.7 10/12/2016 1450   EOSABS 0.3 10/12/2016 1450    BASOSABS 0.1 10/12/2016 1450    CMP     Component Value Date/Time   NA 144 12/09/2016 0522   NA 142 09/17/2016   K 3.3 (L) 12/09/2016 0522   CL 106 12/09/2016 0522   CO2 30 12/09/2016 0522   GLUCOSE 98 12/09/2016 0522   BUN 15 12/09/2016 0522   BUN 12 09/17/2016   CREATININE 0.94 12/09/2016 0522   CREATININE 1.03 12/24/2011 1514   CALCIUM 9.4 12/09/2016 0522   PROT 6.8 10/12/2016 1450   ALBUMIN 4.0 10/12/2016 1450   AST 16 10/12/2016 1450   ALT 11 10/12/2016 1450   ALKPHOS 57 10/12/2016 1450   BILITOT 0.4 10/12/2016 1450   GFRNONAA 55 (L) 12/09/2016 0522   GFRAA >60 12/09/2016 0522    glycosylated hemoglobin  Lipid Panel     Component Value Date/Time   CHOL 431 (H) 02/11/2014 1552   TRIG 325.0 (H) 02/11/2014 1552   HDL 39.40 02/11/2014 1552  CHOLHDL 11 02/11/2014 1552   VLDL 65.0 (H) 02/11/2014 1552   LDLDIRECT 344.0 02/11/2014 1552     Imaging CT-scan of the brain - IMPRESSION: Atrophy with extensive supratentorial small vessel disease, stable. No intracranial mass, hemorrhage, or extra-axial fluid collection. No evident acute infarct.  Areas of arterial vascular calcification bilaterally. 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 There is mucosal thickening in several ethmoid air cells.  MRI examination of the brain-IMPRESSION: Background pattern of brain atrophy with extensive chronic small-vessel ischemic changes throughout the brain. 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 Punctate acute infarction in the left posterior temporal lobe/occipital lobe junction region.  Although the official report says there is a punctate acute infarcts in the left posterior temporal lobe/occipital lobe junction region I have reviewed the images and it does look like that might be artifactual or does not have a clear to correlate.   EEG Impression: This awake and drowsy EEG is abnormal due to the presence of: 1. Mild to moderate diffuse slowing of  the waking background 2. Additional rare focal slowing over the left temporal region  Assessment: 83 year old over the past history of hypertension, HLD, dementia, presented for episode of passing out followed by prolonged confusion and not returning to baseline per family. The workup in the hospital revealed both EEG and MRI abnormalities. I did believe the EEG abnormalities are real, but I question the validity of the MRI findings I do not think that the punctate area of restricted diffusion represents an area of ischemia. It might be artifactual. EEG reveals findings of encephalopathy and rare focal slowing over the left temporal region, which can be an area of abnormal electrographic activity. No seizure was On the EEG..   Impression: Toxic metabolic encephalopathy MRI findings concerning for stroke-likely artifactual Possible seizure  Recommendations: --Altered mental status-could be occipital metabolic encephalopathy versus seizure versus prolonged postictal state. Has a abnormal EEG to support encephalopathy and possible focal dysfunction. At this time, I would recommend an outpatient neurological evaluation for dementia. I would avoid sedating medications. I would also recommend not starting any antiepileptics unless she has another episode that is stereotypic or similar to the prior episode. Repeat EEG should be performed at some point going forward-maybe in 4-6 weeks.   --MRI findings concerning for stroke-I think that is artifactual. Stroke workup was already been ordered by the primary team. I will follow the results in the a.m. from all the workup. Patient should be on aspirin for stroke prevention. She has allergy to statins. If tolerated, she should be on statin for stroke prevention as well.  Please call with questions  Milon DikesAshish Falan Hensler, MD [facility] [phone]  If 7pm to 7am, please call on call as listed on AMION.

## 2016-12-09 NOTE — Procedures (Signed)
ELECTROENCEPHALOGRAM REPORT  Date of Study: 12/09/2016  Patient's Name: Amber Rollins MRN: [id number] Date of Birth: [birth year]  Referring Provider: Dr. Albertine GratesFang Xu  Clinical History: This is an 83 year old woman with syncope, confusion.  Medications: busPIRone (BUSPAR) tablet 15 mg  cholecalciferol (VITAMIN D) tablet 2,000 Units  fluticasone (FLONASE) 50 MCG/ACT nasal spray 1 spray  hydrALAZINE (APRESOLINE) injection 5 mg  mometasone (ELOCON) 0.1 % cream 1 application  pantoprazole (PROTONIX) EC tablet 40 mg  potassium chloride SA (K-DUR,KLOR-CON) CR tablet 40 mEq  traMADol (ULTRAM) tablet 50 mg   Technical Summary: A multichannel digital EEG recording measured by the international 10-20 system with electrodes applied with paste and impedances below 5000 ohms performed as portable with EKG monitoring in an awake and drowsy patient.  Hyperventilation and photic stimulation were not performed.  The digital EEG was referentially recorded, reformatted, and digitally filtered in a variety of bipolar and referential montages for optimal display.   Description: The patient is awake and drowsy during the recording.  During maximal wakefulness, there is a symmetric, medium voltage 8 Hz posterior dominant rhythm that attenuates with eye opening. This is admixed with a small amount of diffuse 4-5 Hz theta and occasional 2-3 Hz delta slowing of the waking background. There is rare focal delta slowing over the left temporal region.  During drowsiness, there is an increase in theta and delta slowing of the background. Deeper stages of sleep were not seen.  Hyperventilation and photic stimulation were not performed.  There were no epileptiform discharges or electrographic seizures seen.    EKG lead showed occasional extrasystolic beats.  Impression: This awake and drowsy EEG is abnormal due to the presence of: 1. Mild to moderate diffuse slowing of the waking background 2. Additional rare focal  slowing over the left temporal region  Clinical Correlation of the above findings indicates diffuse cerebral dysfunction that is non-specific in etiology and can be seen with hypoxic/ischemic injury, toxic/metabolic encephalopathies, neurodegenerative disorders, or medication effect. Focal slowing over the left temporal region indicates focal cerebral dysfunction in this region suggestive of underlying structural or physiologic abnormality. The absence of epileptiform discharges does not rule out a clinical diagnosis of epilepsy.  Clinical correlation is advised.   Patrcia DollyKaren Hanson Medeiros, M.D.

## 2016-12-09 NOTE — Progress Notes (Signed)
EEG completed, results pending. 

## 2016-12-09 NOTE — Progress Notes (Signed)
PROGRESS NOTE  Amber SinkShirley Anne Rollins ZOX:[id number] DOB: 08-20-1930 DOA: 12/08/2016 PCP: Judy Pimpleower, Marne A, MD  HPI/Recap of past 24 hours:  Report feeling better  Assessment/Plan: Active Problems:   Syncope  Per daughter "Pt was sitting in a chair and they were talking when the patient stared forward and began to blink and then her head slumped forward.  She appeared to be breathing heavy and was unresponsive with her eyes open for about five minutes.  When she awoke she was confused for another 20 minutes and had no recollection of the episode. " Possible etiology including syncope, TIA, seizure ua unremarkable, no sign of infection Mri brain and EEG ordered, result noticed, I have discussed with neurology, will see patient in formal consult. Echo/carotic us pending Will check a1c, lipid penal ( h/o statin allergy noticed), permissive hypertension for now, start asa, she is not on asa at home, will get Pt eval Tele unremarkable here, Consider outpateint holter monitor  Hypokalemia: replace k  H/o HTN; not on meds at home, for now allow permissive hypertension  H/o  Recurrent falls: need PT eval, fall precaution  FTT, confusion She is from ALF, will likely need home health  Code Status: full  Family Communication: patient   Disposition Plan: return to alf with home health   Consultants:  neurology  Procedures:  none  Antibiotics:  none   Objective: BP (!) 150/57 (BP Location: Right Arm)   Pulse 78   Temp 98.1 F (36.7 C) (Oral)   Resp 19   Ht 5' 3.5" (1.613 m)   Wt 57.6 kg (127 lb)   SpO2 100%   BMI 22.14 kg/m   Intake/Output Summary (Last 24 hours) at 12/09/16 1634 Last data filed at 12/09/16 1345  Gross per 24 hour  Intake              480 ml  Output                0 ml  Net              480 ml   Filed Weights   12/08/16 1125  Weight: 57.6 kg (127 lb)    Exam:   General:  Frail, NAD, aaox3, impaired memory, immediate recall  2/3  Cardiovascular: RRR  Respiratory: CTABL  Abdomen: Soft/ND/NT, positive BS  Musculoskeletal: No Edema  Neuro: aaox3, impaired memory, no focal deficit noticed  Data Reviewed: Basic Metabolic Panel:  Recent Labs Lab 12/08/16 1132 12/09/16 0522  NA 143 144  K 3.5 3.3*  CL 105 106  CO2 30 30  GLUCOSE 150* 98  BUN 19 15  CREATININE 1.03* 0.94  CALCIUM 9.6 9.4  MG  --  1.8   Liver Function Tests: No results for input(s): AST, ALT, ALKPHOS, BILITOT, PROT, ALBUMIN in the last 168 hours. No results for input(s): LIPASE, AMYLASE in the last 168 hours. No results for input(s): AMMONIA in the last 168 hours. CBC:  Recent Labs Lab 12/08/16 1132 12/09/16 0522  WBC 8.9 8.0  HGB 13.2 13.3  HCT 40.2 40.7  MCV 91.6 90.6  PLT 270 298   Cardiac Enzymes:   No results for input(s): CKTOTAL, CKMB, CKMBINDEX, TROPONINI in the last 168 hours. BNP (last 3 results) No results for input(s): BNP in the last 8760 hours.  ProBNP (last 3 results) No results for input(s): PROBNP in the last 8760 hours.  CBG:  Recent Labs Lab 12/08/16 1137  GLUCAP 118*    Recent Results (  from the past 240 hour(s))  MRSA PCR Screening     Status: None   Collection Time: 12/08/16  8:58 PM  Result Value Ref Range Status   MRSA by PCR NEGATIVE NEGATIVE Final    Comment:        The GeneXpert MRSA Assay (FDA approved for NASAL specimens only), is one component of a comprehensive MRSA colonization surveillance program. It is not intended to diagnose MRSA infection nor to guide or monitor treatment for MRSA infections.      Studies: Mr Brain Wo Contrast  Result Date: 12/09/2016 CLINICAL DATA:  Episode of loss of consciousness.  Confusion. EXAM: MRI HEAD WITHOUT CONTRAST TECHNIQUE: Multiplanar, multiecho pulse sequences of the brain and surrounding structures were obtained without intravenous contrast. COMPARISON:  CT 12/08/2016 FINDINGS: Brain: Study suffers from some motion degradation.  Diffusion imaging shows a punctate focus of acute infarction in the left posterior temporal/occipital junction region. No other acute infarction is seen. There chronic small-vessel ischemic changes throughout the pons. No focal cerebellar insult. Cerebral hemispheres show confluent chronic small vessel ischemic changes affecting the white matter. There are old infarctions in the basal gangliar regions. Mild small vessel change affects the thalami. No mass lesion, acute hemorrhage, hydrocephalus or extra-axial collection. Vascular: Major vessels at the base of the brain show flow. Skull and upper cervical spine: Negative Sinuses/Orbits: Clear/normal Other: none significant IMPRESSION: Background pattern of brain atrophy with extensive chronic small-vessel ischemic changes throughout the brain. 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 Punctate acute infarction in the left posterior temporal lobe/occipital lobe junction region. Electronically Signed   By: Paulina Fusi M.D.   On: 12/09/2016 12:14    Scheduled Meds: . azelastine  1 spray Each Nare BID  . busPIRone  15 mg Oral BID  . cholecalciferol  2,000 Units Oral Daily  . fluticasone  1 spray Each Nare Daily  . pantoprazole  40 mg Oral Daily  . sodium chloride flush  3 mL Intravenous Q12H    Continuous Infusions:   Time spent: , more than 50% time spent on coordination of care.  Tyreon Frigon MD, PhD  [facility] Pager [phone]. If 7PM-7AM, please contact night-coverage at [email or web address], password [facility] 12/09/2016, 4:34 PM  LOS: 0 days

## 2016-12-09 NOTE — Care Management Note (Signed)
Case Management Note  Patient Details  Name: Lenn SinkShirley Anne Weniger MRN: [id number] Date of Birth: [birth year]  Subjective/Objective:                  syncope  Action/Plan: Date:  December 09, 2016  Chart reviewed for concurrent status and case management needs.  Will continue to follow patient progress.  Discharge Planning: following for needs  Expected discharge date: [id number]  Marcelle SmilingRhonda Grace Haggart, BSN, [place]onRN3, [place]CCM   [phone]   Expected Discharge Date:   (unknown)               Expected Discharge Plan:  Home/Self Care  In-House Referral:     Discharge planning Services  CM Consult  Post Acute Care Choice:    Choice offered to:     DME Arranged:    DME Agency:     HH Arranged:    HH Agency:     Status of Service:  In process, will continue to follow  If discussed at Long Length of Stay Meetings, dates discussed:    Additional Comments:  Golda AcreDavis, Thanos Cousineau Lynn, RN 12/09/2016, 10:25 AM

## 2016-12-09 NOTE — Progress Notes (Signed)
  Echocardiogram 2D Echocardiogram has been performed.  Leta JunglingCooper, Lyris Hitchman M 12/09/2016, 2:30 PM

## 2016-12-10 ENCOUNTER — Other Ambulatory Visit: Payer: Self-pay | Admitting: Cardiology

## 2016-12-10 ENCOUNTER — Emergency Department (HOSPITAL_COMMUNITY): Payer: PPO

## 2016-12-10 ENCOUNTER — Observation Stay (HOSPITAL_BASED_OUTPATIENT_CLINIC_OR_DEPARTMENT_OTHER): Payer: PPO

## 2016-12-10 ENCOUNTER — Inpatient Hospital Stay (HOSPITAL_COMMUNITY)
Admission: EM | Admit: 2016-12-10 | Discharge: 2016-12-13 | DRG: 101 | Disposition: A | Discharge status: Skilled Nursing Facility | Payer: PPO | Attending: Internal Medicine | Admitting: Internal Medicine

## 2016-12-10 ENCOUNTER — Encounter (HOSPITAL_COMMUNITY): Payer: Self-pay

## 2016-12-10 DIAGNOSIS — R55 Syncope and collapse: Secondary | ICD-10-CM | POA: Diagnosis not present

## 2016-12-10 DIAGNOSIS — R569 Unspecified convulsions: Secondary | ICD-10-CM | POA: Diagnosis not present

## 2016-12-10 DIAGNOSIS — G9340 Encephalopathy, unspecified: Secondary | ICD-10-CM | POA: Diagnosis not present

## 2016-12-10 DIAGNOSIS — G40209 Localization-related (focal) (partial) symptomatic epilepsy and epileptic syndromes with complex partial seizures, not intractable, without status epilepticus: Secondary | ICD-10-CM | POA: Diagnosis present

## 2016-12-10 DIAGNOSIS — I639 Cerebral infarction, unspecified: Secondary | ICD-10-CM

## 2016-12-10 DIAGNOSIS — E785 Hyperlipidemia, unspecified: Secondary | ICD-10-CM

## 2016-12-10 DIAGNOSIS — I10 Essential (primary) hypertension: Secondary | ICD-10-CM | POA: Diagnosis not present

## 2016-12-10 DIAGNOSIS — E876 Hypokalemia: Secondary | ICD-10-CM | POA: Diagnosis not present

## 2016-12-10 DIAGNOSIS — F0390 Unspecified dementia without behavioral disturbance: Secondary | ICD-10-CM | POA: Diagnosis present

## 2016-12-10 DIAGNOSIS — F0150 Vascular dementia without behavioral disturbance: Secondary | ICD-10-CM | POA: Diagnosis not present

## 2016-12-10 DIAGNOSIS — R627 Adult failure to thrive: Secondary | ICD-10-CM | POA: Diagnosis not present

## 2016-12-10 LAB — DIFFERENTIAL
Basophils Absolute: 0 10*3/uL (ref 0.0–0.1)
Basophils Relative: 0 %
EOS ABS: 0.2 10*3/uL (ref 0.0–0.7)
EOS PCT: 2 %
LYMPHS ABS: 4 10*3/uL (ref 0.7–4.0)
Lymphocytes Relative: 36 %
MONO ABS: 1.1 10*3/uL — AB (ref 0.1–1.0)
Monocytes Relative: 10 %
Neutro Abs: 5.8 10*3/uL (ref 1.7–7.7)
Neutrophils Relative %: 52 %

## 2016-12-10 LAB — CBC
HCT: 43.9 % (ref 36.0–46.0)
Hemoglobin: 14.2 g/dL (ref 12.0–15.0)
MCH: 29.6 pg (ref 26.0–34.0)
MCHC: 32.3 g/dL (ref 30.0–36.0)
MCV: 91.6 fL (ref 78.0–100.0)
PLATELETS: 290 10*3/uL (ref 150–400)
RBC: 4.79 MIL/uL (ref 3.87–5.11)
RDW: 15.4 % (ref 11.5–15.5)
WBC: 11.1 10*3/uL — ABNORMAL HIGH (ref 4.0–10.5)

## 2016-12-10 LAB — VAS US CAROTID
LCCAPSYS: 98 cm/s
LEFT ECA DIAS: -1 cm/s
LEFT VERTEBRAL DIAS: -8 cm/s
Left CCA dist dias: 7 cm/s
Left CCA dist sys: 69 cm/s
Left CCA prox dias: 4 cm/s
Left ICA dist dias: -12 cm/s
Left ICA dist sys: -81 cm/s
Left ICA prox dias: -15 cm/s
Left ICA prox sys: -77 cm/s
RCCADSYS: -65 cm/s
RIGHT ECA DIAS: -4 cm/s
RIGHT VERTEBRAL DIAS: -6 cm/s
Right CCA prox dias: 2 cm/s
Right CCA prox sys: 70 cm/s

## 2016-12-10 LAB — COMPREHENSIVE METABOLIC PANEL
ALK PHOS: 66 U/L (ref 38–126)
ALT: 14 U/L (ref 14–54)
ANION GAP: 15 (ref 5–15)
AST: 23 U/L (ref 15–41)
Albumin: 3.9 g/dL (ref 3.5–5.0)
BILIRUBIN TOTAL: 0.5 mg/dL (ref 0.3–1.2)
BUN: 24 mg/dL — ABNORMAL HIGH (ref 6–20)
CALCIUM: 9.6 mg/dL (ref 8.9–10.3)
CO2: 22 mmol/L (ref 22–32)
Chloride: 101 mmol/L (ref 101–111)
Creatinine, Ser: 1.86 mg/dL — ABNORMAL HIGH (ref 0.44–1.00)
GFR calc non Af Amer: 24 mL/min — ABNORMAL LOW (ref >60)
GFR, EST AFRICAN AMERICAN: 28 mL/min — AB (ref >60)
Glucose, Bld: 123 mg/dL — ABNORMAL HIGH (ref 65–99)
Potassium: 4.2 mmol/L (ref 3.5–5.1)
Sodium: 138 mmol/L (ref 135–145)
TOTAL PROTEIN: 6.5 g/dL (ref 6.5–8.1)

## 2016-12-10 LAB — LIPID PANEL
CHOL/HDL RATIO: 6.8 ratio
Cholesterol: 376 mg/dL — ABNORMAL HIGH (ref 0–200)
HDL: 55 mg/dL (ref >40)
LDL CALC: 300 mg/dL — AB (ref 0–99)
Triglycerides: 104 mg/dL (ref <150)
VLDL: 21 mg/dL (ref 0–40)

## 2016-12-10 LAB — BASIC METABOLIC PANEL
Anion gap: 9 (ref 5–15)
BUN: 15 mg/dL (ref 6–20)
CHLORIDE: 107 mmol/L (ref 101–111)
CO2: 28 mmol/L (ref 22–32)
CREATININE: 0.94 mg/dL (ref 0.44–1.00)
Calcium: 9.6 mg/dL (ref 8.9–10.3)
GFR calc Af Amer: >60 mL/min (ref >60)
GFR calc non Af Amer: 55 mL/min — ABNORMAL LOW (ref >60)
Glucose, Bld: 104 mg/dL — ABNORMAL HIGH (ref 65–99)
POTASSIUM: 3.8 mmol/L (ref 3.5–5.1)
SODIUM: 144 mmol/L (ref 135–145)

## 2016-12-10 LAB — PROTIME-INR
INR: 1.01
PROTHROMBIN TIME: 13.3 s (ref 11.4–15.2)

## 2016-12-10 LAB — CBG MONITORING, ED: GLUCOSE-CAPILLARY: 155 mg/dL — AB (ref 65–99)

## 2016-12-10 LAB — MAGNESIUM: MAGNESIUM: 1.9 mg/dL (ref 1.7–2.4)

## 2016-12-10 LAB — GLUCOSE, CAPILLARY: GLUCOSE-CAPILLARY: 113 mg/dL — AB (ref 65–99)

## 2016-12-10 LAB — APTT: aPTT: 27 seconds (ref 24–36)

## 2016-12-10 MED ORDER — ASPIRIN 81 MG PO CHEW: 81.0000 mg | CHEWABLE_TABLET | Freq: Every day | ORAL | 0 refills | Status: AC

## 2016-12-10 MED ORDER — EZETIMIBE 10 MG PO TABS: 10.0000 mg | ORAL_TABLET | Freq: Every day | ORAL | 11 refills | Status: DC

## 2016-12-10 MED ORDER — METOPROLOL TARTRATE 25 MG PO TABS: 12.5000 mg | ORAL_TABLET | Freq: Two times a day (BID) | ORAL | 0 refills | Status: AC

## 2016-12-10 MED ORDER — SODIUM CHLORIDE 0.9 % IV SOLN
50.0000 mg | Freq: Two times a day (BID) | INTRAVENOUS | Status: DC
  Administered 2016-12-10 – 2016-12-13 (×6): 50 mg via INTRAVENOUS
  Filled 2016-12-10 (×13): qty 5

## 2016-12-10 MED ORDER — OMEGA-3-ACID ETHYL ESTERS 1 G PO CAPS: 1.0000 g | ORAL_CAPSULE | Freq: Every day | ORAL | 0 refills | Status: DC

## 2016-12-10 NOTE — Evaluation (Signed)
Physical Therapy Evaluation Patient Details Name: Amber Rollins MRN: [id number] DOB: 05-Mar-1931 Today's Date: 12/10/2016   History of Present Illness  Amber Rollins is a 83 y.o. female with hypertension and hyperlipidemia, not on any treatment for it, presented to emergency department after an episode of passing out at home. Please note that patient was somewhat confused on admission and daughter at bedside provided more information. Daughter explains that patient was sitting with family and talking, became suddenly unresponsive and passed out for several minutes. When patient woke up she was disoriented and not able to recall any details of the event.Marland Kitchen PMH includes falls and hx of kyphoplasty T-7, T-8 and T12 in 2014 s/p fall through attic floor  Clinical Impression  Pt admitted with above diagnosis. Pt currently with functional limitations due to the deficits listed below (see PT Problem List). Pt will benefit from skilled PT to increase their independence and safety with mobility to allow discharge to the venue listed below.  Pt initially confused with short term memory issues.  By the end of the session, pt was able to recall where she was and why she was here.  Anticipate that she should do well once back in her own environment, but recommend HHPT at her ALF and initial S with her mobility.    Follow Up Recommendations Home health PT;Supervision/Assistance - 24 hour;Supervision for mobility/OOB    Equipment Recommendations  None recommended by PT    Recommendations for Other Services       Precautions / Restrictions Precautions Precautions: Fall Restrictions Weight Bearing Restrictions: No      Mobility  Bed Mobility Overal bed mobility: Needs Assistance Bed Mobility: Supine to Sit     Supine to sit: Min guard     General bed mobility comments: cues for technique  Transfers Overall transfer level: Needs assistance Equipment used: Rolling walker (2  wheeled) Transfers: Sit to/from Stand Sit to Stand: Min assist;Min guard         General transfer comment: MIN A from bed with cues for hand placement.  Did repetitive sit > stands from chair to practice and able to do with min/guard and cues for safety. Some posterior bias noted.  Ambulation/Gait Ambulation/Gait assistance: Min guard Ambulation Distance (Feet): 200 Feet Assistive device: Rolling walker (2 wheeled) Gait Pattern/deviations: Decreased step length - right;Decreased step length - left;Step-through pattern     General Gait Details: MIN/guard for safety with gait, but no LOB with RW  Stairs            Wheelchair Mobility    Modified Rankin (Stroke Patients Only)       Balance Overall balance assessment: History of Falls;Needs assistance   Sitting balance-Leahy Scale: Fair       Standing balance-Leahy Scale: Poor Standing balance comment: posterior bias                             Pertinent Vitals/Pain Pain Assessment: No/denies pain    Home Living Family/patient expects to be discharged to:: Assisted living               Home Equipment: Walker - 4 wheels      Prior Function Level of Independence: Independent with assistive device(s)         Comments: Amb with rollator     Hand Dominance        Extremity/Trunk Assessment   Upper Extremity Assessment Upper Extremity Assessment: Overall [facility]  for tasks assessed    Lower Extremity Assessment Lower Extremity Assessment: Overall WFL for tasks assessed;Generalized weakness    Cervical / Trunk Assessment Cervical / Trunk Assessment: Kyphotic  Communication   Communication: No difficulties  Cognition Arousal/Alertness: Awake/alert Behavior During Therapy: WFL for tasks assessed/performed Overall Cognitive Status: Impaired/Different from baseline Area of Impairment: Memory;Awareness;Safety/judgement                     Memory: Decreased short-term memory    Safety/Judgement: Decreased awareness of safety Awareness: Intellectual   General Comments: By end of session, pt was able to recall why she was in hospital and where she was.       General Comments      Exercises     Assessment/Plan    PT Assessment Patient needs continued PT services  PT Problem List Decreased strength;Decreased range of motion;Decreased mobility;Decreased safety awareness       PT Treatment Interventions DME instruction;Gait training;Therapeutic activities;Therapeutic exercise;Functional mobility training    PT Goals (Current goals can be found in the Care Plan section)  Acute Rehab PT Goals Patient Stated Goal: to go home PT Goal Formulation: With patient Time For Goal Achievement: 12/17/16 Potential to Achieve Goals: Good    Frequency Min 3X/week   Barriers to discharge        Co-evaluation               AM-PAC PT "6 Clicks" Daily Activity  Outcome Measure Difficulty turning over in bed (including adjusting bedclothes, sheets and blankets)?: A Little Difficulty moving from lying on back to sitting on the side of the bed? : A Little Difficulty sitting down on and standing up from a chair with arms (e.g., wheelchair, bedside commode, etc,.)?: Total Help needed moving to and from a bed to chair (including a wheelchair)?: A Little Help needed walking in hospital room?: A Little Help needed climbing 3-5 steps with a railing? : A Little 6 Click Score: 16    End of Session Equipment Utilized During Treatment: Gait belt Activity Tolerance: Patient tolerated treatment well Patient left: in chair;with call bell/phone within reach;with chair alarm set;with nursing/sitter in room Nurse Communication: Mobility status PT Visit Diagnosis: Other abnormalities of gait and mobility (R26.89);History of falling (Z91.81)    Time: [id number] PT Time Calculation (min) (ACUTE ONLY): 26 min   Charges:   PT Evaluation $PT Eval Moderate Complexity: 1  Procedure PT Treatments $Gait Training: 8-22 mins   PT G Codes:   PT G-Codes **NOT FOR INPATIENT CLASS** Functional Assessment Tool Used: AM-PAC 6 Clicks Basic Mobility;Clinical judgement Functional Limitation: Mobility: Walking and moving around Mobility: Walking and Moving Around Current Status ([id number]): At least 40 percent but less than 60 percent impaired, limited or restricted Mobility: Walking and Moving Around Goal Status [phone]): At least 1 percent but less than 20 percent impaired, limited or restricted    Clydie BraunKaren L. Katrinka BlazingSmith, [place]PT Pager [phone] 12/10/2016   Enzo MontgomeryKaren L Tayli Buch 12/10/2016, 10:35 AM

## 2016-12-10 NOTE — ED Notes (Addendum)
Blood sent to main lab,  Waiting on code stroke lab orders to enter.

## 2016-12-10 NOTE — Progress Notes (Signed)
[facility] called to [facility] to [place], LPN. Notified that daughter will be providing transportation.

## 2016-12-10 NOTE — Progress Notes (Signed)
VASCULAR LAB PRELIMINARY  PRELIMINARY  PRELIMINARY  PRELIMINARY  Carotid duplex completed.    Preliminary report:  Bilateral:  1-39% ICA stenosis.  Vertebral artery flow is antegrade.     Tarris Delbene, RVS 12/10/2016, 1:13 PM

## 2016-12-10 NOTE — ED Provider Notes (Signed)
[facility]-EMERGENCY DEPT Provider Note   CSN: [id number] Arrival date & time: 12/10/16  1939   An emergency department physician performed an initial assessment on this suspected stroke patient at 35.  History   Chief Complaint Chief Complaint  Patient presents with  . Code Stroke    HPI Amber Rollins is a 83 y.o. female.  The history is provided by the patient and a caregiver.  Illness  This is a recurrent problem. The current episode started 6 to 12 hours ago. The problem occurs constantly. The problem has been resolved. Pertinent negatives include no chest pain, no abdominal pain, no headaches and no shortness of breath. Associated symptoms comments: Syncope. Nothing aggravates the symptoms. Nothing relieves the symptoms. She has tried nothing for the symptoms.    Past Medical History:  Diagnosis Date  . Chronic foot pain    mortons neuroma  . Complication of anesthesia   . Cystocele 01/01   neg. sx  . fracture fibula ? 2003   left  . GERD (gastroesophageal reflux disease)   . Hemorrhoids   . Hyperlipemia 05/31/1992  . Hypertension 05/31/2000  . Menopausal symptoms    vasomotor symptoms-severe  . Osteoarthritis   . Osteopenia 11/29/1999  . Pedal edema    worsened by Norvasc  . Plantar fasciitis   . PONV (postoperative nausea and vomiting)     Patient Active Problem List   Diagnosis Date Noted  . Syncope 12/08/2016  . Altered mental state 10/12/2016  . Foot pain, bilateral 08/23/2016  . Diarrhea 03/29/2016  . Hyponatremia 01/01/2016  . Routine general medical examination at a health care facility 11/10/2015  . Urinary frequency 10/23/2014  . Generalized weakness 10/23/2014  . Hammer toe 09/20/2014  . Herpetic dermatitis 08/09/2014  . Poor balance 04/11/2013  . Thoracic back pain 04/11/2013  . Urinary incontinence 12/27/2012  . Thoracic kyphosis 09/24/2012  . Fall 08/09/2012  . Hemorrhoid 12/24/2011  . Dysuria 12/24/2011  . Low back pain  12/24/2011  . Hypokalemia 10/06/2010  . HEMATURIA UNSPECIFIED 10/02/2009  . NIGHT SWEATS 10/02/2009  . GERD 06/18/2009  . INCONTINENCE, URGE 06/18/2009  . FATIGUE 03/12/2009  . HYPOKALEMIA 10/08/2008  . MENOPAUSAL SYNDROME 01/22/2008  . EDEMA 07/19/2007  . Anxiety and depression 06/21/2007  . FOOT PAIN, CHRONIC 03/06/2007  . ALLERGIC RHINITIS, SEASONAL 09/12/2006  . IRRITABLE BOWEL SYNDROME 09/12/2006  . FIBROCYSTIC BREAST DISEASE 09/12/2006  . OSTEOARTHRITIS 09/12/2006  . HEMORRHOIDS, HX OF 09/12/2006  . Essential hypertension 05/31/2000  . Disorder of bone and cartilage 11/29/1999  . Hyperlipidemia 05/31/1992    Past Surgical History:  Procedure Laterality Date  . Anterior repari w//surg proc  06/30/99   RSO  . BLADDER SUSPENSION    . BLADDER TACK    . CATARACTS    . Foot problems     neuromas  . history of CT     mass right adnexa, U?S by GYN-observation  . KYPHOPLASTY N/A 09/04/2012   Procedure: T7, T8,T12 Kyphoplasty ;  Surgeon: Clydene Fake, MD;  Location: [facility];  Service: Neurosurgery;  Laterality: N/A;  thoracic seven,eight, and twelve   . PARTIAL HYSTERECTOMY  1970   endometriosis    OB History    No data available       Home Medications    Prior to Admission medications   Medication Sig Start Date End Date Taking? Authorizing Provider  acetaminophen (TYLENOL) 325 MG tablet Take 650 mg by mouth every 4 (four) hours as needed (for pain).  Yes [provider]  aspirin 81 MG chewable tablet Chew 1 tablet (81 mg total) by mouth daily. 12/11/16  Yes Albertine Grates, MD  azelastine (ASTELIN) 0.1 % nasal spray PLACE 2 SPRAYS INTO BOTH NOSTRILS 2 TIMES DAILY. 02/27/16  Yes Tower, Audrie Gallus, MD  busPIRone (BUSPAR) 15 MG tablet Take 1 tablet (15 mg total) by mouth 2 (two) times daily. 10/18/16  Yes Tower, Audrie Gallus, MD  Cholecalciferol (VITAMIN D3) 2000 UNITS TABS Take 1 tablet by mouth daily with breakfast.    Yes [provider]  ezetimibe (ZETIA)  10 MG tablet Take 1 tablet (10 mg total) by mouth daily. 12/10/16 12/10/17 Yes Albertine Grates, MD  fluticasone (FLONASE) 50 MCG/ACT nasal spray PLACE 1 TO 2 SPRAYS IN EACH NOSTRIL DAILY. Patient taking differently: Instill 1-2 sprays into each nostril once a day 02/27/16  Yes Tower, Audrie Gallus, MD  metoprolol tartrate (LOPRESSOR) 25 MG tablet Take 0.5 tablets (12.5 mg total) by mouth 2 (two) times daily. Patient taking differently: Take 25 mg by mouth 2 (two) times daily.  12/10/16 12/10/17 Yes Albertine Grates, MD  mometasone (ELOCON) 0.1 % cream Apply to affected area in small amount 3 times weekly as needed Patient taking differently: Apply 1 application topically See admin instructions. Three times a week to affected area(s) as needed 11/10/15  Yes Tower, Audrie Gallus, MD  omega-3 acid ethyl esters (LOVAZA) 1 g capsule Take 1 capsule (1 g total) by mouth daily. 12/10/16  Yes Albertine Grates, MD  omeprazole (PRILOSEC) 20 MG capsule TAKE 1 CAPSULE BY MOUTH 2 TIMES DAILY. 06/21/16  Yes Tower, Audrie Gallus, MD  traMADol (ULTRAM) 50 MG tablet TAKE 1 TABLET BY MOUTH EVERY 8 HOURS AS NEEDED FOR SEVERE PAIN. 03/30/16  Yes Tower, Audrie Gallus, MD    Family History Family History  Problem Relation Age of Onset  . Diabetes Brother   . Hypertension Other   . Diabetes Mother   . Heart disease Mother        pacemaker  . Hypertension Mother   . COPD Father   . Diabetes Sister   . Mental illness Son        anxiety-mental health problems  . Diabetes Brother   . Diabetes Sister   . Diabetes Sister     Social History Social History  Substance Use Topics  . Smoking status: Never Smoker  . Smokeless tobacco: Never Used  . Alcohol use No     Allergies   Tdap [diphth-acell pertussis-tetanus]; Amlodipine besylate; Codeine; Hydrocodone; Lovastatin; Metronidazole; Oxycodone; Penicillins; Sertraline hcl; Simvastatin; Tape; Latex; and Sulfonamide derivatives   Review of Systems Review of Systems  Constitutional: Negative for chills and  fever.  HENT: Negative for ear pain and sore throat.   Eyes: Negative for pain and visual disturbance.  Respiratory: Negative for cough and shortness of breath.   Cardiovascular: Negative for chest pain and palpitations.  Gastrointestinal: Negative for abdominal pain and vomiting.  Endocrine: Negative for polyuria.  Genitourinary: Negative for dysuria and hematuria.  Musculoskeletal: Negative for arthralgias and back pain.  Skin: Negative for color change and rash.  Neurological: Positive for syncope and weakness. Negative for dizziness, seizures and headaches.  Psychiatric/Behavioral: Negative for agitation and behavioral problems.  All other systems reviewed and are negative.    Physical Exam Updated Vital Signs BP (!) 170/78   Pulse 81   Temp 97.8 F (36.6 C)   Resp 18   Ht 5\' 3"  (1.6 m)   Wt 59 kg (  130 lb 1.1 oz)   SpO2 97%   BMI 23.04 kg/m   Physical Exam  Constitutional: She appears well-developed and well-nourished. No distress.  HENT:  Head: Normocephalic and atraumatic.  Mouth/Throat: Oropharynx is clear and moist.  Eyes: Pupils are equal, round, and reactive to light. Conjunctivae and EOM are normal.  Neck: Normal range of motion. No tracheal deviation present.  Cardiovascular: Normal rate and intact distal pulses.   Pulmonary/Chest: Effort normal. No respiratory distress.  Abdominal: Soft. She exhibits no distension.  Musculoskeletal: She exhibits no deformity.  Neurological: She is alert. No cranial nerve deficit or sensory deficit. She exhibits normal muscle tone. Coordination normal.  Skin: Skin is warm and dry. She is not diaphoretic.  Psychiatric: She has a normal mood and affect.     ED Treatments / Results  Labs (all labs ordered are listed, but only abnormal results are displayed) Labs Reviewed  CBC - Abnormal; Notable for the following:       Result Value   WBC 11.1 (*)    All other components within normal limits  DIFFERENTIAL - Abnormal;  Notable for the following:    Monocytes Absolute 1.1 (*)    All other components within normal limits  COMPREHENSIVE METABOLIC PANEL - Abnormal; Notable for the following:    Glucose, Bld 123 (*)    BUN 24 (*)    Creatinine, Ser 1.86 (*)    GFR calc non Af Amer 24 (*)    GFR calc Af Amer 28 (*)    All other components within normal limits  CBG MONITORING, ED - Abnormal; Notable for the following:    Glucose-Capillary 155 (*)    All other components within normal limits  PROTIME-INR  APTT  I-STAT TROPOININ, ED  I-STAT CHEM 8, ED  I-STAT TROPOININ, ED  CBG MONITORING, ED  I-STAT CHEM 8, ED    EKG  EKG Interpretation None       Radiology Mr Brain Wo Contrast  Result Date: 12/09/2016 CLINICAL DATA:  Episode of loss of consciousness.  Confusion. EXAM: MRI HEAD WITHOUT CONTRAST TECHNIQUE: Multiplanar, multiecho pulse sequences of the brain and surrounding structures were obtained without intravenous contrast. COMPARISON:  CT 12/08/2016 FINDINGS: Brain: Study suffers from some motion degradation. Diffusion imaging shows a punctate focus of acute infarction in the left posterior temporal/occipital junction region. No other acute infarction is seen. There chronic small-vessel ischemic changes throughout the pons. No focal cerebellar insult. Cerebral hemispheres show confluent chronic small vessel ischemic changes affecting the white matter. There are old infarctions in the basal gangliar regions. Mild small vessel change affects the thalami. No mass lesion, acute hemorrhage, hydrocephalus or extra-axial collection. Vascular: Major vessels at the base of the brain show flow. Skull and upper cervical spine: Negative Sinuses/Orbits: Clear/normal Other: none significant IMPRESSION: Background pattern of brain atrophy with extensive chronic small-vessel ischemic changes throughout the brain. 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 Punctate acute infarction in the left posterior temporal  lobe/occipital lobe junction region. Electronically Signed   By: Paulina FusiMark  Shogry M.D.   On: 12/09/2016 12:14   Ct Head Code Stroke W/o Cm  Result Date: 12/10/2016 CLINICAL DATA:  Code stroke. Rule out stroke. Altered mental status. EXAM: CT HEAD WITHOUT CONTRAST TECHNIQUE: Contiguous axial images were obtained from the base of the skull through the vertex without intravenous contrast. COMPARISON:  12/08/2016 CT head.  12/09/2016 MRI head. FINDINGS: Brain: No evidence of acute infarction, hemorrhage, hydrocephalus, extra-axial collection or mass lesion/mass effect. Stable chronic infarcts  within the bilateral anterior basal ganglia, advanced chronic microvascular ischemic changes of the brain, and moderate parenchymal volume loss. Vascular: No hyperdense vessel identified. Calcific atherosclerosis of carotid siphons and vertebral arteries. Skull: Normal. Negative for fracture or focal lesion. Sinuses/Orbits: No acute finding. Other: None. ASPECTS [facility] Stroke Program Early CT Score) - Ganglionic level infarction (caudate, lentiform nuclei, internal capsule, insula, M1-M3 cortex): 7 - Supraganglionic infarction (M4-M6 cortex): 3 Total score (0-10 with 10 being normal): 10 IMPRESSION: 1. No acute intracranial abnormality identified. 2. ASPECTS is 10 3. Stable advanced chronic microvascular ischemic changes of white matter, moderate parenchymal volume loss of the brain, and chronic lacunar infarcts within the anterior basal ganglia bilaterally. These results were called by telephone at the time of interpretation on 12/10/2016 at 8:04 pm to Dr. Gracy Racer, who verbally acknowledged these results. Electronically Signed   By: Mitzi Hansen M.D.   On: 12/10/2016 20:05    Procedures Procedures (including critical care time)  Medications Ordered in ED Medications  lacosamide (VIMPAT) 50 mg in sodium chloride 0.9 % 25 mL IVPB (0 mg Intravenous Stopped 12/10/16 2141)     Initial Impression / Assessment  and Plan / ED Course  I have reviewed the triage vital signs and the nursing notes.  Pertinent labs & imaging results that were available during my care of the patient were reviewed by me and considered in my medical decision making (see chart for details).     Patient presenting with syncope. She had a similar event 2 days ago where she had some staring as well as non-responsiveness and then had an episode of syncope. Workup negative at that time including negative CT and negative MRI brain for acute infarcts. Today, Apparently she was walking at her facility, sat down and then had a syncopal episode. Did not hit her head. Patient Initially alert but not oriented. Has improved while here in the emergency department. Initially seen by neurology who started her on Vimpat and recommended admission for EEG. Patient's neurological exam is afocal and CT head negative here and unchanged from prior, doubt stroke and likely secondary to seizure or dementia. TPA not indicated, Troponin negative. Also doubt ACS.  Will be admitted for further care. Stable while under my care in the emergency department.  Patient was seen with my attending, Dr. Rosalia Hammers, who voiced agreement and oversaw the evaluation and treatment of this patient.   Dragon [employer] illustrator was used in the creation of this note. If there are any errors or inconsistencies needing clarification, please contact me directly.   Final Clinical Impressions(s) / ED Diagnoses   Final diagnoses:  Syncope, unspecified syncope type    New Prescriptions New Prescriptions   No medications on file     Orson Slick, MD 12/10/16 2324    Margarita Grizzle, MD 12/12/16 [phone]

## 2016-12-10 NOTE — Clinical Social Work Note (Signed)
Clinical Social Work Assessment  Patient Details  Name: Amber Rollins MRN: [id number] Date of Birth: [birth year]  Date of referral:  12/10/16               Reason for consult:  Facility Placement                Permission sought to share information with:  Family Supports Permission granted to share information::     Name::       [facility]  Agency::     Relationship::  Daughter  Contact Information:    [phone]  Housing/Transportation Living arrangements for the past 2 months:  Assisted Living Facility Source of Information:  Adult Children Patient Interpreter Needed:  None Criminal Activity/Legal Involvement Pertinent to Current Situation/Hospitalization:  No - Comment as needed Significant Relationships:  Adult Children Lives with:  Facility Resident Do you feel safe going back to the place where you live?  Yes Need for family participation in patient care:  Yes (Comment)  Care giving concerns:  Syncope    Social Worker assessment / plan:  CSW spoke with patient daughter about discharge plan. [facility] Patient moved in June 2018.Patient has transitioned well. Patient is able to ambulate with walker, PT recommends HomeHealth to follow pt. Back to facility. Patient daughter will transport by car. CSW confirmed with facility, pt. Able to return.  FL2 Completed.   Employment status:  Retired Health and safety inspectornsurance information:    PT Recommendations:  [facility] Information / Referral to community resources:     Patient/Family's Response to care:  Agreeable and Responding to care.  Patient/Family's Understanding of and Emotional Response to Diagnosis, Current Treatment, and Prognosis:  " [facility]   Emotional Assessment Appearance:    Attitude/Demeanor/Rapport:    Affect (typically observed):  Accepting Orientation:  Oriented to Self, Oriented to  Time, Oriented to  Situation Alcohol / Substance use:  Not Applicable Psych involvement (Current and /or in the community):  No (Comment)  Discharge Needs  Concerns to be addressed:  Discharge Planning Concerns Readmission within the last 30 days:  No Current discharge risk:  None Barriers to Discharge:  Continued Medical Work up   [employer] Cressie Betzler, LCSW 12/10/2016, 11:10 AM

## 2016-12-10 NOTE — Consult Note (Signed)
Reason for Consult:  Code Stroke Referring Physician:  ER  Amber Rollins is an 83 y.o. female.  HPI:  She was sitting in a chair at SNF when she was found with LOC.  Initial event was not seen.  She had a similar event 2 days ago when she was noted to have blinking of eyes, staring, and non-responsiveness followed by syncope.  CT Brain negative for acute changes at the time.  MRI Brain showed extensive atrophy, periventricular white matter disease, old bilateral BG infarcts, and a tiny inconsequential posterior temporal infarct. EEG had shown generalized slowing, more prominent in left temporal.  No epileptiforms.  She was discharged without any AEDs.  Today, her CT Brain is unchanged from prior.  While in the ER she progressively improved in her mental status and there was no focal deficit.  She has known history of dementia, but is not on Aricept or Namenda for unknown reason.  She is on Tramadol for pain.    Past Medical History:  Diagnosis Date  . Chronic foot pain    mortons neuroma  . Complication of anesthesia   . Cystocele 01/01   neg. sx  . fracture fibula ? 2003   left  . GERD (gastroesophageal reflux disease)   . Hemorrhoids   . Hyperlipemia 05/31/1992  . Hypertension 05/31/2000  . Menopausal symptoms    vasomotor symptoms-severe  . Osteoarthritis   . Osteopenia 11/29/1999  . Pedal edema    worsened by Norvasc  . Plantar fasciitis   . PONV (postoperative nausea and vomiting)     Past Surgical History:  Procedure Laterality Date  . Anterior repari w//surg proc  06/30/99   RSO  . BLADDER SUSPENSION    . BLADDER TACK    . CATARACTS    . Foot problems     neuromas  . history of CT     mass right adnexa, U?S by GYN-observation  . KYPHOPLASTY N/A 09/04/2012   Procedure: T7, T8,T12 Kyphoplasty ;  Surgeon: Clydene Fake, MD;  Location: [facility];  Service: Neurosurgery;  Laterality: N/A;  thoracic seven,eight, and twelve   . PARTIAL HYSTERECTOMY  1970   endometriosis    Family History  Problem Relation Age of Onset  . Diabetes Brother   . Hypertension Other   . Diabetes Mother   . Heart disease Mother        pacemaker  . Hypertension Mother   . COPD Father   . Diabetes Sister   . Mental illness Son        anxiety-mental health problems  . Diabetes Brother   . Diabetes Sister   . Diabetes Sister     Social History:  reports that she has never smoked. She has never used smokeless tobacco. She reports that she does not drink alcohol or use drugs.  Allergies:  Allergies  Allergen Reactions  . Tdap [Diphth-Acell Pertussis-Tetanus] Swelling  . Amlodipine Besylate     REACTION: edema  . Codeine     headache  . Hydrocodone     hallucinations  . Lovastatin     REACTION: leg pain  . Metronidazole Swelling    And body aches  . Penicillins Itching    Unknown reaction; has tolerated cephalosporins Has patient had a PCN reaction causing immediate rash, facial/tongue/throat swelling, SOB or lightheadedness with hypotension:unsure Has patient had a PCN reaction causing severe rash involving mucus membranes or skin necrosis:unsure Has patient had a PCN reaction that required hospitalization:unsure Has  patient had a PCN reaction occurring within the last 10 years:No If all of the above answers are "NO", then may proceed with Cephalosporin use.   . Sertraline Hcl     REACTION: caused insomnia  . Simvastatin     REACTION: non tolerant- leg pain  . Latex Rash  . Sulfonamide Derivatives Rash    Prior to Admission medications   Medication Sig Start Date End Date Taking? Authorizing Provider  acetaminophen (TYLENOL) 325 MG tablet Take 650 mg by mouth every 4 (four) hours as needed.    [provider]  aspirin 81 MG chewable tablet Chew 1 tablet (81 mg total) by mouth daily. 12/11/16   Albertine GratesXu, Fang, MD  azelastine (ASTELIN) 0.1 % nasal spray PLACE 2 SPRAYS INTO BOTH NOSTRILS 2 TIMES DAILY. 02/27/16   Tower, Audrie GallusMarne A, MD  busPIRone  (BUSPAR) 15 MG tablet Take 1 tablet (15 mg total) by mouth 2 (two) times daily. 10/18/16   Tower, Audrie GallusMarne A, MD  Cholecalciferol (VITAMIN D3) 2000 UNITS TABS Take 1 tablet by mouth daily with breakfast.     [provider]  ezetimibe (ZETIA) 10 MG tablet Take 1 tablet (10 mg total) by mouth daily. 12/10/16 12/10/17  Albertine GratesXu, Fang, MD  fluticasone (FLONASE) 50 MCG/ACT nasal spray PLACE 1 TO 2 SPRAYS IN EACH NOSTRIL DAILY. 02/27/16   Tower, Audrie GallusMarne A, MD  metoprolol tartrate (LOPRESSOR) 25 MG tablet Take 0.5 tablets (12.5 mg total) by mouth 2 (two) times daily. 12/10/16 12/10/17  Albertine GratesXu, Fang, MD  mometasone (ELOCON) 0.1 % cream Apply to affected area in small amount 3 times weekly as needed Patient taking differently: Apply 1 application topically daily as needed (for rash).  11/10/15   Tower, Audrie GallusMarne A, MD  omega-3 acid ethyl esters (LOVAZA) 1 g capsule Take 1 capsule (1 g total) by mouth daily. 12/10/16   Albertine GratesXu, Fang, MD  omeprazole (PRILOSEC) 20 MG capsule TAKE 1 CAPSULE BY MOUTH 2 TIMES DAILY. 06/21/16   Tower, Audrie GallusMarne A, MD  traMADol (ULTRAM) 50 MG tablet TAKE 1 TABLET BY MOUTH EVERY 8 HOURS AS NEEDED FOR SEVERE PAIN. 03/30/16   Judy Pimpleower, Marne A, MD    Medications: Prior to Admission:  (Not in a hospital admission)  Results for orders placed or performed during the hospital encounter of 12/10/16 (from the past 48 hour(s))  CBC     Status: Abnormal   Collection Time: 12/10/16  7:36 PM  Result Value Ref Range   WBC 11.1 (H) 4.0 - 10.5 K/uL   RBC 4.79 3.87 - 5.11 MIL/uL   Hemoglobin 14.2 12.0 - 15.0 g/dL   HCT 04.543.9 40.936.0 - 81.146.0 %   MCV 91.6 78.0 - 100.0 fL   MCH 29.6 26.0 - 34.0 pg   MCHC 32.3 30.0 - 36.0 g/dL   RDW 91.415.4 78.211.5 - 95.615.5 %   Platelets 290 150 - 400 K/uL  Differential     Status: Abnormal   Collection Time: 12/10/16  7:36 PM  Result Value Ref Range   Neutrophils Relative % 52 %   Neutro Abs 5.8 1.7 - 7.7 K/uL   Lymphocytes Relative 36 %   Lymphs Abs 4.0 0.7 - 4.0 K/uL   Monocytes Relative 10  %   Monocytes Absolute 1.1 (H) 0.1 - 1.0 K/uL   Eosinophils Relative 2 %   Eosinophils Absolute 0.2 0.0 - 0.7 K/uL   Basophils Relative 0 %   Basophils Absolute 0.0 0.0 - 0.1 K/uL  CBG monitoring, ED  Status: Abnormal   Collection Time: 12/10/16  8:01 PM  Result Value Ref Range   Glucose-Capillary 155 (H) 65 - 99 mg/dL   Comment 1 Notify RN    Comment 2 Call MD NNP PA CNM    Comment 3 Document in Chart     Mr Brain Wo Contrast  Result Date: 12/09/2016 CLINICAL DATA:  Episode of loss of consciousness.  Confusion. EXAM: MRI HEAD WITHOUT CONTRAST TECHNIQUE: Multiplanar, multiecho pulse sequences of the brain and surrounding structures were obtained without intravenous contrast. COMPARISON:  CT 12/08/2016 FINDINGS: Brain: Study suffers from some motion degradation. Diffusion imaging shows a punctate focus of acute infarction in the left posterior temporal/occipital junction region. No other acute infarction is seen. There chronic small-vessel ischemic changes throughout the pons. No focal cerebellar insult. Cerebral hemispheres show confluent chronic small vessel ischemic changes affecting the white matter. There are old infarctions in the basal gangliar regions. Mild small vessel change affects the thalami. No mass lesion, acute hemorrhage, hydrocephalus or extra-axial collection. Vascular: Major vessels at the base of the brain show flow. Skull and upper cervical spine: Negative Sinuses/Orbits: Clear/normal Other: none significant IMPRESSION: Background pattern of brain atrophy with extensive chronic small-vessel ischemic changes throughout the brain. 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 Punctate acute infarction in the left posterior temporal lobe/occipital lobe junction region. Electronically Signed   By: Paulina Fusi M.D.   On: 12/09/2016 12:14    ROS Blood pressure 137/73, pulse 89, temperature 97.8 F (36.6 C), temperature source Oral, resp. rate 18, height 5\' 3"  (1.6 m), weight 59 kg  (130 lb 1.1 oz), SpO2 99 %. Neurologic Examination:   Awake, alert, oriented to season, place, city, state, but not year or month.  She knows her birthday. She is fluent, and comprehension, naming, repetition are intact.  Face symmetric.  Tongue midline.  Strength 5/5 BUE and BLE. Coord- intact bil. Sensory - intact. Gait -deferred. No babinski.  No hoffman's.   Assessment/Plan:  She is not IV tPA candidate since presentation is not likely to stroke related.  Suspect she likely had complex partial seizure, including 2 days ago.  The underlying etiology is her dementia.    Recommend admission for repeat EEG.    Her Tramadol should be stopped as it lowers the seizure threshold.  I will start Vimpat 50 mg bid.    Weston Settle, MD 12/10/2016, 8:06 PM

## 2016-12-10 NOTE — ED Notes (Signed)
Report attempted x 1

## 2016-12-10 NOTE — NC FL2 (Signed)
[place] MEDICAID FL2 LEVEL OF CARE SCREENING TOOL     IDENTIFICATION  Patient Name: Amber Rollins Birthdate: 1931/05/10 Sex: female Admission Date (Current Location): 12/08/2016  [facility] and [place] Number:  Producer, television/film/video and Address:  [facility],  [address] [place], [place] [address]      Provider Number: [id number]  Attending Physician Name and Address:  Albertine Grates, MD  Relative Name and Phone Number:  [facility] Daughter [phone]     Current Level of Care: Hospital Recommended Level of Care: Assisted Living Facility-Alzheimer's Unit Prior Approval Number:    Date Approved/Denied:   PASRR Number:    Discharge Plan: Other (Comment) (ALF)    Current Diagnoses: Patient Active Problem List   Diagnosis Date Noted  . Syncope 12/08/2016  . Altered mental state 10/12/2016  . Foot pain, bilateral 08/23/2016  . Diarrhea 03/29/2016  . Hyponatremia 01/01/2016  . Routine general medical examination at a health care facility 11/10/2015  . Urinary frequency 10/23/2014  . Generalized weakness 10/23/2014  . Hammer toe 09/20/2014  . Herpetic dermatitis 08/09/2014  . Poor balance 04/11/2013  . Thoracic back pain 04/11/2013  . Urinary incontinence 12/27/2012  . Thoracic kyphosis 09/24/2012  . Fall 08/09/2012  . Hemorrhoid 12/24/2011  . Dysuria 12/24/2011  . Low back pain 12/24/2011  . Hypokalemia 10/06/2010  . HEMATURIA UNSPECIFIED 10/02/2009  . NIGHT SWEATS 10/02/2009  . GERD 06/18/2009  . INCONTINENCE, URGE 06/18/2009  . FATIGUE 03/12/2009  . HYPOKALEMIA 10/08/2008  . MENOPAUSAL SYNDROME 01/22/2008  . EDEMA 07/19/2007  . Anxiety and depression 06/21/2007  . FOOT PAIN, CHRONIC 03/06/2007  . ALLERGIC RHINITIS, SEASONAL 09/12/2006  . IRRITABLE BOWEL SYNDROME 09/12/2006  . FIBROCYSTIC BREAST DISEASE 09/12/2006  . OSTEOARTHRITIS 09/12/2006  . HEMORRHOIDS, HX OF 09/12/2006  . Essential hypertension 05/31/2000  . Disorder of bone and  cartilage 11/29/1999  . Hyperlipidemia 05/31/1992    Orientation RESPIRATION BLADDER Height & Weight     Self, Time, Place  Normal Incontinent Weight: 127 lb (57.6 kg) Height:  5' 3.5" (161.3 cm)  BEHAVIORAL SYMPTOMS/MOOD NEUROLOGICAL BOWEL NUTRITION STATUS      Continent Diet (Low Sodium Heart Healty )  AMBULATORY STATUS COMMUNICATION OF NEEDS Skin   Supervision Verbally Normal                       Personal Care Assistance Level of Assistance  Bathing, Feeding, Dressing Bathing Assistance: Limited assistance Feeding assistance: Independent Dressing Assistance: Limited assistance     Functional Limitations Info  Sight, Hearing, Speech Sight Info: Adequate Hearing Info: Adequate Speech Info: Adequate    SPECIAL CARE FACTORS FREQUENCY  PT (By licensed PT), OT (By licensed OT)     PT Frequency: Min 3X/week              Contractures Contractures Info: Not present    Additional Factors Info  Code Status Code Status Info: Fullcode             Current Medications (12/10/2016):  This is the current hospital active medication list Current Facility-Administered Medications  Medication Dose Route Frequency Provider Last Rate Last Dose  . aspirin chewable tablet 81 mg  81 mg Oral Daily Albertine Grates, MD   81 mg at 12/10/16 1026  . azelastine (ASTELIN) 0.1 % nasal spray 1 spray  1 spray Each Nare BID [name], MD   1 spray at 12/10/16 1026  . busPIRone (BUSPAR) tablet 15 mg  15 mg Oral  BID Alison Murrayevine, Alma M, MD   15 mg at 12/10/16 1026  . cholecalciferol (VITAMIN D) tablet 2,000 Units  2,000 Units Oral Daily Alison Murrayevine, Alma M, MD   2,000 Units at 12/10/16 1026  . fluticasone (FLONASE) 50 MCG/ACT nasal spray 1 spray  1 spray Each Nare Daily Alison Murrayevine, Alma M, MD   1 spray at 12/10/16 1026  . hydrALAZINE (APRESOLINE) injection 5 mg  5 mg Intravenous Q4H PRN Dorothea OgleMyers, Iskra M, MD   5 mg at 12/09/16 2202  . mometasone (ELOCON) 0.1 % cream 1 application  1 application Topical Daily  PRN Alison Murrayevine, Alma M, MD      . pantoprazole (PROTONIX) EC tablet 40 mg  40 mg Oral Daily Alison Murrayevine, Alma M, MD   40 mg at 12/10/16 1026  . sodium chloride flush (NS) 0.9 % injection 3 mL  3 mL Intravenous Q12H Alison Murrayevine, Alma M, MD   3 mL at 12/10/16 1026  . traMADol (ULTRAM) tablet 50 mg  50 mg Oral Q6H PRN Alison Murrayevine, Alma M, MD         Discharge Medications: Please see discharge summary for a list of discharge medications.  Relevant Imaging Results:  Relevant Lab Results:   Additional Information ssn:[phone]  Clearance CootsNicole A Sinclair, LCSW

## 2016-12-10 NOTE — Progress Notes (Signed)
3:36PM CSW faxed d/c summary to facility/ FL2   4:24PM CSW received call that pt. And daughter did not get there discharge packet w/ AVS and w/ scripts. Nurse reports she forgot to provide patient with packet.  Patient daughter reports will pick up packet later. Probably after 7:00pm.   Vivi BarrackNicole Livie Vanderhoof, Theresia MajorsLCSWA, MSW Clinical Social Worker 5E and Psychiatric Service Line [phone] 12/10/2016  5:10 PM

## 2016-12-10 NOTE — ED Triage Notes (Signed)
Pt from [facility] with ems. Per facility pt was walking independently, sat on couch and "passed out" when ems arrived pt was alert but disoriented x4. Upon arrival to ED to was alert and disorientedx4. BP 158/100 HR 80 95% on 2L [place]. CBG 151.

## 2016-12-10 NOTE — Progress Notes (Signed)
Responded to Code Stroke called at 1921.  Pt arrived via EMS at 1931 having had a syncopal episode in chair at facility([facility] as well and was d/c'd)  [id number], [id number]-[id number]. Per EMS pt had slurred speech and confusion upon arrival. Upon arrival to ED, patient very confused, answering questions inappropriately, unable to follow commands correctly. After CT, pt still very confused but answering questions she couldn't before and following most commands. NIH-3 for not knowing age or month and partial sensory loss.  Due to improvement of neuro status t/o arrival, code stroke cancelled.  Pt to be admitted to medical team for seizure workup.

## 2016-12-10 NOTE — Discharge Summary (Signed)
Discharge Summary  Amber Rollins ZOX:[id number] DOB: [birth year]  PCP: [name], MD  Admit date: 12/08/2016 Discharge date: 12/10/2016  Time spent: >76mins, more than 50% time spent on coordination of care  Recommendations for Outpatient Follow-up:  1. F/u with PMD within a week  for hospital discharge follow up, repeat cbc/bmp at follow up 2. F/u with neurology for cva, dementia eval, repeat eeg in 4-6 weeks 3. F/u with cardiology for outpatient cardiology monitoring to rule out arrhythmia  4. 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 Syncope 12/08/2016    Resolved Hospital Problems   Diagnosis Date Noted Date Resolved  No resolved problems to display.    Discharge Condition: stable  Diet recommendation: heart healthy  Filed Weights   12/08/16 1125  Weight: 57.6 kg (127 lb)    History of present illness:  PCP: [name], MD   Patient coming from: Home  Chief Complaint: syncope  HPI: Amber Rollins is a 83 y.o. female with hypertension and hyperlipidemia, not on any treatment for it, presented to emergency department after an episode of passing out at home. Please note that patient was somewhat confused on admission and daughter at bedside provided more information. Daughter explains that patient was sitting with family and talking, became suddenly unresponsive and passed out for several minutes. When patient woke up she was disoriented and not able to recall any details of the event. Patient denies any symptoms prior to the event, no chest pains or headaches, no shortness of breath or dizziness, no numbness or weakness, no changes in vision. Patient denies similar events in the past.  ED Course:  Patient hemodynamically stable, vital signs notable for initial blood pressure 156/60, repeat blood pressure 181/93, vital signs otherwise stable. Blood work  unremarkable except mild lower rise in creatinine 1.03. TR[facility] asked to admit for further evaluation  Hospital Course:  Active Problems:   Syncope   Per daughter "Pt was sitting in a chair and they were talking when the patient stared forward and began to blink and then her head slumped forward. She appeared to be breathing heavy and was unresponsive with her eyes open for about five minutes. When she awoke she was confused for another 20 minutes and had no recollection of the episode. " --Possible etiology including syncope, TIA, seizure, encephalopathy  --ua unremarkable, no sign of infection --MRI brain: "Background pattern of brain atrophy with extensive chronic small-vessel ischemic changes throughout the brain. 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 Punctate acute infarction in the left posterior temporal lobe/occipital lobe junction region."  -- EEG " Clinical Correlation of the above findings indicates diffuse cerebral dysfunction that is non-specific in etiology and can be seen with hypoxic/ischemic injury, toxic/metabolic encephalopathies, neurodegenerative disorders, or medication effect. Focal slowing over the left temporal region indicates focal cerebral dysfunction in this region suggestive of underlying structural or physiologic abnormality. The absence of epileptiform discharges does not rule out a clinical diagnosis of epilepsy.  Clinical correlation is advised." - neurology consulted who think the punctate acute infarct could be an artifact on the MRI brain, recommend start asa, recommend repeat eeg in 4-6 weeks and neurology follow up outpatient. -carotid US: Preliminary report:  Bilateral:  1-39% ICA stenosis.  Vertebral artery flow is antegrade -echo lvwf 65%, grade 1 diastolic dysfunction, no cardiac embous, tele unremarkable -lipid panel total cholesterol elevated at 376, ldl elevated at 300 -a1c  pending at time of discharge, no hyperglycemia, no prior h/o diabetes -patient  is discharged with meds as listed below, she is to follow up with neurology and to have outpatient cardiac monitoring to r/o arrhythmia   Confusion; family report patient has progressive confusion for the last few months, she is to follow up with neurology for dementia eval  Hypokalemia: replaced k  HTN; not on meds at home,  permissive hypertension while in the hospital sbp ranged from 140-180 She is discharged on low dose lopressor  HLD:   total cholesterol elevated at 376, ldl elevated at 300 She has h/o statin intolerance, she is started on zetia and fish oil, outpatient follow up with neurology for possible evolocumab?  Recurrent falls: PT eval, fall precaution, home health  FTT, She is from ALF, resume home health  Code Status: full  Family Communication: patient in room, daughter over the phone  Disposition Plan: return to ALF  with home health   Consultants:  neurology  Procedures:  EEG  Antibiotics:  none   Discharge Exam: BP (!) 162/86 (BP Location: Right Arm)   Pulse 89   Temp 98.1 F (36.7 C) (Oral)   Resp 18   Ht 5' 3.5" (1.613 m)   Wt 57.6 kg (127 lb)   SpO2 96%   BMI 22.14 kg/m     General:  Frail, NAD, intermittent confusion, not oriented to place at times, impaired memory, immediate recall 2/3, she did not remember why she is brought to the hospital  Cardiovascular: RRR  Respiratory: CTABL  Abdomen: Soft/ND/NT, positive BS  Musculoskeletal: No Edema  Neuro: aaox3, impaired memory, no focal deficit noticed   Discharge Instructions You were cared for by a hospitalist during your hospital stay. If you have any questions about your discharge medications or the care you received while you were in the hospital after you are discharged, you can call the unit and asked to speak with the hospitalist on call if the hospitalist that took care of you is not available. Once you are discharged, your primary care physician will handle any  further medical issues. Please note that NO REFILLS for any discharge medications will be authorized once you are discharged, as it is imperative that you return to your primary care physician (or establish a relationship with a primary care physician if you do not have one) for your aftercare needs so that they can reassess your need for medications and monitor your lab values.  Discharge Instructions    Ambulatory referral to Neurology    Complete by:  As directed    An appointment is requested in approximately: 4 weeks  For cva, dementia eval, need to repeat eeg in 4-6 weeks.   Diet - low sodium heart healthy    Complete by:  As directed    Low fat   Face-to-face encounter (required for Medicare/Medicaid patients)    Complete by:  As directed    I Mat Stuard certify that this patient is under my care and that I, or a nurse practitioner or physician's assistant working with me, had a face-to-face encounter that meets the physician face-to-face encounter requirements with this patient on 12/10/2016. The encounter with the patient was in whole, or in part for the following medical condition(s) which is the primary reason for home health care (List medical condition): falls, confusion   The encounter with the patient was in whole, or in part, for the following medical condition, which is the primary reason for home health  care:  FTT   I certify that, based on my findings, the following services are medically necessary home health services:   Nursing Physical therapy     Reason for Medically Necessary Home Health Services:  Skilled Nursing- Change/Decline in Patient Status   My clinical findings support the need for the above services:  Cognitive impairments, dementia, or mental confusion  that make it unsafe to leave home   Further, I certify that my clinical findings support that this patient is homebound due to:  Mental confusion   Home Health    Complete by:  As directed    To provide the  following care/treatments:   PT RN Social work     Increase activity slowly    Complete by:  As directed      Allergies as of 12/10/2016      Reactions   Tdap [diphth-acell Pertussis-tetanus] Swelling   Amlodipine Besylate    REACTION: edema   Codeine    headache   Hydrocodone    hallucinations   Lovastatin    REACTION: leg pain   Metronidazole Swelling   And body aches   Penicillins Itching   Unknown reaction; has tolerated cephalosporins Has patient had a PCN reaction causing immediate rash, facial/tongue/throat swelling, SOB or lightheadedness with hypotension:unsure Has patient had a PCN reaction causing severe rash involving mucus membranes or skin necrosis:unsure Has patient had a PCN reaction that required hospitalization:unsure Has patient had a PCN reaction occurring within the last 10 years:No If all of the above answers are "NO", then may proceed with Cephalosporin use.   Sertraline Hcl    REACTION: caused insomnia   Simvastatin    REACTION: non tolerant- leg pain   Latex Rash   Sulfonamide Derivatives Rash      Medication List    TAKE these medications   acetaminophen 325 MG tablet Commonly known as:  TYLENOL Take 650 mg by mouth every 4 (four) hours as needed.   aspirin 81 MG chewable tablet Chew 1 tablet (81 mg total) by mouth daily.   azelastine 0.1 % nasal spray Commonly known as:  ASTELIN PLACE 2 SPRAYS INTO BOTH NOSTRILS 2 TIMES DAILY.   busPIRone 15 MG tablet Commonly known as:  BUSPAR Take 1 tablet (15 mg total) by mouth 2 (two) times daily.   ezetimibe 10 MG tablet Commonly known as:  ZETIA Take 1 tablet (10 mg total) by mouth daily.   fluticasone 50 MCG/ACT nasal spray Commonly known as:  FLONASE PLACE 1 TO 2 SPRAYS IN EACH NOSTRIL DAILY.   metoprolol tartrate 25 MG tablet Commonly known as:  LOPRESSOR Take 0.5 tablets (12.5 mg total) by mouth 2 (two) times daily.   mometasone 0.1 % cream Commonly known as:  ELOCON Apply to  affected area in small amount 3 times weekly as needed What changed:  how much to take  how to take this  when to take this  reasons to take this  additional instructions   omega-3 acid ethyl esters 1 g capsule Commonly known as:  LOVAZA Take 1 capsule (1 g total) by mouth daily.   omeprazole 20 MG capsule Commonly known as:  PRILOSEC TAKE 1 CAPSULE BY MOUTH 2 TIMES DAILY.   traMADol 50 MG tablet Commonly known as:  ULTRAM TAKE 1 TABLET BY MOUTH EVERY 8 HOURS AS NEEDED FOR SEVERE PAIN.   Vitamin D3 2000 units Tabs Take 1 tablet by mouth daily with breakfast.      Allergies  Allergen  Reactions  . Tdap [Diphth-Acell Pertussis-Tetanus] Swelling  . Amlodipine Besylate     REACTION: edema  . Codeine     headache  . Hydrocodone     hallucinations  . Lovastatin     REACTION: leg pain  . Metronidazole Swelling    And body aches  . Penicillins Itching    Unknown reaction; has tolerated cephalosporins Has patient had a PCN reaction causing immediate rash, facial/tongue/throat swelling, SOB or lightheadedness with hypotension:unsure Has patient had a PCN reaction causing severe rash involving mucus membranes or skin necrosis:unsure Has patient had a PCN reaction that required hospitalization:unsure Has patient had a PCN reaction occurring within the last 10 years:No If all of the above answers are "NO", then may proceed with Cephalosporin use.   . Sertraline Hcl     REACTION: caused insomnia  . Simvastatin     REACTION: non tolerant- leg pain  . Latex Rash  . Sulfonamide Derivatives Rash   Follow-up Information    Tower, Audrie Gallus, MD Follow up in 1 week(s).   Specialties:  Family Medicine, Radiology Why:  hospital discharge follow up Contact information: [address] [place] [address] [phone]        podiatry follow up for left second toe deformity Follow up.        [facility] Follow up in 4 week(s).   Why:  for  dementia evaluation  repeat EEG testing in 4-6 weeks Contact information: [address]     [address] [place] [address] [phone]       [facility] Follow up.   Specialty:  Cardiology Why:  for outpatient cardiac monitoring  please call cardiology office, if you do not hear from them in three buisiness days. Contact information: [address] [place] [address] [phone]           The results of significant diagnostics from this hospitalization (including imaging, microbiology, ancillary and laboratory) are listed below for reference.    Significant Diagnostic Studies: Dg Chest 2 View  Result Date: 12/08/2016 CLINICAL DATA:  Syncope EXAM: CHEST  2 VIEW COMPARISON:  September 03, 2012 FINDINGS: There is no edema or consolidation. Heart size and pulmonary vascularity are normal. No adenopathy. There is aortic atherosclerosis. There is evidence of an old fracture of the inferior sternum with remodeling. There is also a prior fracture of the inferior manubrium with remodeling. Patient has had previous kyphoplasty procedures at T7, T8, T12. There is increase in kyphosis at the T7-T8 level. IMPRESSION: No edema or consolidation. Stable cardiac silhouette. There is aortic atherosclerosis. Evidence of prior fractures. Patient has undergone kyphoplasty procedures in the mid and lower thoracic regions. Aortic Atherosclerosis (ICD10-I70.0). Electronically Signed   By: Bretta Bang III M.D.   On: 12/08/2016 13:27   Ct Head Wo Contrast  Result Date: 12/08/2016 CLINICAL DATA:  Altered mental status with probable seizure EXAM: CT HEAD WITHOUT CONTRAST TECHNIQUE: Contiguous axial images were obtained from the base of the skull through the vertex without intravenous contrast. COMPARISON:  September 06, 2016 FINDINGS: Brain: Mild diffuse atrophy is stable. There is no intracranial mass, hemorrhage, extra-axial fluid collection,  or midline shift. There is small vessel disease throughout the centra semiovale bilaterally. There is extensive small vessel disease in the external capsules bilaterally, particularly anteriorly. A lesser degree of small vessel disease is noted in the anterior limb of each internal capsule. There is no evident new gray-white  compartment lesion. No acute infarct appreciable. Vascular: No hyperdense vessel. There is calcification in each carotid siphon. Skull: Bony calvarium appears intact. There is a small left frontal exostosis, benign and stable. Sinuses/Orbits: There is mucosal thickening in several ethmoid air cells bilaterally. Other visualized paranasal sinuses are clear. Orbits appear symmetric bilaterally. Other: Mastoid air cells are clear. There is degenerative change in the temporomandibular joints bilaterally. IMPRESSION: Atrophy with extensive supratentorial small vessel disease, stable. No intracranial mass, hemorrhage, or extra-axial fluid collection. No evident acute infarct. Areas of arterial vascular calcification bilaterally. 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 There is mucosal thickening in several ethmoid air cells. Electronically Signed   By: Bretta Bang III M.D.   On: 12/08/2016 15:37   Mr Brain Wo Contrast  Result Date: 12/09/2016 CLINICAL DATA:  Episode of loss of consciousness.  Confusion. EXAM: MRI HEAD WITHOUT CONTRAST TECHNIQUE: Multiplanar, multiecho pulse sequences of the brain and surrounding structures were obtained without intravenous contrast. COMPARISON:  CT 12/08/2016 FINDINGS: Brain: Study suffers from some motion degradation. Diffusion imaging shows a punctate focus of acute infarction in the left posterior temporal/occipital junction region. No other acute infarction is seen. There chronic small-vessel ischemic changes throughout the pons. No focal cerebellar insult. Cerebral hemispheres show confluent chronic small vessel ischemic changes  affecting the white matter. There are old infarctions in the basal gangliar regions. Mild small vessel change affects the thalami. No mass lesion, acute hemorrhage, hydrocephalus or extra-axial collection. Vascular: Major vessels at the base of the brain show flow. Skull and upper cervical spine: Negative Sinuses/Orbits: Clear/normal Other: none significant IMPRESSION: Background pattern of brain atrophy with extensive chronic small-vessel ischemic changes throughout the brain. 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 Punctate acute infarction in the left posterior temporal lobe/occipital lobe junction region. Electronically Signed   By: Paulina Fusi M.D.   On: 12/09/2016 12:14    Microbiology: Recent Results (from the past 240 hour(s))  MRSA PCR Screening     Status: None   Collection Time: 12/08/16  8:58 PM  Result Value Ref Range Status   MRSA by PCR NEGATIVE NEGATIVE Final    Comment:        The GeneXpert MRSA Assay (FDA approved for NASAL specimens only), is one component of a comprehensive MRSA colonization surveillance program. It is not intended to diagnose MRSA infection nor to guide or monitor treatment for MRSA infections.      Labs: Basic Metabolic Panel:  Recent Labs Lab 12/08/16 1132 12/09/16 0522 12/10/16 0554  NA 143 144 144  K 3.5 3.3* 3.8  CL 105 106 107  CO2 30 30 28   GLUCOSE 150* 98 104*  BUN 19 15 15   CREATININE 1.03* 0.94 0.94  CALCIUM 9.6 9.4 9.6  MG  --  1.8 1.9   Liver Function Tests: No results for input(s): AST, ALT, ALKPHOS, BILITOT, PROT, ALBUMIN in the last 168 hours. No results for input(s): LIPASE, AMYLASE in the last 168 hours. No results for input(s): AMMONIA in the last 168 hours. CBC:  Recent Labs Lab 12/08/16 1132 12/09/16 0522  WBC 8.9 8.0  HGB 13.2 13.3  HCT 40.2 40.7  MCV 91.6 90.6  PLT 270 298   Cardiac Enzymes: No results for input(s): CKTOTAL, CKMB, CKMBINDEX, TROPONINI in the last 168 hours. BNP: BNP (last 3  results) No results for input(s): BNP in the last 8760 hours.  ProBNP (last 3 results) No results for input(s): PROBNP in the last 8760 hours.  CBG:  Recent Labs Lab  12/08/16 1137 12/10/16 0558  GLUCAP 118* 113*       SignedAlbertine Grates:  Effie Janoski MD, PhD  [facility] 12/10/2016, 11:20 AM

## 2016-12-10 NOTE — Progress Notes (Signed)
Date: December 10, 2016 Pt through [facility].  Company representative aware and accepted referral. Clide DalesRhonda Davis,BSN,RN3, [phone]

## 2016-12-11 ENCOUNTER — Observation Stay (HOSPITAL_COMMUNITY): Payer: PPO

## 2016-12-11 ENCOUNTER — Encounter (HOSPITAL_COMMUNITY): Payer: Self-pay | Admitting: Internal Medicine

## 2016-12-11 DIAGNOSIS — R569 Unspecified convulsions: Secondary | ICD-10-CM

## 2016-12-11 DIAGNOSIS — G40209 Localization-related (focal) (partial) symptomatic epilepsy and epileptic syndromes with complex partial seizures, not intractable, without status epilepticus: Secondary | ICD-10-CM | POA: Diagnosis not present

## 2016-12-11 DIAGNOSIS — F0390 Unspecified dementia without behavioral disturbance: Secondary | ICD-10-CM | POA: Diagnosis present

## 2016-12-11 LAB — BASIC METABOLIC PANEL
Anion gap: 9 (ref 5–15)
BUN: 22 mg/dL — AB (ref 6–20)
CHLORIDE: 100 mmol/L — AB (ref 101–111)
CO2: 25 mmol/L (ref 22–32)
CREATININE: 1.15 mg/dL — AB (ref 0.44–1.00)
Calcium: 9.1 mg/dL (ref 8.9–10.3)
GFR calc Af Amer: 50 mL/min — ABNORMAL LOW (ref >60)
GFR calc non Af Amer: 43 mL/min — ABNORMAL LOW (ref >60)
Glucose, Bld: 103 mg/dL — ABNORMAL HIGH (ref 65–99)
Potassium: 3.8 mmol/L (ref 3.5–5.1)
SODIUM: 134 mmol/L — AB (ref 135–145)

## 2016-12-11 LAB — CBC
HCT: 41.3 % (ref 36.0–46.0)
Hemoglobin: 13.5 g/dL (ref 12.0–15.0)
MCH: 29.3 pg (ref 26.0–34.0)
MCHC: 32.7 g/dL (ref 30.0–36.0)
MCV: 89.8 fL (ref 78.0–100.0)
PLATELETS: 298 10*3/uL (ref 150–400)
RBC: 4.6 MIL/uL (ref 3.87–5.11)
RDW: 15.4 % (ref 11.5–15.5)
WBC: 8.8 10*3/uL (ref 4.0–10.5)

## 2016-12-11 LAB — HEMOGLOBIN A1C
HEMOGLOBIN A1C: 5.8 % — AB (ref 4.8–5.6)
MEAN PLASMA GLUCOSE: 120 mg/dL

## 2016-12-11 MED ORDER — BUSPIRONE HCL 10 MG PO TABS
15.0000 mg | ORAL_TABLET | Freq: Two times a day (BID) | ORAL | Status: DC
  Administered 2016-12-11 – 2016-12-13 (×5): 15 mg via ORAL
  Filled 2016-12-11 (×6): qty 2

## 2016-12-11 MED ORDER — ACETAMINOPHEN 650 MG RE SUPP: 650.0000 mg | Freq: Four times a day (QID) | RECTAL | Status: DC | PRN

## 2016-12-11 MED ORDER — ASPIRIN 81 MG PO CHEW
81.0000 mg | CHEWABLE_TABLET | Freq: Every day | ORAL | Status: DC
  Administered 2016-12-11 – 2016-12-13 (×3): 81 mg via ORAL
  Filled 2016-12-11 (×3): qty 1

## 2016-12-11 MED ORDER — SODIUM CHLORIDE 0.9 % IV SOLN
INTRAVENOUS | Status: DC
  Administered 2016-12-11: 02:00:00 via INTRAVENOUS

## 2016-12-11 MED ORDER — PANTOPRAZOLE SODIUM 40 MG PO TBEC
40.0000 mg | DELAYED_RELEASE_TABLET | Freq: Every day | ORAL | Status: DC
  Administered 2016-12-11 – 2016-12-13 (×3): 40 mg via ORAL
  Filled 2016-12-11 (×3): qty 1

## 2016-12-11 MED ORDER — ENOXAPARIN SODIUM 30 MG/0.3ML ~~LOC~~ SOLN
30.0000 mg | SUBCUTANEOUS | Status: DC
  Administered 2016-12-11 – 2016-12-13 (×3): 30 mg via SUBCUTANEOUS
  Filled 2016-12-11 (×3): qty 0.3

## 2016-12-11 MED ORDER — EZETIMIBE 10 MG PO TABS
10.0000 mg | ORAL_TABLET | Freq: Every day | ORAL | Status: DC
  Administered 2016-12-11 – 2016-12-12 (×2): 10 mg via ORAL
  Filled 2016-12-11 (×2): qty 1

## 2016-12-11 MED ORDER — FLUTICASONE PROPIONATE 50 MCG/ACT NA SUSP
2.0000 | Freq: Every day | NASAL | Status: DC
  Administered 2016-12-11 – 2016-12-13 (×3): 2 via NASAL
  Filled 2016-12-11: qty 16

## 2016-12-11 MED ORDER — OMEGA-3-ACID ETHYL ESTERS 1 G PO CAPS
1.0000 g | ORAL_CAPSULE | Freq: Every day | ORAL | Status: DC
  Administered 2016-12-11 – 2016-12-12 (×2): 1 g via ORAL
  Filled 2016-12-11 (×2): qty 1

## 2016-12-11 MED ORDER — ONDANSETRON HCL 4 MG PO TABS: 4.0000 mg | ORAL_TABLET | Freq: Four times a day (QID) | ORAL | Status: DC | PRN

## 2016-12-11 MED ORDER — ACETAMINOPHEN 325 MG PO TABS
650.0000 mg | ORAL_TABLET | Freq: Four times a day (QID) | ORAL | Status: DC | PRN
  Administered 2016-12-11: 650 mg via ORAL
  Filled 2016-12-11: qty 2

## 2016-12-11 MED ORDER — METOPROLOL TARTRATE 25 MG PO TABS
25.0000 mg | ORAL_TABLET | Freq: Two times a day (BID) | ORAL | Status: DC
  Administered 2016-12-11 – 2016-12-13 (×5): 25 mg via ORAL
  Filled 2016-12-11 (×5): qty 1

## 2016-12-11 MED ORDER — ONDANSETRON HCL 4 MG/2ML IJ SOLN
4.0000 mg | Freq: Four times a day (QID) | INTRAMUSCULAR | Status: DC | PRN
Start: 2016-12-11 — End: 2016-12-13

## 2016-12-11 NOTE — Procedures (Signed)
  ELECTROENCEPHALOGRAM REPORT  Date of Study:  12/11/16  Patient's Name: Amber Rollins MRN:  [id number] Date of Birth:  06/13/1930  Referring Provider:  Weston SettleEshraghi, Shervin  Clinical History:  83 YO F with syncope.  CT Brain negative for acute changes at the time.  MRI Brain showed extensive atrophy, periventricular white matter disease, old bilateral BG infarcts, and a tiny inconsequential posterior temporal infarct. EEG had shown generalized slowing, more prominent in left temporal.  No epileptiform changes.  Tech notes some mild confusion.  Study duration was 22:10.  Medications:  Scheduled Meds: . aspirin  81 mg Oral Daily  . busPIRone  15 mg Oral BID  . enoxaparin (LOVENOX) injection  30 mg Subcutaneous Q24H  . ezetimibe  10 mg Oral Daily  . fluticasone  2 spray Each Nare Daily  . metoprolol tartrate  25 mg Oral BID  . omega-3 acid ethyl esters  1 g Oral Daily  . pantoprazole  40 mg Oral Daily   Continuous Infusions: . lacosamide (VIMPAT) IV Stopped (12/11/16 1045)        Technical Summary: A multichannel digital EEG recording measured by the international 10-20 system with electrodes applied with paste and impedances below 5000 ohms performed as portable with EKG monitoring in a predominantly awake and drowsy patient.  Hyperventilation and photic stimulation were not performed.  The digital EEG was referentially recorded, reformatted, and digitally filtered in a variety of bipolar and referential montages for optimal display.   Description: The patient is predominantly awake and drowsy during the recording.  In the awake state there is a 6-7 Hz theta rhythm seen from the posterior head regions in a symmetric fashion.  Drowsiness is noted by vertex slowing, however no stage II sleep is seen.  No HV or photic stimulation.  EKG was monitored and noted to be sinus rhythm with a heart rate of 72 bpm.  No epileptiform changes were noted.  Impression: This is an abnormal  EEG due to mild background slowing.   This is a very non-specific finding that can be seen with toxic, metabolic, diffuse, or multifocal structural processes.  No definite epileptiform changes were noted.   A single unremarkable EEG does not exclude the diagnosis of epilepsy.   Clinical correlation advised.   Beryle Beamsobert Tremond Shimabukuro, M.D.

## 2016-12-11 NOTE — Evaluation (Signed)
Patient admitted early this morning. Working diagnosis is complex partial seizures. An EEG has been ordered. I discussed her case with neurology. She has no new complaints. He has been started on antiseizure medication.

## 2016-12-11 NOTE — Progress Notes (Signed)
Pt admitted from ED with stroke like symptoms and AMS, pt alert to self but disoriented to place, follows command accordingly, pt settled in bed with call light within pt's reach, tele monitor put and verified on pt, was however reassured and will continue to monitor. Obasogie-Asidi, Kenzie Thoreson Efe

## 2016-12-11 NOTE — Progress Notes (Addendum)
Pt refused all her night time oral medications, very aggressive, combative and confused, refused to be touched, attempted to call her daughter by phone but no answer, however message dropped on her voicemail, will continue to monitor. Obasogie-Asidi, Shailene Demonbreun Efe  Pt's daughter later called back at 2300, and convinced pt to take her medications at 2320. Obasogie-Asidi, Shaya Reddick Efe

## 2016-12-11 NOTE — H&P (Signed)
History and Physical    Amber Rollins ZOX:[id number] DOB: 1931-01-04 DOA: 12/10/2016  PCP: [name], MD  Patient coming from: Nursing home.  Chief Complaint: Loss of consciousness.  History obtained from patient's daughter.  HPI: Amber Rollins is a 83 y.o. female with history of dementia, hypertension, hyperlipidemia who was discharged to nursing home last evening around 3 PM was walking with the help of a walker when patient sat down and had sudden syncopal episode as noticed by the patient's nurse. Patient had a similar episode 3 days ago when patient was admitted. At the time the episode was witnessed by patient's daughter and had noticed that patient had flickering of the eyes for a few minutes for which patient loss consciousness and was confused. Did not have any incontinence of urine. During the last admission patient had stroke workup including MRI carotid Doppler 2-D echo and also had EEG. MRI was showing small lacunar infarct.   ED Course: In the ER CT head is unremarkable. Neurologist on-call was consulted and patient was started on Vimpat given the history suggesting complex partial seizures. As per the family patient still not back to baseline.  Review of Systems: As per HPI, rest all negative.   Past Medical History:  Diagnosis Date  . Chronic foot pain    mortons neuroma  . Complication of anesthesia   . Cystocele 01/01   neg. sx  . fracture fibula ? 2003   left  . GERD (gastroesophageal reflux disease)   . Hemorrhoids   . Hyperlipemia 05/31/1992  . Hypertension 05/31/2000  . Menopausal symptoms    vasomotor symptoms-severe  . Osteoarthritis   . Osteopenia 11/29/1999  . Pedal edema    worsened by Norvasc  . Plantar fasciitis   . PONV (postoperative nausea and vomiting)     Past Surgical History:  Procedure Laterality Date  . Anterior repari w//surg proc  06/30/99   RSO  . BLADDER SUSPENSION    . BLADDER TACK    . CATARACTS    .  Foot problems     neuromas  . history of CT     mass right adnexa, U?S by GYN-observation  . KYPHOPLASTY N/A 09/04/2012   Procedure: T7, T8,T12 Kyphoplasty ;  Surgeon: Clydene Fake, MD;  Location: [facility];  Service: Neurosurgery;  Laterality: N/A;  thoracic seven,eight, and twelve   . PARTIAL HYSTERECTOMY  1970   endometriosis     reports that she has never smoked. She has never used smokeless tobacco. She reports that she does not drink alcohol or use drugs.  Allergies  Allergen Reactions  . Tdap [Diphth-Acell Pertussis-Tetanus] Swelling    Unknown site of swelling  . Amlodipine Besylate Other (See Comments)    Edema  . Codeine Other (See Comments)    Headache   . Hydrocodone Other (See Comments)    Hallucinations   . Lovastatin Other (See Comments)    Leg pain  . Metronidazole Swelling and Other (See Comments)    Body aches, also  . Oxycodone Other (See Comments)    Made the patient "loopy"  . Penicillins Itching    Allergy from childhood Has patient had a PCN reaction causing immediate rash, facial/tongue/throat swelling, SOB or lightheadedness with hypotension: Yes Has patient had a PCN reaction causing severe rash involving mucus membranes or skin necrosis: Unknown Has patient had a PCN reaction that required hospitalization: Unknown Has patient had a PCN reaction occurring within the last 10 years:  No If all of the above answers are "NO", then may proceed with Cephalosporin use.   . Sertraline Hcl Other (See Comments)    Insomnia   . Simvastatin Other (See Comments)    Cannot tolerate/ leg pain  . Tape Other (See Comments)    PLEASE USE COBAN WRAP; PATIENT'S SKIN IS VERY THIN AND IT TEARS EASILY!!  . Latex Rash  . Sulfonamide Derivatives Rash    Family History  Problem Relation Age of Onset  . Diabetes Brother   . Hypertension Other   . Diabetes Mother   . Heart disease Mother        pacemaker  . Hypertension Mother   . COPD Father   . Diabetes  Sister   . Mental illness Son        anxiety-mental health problems  . Diabetes Brother   . Diabetes Sister   . Diabetes Sister     Prior to Admission medications   Medication Sig Start Date End Date Taking? Authorizing Provider  acetaminophen (TYLENOL) 325 MG tablet Take 650 mg by mouth every 4 (four) hours as needed (for pain).    Yes [provider]  aspirin 81 MG chewable tablet Chew 1 tablet (81 mg total) by mouth daily. 12/11/16  Yes Albertine Grates, MD  azelastine (ASTELIN) 0.1 % nasal spray PLACE 2 SPRAYS INTO BOTH NOSTRILS 2 TIMES DAILY. 02/27/16  Yes Tower, Audrie Gallus, MD  busPIRone (BUSPAR) 15 MG tablet Take 1 tablet (15 mg total) by mouth 2 (two) times daily. 10/18/16  Yes Tower, Audrie Gallus, MD  Cholecalciferol (VITAMIN D3) 2000 UNITS TABS Take 1 tablet by mouth daily with breakfast.    Yes [provider]  ezetimibe (ZETIA) 10 MG tablet Take 1 tablet (10 mg total) by mouth daily. 12/10/16 12/10/17 Yes Albertine Grates, MD  fluticasone (FLONASE) 50 MCG/ACT nasal spray PLACE 1 TO 2 SPRAYS IN EACH NOSTRIL DAILY. Patient taking differently: Instill 1-2 sprays into each nostril once a day 02/27/16  Yes Tower, Audrie Gallus, MD  metoprolol tartrate (LOPRESSOR) 25 MG tablet Take 0.5 tablets (12.5 mg total) by mouth 2 (two) times daily. Patient taking differently: Take 25 mg by mouth 2 (two) times daily.  12/10/16 12/10/17 Yes Albertine Grates, MD  mometasone (ELOCON) 0.1 % cream Apply to affected area in small amount 3 times weekly as needed Patient taking differently: Apply 1 application topically See admin instructions. Three times a week to affected area(s) as needed 11/10/15  Yes Tower, Audrie Gallus, MD  omega-3 acid ethyl esters (LOVAZA) 1 g capsule Take 1 capsule (1 g total) by mouth daily. 12/10/16  Yes Albertine Grates, MD  omeprazole (PRILOSEC) 20 MG capsule TAKE 1 CAPSULE BY MOUTH 2 TIMES DAILY. 06/21/16  Yes Tower, Audrie Gallus, MD  traMADol (ULTRAM) 50 MG tablet TAKE 1 TABLET BY MOUTH EVERY 8 HOURS AS NEEDED FOR SEVERE  PAIN. 03/30/16  Yes [name], MD    Physical Exam: Vitals:   12/10/16 2230 12/10/16 2300 12/11/16 0000 12/11/16 0040  BP: (!) 175/81 (!) 157/80 (!) 183/86 (!) 156/93  Pulse: 79  86 81  Resp: 20 18 16 16   Temp:    98.2 F (36.8 C)  TempSrc:    Oral  SpO2: 97%  97% 95%  Weight:    61.6 kg (135 lb 11.2 oz)  Height:    5\' 3"  (1.6 m)      Constitutional: Moderately built and nourished. Vitals:   12/10/16 2230 12/10/16 2300 12/11/16 0000  12/11/16 0040  BP: (!) 175/81 (!) 157/80 (!) 183/86 (!) 156/93  Pulse: 79  86 81  Resp: 20 18 16 16   Temp:    98.2 F (36.8 C)  TempSrc:    Oral  SpO2: 97%  97% 95%  Weight:    61.6 kg (135 lb 11.2 oz)  Height:    5\' 3"  (1.6 m)   Eyes: Anicteric no pallor. ENMT: No discharge from the ears eyes nose and mouth. Neck: No mass felt. No JVD appreciated. Respiratory: No rhonchi or crepitations. Cardiovascular: S1 and S2 heard no murmurs appreciated. Abdomen: Soft nontender bowel sounds present. Musculoskeletal: No edema. No joint effusion. Skin: No rash. Skin appears warm. Neurologic: Alert awake oriented to name and place. Moves all extremities. Psychiatric: Has dementia but oriented to name and place.   Labs on Admission: I have personally reviewed following labs and imaging studies  CBC:  Recent Labs Lab 12/08/16 1132 12/09/16 0522 12/10/16 1936  WBC 8.9 8.0 11.1*  NEUTROABS  --   --  5.8  HGB 13.2 13.3 14.2  HCT 40.2 40.7 43.9  MCV 91.6 90.6 91.6  PLT 270 298 290   Basic Metabolic Panel:  Recent Labs Lab 12/08/16 1132 12/09/16 0522 12/10/16 0554 12/10/16 1936  NA 143 144 144 138  K 3.5 3.3* 3.8 4.2  CL 105 106 107 101  CO2 30 30 28 22   GLUCOSE 150* 98 104* 123*  BUN 19 15 15  24*  CREATININE 1.03* 0.94 0.94 1.86*  CALCIUM 9.6 9.4 9.6 9.6  MG  --  1.8 1.9  --    GFR: Estimated Creatinine Clearance: 19 mL/min (A) (by C-G formula based on SCr of 1.86 mg/dL (H)). Liver Function Tests:  Recent Labs Lab  12/10/16 1936  AST 23  ALT 14  ALKPHOS 66  BILITOT 0.5  PROT 6.5  ALBUMIN 3.9   No results for input(s): LIPASE, AMYLASE in the last 168 hours. No results for input(s): AMMONIA in the last 168 hours. Coagulation Profile:  Recent Labs Lab 12/10/16 1936  INR 1.01   Cardiac Enzymes: No results for input(s): CKTOTAL, CKMB, CKMBINDEX, TROPONINI in the last 168 hours. BNP (last 3 results) No results for input(s): PROBNP in the last 8760 hours. HbA1C: No results for input(s): HGBA1C in the last 72 hours. CBG:  Recent Labs Lab 12/08/16 1137 12/10/16 0558 12/10/16 2001  GLUCAP 118* 113* 155*   Lipid Profile:  Recent Labs  12/10/16 0554  CHOL 376*  HDL 55  LDLCALC 300*  TRIG 104  CHOLHDL 6.8   Thyroid Function Tests:  Recent Labs  12/09/16 0522  TSH 2.803   Anemia Panel: No results for input(s): VITAMINB12, FOLATE, FERRITIN, TIBC, IRON, RETICCTPCT in the last 72 hours. Urine analysis:    Component Value Date/Time   COLORURINE STRAW (A) 12/08/2016 1127   APPEARANCEUR CLEAR 12/08/2016 1127   LABSPEC 1.006 12/08/2016 1127   PHURINE 8.0 12/08/2016 1127   GLUCOSEU NEGATIVE 12/08/2016 1127   HGBUR NEGATIVE 12/08/2016 1127   HGBUR small 10/02/2009 0840   BILIRUBINUR NEGATIVE 12/08/2016 1127   BILIRUBINUR negative 10/12/2016 1418   KETONESUR NEGATIVE 12/08/2016 1127   PROTEINUR NEGATIVE 12/08/2016 1127   UROBILINOGEN 0.2 10/12/2016 1418   UROBILINOGEN 0.2 08/30/2012 2259   NITRITE NEGATIVE 12/08/2016 1127   LEUKOCYTESUR NEGATIVE 12/08/2016 1127   Sepsis Labs: @LABRCNTIP (procalcitonin:4,lacticidven:4) ) Recent Results (from the past 240 hour(s))  MRSA PCR Screening     Status: None   Collection Time: 12/08/16  8:58  PM  Result Value Ref Range Status   MRSA by PCR NEGATIVE NEGATIVE Final    Comment:        The GeneXpert MRSA Assay (FDA approved for NASAL specimens only), is one component of a comprehensive MRSA colonization surveillance program. It is  not intended to diagnose MRSA infection nor to guide or monitor treatment for MRSA infections.      Radiological Exams on Admission: Mr Brain Wo Contrast  Result Date: 12/09/2016 CLINICAL DATA:  Episode of loss of consciousness.  Confusion. EXAM: MRI HEAD WITHOUT CONTRAST TECHNIQUE: Multiplanar, multiecho pulse sequences of the brain and surrounding structures were obtained without intravenous contrast. COMPARISON:  CT 12/08/2016 FINDINGS: Brain: Study suffers from some motion degradation. Diffusion imaging shows a punctate focus of acute infarction in the left posterior temporal/occipital junction region. No other acute infarction is seen. There chronic small-vessel ischemic changes throughout the pons. No focal cerebellar insult. Cerebral hemispheres show confluent chronic small vessel ischemic changes affecting the white matter. There are old infarctions in the basal gangliar regions. Mild small vessel change affects the thalami. No mass lesion, acute hemorrhage, hydrocephalus or extra-axial collection. Vascular: Major vessels at the base of the brain show flow. Skull and upper cervical spine: Negative Sinuses/Orbits: Clear/normal Other: none significant IMPRESSION: Background pattern of brain atrophy with extensive chronic small-vessel ischemic changes throughout the brain. 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 Punctate acute infarction in the left posterior temporal lobe/occipital lobe junction region. Electronically Signed   By: Paulina Fusi M.D.   On: 12/09/2016 12:14   Ct Head Code Stroke W/o Cm  Result Date: 12/10/2016 CLINICAL DATA:  Code stroke. Rule out stroke. Altered mental status. EXAM: CT HEAD WITHOUT CONTRAST TECHNIQUE: Contiguous axial images were obtained from the base of the skull through the vertex without intravenous contrast. COMPARISON:  12/08/2016 CT head.  12/09/2016 MRI head. FINDINGS: Brain: No evidence of acute infarction, hemorrhage, hydrocephalus, extra-axial  collection or mass lesion/mass effect. Stable chronic infarcts within the bilateral anterior basal ganglia, advanced chronic microvascular ischemic changes of the brain, and moderate parenchymal volume loss. Vascular: No hyperdense vessel identified. Calcific atherosclerosis of carotid siphons and vertebral arteries. Skull: Normal. Negative for fracture or focal lesion. Sinuses/Orbits: No acute finding. Other: None. ASPECTS [facility] Stroke Program Early CT Score) - Ganglionic level infarction (caudate, lentiform nuclei, internal capsule, insula, M1-M3 cortex): 7 - Supraganglionic infarction (M4-M6 cortex): 3 Total score (0-10 with 10 being normal): 10 IMPRESSION: 1. No acute intracranial abnormality identified. 2. ASPECTS is 10 3. Stable advanced chronic microvascular ischemic changes of white matter, moderate parenchymal volume loss of the brain, and chronic lacunar infarcts within the anterior basal ganglia bilaterally. These results were called by telephone at the time of interpretation on 12/10/2016 at 8:04 pm to Dr. Gracy Racer, who verbally acknowledged these results. Electronically Signed   By: Mitzi Hansen M.D.   On: 12/10/2016 20:05     Assessment/Plan Principal Problem:   Complex partial seizure [facility]) Active Problems:   Essential hypertension   Syncope   Dementia   Seizure (HCC)    1. Complex partial seizures - appreciate neurology consult. At this time patient has been placed on Vimpat. Check EEG. Probably patient will need to be discharged on Vimpat. Discontinue tramadol. 2. Hypertension recently started on metoprolol. 3. Hyperlipidemia on Zetia. 4. Dementia not on any medications.   DVT prophylaxis: Lovenox. Code Status: DO NOT RESUSCITATE.  Family Communication: Patient's daughter and sister.  Disposition Plan: Back to nursing facility.  Consults called: Neurology.  Admission status: Observation.    Eduard Clos MD [facility] Pager [phone].  If 7PM-7AM, please contact night-coverage [email or web address] Password [email or web address]  12/11/2016, 1:29 AM

## 2016-12-11 NOTE — Progress Notes (Signed)
Bedside EEG completed, results pending. 

## 2016-12-11 NOTE — Progress Notes (Addendum)
NEUROLOGY FOLLOW UP  Subjective: No new complaints. Seen and examined.  Exam: Vitals:   12/11/16 0040 12/11/16 0551  BP: (!) 156/93 (!) 144/60  Pulse: 81 82  Resp: 16 16  Temp: 98.2 F (36.8 C) 98 F (36.7 C)   Gen. exam: Awake alert, in no acute distress, oriented to self and place. HEENT: Normocephalic atraumatic, dry mucous membranes, no lymphadenopathy no thyromegaly. Lungs clear to auscultation CVS: S1-S2 regular rate and rhythm no murmur or gallop Abdomen nondistended nontender, bowel sounds present Extremities warm and well-perfused intact peripheral pulses.  Neurological exam Alert awake oriented to place and person, month and year. Was not able to name the president. Naming, repetition and comprehension is intact. Cranial nerve exam: Pupils equal round reactive to light, extraocular movements intact, visual fields full, no facial asymmetry, facial sensation intact, , uvula midline, tongue midline,. Motor exam: Symmetric 4/5 antigravity strength all over with normal tone and bulk. Sensation intact to light touch bilaterally with no extinction. Coordination-intact finger-nose-finger bilaterally. Gait testing was deferred at this time Deep tendon reflexes 2+ all over with downgoing toes bilaterally.  Pertinent Labs: Na 134 BUN 22, Cr 1.15 GFR 43 WBC 11.1 Hgb 14.2 UA negative on 7/11 Prior EEG had generalized slowing and rarely more on left temporal region No new MRI MRI from prior admission to [facility] MRI examination of the brain-IMPRESSION: Background pattern of brain atrophy with extensive chronic small-vessel ischemic changes throughout the brain. 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 Punctate acute infarction in the left posterior temporal lobe/occipital lobe junction region.  Although the official report says there is a punctate acute infarcts in the left posterior temporal lobe/occipital lobe junction region I have reviewed the images and it does look like  that might be artifactual or does not have a clear ADC  correlate.     Current Facility-Administered Medications:  .  0.9 %  sodium chloride infusion, , Intravenous, Continuous, Eduard Clos, MD, Last Rate: 50 mL/hr at 12/11/16 0151 .  acetaminophen (TYLENOL) tablet 650 mg, 650 mg, Oral, Q6H PRN **OR** acetaminophen (TYLENOL) suppository 650 mg, 650 mg, Rectal, Q6H PRN, Eduard Clos, MD .  aspirin chewable tablet 81 mg, 81 mg, Oral, Daily, Eduard Clos, MD .  busPIRone (BUSPAR) tablet 15 mg, 15 mg, Oral, BID, Eduard Clos, MD .  enoxaparin (LOVENOX) injection 30 mg, 30 mg, Subcutaneous, Q24H, Eduard Clos, MD .  ezetimibe (ZETIA) tablet 10 mg, 10 mg, Oral, Daily, Eduard Clos, MD .  fluticasone (FLONASE) 50 MCG/ACT nasal spray 2 spray, 2 spray, Each Nare, Daily, Eduard Clos, MD .  lacosamide (VIMPAT) 50 mg in sodium chloride 0.9 % 25 mL IVPB, 50 mg, Intravenous, Q12H, Weston Settle, MD, Stopped at 12/10/16 2141 .  metoprolol tartrate (LOPRESSOR) tablet 25 mg, 25 mg, Oral, BID, Eduard Clos, MD .  omega-3 acid ethyl esters (LOVAZA) capsule 1 g, 1 g, Oral, Daily, Eduard Clos, MD .  ondansetron [facility]) tablet 4 mg, 4 mg, Oral, Q6H PRN **OR** ondansetron (ZOFRAN) injection 4 mg, 4 mg, Intravenous, Q6H PRN, Eduard Clos, MD .  pantoprazole (PROTONIX) EC tablet 40 mg, 40 mg, Oral, Daily, Eduard Clos, MD  Assessment:  Possible seizure, given multiplicity of similar episodes and gradual improvement in exam following the episodes. Concern for stroke is low. Seizure is higher on differential as her EEG is mildly abnormal. Imaging is only consistent with small vessel chronic white matter disease. There is no new imaging to  review since 7/12. MRI on 7/12 showed a punctate possible area of restricted diffusion, which on my review, I think is artifactual or related to seizure edema in hindsight. Also, she might  have underlying dementia, which makes seizures more likely.   IMP Seizure Altered mental status Rule out stroke Behavioral alteration in the setting of dementia  Rec: repeat EEG  Vimpat 50 BID as recommended earlier. Correction of metabolic derangements per primary team Seizure precautions No driving per [place] law unless 6 mo seizure free.  Outpatient neurology follow up   Milon DikesAshish Micha Erck, MD [facility] [phone]  If 7pm to 7am, please call on call as listed on AMION.   Addendum at 4:45 PM EEG was done, also reading shows generalized slowing. No focal slowing. No O graphic seizures. From a neurological standpoint, please continue with Vimpat 50 mg twice a day as recommended. Outpatient neurology clinic follow-up. Please call with questions  Milon DikesAshish Cleo Santucci, MD [facility] [phone]  If 7pm to 7am, please call on call as listed on AMION.

## 2016-12-12 DIAGNOSIS — G40209 Localization-related (focal) (partial) symptomatic epilepsy and epileptic syndromes with complex partial seizures, not intractable, without status epilepticus: Secondary | ICD-10-CM | POA: Diagnosis present

## 2016-12-12 DIAGNOSIS — H269 Unspecified cataract: Secondary | ICD-10-CM | POA: Diagnosis present

## 2016-12-12 DIAGNOSIS — Z833 Family history of diabetes mellitus: Secondary | ICD-10-CM | POA: Diagnosis not present

## 2016-12-12 DIAGNOSIS — Z66 Do not resuscitate: Secondary | ICD-10-CM | POA: Diagnosis present

## 2016-12-12 DIAGNOSIS — I10 Essential (primary) hypertension: Secondary | ICD-10-CM | POA: Diagnosis present

## 2016-12-12 DIAGNOSIS — R55 Syncope and collapse: Secondary | ICD-10-CM | POA: Diagnosis present

## 2016-12-12 DIAGNOSIS — E785 Hyperlipidemia, unspecified: Secondary | ICD-10-CM | POA: Diagnosis present

## 2016-12-12 DIAGNOSIS — K219 Gastro-esophageal reflux disease without esophagitis: Secondary | ICD-10-CM | POA: Diagnosis present

## 2016-12-12 DIAGNOSIS — M8580 Other specified disorders of bone density and structure, unspecified site: Secondary | ICD-10-CM | POA: Diagnosis present

## 2016-12-12 DIAGNOSIS — Z882 Allergy status to sulfonamides status: Secondary | ICD-10-CM | POA: Diagnosis not present

## 2016-12-12 DIAGNOSIS — Z90711 Acquired absence of uterus with remaining cervical stump: Secondary | ICD-10-CM | POA: Diagnosis not present

## 2016-12-12 DIAGNOSIS — Z880 Allergy status to penicillin: Secondary | ICD-10-CM | POA: Diagnosis not present

## 2016-12-12 DIAGNOSIS — Z887 Allergy status to serum and vaccine status: Secondary | ICD-10-CM | POA: Diagnosis not present

## 2016-12-12 DIAGNOSIS — Z818 Family history of other mental and behavioral disorders: Secondary | ICD-10-CM | POA: Diagnosis not present

## 2016-12-12 DIAGNOSIS — R9401 Abnormal electroencephalogram [EEG]: Secondary | ICD-10-CM | POA: Diagnosis present

## 2016-12-12 DIAGNOSIS — Z881 Allergy status to other antibiotic agents status: Secondary | ICD-10-CM | POA: Diagnosis not present

## 2016-12-12 DIAGNOSIS — Z885 Allergy status to narcotic agent status: Secondary | ICD-10-CM | POA: Diagnosis not present

## 2016-12-12 DIAGNOSIS — Z91040 Latex allergy status: Secondary | ICD-10-CM | POA: Diagnosis not present

## 2016-12-12 DIAGNOSIS — R9082 White matter disease, unspecified: Secondary | ICD-10-CM | POA: Diagnosis present

## 2016-12-12 DIAGNOSIS — Z825 Family history of asthma and other chronic lower respiratory diseases: Secondary | ICD-10-CM | POA: Diagnosis not present

## 2016-12-12 DIAGNOSIS — F0390 Unspecified dementia without behavioral disturbance: Secondary | ICD-10-CM | POA: Diagnosis present

## 2016-12-12 DIAGNOSIS — Z8249 Family history of ischemic heart disease and other diseases of the circulatory system: Secondary | ICD-10-CM | POA: Diagnosis not present

## 2016-12-12 DIAGNOSIS — Z888 Allergy status to other drugs, medicaments and biological substances status: Secondary | ICD-10-CM | POA: Diagnosis not present

## 2016-12-12 DIAGNOSIS — Z91048 Other nonmedicinal substance allergy status: Secondary | ICD-10-CM | POA: Diagnosis not present

## 2016-12-12 NOTE — Progress Notes (Signed)
PROGRESS NOTE    Amber Rollins  XBM:[id number] DOB: [birth year] DOA: 12/10/2016 PCP: [name], MD   Brief Narrative:  Amber Rollins is a 83 y.o. female with history of dementia, hypertension, hyperlipidemia who was discharged to nursing home 7/13 after eval for syncope. Pt had another syncopal event at SNF. At the time the episode was witnessed by patient's daughter and had noticed that patient had flickering of the eyes for a few minutes for which patient loss consciousness and was confused. Did not have any incontinence of urine. During the last admission patient had stroke workup including MRI carotid Doppler 2-D echo and also had EEG. MRI was showing small lacunar infarct. Neurologist on-call was consulted and patient was started on Vimpat given the history suggesting complex partial seizures.   Assessment & Plan:   Principal Problem:   Complex partial seizure [facility]) Active Problems:   Essential hypertension   Syncope   Dementia   Seizure (HCC)    1. Complex partial seizures - appreciate neurology consult. At this time patient has been placed on Vimpat. EEG showed slowing. Neurology recommends outpatient discharged on Vimpat. They plan to see her in follow-up. Daughter requests that neurology office call her to schedule appointment. 2. Hypertension recently started on metoprolol. 3. Hyperlipidemia patient was started on Zetiaand is on fish oil. I had an extensive discussion with the patient's daughter about her medications. She wishes patient only to be on medications that will improve her quality of life or have proven benefit. At this point treating an 83 year old for hyperlipidemia does not seem to be in congruence with plan as described by her daughter. I am going to discontinue Lovaza and Zetia 4. Dementia not on any medications. We would like to pursue medications. They will discuss this with neurology outpatient follow-up. 5. Medication review: All medications  were reviewed patient's daughter and plan is for her to discharge on buspirone, Flonase, aspirin, Vimpat, Lopressor, and pantoprazole.  DVT prophylaxis: Lovenox. Code Status: DO NOT RESUSCITATE.  Family Communication: Patient's daughter  Disposition Plan: Back to nursing facility.  Consults called: Neurology.     Subjective: Patient had difficulty last night was taking her medicines. Nursing staff had a call patient's daughter to encourage her to take her medicines ultimately she did. Her graft this morning I discussed with the patient's importance of taking her medicines because of her seizures. She did agree to take them today. Will need to continue encouraging patient to take her medicines and to continue to remind her that she needs them for seizures.  Objective: Vitals:   12/11/16 1734 12/11/16 2121 12/12/16 0627 12/12/16 0932  BP: (!) 176/78 (!) 150/95 (!) 160/69 (!) 187/61  Pulse: 72 77 (!) 53 62  Resp: 18 18 18 18   Temp: 98.4 F (36.9 C) 99 F (37.2 C) 98.6 F (37 C) 98.2 F (36.8 C)  TempSrc: Oral Oral Oral Oral  SpO2: 98% 94% 95% 96%  Weight:      Height:        Intake/Output Summary (Last 24 hours) at 12/12/16 1155 Last data filed at 12/12/16 0930  Gross per 24 hour  Intake              560 ml  Output                1 ml  Net              559 ml   Filed Weights   12/10/16 1955  12/11/16 0040  Weight: 59 kg (130 lb 1.1 oz) 61.6 kg (135 lb 11.2 oz)    Examination:  General exam: Appears calm and comfortable  Respiratory system: Clear to auscultation. Respiratory effort normal. Cardiovascular system: S1 & S2 heard, RRR. No JVD, murmurs, rubs, gallops or clicks. No pedal edema. Gastrointestinal system: Abdomen is nondistended, soft and nontender. No organomegaly or masses felt. Normal bowel sounds heard. Central nervous system: Alert and oriented. No focal neurological deficits. Extremities: Symmetric 5 x 5 power. Skin: No rashes, lesions or  ulcers Psychiatry: Judgement and insight appear Poor. Mood & affect appropriate.     Data Reviewed: I have personally reviewed following labs and imaging studies  CBC:  Recent Labs Lab 12/08/16 1132 12/09/16 0522 12/10/16 1936 12/11/16 0527  WBC 8.9 8.0 11.1* 8.8  NEUTROABS  --   --  5.8  --   HGB 13.2 13.3 14.2 13.5  HCT 40.2 40.7 43.9 41.3  MCV 91.6 90.6 91.6 89.8  PLT 270 298 290 298   Basic Metabolic Panel:  Recent Labs Lab 12/08/16 1132 12/09/16 0522 12/10/16 0554 12/10/16 1936 12/11/16 0527  NA 143 144 144 138 134*  K 3.5 3.3* 3.8 4.2 3.8  CL 105 106 107 101 100*  CO2 30 30 28 22 25   GLUCOSE 150* 98 104* 123* 103*  BUN 19 15 15  24* 22*  CREATININE 1.03* 0.94 0.94 1.86* 1.15*  CALCIUM 9.6 9.4 9.6 9.6 9.1  MG  --  1.8 1.9  --   --    GFR: Estimated Creatinine Clearance: 30.7 mL/min (A) (by C-G formula based on SCr of 1.15 mg/dL (H)). Liver Function Tests:  Recent Labs Lab 12/10/16 1936  AST 23  ALT 14  ALKPHOS 66  BILITOT 0.5  PROT 6.5  ALBUMIN 3.9   No results for input(s): LIPASE, AMYLASE in the last 168 hours. No results for input(s): AMMONIA in the last 168 hours. Coagulation Profile:  Recent Labs Lab 12/10/16 1936  INR 1.01   Cardiac Enzymes: No results for input(s): CKTOTAL, CKMB, CKMBINDEX, TROPONINI in the last 168 hours. BNP (last 3 results) No results for input(s): PROBNP in the last 8760 hours. HbA1C:  Recent Labs  12/10/16 0554  HGBA1C 5.8*   CBG:  Recent Labs Lab 12/08/16 1137 12/10/16 0558 12/10/16 2001  GLUCAP 118* 113* 155*   Lipid Profile:  Recent Labs  12/10/16 0554  CHOL 376*  HDL 55  LDLCALC 300*  TRIG 104  CHOLHDL 6.8   Thyroid Function Tests: No results for input(s): TSH, T4TOTAL, FREET4, T3FREE, THYROIDAB in the last 72 hours. Anemia Panel: No results for input(s): VITAMINB12, FOLATE, FERRITIN, TIBC, IRON, RETICCTPCT in the last 72 hours. Sepsis Labs: No results for input(s): PROCALCITON,  LATICACIDVEN in the last 168 hours.  Recent Results (from the past 240 hour(s))  MRSA PCR Screening     Status: None   Collection Time: 12/08/16  8:58 PM  Result Value Ref Range Status   MRSA by PCR NEGATIVE NEGATIVE Final    Comment:        The GeneXpert MRSA Assay (FDA approved for NASAL specimens only), is one component of a comprehensive MRSA colonization surveillance program. It is not intended to diagnose MRSA infection nor to guide or monitor treatment for MRSA infections.          Radiology Studies: Ct Head Code Stroke W/o Cm  Result Date: 12/10/2016 CLINICAL DATA:  Code stroke. Rule out stroke. Altered mental status. EXAM: CT HEAD WITHOUT  CONTRAST TECHNIQUE: Contiguous axial images were obtained from the base of the skull through the vertex without intravenous contrast. COMPARISON:  12/08/2016 CT head.  12/09/2016 MRI head. FINDINGS: Brain: No evidence of acute infarction, hemorrhage, hydrocephalus, extra-axial collection or mass lesion/mass effect. Stable chronic infarcts within the bilateral anterior basal ganglia, advanced chronic microvascular ischemic changes of the brain, and moderate parenchymal volume loss. Vascular: No hyperdense vessel identified. Calcific atherosclerosis of carotid siphons and vertebral arteries. Skull: Normal. Negative for fracture or focal lesion. Sinuses/Orbits: No acute finding. Other: None. ASPECTS [facility] Stroke Program Early CT Score) - Ganglionic level infarction (caudate, lentiform nuclei, internal capsule, insula, M1-M3 cortex): 7 - Supraganglionic infarction (M4-M6 cortex): 3 Total score (0-10 with 10 being normal): 10 IMPRESSION: 1. No acute intracranial abnormality identified. 2. ASPECTS is 10 3. Stable advanced chronic microvascular ischemic changes of white matter, moderate parenchymal volume loss of the brain, and chronic lacunar infarcts within the anterior basal ganglia bilaterally. These results were called by telephone at the time  of interpretation on 12/10/2016 at 8:04 pm to Dr. Gracy Racer, who verbally acknowledged these results. Electronically Signed   By: Mitzi Hansen M.D.   On: 12/10/2016 20:05        Scheduled Meds: . aspirin  81 mg Oral Daily  . busPIRone  15 mg Oral BID  . enoxaparin (LOVENOX) injection  30 mg Subcutaneous Q24H  . ezetimibe  10 mg Oral Daily  . fluticasone  2 spray Each Nare Daily  . metoprolol tartrate  25 mg Oral BID  . omega-3 acid ethyl esters  1 g Oral Daily  . pantoprazole  40 mg Oral Daily   Continuous Infusions: . lacosamide (VIMPAT) IV 50 mg (12/12/16 1028)     LOS: 0 days    Time spent: 40 minutes in care coordination with this patient in discussion with her daughter discussion with the nurse and with the patient as well as physical evaluation.    Lahoma Crocker, MD, FACP [facility] Pager [phone]  If 7PM-7AM, please contact night-coverage [email or web address] Password [email or web address] 12/12/2016, 11:55 AM

## 2016-12-13 DIAGNOSIS — I10 Essential (primary) hypertension: Secondary | ICD-10-CM

## 2016-12-13 DIAGNOSIS — F0390 Unspecified dementia without behavioral disturbance: Secondary | ICD-10-CM

## 2016-12-13 DIAGNOSIS — R55 Syncope and collapse: Secondary | ICD-10-CM

## 2016-12-13 LAB — POCT I-STAT TROPONIN I: Troponin i, poc: 0.01 ng/mL (ref 0.00–0.08)

## 2016-12-13 LAB — POCT I-STAT, CHEM 8
BUN: 33 mg/dL — ABNORMAL HIGH (ref 6–20)
CREATININE: 1.9 mg/dL — AB (ref 0.44–1.00)
Calcium, Ion: 1.12 mmol/L — ABNORMAL LOW (ref 1.15–1.40)
Chloride: 104 mmol/L (ref 101–111)
Glucose, Bld: 128 mg/dL — ABNORMAL HIGH (ref 65–99)
HEMATOCRIT: 45 % (ref 36.0–46.0)
HEMOGLOBIN: 15.3 g/dL — AB (ref 12.0–15.0)
Potassium: 4.4 mmol/L (ref 3.5–5.1)
SODIUM: 140 mmol/L (ref 135–145)
TCO2: 27 mmol/L (ref 0–100)

## 2016-12-13 LAB — BASIC METABOLIC PANEL
Anion gap: 8 (ref 5–15)
BUN: 16 mg/dL (ref 6–20)
CALCIUM: 9.6 mg/dL (ref 8.9–10.3)
CO2: 28 mmol/L (ref 22–32)
CREATININE: 1.06 mg/dL — AB (ref 0.44–1.00)
Chloride: 104 mmol/L (ref 101–111)
GFR calc non Af Amer: 47 mL/min — ABNORMAL LOW (ref >60)
GFR, EST AFRICAN AMERICAN: 55 mL/min — AB (ref >60)
Glucose, Bld: 132 mg/dL — ABNORMAL HIGH (ref 65–99)
Potassium: 3.6 mmol/L (ref 3.5–5.1)
SODIUM: 140 mmol/L (ref 135–145)

## 2016-12-13 MED ORDER — TRAMADOL HCL 50 MG PO TABS: ORAL_TABLET | ORAL | 0 refills | Status: DC

## 2016-12-13 MED ORDER — LACOSAMIDE 50 MG PO TABS: 50.0000 mg | ORAL_TABLET | Freq: Two times a day (BID) | ORAL | 1 refills | Status: DC

## 2016-12-13 MED ORDER — BUSPIRONE HCL 15 MG PO TABS: 15.0000 mg | ORAL_TABLET | Freq: Two times a day (BID) | ORAL | 0 refills | Status: DC

## 2016-12-13 NOTE — Progress Notes (Signed)
Discharge to: [facility] Anticipated discharge date: 12/13/16 Family notified: Yes, by phone Transportation by: Family car  Report #: [phone]  CSW signing off.  Blenda Nicelylizabeth Bathsheba Durrett LCSW [phone]

## 2016-12-13 NOTE — Evaluation (Signed)
Physical Therapy Evaluation Patient Details Name: Amber SinkShirley Anne Gritz MRN: [id number] DOB: 1930-09-05 Today's Date: 12/13/2016   History of Present Illness  83 y.o. female admitted from snf with syncopal event. MRI was showing small lacunar infarct. PMH: dementia HTN hyperlipidemia  Clinical Impression  Orders received for PT evaluation. Patient demonstrates deficits in functional mobility as indicated below. Will benefit from continued skilled PT to address deficits and maximize function. Will see as indicated and progress as tolerated.  At this time, feel patient will need ST SNF upon acute discharge to maximize safety and functional mobility.    Follow Up Recommendations SNF    Equipment Recommendations  None recommended by PT    Recommendations for Other Services       Precautions / Restrictions Precautions Precautions: Fall Restrictions Weight Bearing Restrictions: No      Mobility  Bed Mobility Overal bed mobility: Needs Assistance Bed Mobility: Supine to Sit;Sit to Supine     Supine to sit: Min assist Sit to supine: Min assist      Transfers Overall transfer level: Needs assistance Equipment used: Rolling walker (2 wheeled) Transfers: Sit to/from Stand Sit to Stand: Min assist;+2 physical assistance         General transfer comment: Min assist to elevate from standing from bed, BSC, and chair  Ambulation/Gait Ambulation/Gait assistance: Min assist Ambulation Distance (Feet): 90 Feet Assistive device: Rolling walker (2 wheeled) Gait Pattern/deviations: Decreased step length - right;Decreased step length - left;Step-through pattern Gait velocity: decreased Gait velocity interpretation: <1.8 ft/sec, indicative of risk for recurrent falls General Gait Details: min assist for stability, cues to direct to task, min assist for maneuvering RW in room  Stairs            Wheelchair Mobility    Modified Rankin (Stroke Patients Only)       Balance  Overall balance assessment: History of Falls;Needs assistance Sitting-balance support: Feet supported Sitting balance-Leahy Scale: Fair     Standing balance support: Bilateral upper extremity supported Standing balance-Leahy Scale: Poor Standing balance comment: posterior bias                             Pertinent Vitals/Pain      Home Living Family/patient expects to be discharged to:: Assisted living               Home Equipment: Walker - 4 wheels      Prior Function Level of Independence: Independent with assistive device(s)         Comments: Amb with rollator     Hand Dominance   Dominant Hand: Right    Extremity/Trunk Assessment   Upper Extremity Assessment Upper Extremity Assessment: Generalized weakness    Lower Extremity Assessment Lower Extremity Assessment: Generalized weakness    Cervical / Trunk Assessment Cervical / Trunk Assessment: Kyphotic  Communication   Communication: No difficulties  Cognition Arousal/Alertness: Awake/alert Behavior During Therapy: WFL for tasks assessed/performed Overall Cognitive Status: Impaired/Different from baseline Area of Impairment: Orientation;Attention;Memory;Safety/judgement;Awareness;Problem solving                 Orientation Level: Disoriented to;Situation;Time;Place Current Attention Level: Sustained Memory: Decreased short-term memory   Safety/Judgement: Decreased awareness of safety Awareness: Intellectual Problem Solving: Slow processing;Decreased initiation;Difficulty sequencing;Requires verbal cues;Requires tactile cues General Comments: easily distracted throughout session      General Comments      Exercises     Assessment/Plan    PT Assessment Patient needs  continued PT services  PT Problem List Decreased strength;Decreased range of motion;Decreased mobility;Decreased safety awareness       PT Treatment Interventions DME instruction;Gait training;Therapeutic  activities;Therapeutic exercise;Functional mobility training    PT Goals (Current goals can be found in the Care Plan section)  Acute Rehab PT Goals Patient Stated Goal: to go home PT Goal Formulation: With patient Time For Goal Achievement: 12/17/16 Potential to Achieve Goals: Good    Frequency Min 3X/week   Barriers to discharge        Co-evaluation PT/OT/SLP Co-Evaluation/Treatment: Yes Reason for Co-Treatment: For patient/therapist safety;To address functional/ADL transfers PT goals addressed during session: Mobility/safety with mobility OT goals addressed during session: ADL's and self-care;Proper use of Adaptive equipment and DME;Strengthening/ROM       AM-PAC PT "6 Clicks" Daily Activity  Outcome Measure Difficulty turning over in bed (including adjusting bedclothes, sheets and blankets)?: Total Difficulty moving from lying on back to sitting on the side of the bed? : Total Difficulty sitting down on and standing up from a chair with arms (e.g., wheelchair, bedside commode, etc,.)?: Total Help needed moving to and from a bed to chair (including a wheelchair)?: A Little Help needed walking in hospital room?: A Little Help needed climbing 3-5 steps with a railing? : A Little 6 Click Score: 12    End of Session Equipment Utilized During Treatment: Gait belt Activity Tolerance: Patient tolerated treatment well Patient left: in bed;with call bell/phone within reach;with bed alarm set Nurse Communication: Mobility status PT Visit Diagnosis: Other abnormalities of gait and mobility (R26.89);History of falling (Z91.81)    Time: 9604-5409 PT Time Calculation (min) (ACUTE ONLY): 24 min   Charges:   PT Evaluation $PT Eval Moderate Complexity: 1 Procedure     PT G Codes:        Charlotte Crumb, PT DPT [facility] [phone]   Fabio Asa 12/13/2016, 1:18 PM

## 2016-12-13 NOTE — Evaluation (Signed)
Occupational Therapy Evaluation Patient Details Name: Amber SinkShirley Anne Martensen MRN: [id number] DOB: [birth year] Today's Date: 12/13/2016    History of Present Illness   83 y.o.femaleadmitted from snf with syncopal event. MRI was showing small lacunar infarct. PMH: dementia HTN hyperlipidemia   Clinical Impression   Pt  current D/C plan is SNF. No apparent immediate acute care OT needs, therefore will defer OT to SNF. If OT eval is needed please call Acute Rehab Dept. at [phone] or text page OT at [phone].      Follow Up Recommendations  SNF    Equipment Recommendations  3 in 1 bedside commode    Recommendations for Other Services       Precautions / Restrictions Precautions Precautions: Fall Restrictions Weight Bearing Restrictions: No      Mobility Bed Mobility Overal bed mobility: Needs Assistance Bed Mobility: Supine to Sit;Sit to Supine     Supine to sit: Min assist Sit to supine: Min assist      Transfers Overall transfer level: Needs assistance Equipment used: Rolling walker (2 wheeled) Transfers: Sit to/from Stand Sit to Stand: Min assist;+2 physical assistance         General transfer comment: Min assist to elevate from standing from bed, BSC, and chair    Balance Overall balance assessment: History of Falls;Needs assistance   Sitting balance-Leahy Scale: Fair     Standing balance support: Bilateral upper extremity supported Standing balance-Leahy Scale: Poor Standing balance comment: posterior bias                           ADL either performed or assessed with clinical judgement   ADL Overall ADL's : Needs assistance/impaired     Grooming: Wash/dry hands;Wash/dry face;Min guard;Standing Grooming Details (indicate cue type and reason): cues to locate all items and awareness to L visual field for paper towel.             Lower Body Dressing: Maximal assistance Lower Body Dressing Details (indicate cue type and  reason): pt unable to thread or pull up R foot socka nd extensive effort for hte L sock Toilet Transfer: Minimal assistance   Toileting- Clothing Manipulation and Hygiene: Minimal assistance Toileting - Clothing Manipulation Details (indicate cue type and reason): cues for grab bar safety with 3n1 and to push/ descend with support of bil ue     Functional mobility during ADLs: Minimal assistance;Rolling walker General ADL Comments: pt very pleasant and agreeable. pt declines toilet transfer but when transfered voided bladder.     Vision         Perception     Praxis      Pertinent Vitals/Pain       Hand Dominance Right   Extremity/Trunk Assessment Upper Extremity Assessment Upper Extremity Assessment: Generalized weakness   Lower Extremity Assessment Lower Extremity Assessment: Generalized weakness   Cervical / Trunk Assessment Cervical / Trunk Assessment: Kyphotic   Communication Communication Communication: No difficulties   Cognition Arousal/Alertness: Awake/alert Behavior During Therapy: WFL for tasks assessed/performed Overall Cognitive Status: Impaired/Different from baseline Area of Impairment: Orientation;Attention;Memory;Safety/judgement;Awareness;Problem solving                 Orientation Level: Disoriented to;Situation;Time;Place Current Attention Level: Sustained Memory: Decreased short-term memory   Safety/Judgement: Decreased awareness of safety Awareness: Intellectual Problem Solving: Slow processing;Decreased initiation;Difficulty sequencing;Requires verbal cues;Requires tactile cues General Comments: easily distracted throughout session   General Comments       Exercises  Shoulder Instructions      Home Living Family/patient expects to be discharged to:: Assisted living                             Home Equipment: Walker - 4 wheels          Prior Functioning/Environment Level of Independence: Independent with  assistive device(s)        Comments: Amb with rollator        OT Problem List:        OT Treatment/Interventions:      OT Goals(Current goals can be found in the care plan section) Acute Rehab OT Goals Patient Stated Goal: to go home  OT Frequency:     Barriers to D/C:            Co-evaluation PT/OT/SLP Co-Evaluation/Treatment: Yes Reason for Co-Treatment: For patient/therapist safety;To address functional/ADL transfers PT goals addressed during session: Mobility/safety with mobility OT goals addressed during session: ADL's and self-care;Proper use of Adaptive equipment and DME;Strengthening/ROM      AM-PAC PT "6 Clicks" Daily Activity     Outcome Measure Help from another person eating meals?: None Help from another person taking care of personal grooming?: A Little Help from another person toileting, which includes using toliet, bedpan, or urinal?: A Little Help from another person bathing (including washing, rinsing, drying)?: A Little Help from another person to put on and taking off regular upper body clothing?: A Little Help from another person to put on and taking off regular lower body clothing?: A Lot 6 Click Score: 18   End of Session Equipment Utilized During Treatment: Gait belt;Rolling walker Nurse Communication: Mobility status;Precautions  Activity Tolerance: Patient tolerated treatment well Patient left: Other (comment) (with PT Devon in hall)  OT Visit Diagnosis: Unsteadiness on feet (R26.81)                Time: 1610-9604 OT Time Calculation (min): 16 min Charges:  OT General Charges $OT Visit: 1 Procedure OT Evaluation $OT Eval Moderate Complexity: 1 Procedure G-Codes:      Mateo Flow   OTR/L Pager: [phone] Office: [phone] .   Boone Master B 12/13/2016, 1:13 PM

## 2016-12-13 NOTE — Progress Notes (Signed)
Pt. Discharged; [facility] to give [facility]

## 2016-12-13 NOTE — [place] FL2 (Signed)
[place] MEDICAID FL2 LEVEL OF CARE SCREENING TOOL     IDENTIFICATION  Patient Name: Amber Rollins Birthdate: 03-09-1931 Sex: female Admission Date (Current Location): 12/10/2016  [facility] [place]   Facility and Address:  [facility], [address], [place], [place] [address]      Provider Number: [id number]  Attending Physician Name and Address:  Rodolph Bonghompson, Daniel V, MD  Relative Name and Phone Number:       Current Level of Care: Hospital Recommended Level of Care: Assisted Living Facility Prior Approval Number:    Date Approved/Denied:   PASRR Number:    Discharge Plan: Other (Comment) (ALF)    Current Diagnoses: Patient Active Problem List   Diagnosis Date Noted  . Complex partial seizure (HCC) 12/11/2016  . Dementia 12/11/2016  . Seizure (HCC) 12/11/2016  . Acute encephalopathy 12/10/2016  . Syncope 12/08/2016  . Altered mental state 10/12/2016  . Foot pain, bilateral 08/23/2016  . Diarrhea 03/29/2016  . Hyponatremia 01/01/2016  . Routine general medical examination at a health care facility 11/10/2015  . Urinary frequency 10/23/2014  . Generalized weakness 10/23/2014  . Hammer toe 09/20/2014  . Herpetic dermatitis 08/09/2014  . Poor balance 04/11/2013  . Thoracic back pain 04/11/2013  . Urinary incontinence 12/27/2012  . Thoracic kyphosis 09/24/2012  . Fall 08/09/2012  . Hemorrhoid 12/24/2011  . Dysuria 12/24/2011  . Low back pain 12/24/2011  . Hypokalemia 10/06/2010  . HEMATURIA UNSPECIFIED 10/02/2009  . NIGHT SWEATS 10/02/2009  . GERD 06/18/2009  . INCONTINENCE, URGE 06/18/2009  . FATIGUE 03/12/2009  . HYPOKALEMIA 10/08/2008  . MENOPAUSAL SYNDROME 01/22/2008  . EDEMA 07/19/2007  . Anxiety and depression 06/21/2007  . FOOT PAIN, CHRONIC 03/06/2007  . ALLERGIC RHINITIS, SEASONAL 09/12/2006  . IRRITABLE BOWEL SYNDROME 09/12/2006  . FIBROCYSTIC BREAST DISEASE 09/12/2006  . OSTEOARTHRITIS  09/12/2006  . HEMORRHOIDS, HX OF 09/12/2006  . Essential hypertension 05/31/2000  . Disorder of bone and cartilage 11/29/1999  . Hyperlipidemia 05/31/1992    Orientation RESPIRATION BLADDER Height & Weight     Self  Normal Incontinent Weight: 135 lb 11.2 oz (61.6 kg) Height:  5\' 3"  (160 cm)  BEHAVIORAL SYMPTOMS/MOOD NEUROLOGICAL BOWEL NUTRITION STATUS    Convulsions/Seizures Continent    AMBULATORY STATUS COMMUNICATION OF NEEDS Skin   Supervision Verbally Normal                       Personal Care Assistance Level of Assistance  Bathing, Feeding, Dressing Bathing Assistance: Limited assistance Feeding assistance: Limited assistance Dressing Assistance: Limited assistance     Functional Limitations Info             SPECIAL CARE FACTORS FREQUENCY  PT (By licensed PT)     PT Frequency: 3x/wk              Contractures      Additional Factors Info  Code Status, Allergies, Psychotropic Code Status Info: DNR Allergies Info: Tdap Diphth-acell Pertussis-tetanus, Amlodipine Besylate, Codeine, Hydrocodone, Lovastatin, Metronidazole, Oxycodone, Penicillins, Sertraline Hcl, Simvastatin, Tape, Latex, Sulfonamide Derivatives Psychotropic Info: Buspar 15mg          Current Medications (12/13/2016):  This is the current hospital active medication list Current Facility-Administered Medications  Medication Dose Route Frequency Provider Last Rate Last Dose  . acetaminophen (TYLENOL) tablet 650 mg  650 mg Oral Q6H PRN Eduard ClosKakrakandy, Arshad N, MD   650 mg at 12/11/16 2320   Or  . acetaminophen (TYLENOL)  suppository 650 mg  650 mg Rectal Q6H PRN Eduard Clos, MD      . aspirin chewable tablet 81 mg  81 mg Oral Daily Eduard Clos, MD   81 mg at 12/13/16 0859  . busPIRone (BUSPAR) tablet 15 mg  15 mg Oral BID Eduard Clos, MD   15 mg at 12/13/16 0858  . enoxaparin (LOVENOX) injection 30 mg  30 mg Subcutaneous Q24H Eduard Clos, MD   30 mg at  12/12/16 1209  . fluticasone (FLONASE) 50 MCG/ACT nasal spray 2 spray  2 spray Each Nare Daily Eduard Clos, MD   2 spray at 12/13/16 [phone]  . lacosamide (VIMPAT) 50 mg in sodium chloride 0.9 % 25 mL IVPB  50 mg Intravenous Q12H Weston Settle, MD   Stopped at 12/13/16 1022  . metoprolol tartrate (LOPRESSOR) tablet 25 mg  25 mg Oral BID Eduard Clos, MD   25 mg at 12/13/16 0858  . ondansetron (ZOFRAN) tablet 4 mg  4 mg Oral Q6H PRN Eduard Clos, MD       Or  . ondansetron [facility]) injection 4 mg  4 mg Intravenous Q6H PRN Eduard Clos, MD      . pantoprazole (PROTONIX) EC tablet 40 mg  40 mg Oral Daily Eduard Clos, MD   40 mg at 12/13/16 9604     Discharge Medications: Please see discharge summary for a list of discharge medications.  Relevant Imaging Results:  Relevant Lab Results:   Additional Information SS#: [id number]  Baldemar Lenis, LCSW

## 2016-12-13 NOTE — Consult Note (Signed)
   [facility]   12/13/2016  Lenn SinkShirley Anne Robarge Jan 28, 1931 [id number]   Patient was evaluated for re-admissions in the [employer] plan/ACO.  Patient is an 83 year old admitted for small posterior temporal infarct with HX of dementia and old infarcts.  Patient is with some confusion.  Notes reveals she [facility] and her disposition is to return there or a skilled nursing facility in which if short term plans to [facility] per notes in which there are no community care management needs.   For questions, please contact:  Charlesetta ShanksVictoria Taysean Wager, RN BSN CCM [facility]  [phone] business mobile phone Toll free office [phone]

## 2016-12-13 NOTE — Discharge Summary (Signed)
Physician Discharge Summary  Amber Rollins ZOX:[id number] DOB: [birth year] DOA: 12/10/2016  PCP: [name], MD  Admit date: 12/10/2016 Discharge date: 12/13/2016  Time spent: 65 minutes  Recommendations for Outpatient Follow-up:  1. Follow-up with Tower, Audrie Gallus, MD in 2 weeks 2. Follow-up with [facility] neurology in 2-3 weeks.   Discharge Diagnoses:  Principal Problem:   Complex partial seizure [facility]) Active Problems:   Essential hypertension   Syncope   Dementia   Seizure [facility])   Discharge Condition: Stable and improved  Diet recommendation: Heart healthy  Filed Weights   12/10/16 1955 12/11/16 0040  Weight: 59 kg (130 lb 1.1 oz) 61.6 kg (135 lb 11.2 oz)    History of present illness:  Per Dr Toniann Fail : Amber Rollins is a 83 y.o. female with history of dementia, hypertension, hyperlipidemia who was discharged to nursing home last evening around 3 PM was walking with the help of a walker when patient sat down and had sudden syncopal episode as noticed by the patient's nurse. Patient had a similar episode 3 days ago when patient was admitted. At the time the episode was witnessed by patient's daughter and had noticed that patient had flickering of the eyes for a few minutes for which patient loss consciousness and was confused. Did not have any incontinence of urine. During the last admission patient had stroke workup including MRI carotid Doppler 2-D echo and also had EEG. MRI was showing small lacunar infarct.   ED Course: In the ER CT head is unremarkable. Neurologist on-call was consulted and patient was started on Vimpat given the history suggesting complex partial seizures. As per the family patient was still not back to baseline.  Hospital Course:  Brief Narrative:  Amber Rollins a 83 y.o.femalewith history of dementia, hypertension, hyperlipidemia who was discharged to nursing home 7/13 after eval for syncope. Pt had another syncopal event  at SNF. At the time the episode was witnessed by patient's daughter and had noticed that patient had flickering of the eyes for a few minutes for which patient loss consciousness and was confused. Did not have any incontinence of urine. During the last admission patient had stroke workup including MRI carotid Doppler 2-D echo and also had EEG. MRI was showing small lacunar infarct.Neurologist on-call was consulted and patient was started on Vimpat given the history suggesting complex partial seizures.   1. Complex partial seizures - patient was seen in consultation by neurology on admission. CT head which was done showed no acute abnormalities. Patient was placed on Vimpat. EEG showed slowing. Neurology recommended outpatient discharge on Vimpat. They plan to see her in follow-up. Patient did not have any further episodes during the hospitalization. Patient is to follow-up with neurology in the outpatient setting. 2. Hypertension recently started on metoprolol.Outpatrient follow up. 3. Hyperlipidemia patient was started on Zetiaand is on fish oil. Dr Willette Pa had an extensive discussion with the patient's daughter about her medications. She wished patient only to be on medications that will improve her quality of life or have proven benefit. At this point treating an 83 year old for hyperlipidemia did not seem to be in congruence with plan as described by her daughter. Dr Willette Pa discontinued Lovaza and Zetia 4. Dementia not on any medications. They will discuss this with neurology outpatient follow-up. 5. Medication review: All medications were reviewed patient's daughter and plan is for her to discharge on buspirone, Flonase, aspirin, Vimpat, Lopressor, and pantoprazole.  Procedures:  Chest x-ray 12/08/2016  CT head  12/10/2016  EEG 12/11/2016  Consultations:  Neurology: Dr. Wilford CornerARORA 12/09/2016/Dr.ESHRAGHI 4 12/10/2016    Discharge Exam: Vitals:   12/13/16 0949 12/13/16 1440  BP: (!) 105/50  (!) 148/74  Pulse: (!) 56 (!) 54  Resp: 16 16  Temp: 98 F (36.7 C) 98.8 F (37.1 C)    General: NAD Cardiovascular: RRR Respiratory: CTAB  Discharge Instructions   Discharge Instructions    Ambulatory referral to Neurology    Complete by:  As directed    An appointment is requested in approximately: 2-3 weeks.   Diet - low sodium heart healthy    Complete by:  As directed    Increase activity slowly    Complete by:  As directed      Current Discharge Medication List    START taking these medications   Details  lacosamide (VIMPAT) 50 MG TABS tablet Take 1 tablet (50 mg total) by mouth 2 (two) times daily. Qty: 60 tablet, Refills: 1      CONTINUE these medications which have CHANGED   Details  busPIRone (BUSPAR) 15 MG tablet Take 1 tablet (15 mg total) by mouth 2 (two) times daily. Qty: 20 tablet, Refills: 0    traMADol (ULTRAM) 50 MG tablet TAKE 1 TABLET BY MOUTH EVERY 8 HOURS AS NEEDED FOR SEVERE PAIN. Qty: 20 tablet, Refills: 0      CONTINUE these medications which have NOT CHANGED   Details  acetaminophen (TYLENOL) 325 MG tablet Take 650 mg by mouth every 4 (four) hours as needed (for pain).     aspirin 81 MG chewable tablet Chew 1 tablet (81 mg total) by mouth daily. Qty: 30 tablet, Refills: 0    azelastine (ASTELIN) 0.1 % nasal spray PLACE 2 SPRAYS INTO BOTH NOSTRILS 2 TIMES DAILY. Qty: 30 mL, Refills: 11    Cholecalciferol (VITAMIN D3) 2000 UNITS TABS Take 1 tablet by mouth daily with breakfast.     fluticasone (FLONASE) 50 MCG/ACT nasal spray PLACE 1 TO 2 SPRAYS IN EACH NOSTRIL DAILY. Qty: 16 g, Refills: 11    metoprolol tartrate (LOPRESSOR) 25 MG tablet Take 0.5 tablets (12.5 mg total) by mouth 2 (two) times daily. Qty: 60 tablet, Refills: 0    mometasone (ELOCON) 0.1 % cream Apply to affected area in small amount 3 times weekly as needed Qty: 45 g, Refills: 3    omega-3 acid ethyl esters (LOVAZA) 1 g capsule Take 1 capsule (1 g total) by mouth  daily. Qty: 30 capsule, Refills: 0    omeprazole (PRILOSEC) 20 MG capsule TAKE 1 CAPSULE BY MOUTH 2 TIMES DAILY. Qty: 180 capsule, Refills: 0      STOP taking these medications     ezetimibe (ZETIA) 10 MG tablet        Allergies  Allergen Reactions  . Tdap [Diphth-Acell Pertussis-Tetanus] Swelling    Unknown site of swelling  . Amlodipine Besylate Other (See Comments)    Edema  . Codeine Other (See Comments)    Headache   . Hydrocodone Other (See Comments)    Hallucinations   . Lovastatin Other (See Comments)    Leg pain  . Metronidazole Swelling and Other (See Comments)    Body aches, also  . Oxycodone Other (See Comments)    Made the patient "loopy"  . Penicillins Itching    Allergy from childhood Has patient had a PCN reaction causing immediate rash, facial/tongue/throat swelling, SOB or lightheadedness with hypotension: Yes Has patient had a PCN reaction causing severe rash involving mucus  membranes or skin necrosis: Unknown Has patient had a PCN reaction that required hospitalization: Unknown Has patient had a PCN reaction occurring within the last 10 years: No If all of the above answers are "NO", then may proceed with Cephalosporin use.   . Sertraline Hcl Other (See Comments)    Insomnia   . Simvastatin Other (See Comments)    Cannot tolerate/ leg pain  . Tape Other (See Comments)    PLEASE USE COBAN WRAP; PATIENT'S SKIN IS VERY THIN AND IT TEARS EASILY!!  . Latex Rash  . Sulfonamide Derivatives Rash   Follow-up Information    Tower, Audrie Gallus, MD. Schedule an appointment as soon as possible for a visit in 2 week(s).   Specialties:  Family Medicine, Radiology Contact information: [address] [place] [address] [phone]        [facility]. Schedule an appointment as soon as possible for a visit in 2 week(s).   Why:  f/u in 2-3 weeks. Contact information: [address] [place], [address] [place]  [address] [phone]           The results of significant diagnostics from this hospitalization (including imaging, microbiology, ancillary and laboratory) are listed below for reference.    Significant Diagnostic Studies: Dg Chest 2 View  Result Date: 12/08/2016 CLINICAL DATA:  Syncope EXAM: CHEST  2 VIEW COMPARISON:  September 03, 2012 FINDINGS: There is no edema or consolidation. Heart size and pulmonary vascularity are normal. No adenopathy. There is aortic atherosclerosis. There is evidence of an old fracture of the inferior sternum with remodeling. There is also a prior fracture of the inferior manubrium with remodeling. Patient has had previous kyphoplasty procedures at T7, T8, T12. There is increase in kyphosis at the T7-T8 level. IMPRESSION: No edema or consolidation. Stable cardiac silhouette. There is aortic atherosclerosis. Evidence of prior fractures. Patient has undergone kyphoplasty procedures in the mid and lower thoracic regions. Aortic Atherosclerosis (ICD10-I70.0). Electronically Signed   By: Bretta Bang III M.D.   On: 12/08/2016 13:27   Ct Head Wo Contrast  Result Date: 12/08/2016 CLINICAL DATA:  Altered mental status with probable seizure EXAM: CT HEAD WITHOUT CONTRAST TECHNIQUE: Contiguous axial images were obtained from the base of the skull through the vertex without intravenous contrast. COMPARISON:  September 06, 2016 FINDINGS: Brain: Mild diffuse atrophy is stable. There is no intracranial mass, hemorrhage, extra-axial fluid collection, or midline shift. There is small vessel disease throughout the centra semiovale bilaterally. There is extensive small vessel disease in the external capsules bilaterally, particularly anteriorly. A lesser degree of small vessel disease is noted in the anterior limb of each internal capsule. There is no evident new gray-white compartment lesion. No acute infarct appreciable. Vascular: No hyperdense vessel. There is calcification in each carotid  siphon. Skull: Bony calvarium appears intact. There is a small left frontal exostosis, benign and stable. Sinuses/Orbits: There is mucosal thickening in several ethmoid air cells bilaterally. Other visualized paranasal sinuses are clear. Orbits appear symmetric bilaterally. Other: Mastoid air cells are clear. There is degenerative change in the temporomandibular joints bilaterally. IMPRESSION: Atrophy with extensive supratentorial small vessel disease, stable. No intracranial mass, hemorrhage, or extra-axial fluid collection. No evident acute infarct. Areas of arterial vascular calcification bilaterally. 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 There is mucosal thickening in several ethmoid air cells. Electronically Signed   By: Bretta Bang III M.D.   On: 12/08/2016 15:37   Mr Brain Wo Contrast  Result Date: 12/09/2016  CLINICAL DATA:  Episode of loss of consciousness.  Confusion. EXAM: MRI HEAD WITHOUT CONTRAST TECHNIQUE: Multiplanar, multiecho pulse sequences of the brain and surrounding structures were obtained without intravenous contrast. COMPARISON:  CT 12/08/2016 FINDINGS: Brain: Study suffers from some motion degradation. Diffusion imaging shows a punctate focus of acute infarction in the left posterior temporal/occipital junction region. No other acute infarction is seen. There chronic small-vessel ischemic changes throughout the pons. No focal cerebellar insult. Cerebral hemispheres show confluent chronic small vessel ischemic changes affecting the white matter. There are old infarctions in the basal gangliar regions. Mild small vessel change affects the thalami. No mass lesion, acute hemorrhage, hydrocephalus or extra-axial collection. Vascular: Major vessels at the base of the brain show flow. Skull and upper cervical spine: Negative Sinuses/Orbits: Clear/normal Other: none significant IMPRESSION: Background pattern of brain atrophy with extensive chronic small-vessel ischemic  changes throughout the brain. 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 Punctate acute infarction in the left posterior temporal lobe/occipital lobe junction region. Electronically Signed   By: Paulina Fusi M.D.   On: 12/09/2016 12:14   Ct Head Code Stroke W/o Cm  Result Date: 12/10/2016 CLINICAL DATA:  Code stroke. Rule out stroke. Altered mental status. EXAM: CT HEAD WITHOUT CONTRAST TECHNIQUE: Contiguous axial images were obtained from the base of the skull through the vertex without intravenous contrast. COMPARISON:  12/08/2016 CT head.  12/09/2016 MRI head. FINDINGS: Brain: No evidence of acute infarction, hemorrhage, hydrocephalus, extra-axial collection or mass lesion/mass effect. Stable chronic infarcts within the bilateral anterior basal ganglia, advanced chronic microvascular ischemic changes of the brain, and moderate parenchymal volume loss. Vascular: No hyperdense vessel identified. Calcific atherosclerosis of carotid siphons and vertebral arteries. Skull: Normal. Negative for fracture or focal lesion. Sinuses/Orbits: No acute finding. Other: None. ASPECTS [facility] Stroke Program Early CT Score) - Ganglionic level infarction (caudate, lentiform nuclei, internal capsule, insula, M1-M3 cortex): 7 - Supraganglionic infarction (M4-M6 cortex): 3 Total score (0-10 with 10 being normal): 10 IMPRESSION: 1. No acute intracranial abnormality identified. 2. ASPECTS is 10 3. Stable advanced chronic microvascular ischemic changes of white matter, moderate parenchymal volume loss of the brain, and chronic lacunar infarcts within the anterior basal ganglia bilaterally. These results were called by telephone at the time of interpretation on 12/10/2016 at 8:04 pm to Dr. Gracy Racer, who verbally acknowledged these results. Electronically Signed   By: Mitzi Hansen M.D.   On: 12/10/2016 20:05    Microbiology: Recent Results (from the past 240 hour(s))  MRSA PCR Screening     Status: None   Collection  Time: 12/08/16  8:58 PM  Result Value Ref Range Status   MRSA by PCR NEGATIVE NEGATIVE Final    Comment:        The GeneXpert MRSA Assay (FDA approved for NASAL specimens only), is one component of a comprehensive MRSA colonization surveillance program. It is not intended to diagnose MRSA infection nor to guide or monitor treatment for MRSA infections.      Labs: Basic Metabolic Panel:  Recent Labs Lab 12/09/16 0522 12/10/16 0554 12/10/16 1936 12/10/16 1939 12/11/16 0527 12/13/16 0902  NA 144 144 138 140 134* 140  K 3.3* 3.8 4.2 4.4 3.8 3.6  CL 106 107 101 104 100* 104  CO2 30 28 22   --  25 28  GLUCOSE 98 104* 123* 128* 103* 132*  BUN 15 15 24* 33* 22* 16  CREATININE 0.94 0.94 1.86* 1.90* 1.15* 1.06*  CALCIUM 9.4 9.6 9.6  --  9.1 9.6  MG  1.8 1.9  --   --   --   --    Liver Function Tests:  Recent Labs Lab 12/10/16 1936  AST 23  ALT 14  ALKPHOS 66  BILITOT 0.5  PROT 6.5  ALBUMIN 3.9   No results for input(s): LIPASE, AMYLASE in the last 168 hours. No results for input(s): AMMONIA in the last 168 hours. CBC:  Recent Labs Lab 12/08/16 1132 12/09/16 0522 12/10/16 1936 12/10/16 1939 12/11/16 0527  WBC 8.9 8.0 11.1*  --  8.8  NEUTROABS  --   --  5.8  --   --   HGB 13.2 13.3 14.2 15.3* 13.5  HCT 40.2 40.7 43.9 45.0 41.3  MCV 91.6 90.6 91.6  --  89.8  PLT 270 298 290  --  298   Cardiac Enzymes: No results for input(s): CKTOTAL, CKMB, CKMBINDEX, TROPONINI in the last 168 hours. BNP: BNP (last 3 results) No results for input(s): BNP in the last 8760 hours.  ProBNP (last 3 results) No results for input(s): PROBNP in the last 8760 hours.  CBG:  Recent Labs Lab 12/08/16 1137 12/10/16 0558 12/10/16 2001  GLUCAP 118* 113* 155*       Signed:  Skya Mccullum MD.  [facility] 12/13/2016, 4:14 PM

## 2016-12-13 NOTE — Clinical Social Work Note (Signed)
Clinical Social Work Assessment  Patient Details  Name: Amber Rollins MRN: [id number] Date of Birth: [birth year]  Date of referral:  12/13/16               Reason for consult:  Facility Placement, Discharge Planning                Permission sought to share information with:  Facility Medical sales representativeContact Representative, Family Supports Permission granted to share information::  Yes, Verbal Permission Granted  Name::     Amber Rollins  Agency::  [facility]  Relationship::  Daughter  Contact Information:     Housing/Transportation Living arrangements for the past 2 months:  Assisted Living Facility Source of Information:  Patient, Adult Children Patient Interpreter Needed:  None Criminal Activity/Legal Involvement Pertinent to Current Situation/Hospitalization:  No - Comment as needed Significant Relationships:  Adult Children, Siblings Lives with:  Self, Facility Resident Do you feel safe going back to the place where you live?  Yes Need for family participation in patient care:  Yes (Comment) (patient has dementia; not fully oriented)  Care giving concerns:  Patient from [facility] and reports no concerns about care received.   Social Worker assessment / plan:  CSW introduced self to patient and discussed role. [facility] and that her children don't think that she can live on her own anymore. Patient requested that CSW contact her daughter, Amber Rollins. CSW contacted patient's daughter, Amber Rollins, by phone and discussed discharge plan. Patient's daughter confirmed plan for [facility]. [facility] and confirmed documentation that the facility will need at discharge for the patient to return to ALF. CSW will follow to facilitate discharge when medically ready.  Employment status:  Retired Database administratornsurance information:  Managed Medicare PT Recommendations:  Home with Home  Health Information / Referral to community resources:     Patient/Family's Response to care:  Patient and daughter agreeable to return to ALF.  Patient/Family's Understanding of and Emotional Response to Diagnosis, Current Treatment, and Prognosis:  Patient and daughter seem to understand patient's current needs and diagnoses. Patient and daughter indicated understanding of CSW role in discharge planning.  Emotional Assessment Appearance:  Appears stated age Attitude/Demeanor/Rapport:    Affect (typically observed):  Appropriate Orientation:  Oriented to Self Alcohol / Substance use:  Not Applicable Psych involvement (Current and /or in the community):  No (Comment)  Discharge Needs  Concerns to be addressed:  Care Coordination, Discharge Planning Concerns Readmission within the last 30 days:  Yes Current discharge risk:  Cognitively Impaired, Physical Impairment Barriers to Discharge:  Continued Medical Work up   Dollar GeneralElizabeth M Synai Prettyman, LCSW 12/13/2016, 11:22 AM

## 2016-12-14 ENCOUNTER — Encounter: Payer: Self-pay | Admitting: Neurology

## 2016-12-14 ENCOUNTER — Emergency Department (HOSPITAL_COMMUNITY)
Admission: EM | Admit: 2016-12-14 | Discharge: 2016-12-14 | Disposition: A | Discharge status: Home / Self Care | Payer: PPO | Attending: Emergency Medicine | Admitting: Emergency Medicine

## 2016-12-14 ENCOUNTER — Telehealth: Payer: Self-pay

## 2016-12-14 ENCOUNTER — Encounter (HOSPITAL_COMMUNITY): Payer: Self-pay | Admitting: Emergency Medicine

## 2016-12-14 DIAGNOSIS — R0902 Hypoxemia: Secondary | ICD-10-CM

## 2016-12-14 DIAGNOSIS — R569 Unspecified convulsions: Secondary | ICD-10-CM

## 2016-12-14 DIAGNOSIS — R55 Syncope and collapse: Secondary | ICD-10-CM | POA: Diagnosis not present

## 2016-12-14 DIAGNOSIS — Z91040 Latex allergy status: Secondary | ICD-10-CM | POA: Insufficient documentation

## 2016-12-14 DIAGNOSIS — I10 Essential (primary) hypertension: Secondary | ICD-10-CM | POA: Insufficient documentation

## 2016-12-14 DIAGNOSIS — F0390 Unspecified dementia without behavioral disturbance: Secondary | ICD-10-CM

## 2016-12-14 DIAGNOSIS — Z79899 Other long term (current) drug therapy: Secondary | ICD-10-CM | POA: Insufficient documentation

## 2016-12-14 DIAGNOSIS — Z7982 Long term (current) use of aspirin: Secondary | ICD-10-CM | POA: Diagnosis not present

## 2016-12-14 DIAGNOSIS — N390 Urinary tract infection, site not specified: Secondary | ICD-10-CM

## 2016-12-14 DIAGNOSIS — R627 Adult failure to thrive: Secondary | ICD-10-CM

## 2016-12-14 DIAGNOSIS — R404 Transient alteration of awareness: Secondary | ICD-10-CM | POA: Diagnosis not present

## 2016-12-14 LAB — CBC WITH DIFFERENTIAL/PLATELET
BASOS PCT: 0 %
Basophils Absolute: 0 10*3/uL (ref 0.0–0.1)
EOS ABS: 0.2 10*3/uL (ref 0.0–0.7)
EOS PCT: 1 %
HCT: 45.2 % (ref 36.0–46.0)
HEMOGLOBIN: 14.8 g/dL (ref 12.0–15.0)
Lymphocytes Relative: 15 %
Lymphs Abs: 1.6 10*3/uL (ref 0.7–4.0)
MCH: 29.9 pg (ref 26.0–34.0)
MCHC: 32.7 g/dL (ref 30.0–36.0)
MCV: 91.3 fL (ref 78.0–100.0)
MONOS PCT: 8 %
Monocytes Absolute: 0.9 10*3/uL (ref 0.1–1.0)
NEUTROS PCT: 76 %
Neutro Abs: 8.3 10*3/uL — ABNORMAL HIGH (ref 1.7–7.7)
PLATELETS: 311 10*3/uL (ref 150–400)
RBC: 4.95 MIL/uL (ref 3.87–5.11)
RDW: 14.8 % (ref 11.5–15.5)
WBC: 10.9 10*3/uL — ABNORMAL HIGH (ref 4.0–10.5)

## 2016-12-14 LAB — URINALYSIS, ROUTINE W REFLEX MICROSCOPIC
Bilirubin Urine: NEGATIVE
Glucose, UA: NEGATIVE mg/dL
Ketones, ur: 5 mg/dL — AB
NITRITE: POSITIVE — AB
PROTEIN: 30 mg/dL — AB
SPECIFIC GRAVITY, URINE: 1.014 (ref 1.005–1.030)
pH: 6 (ref 5.0–8.0)

## 2016-12-14 LAB — BASIC METABOLIC PANEL
Anion gap: 7 (ref 5–15)
BUN: 24 mg/dL — ABNORMAL HIGH (ref 6–20)
CALCIUM: 9.7 mg/dL (ref 8.9–10.3)
CO2: 27 mmol/L (ref 22–32)
CREATININE: 1.29 mg/dL — AB (ref 0.44–1.00)
Chloride: 104 mmol/L (ref 101–111)
GFR, EST AFRICAN AMERICAN: 43 mL/min — AB (ref >60)
GFR, EST NON AFRICAN AMERICAN: 37 mL/min — AB (ref >60)
Glucose, Bld: 93 mg/dL (ref 65–99)
Potassium: 3.2 mmol/L — ABNORMAL LOW (ref 3.5–5.1)
SODIUM: 138 mmol/L (ref 135–145)

## 2016-12-14 MED ORDER — LACOSAMIDE 50 MG PO TABS
100.0000 mg | ORAL_TABLET | Freq: Once | ORAL | Status: AC
  Administered 2016-12-14: 100 mg via ORAL
  Filled 2016-12-14 (×2): qty 2

## 2016-12-14 MED ORDER — CEPHALEXIN 250 MG PO CAPS
500.0000 mg | ORAL_CAPSULE | Freq: Once | ORAL | Status: AC
  Administered 2016-12-14: 500 mg via ORAL
  Filled 2016-12-14: qty 2

## 2016-12-14 MED ORDER — LACOSAMIDE 50 MG PO TABS: 100.0000 mg | ORAL_TABLET | Freq: Two times a day (BID) | ORAL | 1 refills | Status: DC

## 2016-12-14 MED ORDER — SODIUM CHLORIDE 0.9 % IV BOLUS (SEPSIS)
500.0000 mL | Freq: Once | INTRAVENOUS | Status: AC
  Administered 2016-12-14: 500 mL via INTRAVENOUS

## 2016-12-14 MED ORDER — CEPHALEXIN 500 MG PO CAPS: 500.0000 mg | ORAL_CAPSULE | Freq: Two times a day (BID) | ORAL | 0 refills | Status: DC

## 2016-12-14 NOTE — ED Triage Notes (Signed)
[facility]  Facility reports finding patient unresponsive seated in a chair.  Patient recently diagnosed with seizures, EMS reports patient appearing post ictal on scene.  Patient alert and oriented to self and location at this time.  New appearing abrasions noted on left posterior of head.  20g saline lock in left AC.

## 2016-12-14 NOTE — Telephone Encounter (Signed)
Left message on daughter's Jamesetta So(Phyllis)  voicemail to return call.

## 2016-12-14 NOTE — Telephone Encounter (Signed)
Please contact a family member and see if they are in agreement with this - If so/ I can do the referral and will still plan to see her on the 23rd

## 2016-12-14 NOTE — Telephone Encounter (Signed)
[facility] left v/m (no individual caller name left) requesting a referral for hospice due to change in pts condition; pt has had had 3 seizures and was admitted to hospital x 2; pt has been returned to facility and pt is unable to do ADLs and is highly confused.Please advise. [facility]. Pt has appt with Dr Milinda Antisower on 12/20/16.

## 2016-12-14 NOTE — ED Notes (Signed)
Phlebotomy at bedside.

## 2016-12-14 NOTE — Discharge Instructions (Signed)
You have a UTI today. Take keflex as prescribed.  STOP taking tramadol as it lowers the seizure threshold.  Our neurologist recommends that you take 100 mg of the Vimpat/Lacosamide two times daily for the therapeutic dose.  Follow-up with the neurologist as scheduled.  Return for worsening symptoms, including confusion, fever, multiple back to back seizures, seizure > 5 minutes, or any other symptoms concerning to you.  Please see your PCP as scheduled on Monday. Please hold your metoprolol until you see your PCP as your HR is in the 40-50s this morning due to it. You may need to decrease your dosage.

## 2016-12-14 NOTE — ED Provider Notes (Signed)
[facility] DEPT Provider Note   CSN: [id number] Arrival date & time: 12/14/16  1007     History   Chief Complaint Chief Complaint  Patient presents with  . Loss of Consciousness    HPI Amber Rollins is a 83 y.o. female.  HPI 83 year old female who presents with episode of loss of consciousness. She has a history of mild dementia, hypertension, hyperlipidemia. Presents from nursing facility. Was just discharged from the hospital yesterday from hospitalization, and diagnosed with complex partial seizures. That time was admitted once before for syncopal episodes, and had imaging studies performed as well as echo and MRI. She had a small lacunar infarct that time. She was started on Vimpat during the most recent admission and discharge back to nursing home. Today was noted to be sitting for lunch when she had blinking of her eyes, and then loss of consciousness. She became hypoxic, requiring oxygen. Episode lasted for about 10 minutes, and then she began to regain consciousness. she was confused prior to arrival to the ED. She denies any chest pain, fevers, difficulty breathing, near syncopal symptoms. Her daughter witnessed the episode and reports that his has been the same since she was hospitalized as her seizures. Past Medical History:  Diagnosis Date  . Chronic foot pain    mortons neuroma  . Complication of anesthesia   . Cystocele 01/01   neg. sx  . fracture fibula ? 2003   left  . GERD (gastroesophageal reflux disease)   . Hemorrhoids   . Hyperlipemia 05/31/1992  . Hypertension 05/31/2000  . Menopausal symptoms    vasomotor symptoms-severe  . Osteoarthritis   . Osteopenia 11/29/1999  . Pedal edema    worsened by Norvasc  . Plantar fasciitis   . PONV (postoperative nausea and vomiting)     Patient Active Problem List   Diagnosis Date Noted  . Complex partial seizure (HCC) 12/11/2016  . Dementia 12/11/2016  . Seizure (HCC) 12/11/2016  . Acute  encephalopathy 12/10/2016  . Syncope 12/08/2016  . Altered mental state 10/12/2016  . Foot pain, bilateral 08/23/2016  . Diarrhea 03/29/2016  . Hyponatremia 01/01/2016  . Routine general medical examination at a health care facility 11/10/2015  . Urinary frequency 10/23/2014  . Generalized weakness 10/23/2014  . Hammer toe 09/20/2014  . Herpetic dermatitis 08/09/2014  . Poor balance 04/11/2013  . Thoracic back pain 04/11/2013  . Urinary incontinence 12/27/2012  . Thoracic kyphosis 09/24/2012  . Fall 08/09/2012  . Hemorrhoid 12/24/2011  . Dysuria 12/24/2011  . Low back pain 12/24/2011  . Hypokalemia 10/06/2010  . HEMATURIA UNSPECIFIED 10/02/2009  . NIGHT SWEATS 10/02/2009  . GERD 06/18/2009  . INCONTINENCE, URGE 06/18/2009  . FATIGUE 03/12/2009  . HYPOKALEMIA 10/08/2008  . MENOPAUSAL SYNDROME 01/22/2008  . EDEMA 07/19/2007  . Anxiety and depression 06/21/2007  . FOOT PAIN, CHRONIC 03/06/2007  . ALLERGIC RHINITIS, SEASONAL 09/12/2006  . IRRITABLE BOWEL SYNDROME 09/12/2006  . FIBROCYSTIC BREAST DISEASE 09/12/2006  . OSTEOARTHRITIS 09/12/2006  . HEMORRHOIDS, HX OF 09/12/2006  . Essential hypertension 05/31/2000  . Disorder of bone and cartilage 11/29/1999  . Hyperlipidemia 05/31/1992    Past Surgical History:  Procedure Laterality Date  . Anterior repari w//surg proc  06/30/99   RSO  . BLADDER SUSPENSION    . BLADDER TACK    . CATARACTS    . Foot problems     neuromas  . history of CT     mass right adnexa, U?S by GYN-observation  . KYPHOPLASTY N/A 09/04/2012  Procedure: T7, T8,T12 Kyphoplasty ;  Surgeon: Clydene Fake, MD;  Location: [facility];  Service: Neurosurgery;  Laterality: N/A;  thoracic seven,eight, and twelve   . PARTIAL HYSTERECTOMY  1970   endometriosis    OB History    No data available       Home Medications    Prior to Admission medications   Medication Sig Start Date End Date Taking? Authorizing Provider  aspirin 81 MG chewable tablet  Chew 1 tablet (81 mg total) by mouth daily. 12/11/16  Yes Albertine Grates, MD  azelastine (ASTELIN) 0.1 % nasal spray PLACE 2 SPRAYS INTO BOTH NOSTRILS 2 TIMES DAILY. 02/27/16  Yes Tower, Audrie Gallus, MD  busPIRone (BUSPAR) 15 MG tablet Take 1 tablet (15 mg total) by mouth 2 (two) times daily. 12/13/16  Yes Rodolph Bong, MD  Cholecalciferol (VITAMIN D3) 2000 UNITS TABS Take 1 tablet by mouth daily with breakfast.    Yes [provider]  fluticasone (FLONASE) 50 MCG/ACT nasal spray PLACE 1 TO 2 SPRAYS IN EACH NOSTRIL DAILY. Patient taking differently: Instill 1-2 sprays into each nostril once a day 02/27/16  Yes Tower, Audrie Gallus, MD  metoprolol tartrate (LOPRESSOR) 25 MG tablet Take 0.5 tablets (12.5 mg total) by mouth 2 (two) times daily. Patient taking differently: Take 25 mg by mouth 2 (two) times daily.  12/10/16 12/10/17 Yes Albertine Grates, MD  omega-3 acid ethyl esters (LOVAZA) 1 g capsule Take 1 capsule (1 g total) by mouth daily. 12/10/16  Yes Albertine Grates, MD  omeprazole (PRILOSEC) 20 MG capsule TAKE 1 CAPSULE BY MOUTH 2 TIMES DAILY. 06/21/16  Yes Tower, Audrie Gallus, MD  acetaminophen (TYLENOL) 325 MG tablet Take 650 mg by mouth every 4 (four) hours as needed (for pain).     [provider]  cephALEXin (KEFLEX) 500 MG capsule Take 1 capsule (500 mg total) by mouth 2 (two) times daily. 12/14/16   Lavera Guise, MD  lacosamide (VIMPAT) 50 MG TABS tablet Take 2 tablets (100 mg total) by mouth 2 (two) times daily. 12/14/16   Lavera Guise, MD  mometasone (ELOCON) 0.1 % cream Apply to affected area in small amount 3 times weekly as needed Patient taking differently: Apply 1 application topically See admin instructions. Three times a week to affected area(s) as needed 11/10/15   Tower, Audrie Gallus, MD    Family History Family History  Problem Relation Age of Onset  . Diabetes Brother   . Hypertension Other   . Diabetes Mother   . Heart disease Mother        pacemaker  . Hypertension Mother   . COPD Father    . Diabetes Sister   . Mental illness Son        anxiety-mental health problems  . Diabetes Brother   . Diabetes Sister   . Diabetes Sister     Social History Social History  Substance Use Topics  . Smoking status: Never Smoker  . Smokeless tobacco: Never Used  . Alcohol use No     Allergies   Metronidazole; Penicillins; Tdap [diphth-acell pertussis-tetanus]; Amlodipine besylate; Codeine; Hydrocodone; Lovastatin; Oxycodone; Sertraline hcl; Simvastatin; Tape; Latex; and Sulfonamide derivatives   Review of Systems Review of Systems  Constitutional: Negative for fever.  Respiratory: Negative for cough and shortness of breath.   Cardiovascular: Negative for chest pain.  Neurological: Positive for seizures. Negative for weakness and numbness.  All other systems reviewed and are negative.    Physical Exam Updated Vital  Signs BP (!) 185/87   Pulse 62   Temp 97.8 F (36.6 C) (Oral)   Resp (!) 22   SpO2 99%   Physical Exam Physical Exam  Nursing note and vitals reviewed. Constitutional: Well developed, well nourished, non-toxic, and in no acute distress Head: Normocephalic and atraumatic.  Mouth/Throat: Oropharynx is clear and moist.  Neck: Normal range of motion. Neck supple.  Cardiovascular: Normal rate and regular rhythm.   Pulmonary/Chest: Effort normal and breath sounds normal.  Abdominal: Soft. There is no tenderness. There is no rebound and no guarding.  Musculoskeletal: Normal range of motion.  Neurological: Alert, PERRL, no facial droop, fluent speech, moves all extremities symmetrically, sensation to light touch in tact throughout Skin: Skin is warm and dry.  Psychiatric: Cooperative   ED Treatments / Results  Labs (all labs ordered are listed, but only abnormal results are displayed) Labs Reviewed  CBC WITH DIFFERENTIAL/PLATELET - Abnormal; Notable for the following:       Result Value   WBC 10.9 (*)    Neutro Abs 8.3 (*)    All other components  within normal limits  BASIC METABOLIC PANEL - Abnormal; Notable for the following:    Potassium 3.2 (*)    BUN 24 (*)    Creatinine, Ser 1.29 (*)    GFR calc non Af Amer 37 (*)    GFR calc Af Amer 43 (*)    All other components within normal limits  URINALYSIS, ROUTINE W REFLEX MICROSCOPIC - Abnormal; Notable for the following:    APPearance CLOUDY (*)    Hgb urine dipstick SMALL (*)    Ketones, ur 5 (*)    Protein, ur 30 (*)    Nitrite POSITIVE (*)    Leukocytes, UA LARGE (*)    Bacteria, UA RARE (*)    Squamous Epithelial / LPF 0-5 (*)    Non Squamous Epithelial 0-5 (*)    All other components within normal limits  URINE CULTURE    EKG  EKG Interpretation  Date/Time:  Tuesday December 14 2016 10:55:00 EDT Ventricular Rate:  49 PR Interval:    QRS Duration: 88 QT Interval:  459 QTC Calculation: 415 R Axis:   4 Text Interpretation:  Sinus bradycardia LVH with secondary repolarization abnormality rate slowler, but no acute changes Confirmed by Crista Curb [phone]) on 12/14/2016 12:41:38 PM       Radiology No results found.  Procedures Procedures (including critical care time)  Medications Ordered in ED Medications  sodium chloride 0.9 % bolus 500 mL (0 mLs Intravenous Stopped 12/14/16 1415)  lacosamide (VIMPAT) tablet 100 mg (100 mg Oral Given 12/14/16 1117)  cephALEXin (KEFLEX) capsule 500 mg (500 mg Oral Given 12/14/16 1455)     Initial Impression / Assessment and Plan / ED Course  I have reviewed the triage vital signs and the nursing notes.  Pertinent labs & imaging results that were available during my care of the patient were reviewed by me and considered in my medical decision making (see chart for details).     Records reviewed. Patient stopped the hospital yesterday after episodes of LOC, that were subsequently determined to be complex partial seizures. Her presentation today is very similar to what was described previously, this appeared that she is having  breakthrough seizure. Discussed with Dr. Amada Jupiter from neurology. She is currently on a non-therapeutic dose of lacosamide, and he recommended titrating to 100 mg twice a day for therapeutic dosage. She is also taking tramadol which can lower  the seizure threshold. I have discontinued this from her medication list.  She has no further seizures in the ED, and has normal mentation while here in the ED. She does have mild bradycardia here with heart rate in the 50s. This is sinus bradycardia, and she is asymptomatic. Family states that she was just started on metoprolol, and I think this is the likely etiology. She is otherwise been normotensive and I do not think that this is cause of LOC.  Blood work otherwise reassuring. She does have evidence of a UTI, we will treat with Keflex. No evidence of sepsis or systemic illness. She is felt to be stable for discharge back to facility and after speaking with patient and daughter they would really prefer to not be admitted she has had multiple admissions this month.  Strict return and follow-up instructions reviewed. They expressed understanding of all discharge instructions and felt comfortable with the plan of care.   Final Clinical Impressions(s) / ED Diagnoses   Final diagnoses:  Lower urinary tract infectious disease  Seizure (HCC)    New Prescriptions New Prescriptions   CEPHALEXIN (KEFLEX) 500 MG CAPSULE    Take 1 capsule (500 mg total) by mouth 2 (two) times daily.     Lavera GuiseLiu, Jabarri Stefanelli Duo, MD 12/14/16 [phone]

## 2016-12-15 DIAGNOSIS — R627 Adult failure to thrive: Secondary | ICD-10-CM | POA: Insufficient documentation

## 2016-12-15 DIAGNOSIS — R0902 Hypoxemia: Secondary | ICD-10-CM | POA: Insufficient documentation

## 2016-12-15 NOTE — Telephone Encounter (Signed)
[facility] request cb about status of hospice referral.

## 2016-12-15 NOTE — Telephone Encounter (Signed)
Hospice ref done Can be changed to external if needed [facility] notes have more details

## 2016-12-15 NOTE — Telephone Encounter (Signed)
Spoke with daughter and she was okay with Hospice referral but she was confused on why pt needed Hospice eval when she spoke with Aniceto BossAlma Sales promotion account executive(director) at nursing home and she was just saying pt needs oxygen and that's it.  I called the [facility] back and spoke to Red Oakeresa and she advise me that pt is really declining in health and they do recommend the Hospice eval because if she has Hospice then they can call them instead of sending her to the ER every time she passes out or has an emergent problem. Pt has been passing out a lot lately. The oxygen came into the conversation because last time they called EMS for pt passing out yesterday her 02 stat was 92 and the EMS placed oxygen on her and she perked up a little. Since pt returned from the ER she has been very lethargic, she has been passing out and having seizures and can't complete her ADLs.  Rosey Batheresa will have Alma Sales promotion account executive(director) call pt's daughter and answer any questions or concerns she has with pt's care but given pt's decline Rosey Batheresa is requesting order for Hospice [facility]. Teresa request that we fax the order to their fax # [phone]

## 2016-12-15 NOTE — Telephone Encounter (Signed)
Called son's # to see if family agrees with referral and person who answered said I have the wrong #, called daughter's # and no answer again so I left voicemail requesting daughter to call the office back  I called the [facility] to see if maybe they have talked to the family about this referral but no one answered the # it just kept ringing

## 2016-12-16 ENCOUNTER — Telehealth: Payer: Self-pay

## 2016-12-16 LAB — URINE CULTURE: Culture: >=100000 — AB

## 2016-12-16 NOTE — Telephone Encounter (Signed)
[facility]. I also called them to ensure they received. Rosey Batheresa is out of the office but they will give Referral to Rickey PrimusAlma Love Executive Director and she will take care of calling [facility] today. I will try to contact the family also to let them know Hospice Referral was sent.

## 2016-12-16 NOTE — Telephone Encounter (Signed)
Left a VM wanting to know if you are going to be the attending provider of record for hospice for patient?

## 2016-12-16 NOTE — Telephone Encounter (Signed)
Verbal order given to Melissa.  

## 2016-12-16 NOTE — Telephone Encounter (Signed)
Melissa RN case mgr with [facility] left v/m requesting verbal orders for resumption of service [facility] and any other services Dr Milinda Antisower would want to order.

## 2016-12-16 NOTE — Telephone Encounter (Signed)
Please verbally ok those orders 

## 2016-12-16 NOTE — Telephone Encounter (Signed)
Yes- if her family is ok with that, thanks

## 2016-12-17 ENCOUNTER — Telehealth: Payer: Self-pay

## 2016-12-17 DIAGNOSIS — G5763 Lesion of plantar nerve, bilateral lower limbs: Secondary | ICD-10-CM | POA: Diagnosis not present

## 2016-12-17 DIAGNOSIS — M40204 Unspecified kyphosis, thoracic region: Secondary | ICD-10-CM | POA: Diagnosis not present

## 2016-12-17 DIAGNOSIS — M1991 Primary osteoarthritis, unspecified site: Secondary | ICD-10-CM | POA: Diagnosis not present

## 2016-12-17 DIAGNOSIS — K589 Irritable bowel syndrome without diarrhea: Secondary | ICD-10-CM | POA: Diagnosis not present

## 2016-12-17 DIAGNOSIS — F419 Anxiety disorder, unspecified: Secondary | ICD-10-CM | POA: Diagnosis not present

## 2016-12-17 DIAGNOSIS — Z9181 History of falling: Secondary | ICD-10-CM | POA: Diagnosis not present

## 2016-12-17 DIAGNOSIS — I10 Essential (primary) hypertension: Secondary | ICD-10-CM | POA: Diagnosis not present

## 2016-12-17 DIAGNOSIS — F329 Major depressive disorder, single episode, unspecified: Secondary | ICD-10-CM | POA: Diagnosis not present

## 2016-12-17 DIAGNOSIS — N6019 Diffuse cystic mastopathy of unspecified breast: Secondary | ICD-10-CM | POA: Diagnosis not present

## 2016-12-17 NOTE — Telephone Encounter (Signed)
Ivette notified of Dr. Royden Purlower's comments

## 2016-12-17 NOTE — Telephone Encounter (Signed)
Post ED Visit - Positive Culture Follow-up  Culture report reviewed by antimicrobial stewardship pharmacist:  []  Enzo BiNathan Batchelder, Pharm.D. []  Celedonio MiyamotoJeremy Frens, Pharm.D., BCPS AQ-ID []  Garvin FilaMike Maccia, Pharm.D., BCPS []  Georgina PillionElizabeth Martin, Pharm.D., BCPS []  AtkinsMinh Pham, VermontPharm.D., BCPS, AAHIVP [x]  Estella HuskMichelle Turner, Pharm.D., BCPS, AAHIVP []  Lysle Pearlachel Rumbarger, PharmD, BCPS []  Casilda Carlsaylor Stone, PharmD, BCPS []  Pollyann SamplesAndy Johnston, PharmD, BCPS  Positive urine culture Treated with Cephalexin, organism sensitive to the same and no further patient follow-up is required at this time.  Jerry CarasCullom, Chaneka Trefz Burnett 12/17/2016, 12:55 PM

## 2016-12-20 ENCOUNTER — Telehealth: Payer: Self-pay | Admitting: *Deleted

## 2016-12-20 ENCOUNTER — Telehealth: Payer: Self-pay | Admitting: Cardiology

## 2016-12-20 ENCOUNTER — Ambulatory Visit (INDEPENDENT_AMBULATORY_CARE_PROVIDER_SITE_OTHER): Admitting: Family Medicine

## 2016-12-20 ENCOUNTER — Encounter: Payer: Self-pay | Admitting: Family Medicine

## 2016-12-20 VITALS — BP 116/64 | HR 54 | Temp 97.4°F | Ht 59.25 in

## 2016-12-20 DIAGNOSIS — F0390 Unspecified dementia without behavioral disturbance: Secondary | ICD-10-CM

## 2016-12-20 DIAGNOSIS — M1991 Primary osteoarthritis, unspecified site: Secondary | ICD-10-CM | POA: Diagnosis not present

## 2016-12-20 DIAGNOSIS — R531 Weakness: Secondary | ICD-10-CM

## 2016-12-20 DIAGNOSIS — E7800 Pure hypercholesterolemia, unspecified: Secondary | ICD-10-CM

## 2016-12-20 DIAGNOSIS — F329 Major depressive disorder, single episode, unspecified: Secondary | ICD-10-CM

## 2016-12-20 DIAGNOSIS — N3000 Acute cystitis without hematuria: Secondary | ICD-10-CM | POA: Diagnosis not present

## 2016-12-20 DIAGNOSIS — R627 Adult failure to thrive: Secondary | ICD-10-CM | POA: Diagnosis not present

## 2016-12-20 DIAGNOSIS — G8929 Other chronic pain: Secondary | ICD-10-CM

## 2016-12-20 DIAGNOSIS — G40209 Localization-related (focal) (partial) symptomatic epilepsy and epileptic syndromes with complex partial seizures, not intractable, without status epilepticus: Secondary | ICD-10-CM

## 2016-12-20 DIAGNOSIS — Z9181 History of falling: Secondary | ICD-10-CM | POA: Diagnosis not present

## 2016-12-20 DIAGNOSIS — K589 Irritable bowel syndrome without diarrhea: Secondary | ICD-10-CM | POA: Diagnosis not present

## 2016-12-20 DIAGNOSIS — R2689 Other abnormalities of gait and mobility: Secondary | ICD-10-CM | POA: Diagnosis not present

## 2016-12-20 DIAGNOSIS — I10 Essential (primary) hypertension: Secondary | ICD-10-CM

## 2016-12-20 DIAGNOSIS — K219 Gastro-esophageal reflux disease without esophagitis: Secondary | ICD-10-CM

## 2016-12-20 DIAGNOSIS — J301 Allergic rhinitis due to pollen: Secondary | ICD-10-CM | POA: Diagnosis not present

## 2016-12-20 DIAGNOSIS — G5763 Lesion of plantar nerve, bilateral lower limbs: Secondary | ICD-10-CM | POA: Diagnosis not present

## 2016-12-20 DIAGNOSIS — M40204 Unspecified kyphosis, thoracic region: Secondary | ICD-10-CM | POA: Diagnosis not present

## 2016-12-20 DIAGNOSIS — F419 Anxiety disorder, unspecified: Secondary | ICD-10-CM

## 2016-12-20 DIAGNOSIS — N6019 Diffuse cystic mastopathy of unspecified breast: Secondary | ICD-10-CM | POA: Diagnosis not present

## 2016-12-20 DIAGNOSIS — N390 Urinary tract infection, site not specified: Secondary | ICD-10-CM | POA: Insufficient documentation

## 2016-12-20 DIAGNOSIS — M546 Pain in thoracic spine: Secondary | ICD-10-CM | POA: Diagnosis not present

## 2016-12-20 DIAGNOSIS — Z8673 Personal history of transient ischemic attack (TIA), and cerebral infarction without residual deficits: Secondary | ICD-10-CM

## 2016-12-20 MED ORDER — PHENOBARBITAL 60 MG PO TABS: 60.0000 mg | ORAL_TABLET | Freq: Two times a day (BID) | ORAL | 5 refills | Status: AC

## 2016-12-20 NOTE — Assessment & Plan Note (Signed)
Ongoing with hx of TS fractures  Not as bad as prev  Has tramadol ordered at [facility] but has not used it

## 2016-12-20 NOTE — Telephone Encounter (Signed)
Verbal order given  

## 2016-12-20 NOTE — Assessment & Plan Note (Signed)
Appetite is improving and wt is up with inc po intake now

## 2016-12-20 NOTE — Patient Instructions (Signed)
Change Vimpat to phenobarbital  I put the order in for [facility] Proceed with hospice  Proceed with PT as ordered

## 2016-12-20 NOTE — Assessment & Plan Note (Addendum)
This is progressive/especially after cva With age and current condition pt and family are interested in comfort care primarily  In hospice now Will hold of on medications for dementia  Continues buspar for anxiety

## 2016-12-20 NOTE — Assessment & Plan Note (Signed)
Treated in ED with keflex Reviewed hospital records, lab results and studies in detail   E coli - sensitive  Feeling better Continues baseline urinary incontinence

## 2016-12-20 NOTE — Assessment & Plan Note (Signed)
Will cut out astelin ns and keep flonase  If symptoms worsen-can add it back  Pt in agreement  Trying to minimize meds when able

## 2016-12-20 NOTE — Progress Notes (Signed)
Subjective:    Patient ID: Amber Rollins, female    DOB: 1930-12-20, 83 y.o.   MRN: [id number]  HPI  Here for f/u of chronic health problems and hospitalizations   She was  hosp from 7/13 to 7/16 for complex partial seizure - with witnessed LOC and eye flickering w/o urine incontinence  She did have post ictal confusion  Hx of small lacunar infarct  She was started on Vimpat for seizure  CT was nl   During that admit -disc staying on meds for comfort /quality of life  Not on dementia medication  zetia was stopped  bp remained in control  Neuro ref made   She was again seen in ED 7/17 for another episode/seizure that lasted 10 minutes with some hypoxia   UA was positive and wbc up and she was tx with keflex for uti  Urine cx was pos and sensitive to keflex  (e coli)   Neuro inc her dose of lacosamide to 100 mg bid  Mild bradycardia noted  No further episode   Since then her residence/[facility] ref as well as home health PT  Is currently at [facility]  Noted-she has been progressively declining   Wt Readings from Last 3 Encounters:  12/11/16 135 lb 11.2 oz (61.6 kg)  12/08/16 127 lb (57.6 kg)  10/18/16 127 lb 12 oz (57.9 kg)  eating well and craving sweets - so eating more  Not drinking enough fluids       Chemistry      Component Value Date/Time   NA 138 12/14/2016 1157   NA 142 09/17/2016   K 3.2 (L) 12/14/2016 1157   CL 104 12/14/2016 1157   CO2 27 12/14/2016 1157   BUN 24 (H) 12/14/2016 1157   BUN 12 09/17/2016   CREATININE 1.29 (H) 12/14/2016 1157   CREATININE 1.03 12/24/2011 1514   GLU 99 09/17/2016      Component Value Date/Time   CALCIUM 9.7 12/14/2016 1157   ALKPHOS 66 12/10/2016 1936   AST 23 12/10/2016 1936   ALT 14 12/10/2016 1936   BILITOT 0.5 12/10/2016 1936     Lab Results  Component Value Date   WBC 10.9 (H) 12/14/2016   HGB 14.8 12/14/2016   HCT 45.2 12/14/2016   MCV 91.3 12/14/2016   PLT 311 12/14/2016   Lab  Results  Component Value Date   TSH 2.803 12/09/2016   Lab Results  Component Value Date   CHOL 376 (H) 12/10/2016   HDL 55 12/10/2016   LDLCALC 300 (H) 12/10/2016   LDLDIRECT 344.0 02/11/2014   TRIG 104 12/10/2016   CHOLHDL 6.8 12/10/2016   Lab Results  Component Value Date   HGBA1C 5.8 (H) 12/10/2016   MRI showed infarct CT showed chronic atrophy Both-small vessel dz  Carotid dopler 1-39% stenosis bilat  Echo-no clot/had grade 1 DD EEG showed background slowing (non specific)   bp is stable today  No cp or palpitations or headaches or edema  No side effects to medicines  BP Readings from Last 3 Encounters:  12/20/16 116/64  12/14/16 (!) 185/87  12/13/16 (!) 148/74     No more episodes/seizures - she is sedated   Has appt with neuro on 8/14   Needs to drink more fluid  Drinks cranberry juice   Lots of confusion In wheelchair  Needs more assistance to move her in or out of the car   Tramadol is on med list of nsg home  but she is not needing it   In the process of discussing hospice and order was written  Patient Active Problem List   Diagnosis Date Noted  . UTI (urinary tract infection) 12/20/2016  . Failure to thrive in adult 12/15/2016  . Complex partial seizure (HCC) 12/11/2016  . Dementia 12/11/2016  . Foot pain, bilateral 08/23/2016  . Hyponatremia 01/01/2016  . Routine general medical examination at a health care facility 11/10/2015  . Urinary frequency 10/23/2014  . Generalized weakness 10/23/2014  . Hammer toe 09/20/2014  . Herpetic dermatitis 08/09/2014  . Poor balance 04/11/2013  . Thoracic back pain 04/11/2013  . Urinary incontinence 12/27/2012  . Thoracic kyphosis 09/24/2012  . Fall 08/09/2012  . Hemorrhoid 12/24/2011  . Low back pain 12/24/2011  . Hypokalemia 10/06/2010  . HEMATURIA UNSPECIFIED 10/02/2009  . GERD 06/18/2009  . INCONTINENCE, URGE 06/18/2009  . FATIGUE 03/12/2009  . HYPOKALEMIA 10/08/2008  . MENOPAUSAL SYNDROME  01/22/2008  . EDEMA 07/19/2007  . Anxiety and depression 06/21/2007  . FOOT PAIN, CHRONIC 03/06/2007  . ALLERGIC RHINITIS, SEASONAL 09/12/2006  . IRRITABLE BOWEL SYNDROME 09/12/2006  . FIBROCYSTIC BREAST DISEASE 09/12/2006  . OSTEOARTHRITIS 09/12/2006  . HEMORRHOIDS, HX OF 09/12/2006  . Essential hypertension 05/31/2000  . Disorder of bone and cartilage 11/29/1999  . Hyperlipidemia 05/31/1992   Past Medical History:  Diagnosis Date  . Chronic foot pain    mortons neuroma  . Complication of anesthesia   . Cystocele 01/01   neg. sx  . fracture fibula ? 2003   left  . GERD (gastroesophageal reflux disease)   . Hemorrhoids   . Hyperlipemia 05/31/1992  . Hypertension 05/31/2000  . Menopausal symptoms    vasomotor symptoms-severe  . Osteoarthritis   . Osteopenia 11/29/1999  . Pedal edema    worsened by Norvasc  . Plantar fasciitis   . PONV (postoperative nausea and vomiting)    Past Surgical History:  Procedure Laterality Date  . Anterior repari w//surg proc  06/30/99   RSO  . BLADDER SUSPENSION    . BLADDER TACK    . CATARACTS    . Foot problems     neuromas  . history of CT     mass right adnexa, U?S by GYN-observation  . KYPHOPLASTY N/A 09/04/2012   Procedure: T7, T8,T12 Kyphoplasty ;  Surgeon: Clydene Fake, MD;  Location: [facility];  Service: Neurosurgery;  Laterality: N/A;  thoracic seven,eight, and twelve   . PARTIAL HYSTERECTOMY  1970   endometriosis   Social History  Substance Use Topics  . Smoking status: Never Smoker  . Smokeless tobacco: Never Used  . Alcohol use No   Family History  Problem Relation Age of Onset  . Diabetes Brother   . Hypertension Other   . Diabetes Mother   . Heart disease Mother        pacemaker  . Hypertension Mother   . COPD Father   . Diabetes Sister   . Mental illness Son        anxiety-mental health problems  . Diabetes Brother   . Diabetes Sister   . Diabetes Sister    Allergies  Allergen Reactions  .  Metronidazole Swelling and Other (See Comments)    Body aches, also  . Penicillins Itching    Allergy from childhood Has patient had a PCN reaction causing immediate rash, facial/tongue/throat swelling, SOB or lightheadedness with hypotension: Yes Has patient had a PCN reaction causing severe rash involving mucus membranes or skin necrosis:  Unknown Has patient had a PCN reaction that required hospitalization: Unknown Has patient had a PCN reaction occurring within the last 10 years: No If all of the above answers are "NO", then may proceed with Cephalosporin use.   . Tdap [Diphth-Acell Pertussis-Tetanus] Swelling    Unknown site of swelling  . Amlodipine Besylate Other (See Comments)    Edema  . Codeine Other (See Comments)    Headache   . Hydrocodone Other (See Comments)    Hallucinations   . Lovastatin Other (See Comments)    Leg pain  . Oxycodone Other (See Comments)    Made the patient "loopy"  . Sertraline Hcl Other (See Comments)    Insomnia   . Simvastatin Other (See Comments)    Cannot tolerate/ leg pain  . Tape Other (See Comments)    PLEASE USE COBAN WRAP; PATIENT'S SKIN IS VERY THIN AND IT TEARS EASILY!!  . Latex Rash  . Sulfonamide Derivatives Rash   Current Outpatient Prescriptions on File Prior to Visit  Medication Sig Dispense Refill  . acetaminophen (TYLENOL) 325 MG tablet Take 650 mg by mouth every 4 (four) hours as needed (for pain).     Marland Kitchen. aspirin 81 MG chewable tablet Chew 1 tablet (81 mg total) by mouth daily. 30 tablet 0  . busPIRone (BUSPAR) 15 MG tablet Take 1 tablet (15 mg total) by mouth 2 (two) times daily. 20 tablet 0  . Cholecalciferol (VITAMIN D3) 2000 UNITS TABS Take 1 tablet by mouth daily with breakfast.     . fluticasone (FLONASE) 50 MCG/ACT nasal spray PLACE 1 TO 2 SPRAYS IN EACH NOSTRIL DAILY. (Patient taking differently: Instill 1-2 sprays into each nostril once a day) 16 g 11  . metoprolol tartrate (LOPRESSOR) 25 MG tablet Take 0.5 tablets  (12.5 mg total) by mouth 2 (two) times daily. (Patient taking differently: Take 25 mg by mouth 2 (two) times daily. ) 60 tablet 0  . omeprazole (PRILOSEC) 20 MG capsule TAKE 1 CAPSULE BY MOUTH 2 TIMES DAILY. 180 capsule 0   No current facility-administered medications on file prior to visit.      Review of Systems Review of Systems  Constitutional: Negative for fever, appetite change,  and unexpected weight change. pos for sleepiness from seizure medication  Eyes: Negative for pain and visual disturbance.  Respiratory: Negative for cough and shortness of breath.   Cardiovascular: Negative for cp or palpitations    Gastrointestinal: Negative for nausea, diarrhea and constipation.  Genitourinary: pos for baseline urgency and incontinence and neg for dysuria and hematuria  Skin: Negative for pallor or rash   MSK pos for chronic back pain that is not bad lately Neurological: Negative for weakness, light-headedness, numbness and headaches.  Hematological: Negative for adenopathy. Does not bruise/bleed easily.  Psychiatric/Behavioral: pos for anxiety that is slt improved, pos for progressive dementia with more memory loss and confusion lately         Objective:   Physical Exam  Constitutional: She appears well-developed and well-nourished. No distress.  Frail appearing elderly female with dementia sitting in wheelchair   HENT:  Head: Normocephalic and atraumatic.  Mouth/Throat: Oropharynx is clear and moist.  Eyes: Pupils are equal, round, and reactive to light. Conjunctivae and EOM are normal. No scleral icterus.  Neck: Normal range of motion. Neck supple. No JVD present. Carotid bruit is not present. No thyromegaly present.  Cardiovascular: Normal rate, regular rhythm, normal heart sounds and intact distal pulses.  Exam reveals no gallop.  Pulmonary/Chest: Effort normal and breath sounds normal. No respiratory distress. She has no wheezes. She has no rales.  No crackles  Good air exch   Abdominal: Soft. Bowel sounds are normal. She exhibits no distension, no abdominal bruit and no mass. There is no tenderness.  No suprapubic tenderness or fullness   No cva tenderness   Musculoskeletal: She exhibits no edema or tenderness.  Lymphadenopathy:    She has no cervical adenopathy.  Neurological: She is alert. She has normal reflexes. She displays no atrophy and normal reflexes. No cranial nerve deficit or sensory deficit. She exhibits normal muscle tone.  Unable to ambulate without help from one or more people No focal weakness (just generalized)  Skin: Skin is warm and dry. No rash noted. No pallor.  Psychiatric: Her mood appears not anxious. Her affect is blunt. Her speech is delayed. She is slowed. She is not agitated and not aggressive. Thought content is not paranoid. Cognition and memory are impaired. She does not exhibit a depressed mood. She expresses no homicidal and no suicidal ideation. She exhibits abnormal recent memory.  Pt is sleepy today but does answer questions -however respose is delayed  Is alert and oriented today  Not anxious           Assessment & Plan:   Problem List Items Addressed This Visit      Cardiovascular and Mediastinum   Essential hypertension - Primary    Reviewed hospital records, lab results and studies in detail  bp in fair control at this time  BP Readings from Last 1 Encounters:  12/20/16 116/64   No changes needed Disc lifstyle change with low sodium diet and exercise   Stable on beta blocker  Will stay on this for cva prevention and watch pulse         Respiratory   ALLERGIC RHINITIS, SEASONAL    Will cut out astelin ns and keep flonase  If symptoms worsen-can add it back  Pt in agreement  Trying to minimize meds when able        Digestive   GERD    Continue ppi/omeprazole  Works well for her  Her appetite has picked up         Nervous and Auditory   Complex partial seizure (HCC)    Pt has had hosp and ED  visit  Originally placed on Vimpat - and now changing to phenobarb as the only drug covered at 60 mg bid  Disc sedation and other side eff Reviewed hospital records, lab results and studies in detail  -incl brain imaging and EEG For neuro appt mid next mo       Dementia    This is progressive/especially after cva With age and current condition pt and family are interested in comfort care primarily  In hospice now Will hold of on medications for dementia  Continues buspar for anxiety      Relevant Medications   PHENObarbital (LUMINAL) 60 MG tablet     Genitourinary   UTI (urinary tract infection)    Treated in ED with keflex Reviewed hospital records, lab results and studies in detail   E coli - sensitive  Feeling better Continues baseline urinary incontinence         Other   Anxiety and depression    Anxiety is mildly improved with buspar (per family complains less in general)       Failure to thrive in adult    Appetite is improving and wt  is up with inc po intake now       Generalized weakness   Hyperlipidemia    D/c cholesterol medications Hospice/comfort care now       Poor balance    To begin working with PT at [facility]      Thoracic back pain    Ongoing with hx of TS fractures  Not as bad as prev  Has tramadol ordered at [facility] but has not used it        Relevant Medications   traMADol (ULTRAM) 50 MG tablet

## 2016-12-20 NOTE — Assessment & Plan Note (Signed)
Anxiety is mildly improved with buspar (per family complains less in general)

## 2016-12-20 NOTE — Assessment & Plan Note (Signed)
Reviewed hospital records, lab results and studies in detail  bp in fair control at this time  BP Readings from Last 1 Encounters:  12/20/16 116/64   No changes needed Disc lifstyle change with low sodium diet and exercise   Stable on beta blocker  Will stay on this for cva prevention and watch pulse

## 2016-12-20 NOTE — Assessment & Plan Note (Signed)
Pt has had hosp and ED visit  Originally placed on Vimpat - and now changing to phenobarb as the only drug covered at 60 mg bid  Disc sedation and other side eff Reviewed hospital records, lab results and studies in detail  -incl brain imaging and EEG For neuro appt mid next mo

## 2016-12-20 NOTE — Telephone Encounter (Signed)
Please ok those verbal orders  

## 2016-12-20 NOTE — Telephone Encounter (Signed)
Spoke with pt daughter this morning she advised pt was in hospice as of Friday 07/20. I have removed the order at this time.

## 2016-12-20 NOTE — Assessment & Plan Note (Signed)
Reviewed hospital records, lab results and studies in detail  Generally weak but no residual focal deficits Now with partial complex seizures For neuro f/u  On asa and bp control  Hospice pt-declines cholesterol medication

## 2016-12-20 NOTE — Telephone Encounter (Signed)
[facility] [employer] [facility] [facility], who is requesting an order to see pt 3x/wk for 2wks and then 2x/wk for 2wks for PT and balance. pls advise

## 2016-12-20 NOTE — Assessment & Plan Note (Signed)
Continue ppi/omeprazole  Works well for her  Her appetite has picked up

## 2016-12-20 NOTE — Assessment & Plan Note (Signed)
D/c cholesterol medications Hospice/comfort care now

## 2016-12-20 NOTE — Assessment & Plan Note (Signed)
[facility]

## 2016-12-23 ENCOUNTER — Ambulatory Visit: Payer: PPO | Admitting: Licensed Clinical Social Worker

## 2017-01-01 ENCOUNTER — Emergency Department (HOSPITAL_COMMUNITY)
Admission: EM | Admit: 2017-01-01 | Discharge: 2017-01-01 | Disposition: A | Discharge status: Skilled Nursing Facility | Attending: Emergency Medicine | Admitting: Emergency Medicine

## 2017-01-01 DIAGNOSIS — Y939 Activity, unspecified: Secondary | ICD-10-CM | POA: Diagnosis not present

## 2017-01-01 DIAGNOSIS — Y9210 Unspecified residential institution as the place of occurrence of the external cause: Secondary | ICD-10-CM | POA: Insufficient documentation

## 2017-01-01 DIAGNOSIS — S40021A Contusion of right upper arm, initial encounter: Secondary | ICD-10-CM | POA: Insufficient documentation

## 2017-01-01 DIAGNOSIS — W19XXXA Unspecified fall, initial encounter: Secondary | ICD-10-CM | POA: Diagnosis not present

## 2017-01-01 DIAGNOSIS — Z91040 Latex allergy status: Secondary | ICD-10-CM | POA: Insufficient documentation

## 2017-01-01 DIAGNOSIS — R260 Ataxic gait: Secondary | ICD-10-CM | POA: Diagnosis not present

## 2017-01-01 DIAGNOSIS — R4182 Altered mental status, unspecified: Secondary | ICD-10-CM | POA: Diagnosis not present

## 2017-01-01 DIAGNOSIS — Z7982 Long term (current) use of aspirin: Secondary | ICD-10-CM | POA: Diagnosis not present

## 2017-01-01 DIAGNOSIS — Z79899 Other long term (current) drug therapy: Secondary | ICD-10-CM | POA: Insufficient documentation

## 2017-01-01 DIAGNOSIS — R404 Transient alteration of awareness: Secondary | ICD-10-CM | POA: Diagnosis not present

## 2017-01-01 DIAGNOSIS — Y999 Unspecified external cause status: Secondary | ICD-10-CM | POA: Insufficient documentation

## 2017-01-01 DIAGNOSIS — S4991XA Unspecified injury of right shoulder and upper arm, initial encounter: Secondary | ICD-10-CM | POA: Diagnosis present

## 2017-01-01 DIAGNOSIS — I10 Essential (primary) hypertension: Secondary | ICD-10-CM | POA: Diagnosis not present

## 2017-01-01 DIAGNOSIS — R531 Weakness: Secondary | ICD-10-CM | POA: Diagnosis not present

## 2017-01-01 LAB — CBC WITH DIFFERENTIAL/PLATELET
Basophils Absolute: 0 10*3/uL (ref 0.0–0.1)
Basophils Relative: 0 %
EOS ABS: 0.2 10*3/uL (ref 0.0–0.7)
EOS PCT: 1 %
HCT: 46.6 % — ABNORMAL HIGH (ref 36.0–46.0)
Hemoglobin: 16 g/dL — ABNORMAL HIGH (ref 12.0–15.0)
LYMPHS ABS: 1.7 10*3/uL (ref 0.7–4.0)
LYMPHS PCT: 12 %
MCH: 30.9 pg (ref 26.0–34.0)
MCHC: 34.3 g/dL (ref 30.0–36.0)
MCV: 90 fL (ref 78.0–100.0)
MONO ABS: 0.8 10*3/uL (ref 0.1–1.0)
MONOS PCT: 6 %
Neutro Abs: 11.3 10*3/uL — ABNORMAL HIGH (ref 1.7–7.7)
Neutrophils Relative %: 81 %
PLATELETS: 258 10*3/uL (ref 150–400)
RBC: 5.18 MIL/uL — ABNORMAL HIGH (ref 3.87–5.11)
RDW: 14.4 % (ref 11.5–15.5)
WBC: 13.9 10*3/uL — ABNORMAL HIGH (ref 4.0–10.5)

## 2017-01-01 LAB — COMPREHENSIVE METABOLIC PANEL
ALT: 30 U/L (ref 14–54)
ANION GAP: 12 (ref 5–15)
AST: 42 U/L — ABNORMAL HIGH (ref 15–41)
Albumin: 4.4 g/dL (ref 3.5–5.0)
Alkaline Phosphatase: 94 U/L (ref 38–126)
BUN: 15 mg/dL (ref 6–20)
CALCIUM: 9.6 mg/dL (ref 8.9–10.3)
CHLORIDE: 95 mmol/L — AB (ref 101–111)
CO2: 29 mmol/L (ref 22–32)
CREATININE: 0.91 mg/dL (ref 0.44–1.00)
GFR, EST NON AFRICAN AMERICAN: 57 mL/min — AB (ref >60)
Glucose, Bld: 106 mg/dL — ABNORMAL HIGH (ref 65–99)
Potassium: 4.1 mmol/L (ref 3.5–5.1)
SODIUM: 136 mmol/L (ref 135–145)
Total Bilirubin: 0.2 mg/dL — ABNORMAL LOW (ref 0.3–1.2)
Total Protein: 8.1 g/dL (ref 6.5–8.1)

## 2017-01-01 LAB — I-STAT CG4 LACTIC ACID, ED: LACTIC ACID, VENOUS: 2.47 mmol/L — AB (ref 0.5–1.9)

## 2017-01-01 LAB — CK: CK TOTAL: 820 U/L — AB (ref 38–234)

## 2017-01-01 MED ORDER — LACTATED RINGERS IV BOLUS (SEPSIS)
1000.0000 mL | Freq: Once | INTRAVENOUS | Status: AC
  Administered 2017-01-01: 1000 mL via INTRAVENOUS

## 2017-01-01 NOTE — ED Notes (Signed)
[facility] called at  this time. 

## 2017-01-01 NOTE — ED Provider Notes (Signed)
[facility]-EMERGENCY DEPT Provider Note   CSN: [id number] Arrival date & time: 01/01/17  0807     History   Chief Complaint Chief Complaint  Patient presents with  . Fall    HPI Amber Rollins is a 83 y.o. female.  HPI Pt comes in with cc of fall. LEVEL 5 CAVEAT FOR ALTERED MENTAL STATUS DUE TO DEMENTIA.  Per report pt had unwitnessed fall. Pt is wheel chair bound and was found by a wheel chair. There  Is hx of seizures. No seizure was witnessed. Records show recent UTI. Pt was down on the floor for unknown time. Pt has no complains. She does indicate some right arm pain, but reports it is not severe. Pt is not on any pain meds.  [facility] and she is comfort care only. Pt's hospice is [facility] - I called them and left a message. I spoke with the daughter, who informed me that if there was a bleed, her mother would not want surgery.  Past Medical History:  Diagnosis Date  . Chronic foot pain    mortons neuroma  . Complication of anesthesia   . Cystocele 01/01   neg. sx  . fracture fibula ? 2003   left  . GERD (gastroesophageal reflux disease)   . Hemorrhoids   . Hyperlipemia 05/31/1992  . Hypertension 05/31/2000  . Menopausal symptoms    vasomotor symptoms-severe  . Osteoarthritis   . Osteopenia 11/29/1999  . Pedal edema    worsened by Norvasc  . Plantar fasciitis   . PONV (postoperative nausea and vomiting)     Patient Active Problem List   Diagnosis Date Noted  . UTI (urinary tract infection) 12/20/2016  . History of CVA (cerebrovascular accident) 12/20/2016  . Failure to thrive in adult 12/15/2016  . Complex partial seizure (HCC) 12/11/2016  . Dementia 12/11/2016  . Foot pain, bilateral 08/23/2016  . Hyponatremia 01/01/2016  . Routine general medical examination at a health care facility 11/10/2015  . Urinary frequency 10/23/2014  . Generalized weakness 10/23/2014  . Hammer toe 09/20/2014  . Herpetic dermatitis  08/09/2014  . Poor balance 04/11/2013  . Thoracic back pain 04/11/2013  . Urinary incontinence 12/27/2012  . Thoracic kyphosis 09/24/2012  . Fall 08/09/2012  . Hemorrhoid 12/24/2011  . Low back pain 12/24/2011  . Hypokalemia 10/06/2010  . HEMATURIA UNSPECIFIED 10/02/2009  . GERD 06/18/2009  . INCONTINENCE, URGE 06/18/2009  . FATIGUE 03/12/2009  . HYPOKALEMIA 10/08/2008  . MENOPAUSAL SYNDROME 01/22/2008  . EDEMA 07/19/2007  . Anxiety and depression 06/21/2007  . FOOT PAIN, CHRONIC 03/06/2007  . ALLERGIC RHINITIS, SEASONAL 09/12/2006  . IRRITABLE BOWEL SYNDROME 09/12/2006  . FIBROCYSTIC BREAST DISEASE 09/12/2006  . OSTEOARTHRITIS 09/12/2006  . HEMORRHOIDS, HX OF 09/12/2006  . Essential hypertension 05/31/2000  . Disorder of bone and cartilage 11/29/1999  . Hyperlipidemia 05/31/1992    Past Surgical History:  Procedure Laterality Date  . Anterior repari w//surg proc  06/30/99   RSO  . BLADDER SUSPENSION    . BLADDER TACK    . CATARACTS    . Foot problems     neuromas  . history of CT     mass right adnexa, U?S by GYN-observation  . KYPHOPLASTY N/A 09/04/2012   Procedure: T7, T8,T12 Kyphoplasty ;  Surgeon: Clydene FakeJames R Hirsch, MD;  Location: [facility];  Service: Neurosurgery;  Laterality: N/A;  thoracic seven,eight, and twelve   . PARTIAL HYSTERECTOMY  1970   endometriosis  OB History    No data available       Home Medications    Prior to Admission medications   Medication Sig Start Date End Date Taking? Authorizing Provider  acetaminophen (TYLENOL) 325 MG tablet Take 650 mg by mouth every 4 (four) hours as needed (for pain).    Yes [provider]  aspirin 81 MG chewable tablet Chew 1 tablet (81 mg total) by mouth daily. 12/11/16  Yes Albertine GratesXu, Fang, MD  azelastine (ASTELIN) 0.1 % nasal spray Place 2 sprays into both nostrils 2 (two) times daily. Use in each nostril as directed   Yes [provider]  busPIRone (BUSPAR) 15 MG tablet Take 1 tablet (15 mg  total) by mouth 2 (two) times daily. 12/13/16  Yes Rodolph Bonghompson, Daniel V, MD  Cholecalciferol (VITAMIN D3) 2000 UNITS TABS Take 1 tablet by mouth daily with breakfast.    Yes [provider]  fluticasone (FLONASE) 50 MCG/ACT nasal spray PLACE 1 TO 2 SPRAYS IN EACH NOSTRIL DAILY. 02/27/16  Yes Tower, Audrie GallusMarne A, MD  metoprolol tartrate (LOPRESSOR) 25 MG tablet Take 0.5 tablets (12.5 mg total) by mouth 2 (two) times daily. 12/10/16 12/10/17 Yes Albertine GratesXu, Fang, MD  mometasone (ELOCON) 0.1 % cream Apply 1 application topically daily as needed.   Yes [provider]  omega-3 acid ethyl esters (LOVAZA) 1 g capsule Take 1 g by mouth daily.   Yes [provider]  omeprazole (PRILOSEC) 20 MG capsule TAKE 1 CAPSULE BY MOUTH 2 TIMES DAILY. 06/21/16  Yes Tower, Audrie GallusMarne A, MD  PHENObarbital (LUMINAL) 60 MG tablet Take 1 tablet (60 mg total) by mouth 2 (two) times daily. 12/20/16  Yes Tower, Audrie GallusMarne A, MD  traMADol (ULTRAM) 50 MG tablet Take 50 mg by mouth every 6 (six) hours as needed.   Yes [provider]    Family History Family History  Problem Relation Age of Onset  . Diabetes Brother   . Hypertension Other   . Diabetes Mother   . Heart disease Mother        pacemaker  . Hypertension Mother   . COPD Father   . Diabetes Sister   . Mental illness Son        anxiety-mental health problems  . Diabetes Brother   . Diabetes Sister   . Diabetes Sister     Social History Social History  Substance Use Topics  . Smoking status: Never Smoker  . Smokeless tobacco: Never Used  . Alcohol use No     Allergies   Metronidazole; Penicillins; Tdap [diphth-acell pertussis-tetanus]; Amlodipine besylate; Codeine; Hydrocodone; Lovastatin; Oxycodone; Sertraline hcl; Simvastatin; Tape; Latex; and Sulfonamide derivatives   Review of Systems Review of Systems  Unable to perform ROS: Dementia     Physical Exam Updated Vital Signs BP (!) 162/112 (BP Location: Right Arm)   Pulse 73   Temp  (!) 96.7 F (35.9 C) (Rectal)   Resp 18   SpO2 100%   Physical Exam  Constitutional: She appears well-developed.  HENT:  Head: Normocephalic and atraumatic.  Eyes: EOM are normal.  Neck: Normal range of motion. Neck supple.  Cardiovascular: Normal rate.   Pulmonary/Chest: Effort normal.  Abdominal: Bowel sounds are normal.  Musculoskeletal: She exhibits edema and tenderness.  Neurological: She is alert.  Skin: Skin is warm and dry. Rash noted. There is erythema.  Nursing note and vitals reviewed.    ED Treatments / Results  Labs (all labs ordered are listed, but only abnormal results are  displayed) Labs Reviewed  CBC WITH DIFFERENTIAL/PLATELET - Abnormal; Notable for the following:       Result Value   WBC 13.9 (*)    RBC 5.18 (*)    Hemoglobin 16.0 (*)    HCT 46.6 (*)    Neutro Abs 11.3 (*)    All other components within normal limits  COMPREHENSIVE METABOLIC PANEL - Abnormal; Notable for the following:    Chloride 95 (*)    Glucose, Bld 106 (*)    AST 42 (*)    Total Bilirubin 0.2 (*)    GFR calc non Af Amer 57 (*)    All other components within normal limits  CK - Abnormal; Notable for the following:    Total CK 820 (*)    All other components within normal limits  I-STAT CG4 LACTIC ACID, ED - Abnormal; Notable for the following:    Lactic Acid, Venous 2.47 (*)    All other components within normal limits    EKG  EKG Interpretation  Date/Time:  Saturday January 01 2017 08:28:09 EDT Ventricular Rate:  71 PR Interval:    QRS Duration: 83 QT Interval:  424 QTC Calculation: 461 R Axis:   5 Text Interpretation:  Sinus rhythm Normal ECG Confirmed by Geoffery Lyons ([id number]) on 01/01/2017 9:09:59 AM       Radiology No results found.  Procedures Procedures (including critical care time)  Medications Ordered in ED Medications  lactated ringers bolus 1,000 mL (1,000 mLs Intravenous New Bag/Given 01/01/17 1114)     Initial Impression / Assessment and Plan /  ED Course  I have reviewed the triage vital signs and the nursing notes.  Pertinent labs & imaging results that were available during my care of the patient were reviewed by me and considered in my medical decision making (see chart for details).    DDx includes: - Mechanical falls - ICH - Fractures - Contusions - Soft tissue injury  Pt comes in after a fall. She is comfort care, Hospice and it seems like even if there was a brain bleed, they would not want any therapy. Pt has mild contusion to the R arm. There is no significant tenderness to palpation and ot able to pronate and supinate, and move the wrist, so I doubt fracture. Unknown down time, so CK ordered, and it is mildly elevated, thus we hydrated her in the ER. Pt has hx of seizure - so we will watch her in the ER for 3+ hours to ensure that wasn't the cause for her fall in first place.  Final Clinical Impressions(s) / ED Diagnoses   Final diagnoses:  Fall, initial encounter  Contusion of right upper extremity, initial encounter    New Prescriptions New Prescriptions   No medications on file     Derwood Kaplan, MD 01/01/17 1121

## 2017-01-01 NOTE — Discharge Instructions (Signed)
°  Please be very careful with walking, and do everything possible to prevent falls.  We hydrated Amber Rollins with 1 liter iv fluid, it is prudent that she gets hydrated well and someone is looking out for that. She has some arm swelling, we dont think there is a fracture. Also - no seizure seen here, but please be on a lookout for seizure.

## 2017-01-01 NOTE — ED Notes (Signed)
Bed: [id number] Expected date: 01/01/17 Expected time: 8:07 AM Means of arrival: Ambulance Comments: Fall from Nsg home 1-2 hours on floor

## 2017-01-01 NOTE — ED Triage Notes (Signed)
Pt was found in floor of bedroom down for unknown amount of time. Pt is cool to touch and lethargic but able to arouse. Pt presents with mild redness to right wrist, elbow, hip, and ankle.

## 2017-01-01 NOTE — Progress Notes (Signed)
[facility]:  RN visit  Received a call from on-call, to advise that patient was in the ED.  AVS already in place.  Spoke to the bedside RN, who advised patient is well and will be sent back to facility.  Patient was alert, sitting up in bed and eating a [place]key sandwich.  She explained, "I took a spill today, but I'm not hurt".  Patient denied any pain.  Patient in NAD.   Thank you,  Adele BarthelAmy Evans, RN, BSN [facility]iaison [phone]  All hospital liaisons are now on AMION.

## 2017-01-07 ENCOUNTER — Ambulatory Visit: Payer: PPO | Admitting: Podiatry

## 2017-01-11 ENCOUNTER — Encounter: Payer: Self-pay | Admitting: Neurology

## 2017-01-11 ENCOUNTER — Ambulatory Visit (INDEPENDENT_AMBULATORY_CARE_PROVIDER_SITE_OTHER): Admitting: Neurology

## 2017-01-11 VITALS — BP 118/62 | HR 62 | Resp 16 | Ht 63.5 in | Wt 130.0 lb

## 2017-01-11 DIAGNOSIS — G40209 Localization-related (focal) (partial) symptomatic epilepsy and epileptic syndromes with complex partial seizures, not intractable, without status epilepticus: Secondary | ICD-10-CM

## 2017-01-11 DIAGNOSIS — F0391 Unspecified dementia with behavioral disturbance: Secondary | ICD-10-CM

## 2017-01-11 DIAGNOSIS — F03B18 Unspecified dementia, moderate, with other behavioral disturbance: Secondary | ICD-10-CM

## 2017-01-11 MED ORDER — DIVALPROEX SODIUM ER 250 MG PO TB24: ORAL_TABLET | ORAL | 6 refills | Status: AC

## 2017-01-11 NOTE — Patient Instructions (Addendum)
1. Start Depakote ER 250mg : Take 1 tablet at night for 1 week, then increase to 2 tablets at night 2. Reduce Phenobarbital 60mg : Take 1/2 tablet in AM, 1 tablet in PM for 1 week, then reduce to 1/2 tablet twice a day for a week, then reduce to 1/2 tablet every night for a week, then stop 3. Follow-up in 2 months, call for any changes  Seizure Precautions:. 1. If medication has been prescribed for you to prevent seizures, take it exactly as directed.  Do not stop taking the medicine without talking to your doctor first, even if you have not had a seizure in a long time.   2. Avoid activities in which a seizure would cause danger to yourself or to others.  Don't operate dangerous machinery, swim alone, or climb in high or dangerous places, such as on ladders, roofs, or girders.  Do not drive unless your doctor says you may.  3. If you have any warning that you may have a seizure, lay down in a safe place where you can't hurt yourself.    4.  No driving for 6 months from last seizure, [place].   Please refer to the following link on the Epile[employer] of America's website for more information: http://www.epilepsyfoundation.org/answerplace/Social/driving/drivingu.cfm   5.  Maintain good sleep hygiene. Avoid alcohol.  6.  Contact your doctor if you have any problems that may be related to the medicine you are taking.  7.  Call 911 and bring the patient back to the ED if:        A.  The seizure lasts longer than 5 minutes.       B.  The patient doesn't awaken shortly after the seizure  C.  The patient has new problems such as difficulty seeing, speaking or moving  D.  The patient was injured during the seizure  E.  The patient has a temperature over 102 F (39C)  F.  The patient vomited and now is having trouble breathing

## 2017-01-11 NOTE — Progress Notes (Signed)
NEUROLOGY CONSULTATION NOTE  Amber SinkShirley Anne Rollins MRN: [id number] DOB: 28-Dec-1930  Referring provider: Dr. Roxy MannsMarne Tower Primary care provider: Dr. Roxy MannsMarne Tower  Reason for consult:  seizures  Dear Dr Milinda Antisower:  Thank you for your kind referral of Amber Rollins for consultation of the above symptoms. Although her history is well known to you, please allow me to reiterate it for the purpose of our medical record. The patient was accompanied to the clinic by her son and daughter who also provide collateral information. Records and images were personally reviewed where available.  HISTORY OF PRESENT ILLNESS: This is a pleasant 83 year old right-handed woman with a history of hypertension,hyperlipidemia, in her usual state of health until 12/08/2016 when she had an episode of unresponsiveness while sitting with the family. Her daughter reports they were having a conversation when she started slowly blinking her eyes then her head slumped forward for a few minutes. When she woke up, she was disoriented and did not recall any details of the episode. [facility] where BP was elevated, bloodwork was unremarkable, urinalysis negative. She had an MRI brain without contrast which I personally reviewed, there was motion degradation. Diffusion imaging showed a punctate focus of acute infarct in the left posterior temporal/occipital junction. There was moderate chronic microvascular disease with old infarcts in the bilateral basal ganglia. Neurology felt the changes potentially were artifactual. She had an EEG which showed mild to moderate diffuse slowing, with rare focal slowing over the left temporal region. Echo showed EF of 65%, grade 1 diastolic dysfunction, no thrombus. Carotid dopplers unremarkable. [facility], then back that evening after she had an episode while sitting on the couch and passing out. When EMS arrived, she was alert but disoriented. [facility] was [facility]  [facility] where head CT did not show any acute changes. It was noted mental status progressively improved. Repeat EEG showed mild background slowing. Seizure was suspected and she was started on Vimpat 50mg  BID. [facility], then returned the next morning after she was noted to be sitting for lunch when she had blinking of her eyes, then loss of consciousness. She became hypoxic. Episode lasted 10 minutes then she began to regain consciousness, confused prior to arrival to the ER. She was discharged back to SNF on Vimpat 100mg  BID. [facility], request for referral to hospice was made due to change in condition. She was unable to do ADLs and highly confused. Her daughter reports that prior to all this, [facility]ator. Since then, she has been mostly wheelchair-bound, receiving 15 minutes of PT twice a week. Her son reports it appears that her brain does not connect with her body, when she is asked to reach for something or move her legs, she cannot do it. Vimpat was cost-prohibitive, and family reports that phenobarbital is the medication that "hospice would pay for." She is currently on 60mg  BID, her son feels she is sleepy all the time, mobility is worse. They have noticed a big change in personality, [facility], she was agitated and complaining about everything, now she is "kinder" and complaining less.   There is no prior history of seizures. She has had cognitive decline since 2014, at that time she fell down from the ceiling and family feels memory has been progressive since then. She has a head tremor  today which family reports has been ongoing for several years. She reports sleep is okay. She feels her memory is okay. She states she had to go to the hospital because of incontinence. She denies any headaches, dizziness, diplopia, dysarthria/dysphagia, neck/back pain, focal  numbness/tingling/weakness. She has urinary incontinence. No recent falls. No family history of dementia or seizures. No history of significant head injuries or alcohol use. Family does not know of any history of febrile convulsions, CNS infections such as meningitis/encephalitis, significant traumatic brain injury, neurosurgical procedures.  PAST MEDICAL HISTORY: Past Medical History:  Diagnosis Date  . Chronic foot pain    mortons neuroma  . Complication of anesthesia   . Cystocele 01/01   neg. sx  . fracture fibula ? 2003   left  . GERD (gastroesophageal reflux disease)   . Hemorrhoids   . Hyperlipemia 05/31/1992  . Hypertension 05/31/2000  . Menopausal symptoms    vasomotor symptoms-severe  . Osteoarthritis   . Osteopenia 11/29/1999  . Pedal edema    worsened by Norvasc  . Plantar fasciitis   . PONV (postoperative nausea and vomiting)   . Seizures (HCC)     PAST SURGICAL HISTORY: Past Surgical History:  Procedure Laterality Date  . Anterior repari w//surg proc  06/30/99   RSO  . BACK SURGERY    . BLADDER SUSPENSION    . BLADDER TACK    . CATARACTS    . Foot problems     neuromas  . history of CT     mass right adnexa, U?S by GYN-observation  . KYPHOPLASTY N/A 09/04/2012   Procedure: T7, T8,T12 Kyphoplasty ;  Surgeon: Clydene Fake, MD;  Location: [facility];  Service: Neurosurgery;  Laterality: N/A;  thoracic seven,eight, and twelve   . PARTIAL HYSTERECTOMY  1970   endometriosis    MEDICATIONS: Current Outpatient Prescriptions on File Prior to Visit  Medication Sig Dispense Refill  . acetaminophen (TYLENOL) 325 MG tablet Take 650 mg by mouth every 4 (four) hours as needed (for pain).     Marland Kitchen aspirin 81 MG chewable tablet Chew 1 tablet (81 mg total) by mouth daily. 30 tablet 0  . azelastine (ASTELIN) 0.1 % nasal spray Place 2 sprays into both nostrils 2 (two) times daily. Use in each nostril as directed    . busPIRone (BUSPAR) 15 MG tablet Take 1 tablet (15 mg  total) by mouth 2 (two) times daily. 20 tablet 0  . Cholecalciferol (VITAMIN D3) 2000 UNITS TABS Take 1 tablet by mouth daily with breakfast.     . fluticasone (FLONASE) 50 MCG/ACT nasal spray PLACE 1 TO 2 SPRAYS IN EACH NOSTRIL DAILY. 16 g 11  . metoprolol tartrate (LOPRESSOR) 25 MG tablet Take 0.5 tablets (12.5 mg total) by mouth 2 (two) times daily. 60 tablet 0  . mometasone (ELOCON) 0.1 % cream Apply 1 application topically daily as needed.    Marland Kitchen omega-3 acid ethyl esters (LOVAZA) 1 g capsule Take 1 g by mouth daily.    Marland Kitchen omeprazole (PRILOSEC) 20 MG capsule TAKE 1 CAPSULE BY MOUTH 2 TIMES DAILY. 180 capsule 0  . PHENObarbital (LUMINAL) 60 MG tablet Take 1 tablet (60 mg total) by mouth 2 (two) times daily. 60 tablet 5  . traMADol (ULTRAM) 50 MG tablet Take 50 mg by mouth every 6 (six) hours as needed.     No current facility-administered medications on file prior to visit.     ALLERGIES: Allergies  Allergen Reactions  . Metronidazole Swelling and  Other (See Comments)    Body aches, also  . Penicillins Itching    Allergy from childhood Has patient had a PCN reaction causing immediate rash, facial/tongue/throat swelling, SOB or lightheadedness with hypotension: Yes Has patient had a PCN reaction causing severe rash involving mucus membranes or skin necrosis: Unknown Has patient had a PCN reaction that required hospitalization: Unknown Has patient had a PCN reaction occurring within the last 10 years: No If all of the above answers are "NO", then may proceed with Cephalosporin use.   . Tdap [Diphth-Acell Pertussis-Tetanus] Swelling    Unknown site of swelling  . Amlodipine Besylate Other (See Comments)    Edema  . Codeine Other (See Comments)    Headache   . Hydrocodone Other (See Comments)    Hallucinations   . Lovastatin Other (See Comments)    Leg pain  . Oxycodone Other (See Comments)    Made the patient "loopy"  . Sertraline Hcl Other (See Comments)    Insomnia   .  Simvastatin Other (See Comments)    Cannot tolerate/ leg pain  . Tape Other (See Comments)    PLEASE USE COBAN WRAP; PATIENT'S SKIN IS VERY THIN AND IT TEARS EASILY!!  . Latex Rash  . Sulfonamide Derivatives Rash    FAMILY HISTORY: Family History  Problem Relation Age of Onset  . Diabetes Brother   . Hypertension Other   . Diabetes Mother   . Heart disease Mother        pacemaker  . Hypertension Mother   . COPD Father   . Diabetes Sister   . Cancer - Lung Sister   . Mental illness Son        anxiety-mental health problems  . Diabetes Brother   . Diabetes Sister   . Diabetes Sister     SOCIAL HISTORY: Social History   Social History  . Marital status: Widowed    Spouse name: N/A  . Number of children: N/A  . Years of education: N/A   Occupational History  . Not on file.   Social History Main Topics  . Smoking status: Never Smoker  . Smokeless tobacco: Never Used  . Alcohol use No  . Drug use: No  . Sexual activity: Not Currently   Other Topics Concern  . Not on file   Social History Narrative   Allergic to TD   Does some volunteer visits to nsg homes    REVIEW OF SYSTEMS: Constitutional: No fevers, chills, or sweats, no generalized fatigue, change in appetite Eyes: No visual changes, double vision, eye pain Ear, nose and throat: No hearing loss, ear pain, nasal congestion, sore throat Cardiovascular: No chest pain, palpitations Respiratory:  No shortness of breath at rest or with exertion, wheezes GastrointestinaI: No nausea, vomiting, diarrhea, abdominal pain, fecal incontinence Genitourinary:  No dysuria, urinary retention or frequency Musculoskeletal:  No neck pain, back pain Integumentary: No rash, pruritus, skin lesions Neurological: as above Psychiatric: No depression, insomnia, anxiety Endocrine: No palpitations, fatigue, diaphoresis, mood swings, change in appetite, change in weight, increased thirst Hematologic/Lymphatic:  No anemia, purpura,  petechiae. Allergic/Immunologic: no itchy/runny eyes, nasal congestion, recent allergic reactions, rashes  PHYSICAL EXAM: Vitals:   01/11/17 1256  BP: 118/62  Pulse: 62  Resp: 16   General: No acute distress, sitting in wheelchair Head:  Normocephalic/atraumatic Eyes: Fundoscopic exam shows bilateral sharp discs, no vessel changes, exudates, or hemorrhages Neck: supple, no paraspinal tenderness, full range of motion Back: No paraspinal tenderness Heart: regular rate  and rhythm Lungs: Clear to auscultation bilaterally. Vascular: No carotid bruits. Skin/Extremities: No rash, no edema Neurological Exam: Mental status: alert and oriented to person. When asked date, she states it is "3/4." She gives wrong birth date "[birth year]" instead of [birth year]. No dysarthria or aphasia, Fund of knowledge is reduced.  Recent and remote memory are impaired.  Attention and concentration are normal.    Able to name objects and repeat phrases. Unable to draw clock MMSE - Mini Mental State Exam 01/11/2017  Orientation to time 0  Orientation to Place 0  Registration 3  Attention/ Calculation 5  Recall 0  Language- name 2 objects 2  Language- repeat 1  Language- follow 3 step command 2  Language- read & follow direction 1  Write a sentence 0  Copy design 0  Total score 14   Cranial nerves: CN I: not tested CN II: pupils equal, round and reactive to light, visual fields intact, fundi unremarkable. CN III, IV, VI:  full range of motion, no nystagmus, no ptosis CN V: facial sensation intact CN VII: upper and lower face symmetric CN VIII: hearing intact to finger rub CN IX, X: gag intact, uvula midline CN XI: sternocleidomastoid and trapezius muscles intact CN XII: tongue midline Bulk & Tone: +cogwheeling bilaterally, no fasciculations. Motor: 5/5 throughout with no pronator drift. Sensation: intact to light touch, cold, pin, vibration and joint position sense.  No extinction to double simultaneous  stimulation.   Deep Tendon Reflexes: +2 throughout, no ankle clonus Plantar responses: downgoing bilaterally Cerebellar: no incoordination on finger to nose Gait: not tested, needs 2-person assist Tremor: nodding head tremor, no resting tremor in extremities, no postural/action tremor noted  IMPRESSION: This is an 83 year old right-handed woman with a history of hypertension,hyperlipidemia, in her usual state of health until 12/08/2016 when she had an episode of unresponsiveness while sitting with the family. She has had 2 more after that, with report of eye blinking followed by loss of consciousness. Initial MRI brain had shown a punctate region of diffusion abnormality reported as acute infarct, which would not cause the symptoms. She has had 2 EEGs showing slowing, one with rare left temporal focal slowing. Symptoms suggestive of focal seizures, Vimpat was cost-prohibitive, she has been switched to Phenobarbital which is causing sedation and overall change in personality. She appears to have some apraxia as well, individual muscle testing is normal, but patient has difficulty initiating movements per family. We discussed switching to a different seizure medication, Depakote, which may cause some amount of drowsiness as well, but could potentially help with dementia-related behavioral changes and would not be as sedating as Phenobarbital. Family agrees with plan, start Depakote ER 250mg  qhs x 1 week, then increase to 500mg  qhs. They will start tapering off Phenobarbital by 30mg  every week, instructions discussed with family. Her MMSE today is 14/30, prior discussions regarding goals of care and quality of life have been done with her family, they have agreed on holding off on any dementia medications. Continue aspirin and control of vascular risk factors for secondary stroke prevention. She will follow-up in 2 months and family knows to call for any changes.   Thank you for allowing me to participate in  the care of this patient. Please do not hesitate to call for any questions or concerns.   Patrcia Dolly, M.D.  CC: Dr. Milinda Antis, Dr. Janee Morn

## 2017-01-14 ENCOUNTER — Telehealth: Payer: Self-pay | Admitting: Neurology

## 2017-01-14 NOTE — Telephone Encounter (Signed)
Raynelle Fanning from [facility] called regarding this patient needing a new script sent in with written directions and number of tablets to dispense. The facility Fax # is [phone]. The patient uses [facility] care [facility] [phone]. Thanks

## 2017-01-14 NOTE — Telephone Encounter (Signed)
Information faxed to [facility] at number below

## 2017-01-17 ENCOUNTER — Telehealth: Payer: Self-pay

## 2017-01-17 ENCOUNTER — Telehealth: Payer: Self-pay | Admitting: Family Medicine

## 2017-01-17 ENCOUNTER — Telehealth: Payer: Self-pay | Admitting: Neurology

## 2017-01-17 MED ORDER — BUSPIRONE HCL 15 MG PO TABS: 15.0000 mg | ORAL_TABLET | Freq: Two times a day (BID) | ORAL | 11 refills | Status: AC

## 2017-01-17 NOTE — Telephone Encounter (Signed)
Thank you, we are sending a new Rx for 30mg  tabs. I wish they had informed us about this before just stopping the medication.

## 2017-01-17 NOTE — Telephone Encounter (Signed)
I will forward to Dr Karel Jarvis who takes care of her seizure medicines

## 2017-01-17 NOTE — Telephone Encounter (Signed)
PLEASE NOTE: All timestamps contained within this report are represented as [place] Standard Time. CONFIDENTIALTY NOTICE: This fax transmission is intended only for the addressee. It contains information that is legally privileged, confidential or otherwise protected from use or disclosure. If you are not the intended recipient, you are strictly prohibited from reviewing, disclosing, copying using or disseminating any of this information or taking any action in reliance on or regarding this information. If you have received this fax in error, please notify us immediately by telephone so that we can arrange for its return to [place]. Phone: [phone], Toll-Free: [phone], Fax: [phone] Page: 1 of 2 Call Id: [id number] [place] [facility] Night - Client TELEPHONE ADVICE RECORD [facility] Patient Name: Amber Rollins Gender: Female DOB: 1930-07-31 Age: 83 Y 9 M 27 D Return Phone Number: [phone] (Primary) City/State/Zip: [place] [address] Client [facility] Night - Client Client Site [facility] [place] - Night Physician Tower, Idamae Schuller - MD Who Is Calling Patient / Member / Family / Caregiver Call Type Triage / Clinical Caller Name Legrand Como Relationship To Patient Care Giver Return Phone Number [phone] (Primary) Chief Complaint Medication Question (non symptomatic) Reason for Call Medication Question / Request Initial Comment Hospice Nurse with medication questions for a pt No Triage Reason Other Nurse Assessment Nurse: Charlcie Cradle, RN, Alcario Drought Date/Time (Eastern Time): 01/15/2017 12:52:42 PM Confirm and document reason for call. If symptomatic, describe symptoms. ---Caller states she got a call from staff at [facility] that pt was unresponsive, upon arrival pt was post dictal, pt supposed to be on Phenobarbital, rx was delivered in 60mg  tab but med techs cannot score pills in half to give her the  30mg  so they have not been giving anything, tech says that the pharmacy is needing a new hard rx because the rx they received did not have a quantity. She is supposed to be titrating down off of the Phenobarbital. Need specifically 30 mg tablets. Titration was supposed to start on the 15th. Select reason for no triage. ---Other Please document clinical information provided and list any resource used. ---Needing clarification about phenobarbitol dosing/schedule. Need loading dose since had a seizure? Nurse: Charlcie Cradle, RN, Alcario Drought Date/Time (Eastern Time): 01/15/2017 1:07:59 PM Please select the assessment type ---Verbal order / New medication order Additional Documentation ---Phenobarbital Does the client directives allow for assistance with medications after hours? ---Yes Other current medications? ---Yes List current medications. ---buspirone, aspirin, azelestine, vit D, fluticasone, metoprolol, omega 3, omperazole, tramadol, tylenol, momatasone, inzo, depacote 250 HS (supposed to end on the 24th), phenobarbitol 30 mgs (supposed to take 30mg  in am for 1 week 108for 1 week HS, then do 30 mg BID for 1 week, then just 30mg  HS for one week and then stop) Medication allergies? ---Yes PLEASE NOTE: All timestamps contained within this report are represented as [place] Standard Time. CONFIDENTIALTY NOTICE: This fax transmission is intended only for the addressee. It contains information that is legally privileged, confidential or otherwise protected from use or disclosure. If you are not the intended recipient, you are strictly prohibited from reviewing, disclosing, copying using or disseminating any of this information or taking any action in reliance on or regarding this information. If you have received this fax in error, please notify us immediately by telephone so that we can arrange for its return to [place]. Phone: [phone], Toll-Free: [phone], Fax: [phone] Page: 2 of 2 Call Id:  [id number] Nurse Assessment List medication allergies. ---anti fungal cream,  amlodipine, codiene, hydrocodone, oxy, lovastatin, cetralize, simvistatin, tape, latex, sulfa, PCN, besalate, TDap Pharmacy name and phone number. ---Autumn Messing phone [phone] Does the client directive allow for RN to call in the medication order to the pharmacy? ---No Guidelines Guideline Title Affirmed Question Disp. Time Lamount Cohen Time) Disposition Final User 01/15/2017 2:15:41 PM Clinical Call Yes Axmacher, RN, Alcario Drought Comments User: Horton Marshall, RN Date/Time Lamount Cohen Time): 01/15/2017 1:12:18 PM RN Director on site at [employer] is [place], phone number [phone] User: Horton Marshall, RN Date/Time Lamount Cohen Time): 01/15/2017 1:22:40 PM Call to Nita Sickle [phone]) RN Director at [facility], no answer, message left to call the call center back User: Horton Marshall, RN Date/Time Lamount Cohen Time): 01/15/2017 1:42:47 PM Received call back from [facility] RN at [facility] but was on urgent call, attempted call back but no answer, left voice mail to please call me back. User: Horton Marshall, RN Date/Time Lamount Cohen Time): 01/15/2017 2:05:14 PM 2nd attempt made to call back Nita Sickle, Emergency planning/management officer at [employer], no answer, no voice mail left. User: Horton Marshall, RN Date/Time Lamount Cohen Time): 01/15/2017 2:07:22 PM Call placed to Legrand Como, RN with hospice who placed the call initially in order to see if she knows who wrote the phenobarbital order and if the pt has a neurologist following her, no answer, left message User: Horton Marshall, RN Date/Time Lamount Cohen Time): 01/15/2017 2:15:19 PM Call back from Legrand Como, RN with Hospice, states that she spoke with the pt's dtr about this event and that the dtr told her that the pt sees a neurologist and that it is the neurologist who wrote these orders. States that she informed Thersea the Emergency planning/management officer at [facility] that she needs to get in touch  with the neurologist regarding this matter. Relayed to Lanora Manis that our MD on call felt the same, verb understanding Paging DoctorName Phone DateTime Action Result/Outcome Notes Lezlie Octave - MD [phone] 01/15/2017 1:11:27 PM Doctor Paged Paged On Call Back to Call Center Please call Alcario Drought, RN with the [facility] at [phone]. Thank you! Lezlie Octave - MD 01/15/2017 1:20:35 PM Message Result Spoke with On Call - General Spoke with MD on call Dr. Beverely Low states that she wants staff to page the neurologist on call, she doesn't feel like Dr. Milinda Antis would be titrating phenobarbitol and since pt had a break through seizure she is not comfortable adjusting this rx.

## 2017-01-17 NOTE — Telephone Encounter (Signed)
[facility] req new px for buspar  Printed and in IN box

## 2017-01-17 NOTE — Telephone Encounter (Signed)
Spoke with Raynelle Fanning at hospice care.  She states that pt was without her phenobarbital wed, thurs, fri and sat last week and experienced a seizure on Saturday.  Raynelle Fanning was asking about tapering, and if she should still follow the Rx instructions given that pt was without it for so long.  Per Dr. Karel Jarvis, yes, taper with same instructions.  Relayed message to Raynelle Fanning who who verbalized understanding

## 2017-01-17 NOTE — Telephone Encounter (Signed)
Hospice called and said PT had a seizure over the weekend  and has a question about the medication phenobarbital and if Dr Karel Jarvis wants to start her on CB# [phone]

## 2017-01-18 NOTE — Telephone Encounter (Signed)
Rx with order form faxed to [facility]

## 2017-01-24 ENCOUNTER — Telehealth: Payer: Self-pay | Admitting: Neurology

## 2017-01-24 NOTE — Telephone Encounter (Signed)
Spoke with Rosey Bath.  She says that pt is lethargic and has been over the weekend, per family.  There was a hiccup in tapering the Phenobarbital and titrating her Depakote - pt went without phenobarbital for 4 days then had a seizure.  Starting today pt will receive 30mg  phenobarbital in the am, and 60 mg this evening.  Rosey Bath is thinking that pt is getting too much of both medications at this time.  She sees that if things had gone smoothly, this most likely wouldn't be an issue today.   She is asking if there is anyway to speed up the taper of the phenobarbital?

## 2017-01-24 NOTE — Telephone Encounter (Signed)
Spoke with Rosey Bath.  Per Dr. Karel Jarvis, OK to taper every 3 days.  Will send written orders.

## 2017-01-24 NOTE — Telephone Encounter (Signed)
Rosey Bath with [facility] called in regards to PT and the medication she is taking CB# [phone]

## 2017-01-25 ENCOUNTER — Encounter (HOSPITAL_COMMUNITY): Payer: Self-pay | Admitting: Emergency Medicine

## 2017-01-25 ENCOUNTER — Emergency Department (HOSPITAL_COMMUNITY)
Admission: EM | Admit: 2017-01-25 | Discharge: 2017-01-25 | Disposition: A | Discharge status: Home / Self Care | Attending: Emergency Medicine | Admitting: Emergency Medicine

## 2017-01-25 DIAGNOSIS — Z7982 Long term (current) use of aspirin: Secondary | ICD-10-CM | POA: Diagnosis not present

## 2017-01-25 DIAGNOSIS — I10 Essential (primary) hypertension: Secondary | ICD-10-CM | POA: Diagnosis not present

## 2017-01-25 DIAGNOSIS — F0390 Unspecified dementia without behavioral disturbance: Secondary | ICD-10-CM | POA: Diagnosis not present

## 2017-01-25 DIAGNOSIS — T423X1A Poisoning by barbiturates, accidental (unintentional), initial encounter: Secondary | ICD-10-CM | POA: Diagnosis not present

## 2017-01-25 DIAGNOSIS — Z91040 Latex allergy status: Secondary | ICD-10-CM | POA: Insufficient documentation

## 2017-01-25 DIAGNOSIS — R9431 Abnormal electrocardiogram [ECG] [EKG]: Secondary | ICD-10-CM | POA: Diagnosis not present

## 2017-01-25 DIAGNOSIS — R4182 Altered mental status, unspecified: Secondary | ICD-10-CM | POA: Diagnosis not present

## 2017-01-25 DIAGNOSIS — R402421 Glasgow coma scale score 9-12, in the field [EMT or ambulance]: Secondary | ICD-10-CM | POA: Diagnosis not present

## 2017-01-25 DIAGNOSIS — Z79899 Other long term (current) drug therapy: Secondary | ICD-10-CM | POA: Insufficient documentation

## 2017-01-25 LAB — BASIC METABOLIC PANEL
ANION GAP: 8 (ref 5–15)
BUN: 20 mg/dL (ref 6–20)
CALCIUM: 9.4 mg/dL (ref 8.9–10.3)
CO2: 27 mmol/L (ref 22–32)
Chloride: 105 mmol/L (ref 101–111)
Creatinine, Ser: 0.89 mg/dL (ref 0.44–1.00)
GFR calc Af Amer: >60 mL/min (ref >60)
GFR, EST NON AFRICAN AMERICAN: 58 mL/min — AB (ref >60)
GLUCOSE: 101 mg/dL — AB (ref 65–99)
Potassium: 4.2 mmol/L (ref 3.5–5.1)
Sodium: 140 mmol/L (ref 135–145)

## 2017-01-25 LAB — CBC WITH DIFFERENTIAL/PLATELET
BASOS ABS: 0.1 10*3/uL (ref 0.0–0.1)
Basophils Relative: 1 %
EOS PCT: 9 %
Eosinophils Absolute: 0.6 10*3/uL (ref 0.0–0.7)
HCT: 42.6 % (ref 36.0–46.0)
Hemoglobin: 13.9 g/dL (ref 12.0–15.0)
LYMPHS PCT: 25 %
Lymphs Abs: 1.6 10*3/uL (ref 0.7–4.0)
MCH: 30 pg (ref 26.0–34.0)
MCHC: 32.6 g/dL (ref 30.0–36.0)
MCV: 92 fL (ref 78.0–100.0)
MONO ABS: 0.6 10*3/uL (ref 0.1–1.0)
Monocytes Relative: 9 %
Neutro Abs: 3.6 10*3/uL (ref 1.7–7.7)
Neutrophils Relative %: 56 %
PLATELETS: 265 10*3/uL (ref 150–400)
RBC: 4.63 MIL/uL (ref 3.87–5.11)
RDW: 15 % (ref 11.5–15.5)
WBC: 6.3 10*3/uL (ref 4.0–10.5)

## 2017-01-25 LAB — VALPROIC ACID LEVEL: VALPROIC ACID LVL: 25 ug/mL — AB (ref 50.0–100.0)

## 2017-01-25 LAB — PHENOBARBITAL LEVEL: Phenobarbital: 43 ug/mL — ABNORMAL HIGH (ref 15.0–30.0)

## 2017-01-25 NOTE — ED Triage Notes (Signed)
Pt arrives via EMs from [facility] where pt was found this morning unresponsive and more lethargic than baseline. Pt arrival arouses to voice, oriented to self only, denies pain.

## 2017-01-25 NOTE — Discharge Instructions (Signed)
Hold phenobarbital for now.  Re-draw level daily until back within normal range-- once within normal range, will need to re-start taper of phenobarbital.  Can discussed with Dr. Karel Jarvis for dosing recommendations. Continue depakote at scheduled. Follow-up with your primary care doctor. Return here for any new/worsening symptoms.

## 2017-01-25 NOTE — Progress Notes (Addendum)
[facility] Hospital Liaison:  RN visit  Received call from [place], [facility] RN.  Patient was brought in by EMS after [facility] found patient to be more lethargic.  Per daughter, patient has been lethargic since she started seizure medication.  Patient's family advised that they would like to treat the treatable.  Spoke with Misty Stanley, PA-C, plan to check medication levels and send back to [facility].   Thank you,  Adele Barthel, RN, BSN [facility] Liaison [phone]  All hospital liaisons are now on AMION.   **UPDATE** Did verify for Misty Stanley, PA-C, that phenobarbital levels can be gotten from patient if order in to do so.  I verified the information with Madaline Guthrie, Director and Joylene Igo, Direction with [facility].

## 2017-01-25 NOTE — ED Notes (Signed)
Pt left ED with son and daughter in private vehicle, report called to [facility] med tech who verbalized understanding of discharge instructions.

## 2017-01-25 NOTE — ED Provider Notes (Signed)
[facility]-EMERGENCY DEPT Provider Note   CSN: [id number] Arrival date & time: 01/25/17  0941     History   Chief Complaint Chief Complaint  Patient presents with  . Altered Mental Status    HPI Amber Rollins is a 83 y.o. female.  The history is provided by the patient and medical records.    Level V caveat: dementia 83 y.o. F with hx of HLP, HTN, OA, osteopenia, seizures, presenting to the ED with altered mental status. Patient is under hospice care of hospice care [facility] for her dementia and overall decline. Staff at the facility reports patient has been altered for a few days now. States she has been less responsive than normal.  Per EMS [facility] has been at [facility] for a few months now.  Fall 2 weeks ago, no evaluated then as family did not want her sent to ED.  Reports seizure last week-- not exactly sure why.  Has been adjusting medications at facility. No fevers reported.  VS have been stable en route.  Able to say her name but not much else.  Past Medical History:  Diagnosis Date  . Chronic foot pain    mortons neuroma  . Complication of anesthesia   . Cystocele 01/01   neg. sx  . fracture fibula ? 2003   left  . GERD (gastroesophageal reflux disease)   . Hemorrhoids   . Hyperlipemia 05/31/1992  . Hypertension 05/31/2000  . Menopausal symptoms    vasomotor symptoms-severe  . Osteoarthritis   . Osteopenia 11/29/1999  . Pedal edema    worsened by Norvasc  . Plantar fasciitis   . PONV (postoperative nausea and vomiting)   . Seizures [facility])     Patient Active Problem List   Diagnosis Date Noted  . UTI (urinary tract infection) 12/20/2016  . History of CVA (cerebrovascular accident) 12/20/2016  . Failure to thrive in adult 12/15/2016  . Complex partial seizure (HCC) 12/11/2016  . Dementia 12/11/2016  . Foot pain, bilateral 08/23/2016  . Hyponatremia 01/01/2016  . Routine general medical examination at a health care facility 11/10/2015  . Urinary  frequency 10/23/2014  . Generalized weakness 10/23/2014  . Hammer toe 09/20/2014  . Herpetic dermatitis 08/09/2014  . Poor balance 04/11/2013  . Thoracic back pain 04/11/2013  . Urinary incontinence 12/27/2012  . Thoracic kyphosis 09/24/2012  . Fall 08/09/2012  . Hemorrhoid 12/24/2011  . Low back pain 12/24/2011  . Hypokalemia 10/06/2010  . HEMATURIA UNSPECIFIED 10/02/2009  . GERD 06/18/2009  . INCONTINENCE, URGE 06/18/2009  . FATIGUE 03/12/2009  . HYPOKALEMIA 10/08/2008  . MENOPAUSAL SYNDROME 01/22/2008  . EDEMA 07/19/2007  . Anxiety and depression 06/21/2007  . FOOT PAIN, CHRONIC 03/06/2007  . ALLERGIC RHINITIS, SEASONAL 09/12/2006  . IRRITABLE BOWEL SYNDROME 09/12/2006  . FIBROCYSTIC BREAST DISEASE 09/12/2006  . OSTEOARTHRITIS 09/12/2006  . HEMORRHOIDS, HX OF 09/12/2006  . Essential hypertension 05/31/2000  . Disorder of bone and cartilage 11/29/1999  . Hyperlipidemia 05/31/1992    Past Surgical History:  Procedure Laterality Date  . Anterior repari w//surg proc  06/30/99   RSO  . BACK SURGERY    . BLADDER SUSPENSION    . BLADDER TACK    . CATARACTS    . Foot problems     neuromas  . history of CT     mass right adnexa, U?S by GYN-observation  . KYPHOPLASTY N/A 09/04/2012   Procedure: T7, T8,T12 Kyphoplasty ;  Surgeon: Clydene Fake, MD;  Location: [facility];  Service: Neurosurgery;  Laterality: N/A;  thoracic seven,eight, and twelve   . PARTIAL HYSTERECTOMY  1970   endometriosis    OB History    No data available       Home Medications    Prior to Admission medications   Medication Sig Start Date End Date Taking? Authorizing Provider  acetaminophen (TYLENOL) 325 MG tablet Take 650 mg by mouth every 4 (four) hours as needed (for pain).     [provider]  aspirin 81 MG chewable tablet Chew 1 tablet (81 mg total) by mouth daily. 12/11/16   Albertine Grates, MD  azelastine (ASTELIN) 0.1 % nasal spray Place 2 sprays into both nostrils 2 (two) times  daily. Use in each nostril as directed    [provider]  busPIRone (BUSPAR) 15 MG tablet Take 1 tablet (15 mg total) by mouth 2 (two) times daily. 01/17/17   Tower, Audrie Gallus, MD  Cholecalciferol (VITAMIN D3) 2000 UNITS TABS Take 1 tablet by mouth daily with breakfast.     [provider]  divalproex (DEPAKOTE ER) 250 MG 24 hr tablet Take 1 tablet at night for 1 week, then increase to 2 tablets at night 01/11/17   Van Clines, MD  fluticasone (FLONASE) 50 MCG/ACT nasal spray PLACE 1 TO 2 SPRAYS IN EACH NOSTRIL DAILY. 02/27/16   Tower, Audrie Gallus, MD  metoprolol tartrate (LOPRESSOR) 25 MG tablet Take 0.5 tablets (12.5 mg total) by mouth 2 (two) times daily. 12/10/16 12/10/17  Albertine Grates, MD  mometasone (ELOCON) 0.1 % cream Apply 1 application topically daily as needed.    [provider]  omega-3 acid ethyl esters (LOVAZA) 1 g capsule Take 1 g by mouth daily.    [provider]  omeprazole (PRILOSEC) 20 MG capsule TAKE 1 CAPSULE BY MOUTH 2 TIMES DAILY. 06/21/16   Tower, Audrie Gallus, MD  PHENObarbital (LUMINAL) 60 MG tablet Take 1 tablet (60 mg total) by mouth 2 (two) times daily. 12/20/16   Tower, Audrie Gallus, MD  traMADol (ULTRAM) 50 MG tablet Take 50 mg by mouth every 6 (six) hours as needed.    [provider]    Family History Family History  Problem Relation Age of Onset  . Diabetes Brother   . Hypertension Other   . Diabetes Mother   . Heart disease Mother        pacemaker  . Hypertension Mother   . COPD Father   . Diabetes Sister   . Cancer - Lung Sister   . Mental illness Son        anxiety-mental health problems  . Diabetes Brother   . Diabetes Sister   . Diabetes Sister     Social History Social History  Substance Use Topics  . Smoking status: Never Smoker  . Smokeless tobacco: Never Used  . Alcohol use No     Allergies   Metronidazole; Penicillins; Tdap [diphth-acell pertussis-tetanus]; Amlodipine besylate; Codeine; Hydrocodone;  Lovastatin; Oxycodone; Sertraline hcl; Simvastatin; Tape; Latex; and Sulfonamide derivatives   Review of Systems Review of Systems  Unable to perform ROS: Dementia     Physical Exam Updated Vital Signs BP (!) 172/73 (BP Location: Right Arm)   Pulse 79   Temp 98.9 F (37.2 C) (Rectal)   Resp 19   SpO2 96%   Physical Exam  Constitutional: She appears well-developed and well-nourished.  Curled up into fetal position on right side on ED stretcher, awake, alert  HENT:  Head: Normocephalic and atraumatic.  Mouth/Throat: Oropharynx is clear and moist.  Eyes: Pupils are equal, round, and reactive to light. Conjunctivae and EOM are normal.  Neck: Normal range of motion.  Cardiovascular: Normal rate, regular rhythm and normal heart sounds.   Pulmonary/Chest: Effort normal and breath sounds normal. No respiratory distress. She has no wheezes.  Abdominal: Soft. Bowel sounds are normal.  Musculoskeletal:  Not moving arms or legs spontaneously, no visible effort when attempting to move her on ED stretcher  Neurological: She is alert.  Awake, able to state her name but does not answer any other questions, no witnessed movement of the arms/legs here  Skin: Skin is warm and dry.  Psychiatric: She has a normal mood and affect.  Nursing note and vitals reviewed.    ED Treatments / Results  Labs (all labs ordered are listed, but only abnormal results are displayed) Labs Reviewed - No data to display  EKG  EKG Interpretation None       Radiology No results found.  Procedures Procedures (including critical care time)  Medications Ordered in ED Medications - No data to display   Initial Impression / Assessment and Plan / ED Course  I have reviewed the triage vital signs and the nursing notes.  Pertinent labs & imaging results that were available during my care of the patient were reviewed by me and considered in my medical decision making (see chart for details).  83 y.o. F  here with AMS.  History very limited given hx of dementia and new AMS.  Patient does appear lethargic here, not really moving around too much.  She answers limited questions.  VSS.  Will try and contact patient's daughter to discuss goals of care as patient is on hospice with active DNR and MOST form.  10:10 AM Patient's daughter, Amber Rollins, who is her healthcare power of attorney has arrived in ED. She reports that patient goals of care at this time are for comfort mostly. She does have concern that she has been over medicated recently as they are trying to wean her off phenobarbital but have also started her on Depakote.  States there was a "miscommunication" at hospice a few days ago and she didn't get any medications for about 4 days and had a seizure but no other acute events since then.  No fever/chills reported, VS were normal this morning.  I have discussed at least obtaining levels of seizure meds as these can become toxic as well as basic labs, daughter has agreed to this much right now.  Wants up to speak with hospice RN before further testing.  Feel this is reasonable.  10:15 AM Spoke with Geraldine Contras, patient's hospice RN-- has been taking care of her this week while her usual RN Raynelle Fanning is on vacation--- reports patient has been very sleepy.  Reports she had seizure last week when medications were missed and has been "off" since then-- very slow to respond, somewhat confused.  States she was altered yesterday pretty much how she is here.  Re-started phenobarbital for 1 week then re-start taper this week.  Agrees with work-up thus far.    Patient's labs overall reassuring. Her phenobarbital level is high at 43. Suspect this is the cause of her increased lethargy and altered mental status.  I have discussed the results with patient's family. They acknowledged understanding.  Hospice liaison here has spoken with nursing management at [facility]-- they can arrange for repeat phenobarbital level to be drawn  at [facility] as to avoid recurrent  admission.  Daughter in agreement with this.  Will have staff hold phenobarbital for now, can continue depakote. Will have phenobarbital levels re-drawn every 24 hours until back to normal limits.  Can discuss with Dr. Karel Jarvis about dosing for re-starting her taper once levels WNL.  Copies of labs sent for physician review.  Plan discussed with patient's daughter, she is able with plan of care. She will take patient back to [facility].  Patient seen and evaluated with attending physician, Dr. Jeraldine Loots, who evaluated patient and agrees with assessment and plan of care.  Final Clinical Impressions(s) / ED Diagnoses   Final diagnoses:  Altered mental status, unspecified altered mental status type  Poisoning by phenobarbital, accidental or unintentional, initial encounter    New Prescriptions New Prescriptions   No medications on file     Oletha Blend 01/25/17 1259    Gerhard Munch, MD 01/26/17 2135

## 2017-01-26 DIAGNOSIS — Z79899 Other long term (current) drug therapy: Secondary | ICD-10-CM | POA: Diagnosis not present

## 2017-01-27 ENCOUNTER — Telehealth: Payer: Self-pay

## 2017-01-27 ENCOUNTER — Telehealth: Payer: Self-pay | Admitting: Neurology

## 2017-01-27 ENCOUNTER — Encounter: Payer: Self-pay | Admitting: Internal Medicine

## 2017-01-27 DIAGNOSIS — E559 Vitamin D deficiency, unspecified: Secondary | ICD-10-CM | POA: Diagnosis not present

## 2017-01-27 DIAGNOSIS — E039 Hypothyroidism, unspecified: Secondary | ICD-10-CM | POA: Diagnosis not present

## 2017-01-27 DIAGNOSIS — D649 Anemia, unspecified: Secondary | ICD-10-CM | POA: Diagnosis not present

## 2017-01-27 DIAGNOSIS — B1710 Acute hepatitis C without hepatic coma: Secondary | ICD-10-CM | POA: Diagnosis not present

## 2017-01-27 NOTE — Telephone Encounter (Signed)
I am not there to sign an order unfortunately.  I think she can stop all but the seizure medication.  (if family wants her to stop the seizure medicine that is ok - but it will pt her at risk of seizure)

## 2017-01-27 NOTE — Telephone Encounter (Signed)
Spoke with Amber Rollins with [facility].  She state that pt has a change in condition, can no longer swallow.  Asked if we can send orders for sublingual Ativan or Lorazepam - spoke with Dr. Allena KatzPatel (as Dr. Karel JarvisAquino is out of the office) Dr Allena KatzPatel stated that Hospice should have their own protocol for these issues.    I saw that pt was seen in the ED on Tuesday due to Phenobarbital poisoning.  I called her daughter, Amber Rollins, who states that Tuesday morning pt was non responsive and taken to the ER.  She is frustrated because hospice is now wanting to introduce yet another drug to pt, instead of just trying to obtain liquid Depakote.  Also states that pt is not agitated at this time and hasn't been in a while.  She states that she is worried about adding another medication as there were so many problems in the past, which resulted in pt's recent ER visit.    While on the phone with Amber SoPhyllis I saw that Amber Rollins had spoken to Dr. Royden Purlower's office (pt's PCP) who OK'd to discontinue all medications except seizure meds.

## 2017-01-27 NOTE — Telephone Encounter (Signed)
Amber Rollins from the [facility] called. She said the Results are  37.1 they need to know does it need to be re tested? Phenobarbital medication? Please Call. Thanks

## 2017-01-27 NOTE — Telephone Encounter (Signed)
Spoke with Amber Rollins, she states that they have already re-tested pt.  Did not give results.  Will need an order to not retest, or to test Depakote levels.

## 2017-01-27 NOTE — Telephone Encounter (Signed)
Amber Rollins called from Hospice [phone] regarding Amber Rollins and to say that she has had a change in condition. Please Call. Thanks

## 2017-01-27 NOTE — Telephone Encounter (Signed)
Spoke to ConcordiaKeisha and she said they are letting pt's neurologist handle her seizure medication and deciding if they want to stop it or not. Deanna ArtisKeisha is okay with you filling out the order tomorrow when you return. They are going to fax the form over today for you to fill out tomorrow. I did advise Deanna ArtisKeisha of Dr. Royden Purlower's comments

## 2017-01-27 NOTE — Telephone Encounter (Signed)
Since we are reducing the dose, no need to reduce. If she is still drowsy, would actually check the Depakote level. Thanks

## 2017-01-27 NOTE — Telephone Encounter (Signed)
[facility]; pt has had significant change; pt not eating or drinking; more difficulty in swallowing; Deanna ArtisKeisha request to review med list and see if can stop majority of pts meds. [facility] Pt is at assisted facility [facility] and will need signed order to stop any meds faxed to [facility].

## 2017-01-28 ENCOUNTER — Telehealth: Payer: Self-pay | Admitting: Neurology

## 2017-01-28 DIAGNOSIS — E559 Vitamin D deficiency, unspecified: Secondary | ICD-10-CM | POA: Diagnosis not present

## 2017-01-28 DIAGNOSIS — E785 Hyperlipidemia, unspecified: Secondary | ICD-10-CM | POA: Diagnosis not present

## 2017-01-28 DIAGNOSIS — Z79899 Other long term (current) drug therapy: Secondary | ICD-10-CM | POA: Diagnosis not present

## 2017-01-28 DIAGNOSIS — E039 Hypothyroidism, unspecified: Secondary | ICD-10-CM | POA: Diagnosis not present

## 2017-01-28 NOTE — Telephone Encounter (Signed)
Noted. Would not prescribe Ativan (?for what reason). There is a liquid form of Depakote if swallowing pills is the concern. Hospice physician should be contacted for comfort medications, if this was the reason Ativan was needed. Thanks

## 2017-01-28 NOTE — Telephone Encounter (Signed)
Form faxed and sent for scanning.

## 2017-01-28 NOTE — Telephone Encounter (Signed)
Please advise 

## 2017-01-28 NOTE — Telephone Encounter (Signed)
Form signed and in IN box

## 2017-01-28 NOTE — Telephone Encounter (Signed)
Hospice is needing to speak with you regarding this patient and her not being able to swallow the liquid Depakote medication. They also said that she is actively transitioning. Please Call. Thanks

## 2017-01-28 NOTE — Telephone Encounter (Signed)
Called number listed on phone message [phone], no answer, no VM set up. Also called number from prior phone note [phone], no answer.

## 2017-02-15 ENCOUNTER — Ambulatory Visit: Payer: PPO | Admitting: Podiatry

## 2017-02-28 DEATH — deceased

## 2017-07-19 ENCOUNTER — Ambulatory Visit: Payer: Self-pay | Admitting: Neurology

## 2018-01-20 IMAGING — MR MR HEAD W/O CM
9 series · 47 of 48 positions shown · non-contrast
Comparison: CT 12/08/2016

CLINICAL DATA: Episode of loss of consciousness.  Confusion.

EXAM:
MRI HEAD WITHOUT CONTRAST
TECHNIQUE: Multiplanar, multiecho pulse sequences of the brain and surrounding
structures were obtained without intravenous contrast.

[Series 3: T1 · sagittal · 5.0mm · 0.47mm/px · 1 of 24 slices shown]
[im 1/24]
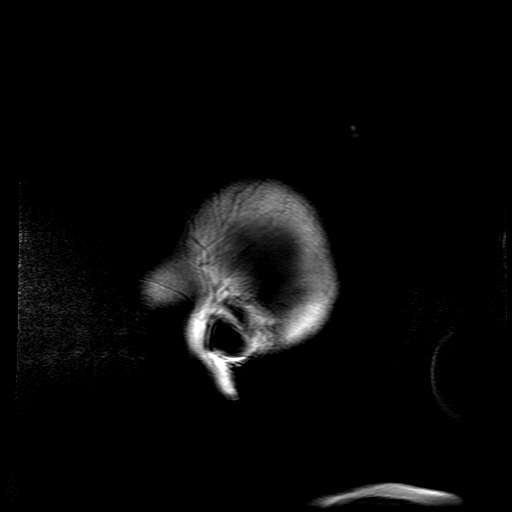

[Series 4: DWI · axial · 3.0mm · 1.09mm/px · z∈[-50,+84]mm · 11 of 92 slices shown (1 of 6)]
[im 1/92]
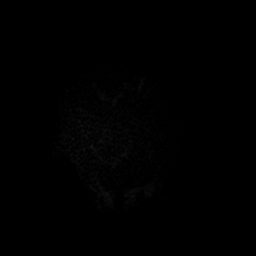
[im 10/92]
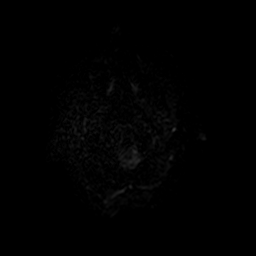
[im 19/92]
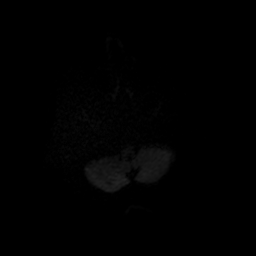
[im 28/92]
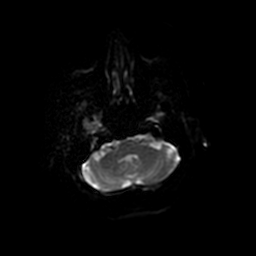
[im 37/92]
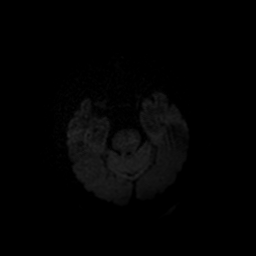
[im 46/92]
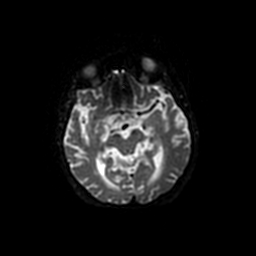
[im 55/92]
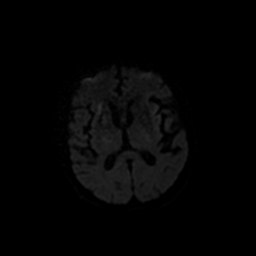
[im 64/92]
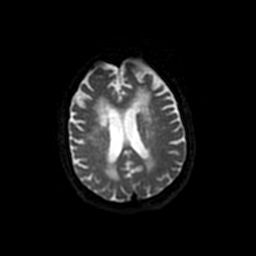
[im 73/92]
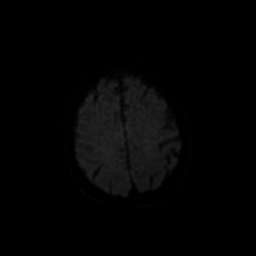
[im 82/92]
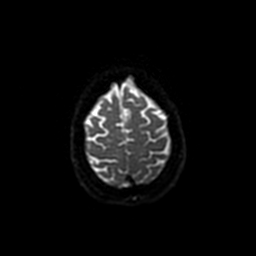
[im 92/92]
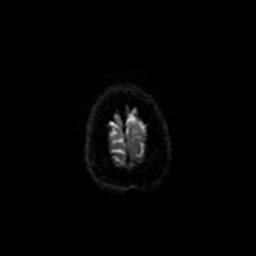

[Series 5: DWI · coronal · 5.0mm · 1.09mm/px · 8 of 66 slices shown (2 of 6)]
[im 1/66]
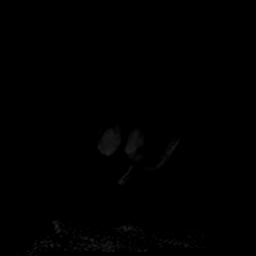
[im 10/66]
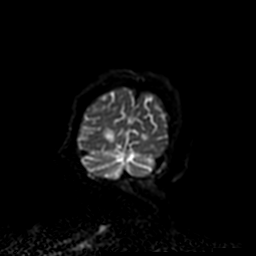
[im 19/66]
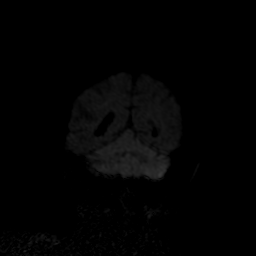
[im 28/66]
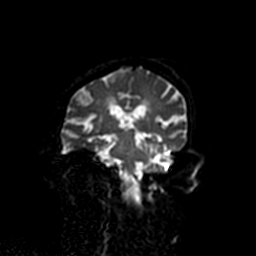
[im 38/66]
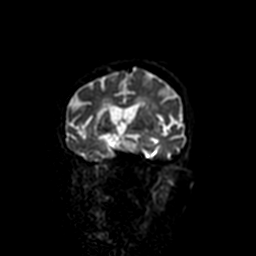
[im 47/66]
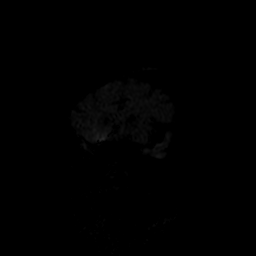
[im 56/66]
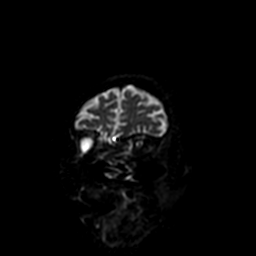
[im 66/66]
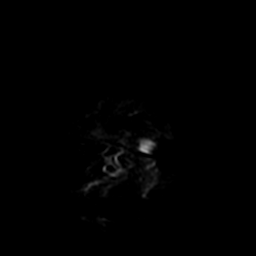

[Series 6: T2 · axial · 5.0mm · 0.43mm/px · z∈[-46,+100]mm · 3 of 26 slices shown]
[im 1/26]
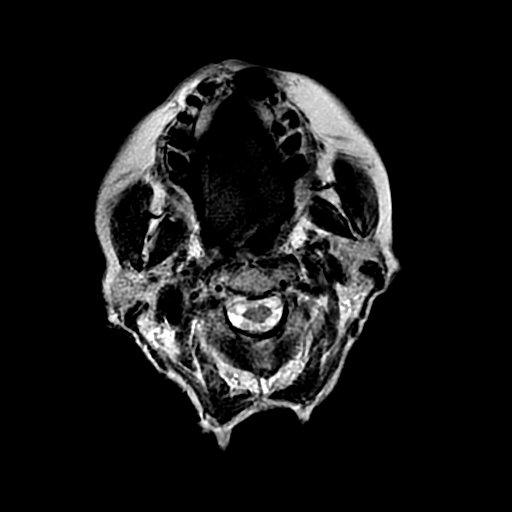
[im 13/26]
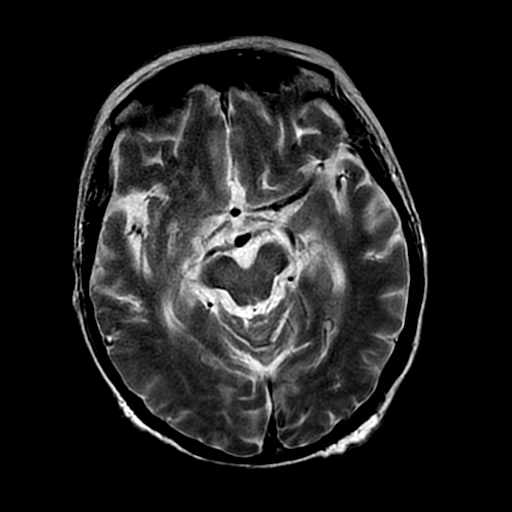
[im 26/26]
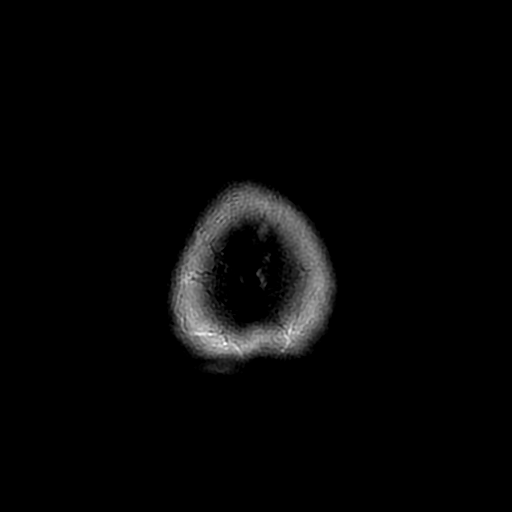

[Series 7: DWI · axial · 3.0mm · 1.09mm/px · z∈[-16,+79]mm · 8 of 66 slices shown (3 of 6)]
[im 1/66]
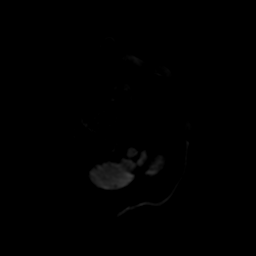
[im 10/66]
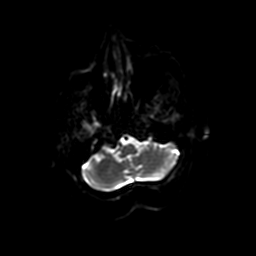
[im 19/66]
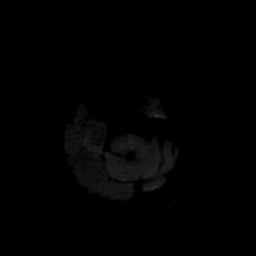
[im 28/66]
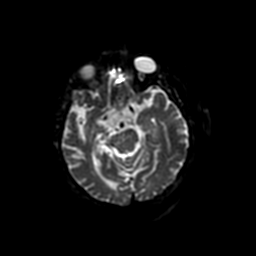
[im 38/66]
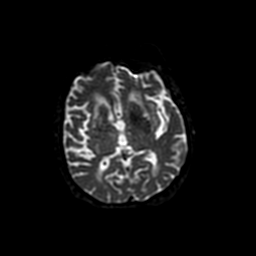
[im 47/66]
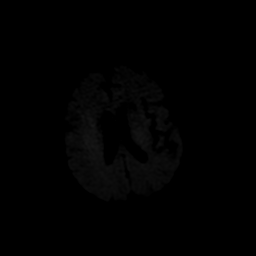
[im 56/66]
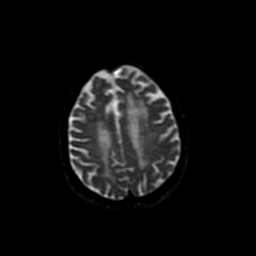
[im 66/66]
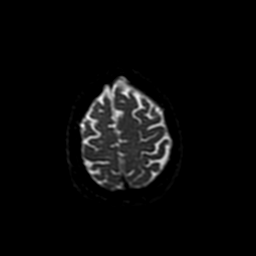

[Series 8: FLAIR · axial · 5.0mm · 0.43mm/px · z∈[-46,+100]mm · 3 of 26 slices shown]
[im 1/26]
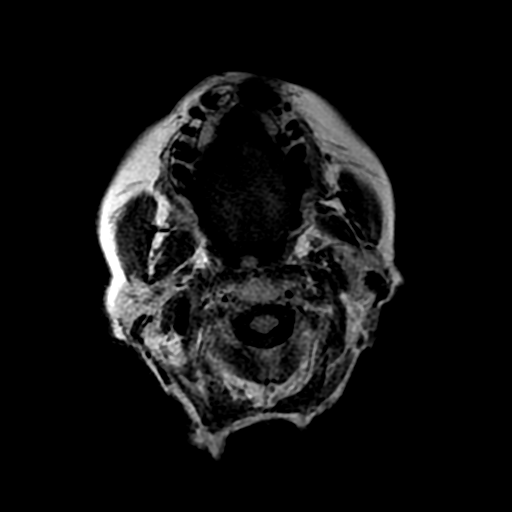
[im 13/26]
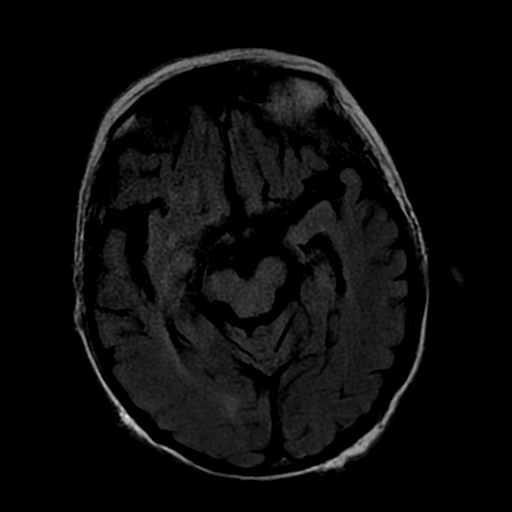
[im 26/26]
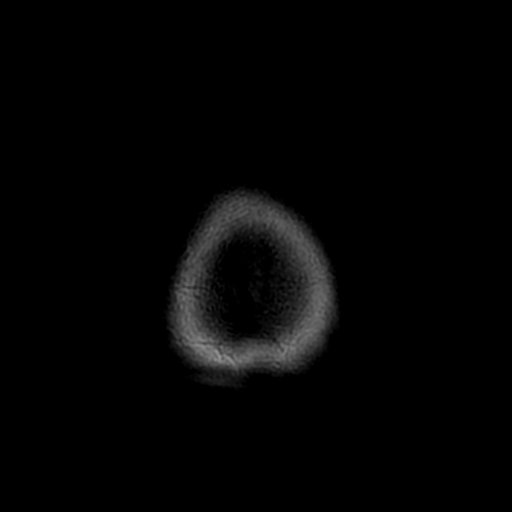

[Series 400: DWI · axial · 3.0mm · 1.09mm/px · z∈[-50,+84]mm · 5 of 46 slices shown (4 of 6)]
[im 1/46]
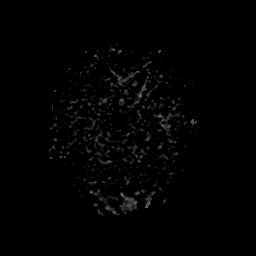
[im 12/46]
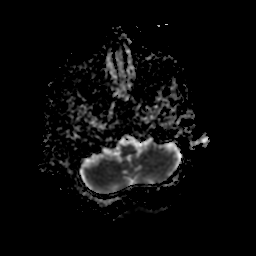
[im 23/46]
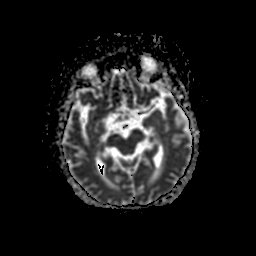
[im 34/46]
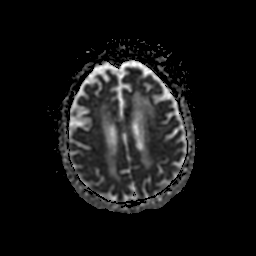
[im 46/46]
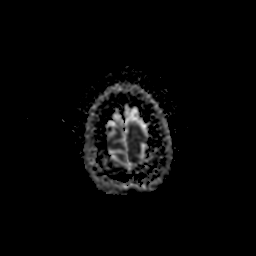

[Series 500: DWI · coronal · 5.0mm · 1.09mm/px · 4 of 33 slices shown (5 of 6)]
[im 1/33]
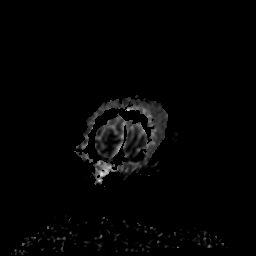
[im 11/33]
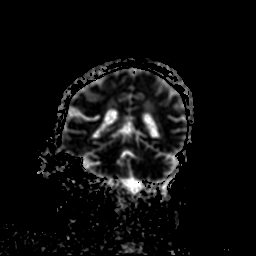
[im 22/33]
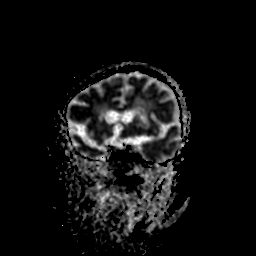
[im 33/33]
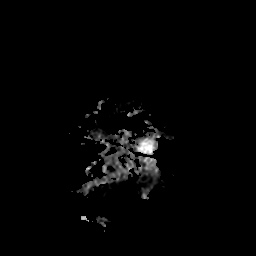

[Series 700: DWI · axial · 3.0mm · 1.09mm/px · z∈[-16,+79]mm · 4 of 33 slices shown (6 of 6)]
[im 1/33]
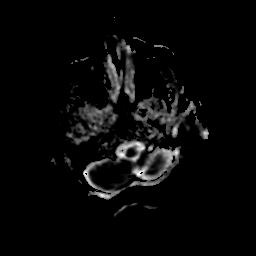
[im 11/33]
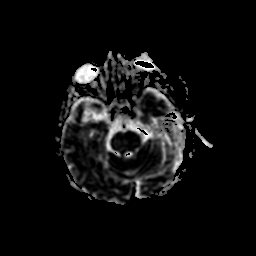
[im 22/33]
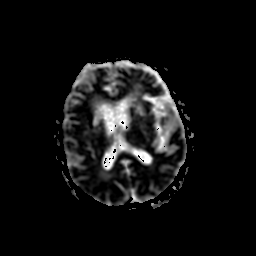
[im 33/33]
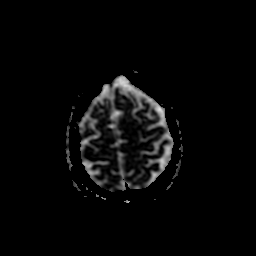

[47 of 48 positions shown; findings below may reference images not displayed]

FINDINGS: Brain: Study suffers from some motion degradation. Diffusion imaging
shows a punctate focus of acute infarction in the left posterior
temporal/occipital junction region. No other acute infarction is
seen. There chronic small-vessel ischemic changes throughout the
pons. No focal cerebellar insult. Cerebral hemispheres show
confluent chronic small vessel ischemic changes affecting the white
matter. There are old infarctions in the basal gangliar regions.
Mild small vessel change affects the thalami. No mass lesion, acute
hemorrhage, hydrocephalus or extra-axial collection.

Vascular: Major vessels at the base of the brain show flow.

Skull and upper cervical spine: Negative

Sinuses/Orbits: Clear/normal

Other: none significant
IMPRESSION: Background pattern of brain atrophy with extensive chronic
small-vessel ischemic changes throughout the brain. Old bilateral
basal ganglia infarctions. Punctate acute infarction in the left
posterior temporal lobe/occipital lobe junction region.
